# Patient Record
Sex: Male | Born: 1935 | Race: White | Hispanic: No | Smoking: Former smoker
Health system: Southern US, Community
[De-identification: ages and names within clinical notes are randomized; demographics above are authoritative.]

## PROBLEM LIST (undated history)

## (undated) DIAGNOSIS — R270 Ataxia, unspecified: Secondary | ICD-10-CM

## (undated) DIAGNOSIS — R634 Abnormal weight loss: Secondary | ICD-10-CM

## (undated) DIAGNOSIS — F0390 Unspecified dementia without behavioral disturbance: Secondary | ICD-10-CM

## (undated) DIAGNOSIS — I2699 Other pulmonary embolism without acute cor pulmonale: Secondary | ICD-10-CM

## (undated) DIAGNOSIS — F03A0 Unspecified dementia, mild, without behavioral disturbance, psychotic disturbance, mood disturbance, and anxiety: Secondary | ICD-10-CM

## (undated) DIAGNOSIS — H548 Legal blindness, as defined in USA: Secondary | ICD-10-CM

## (undated) DIAGNOSIS — I10 Essential (primary) hypertension: Secondary | ICD-10-CM

## (undated) DIAGNOSIS — Z7901 Long term (current) use of anticoagulants: Secondary | ICD-10-CM

## (undated) DIAGNOSIS — I639 Cerebral infarction, unspecified: Secondary | ICD-10-CM

## (undated) DIAGNOSIS — G629 Polyneuropathy, unspecified: Secondary | ICD-10-CM

## (undated) DIAGNOSIS — W19XXXA Unspecified fall, initial encounter: Secondary | ICD-10-CM

## (undated) DIAGNOSIS — R296 Repeated falls: Secondary | ICD-10-CM

## (undated) DIAGNOSIS — I82409 Acute embolism and thrombosis of unspecified deep veins of unspecified lower extremity: Secondary | ICD-10-CM

## (undated) DIAGNOSIS — K3184 Gastroparesis: Secondary | ICD-10-CM

## (undated) DIAGNOSIS — M810 Age-related osteoporosis without current pathological fracture: Secondary | ICD-10-CM

## (undated) HISTORY — DX: Gastroparesis: K31.84

## (undated) HISTORY — DX: Essential (primary) hypertension: I10

## (undated) HISTORY — DX: Abnormal weight loss: R63.4

## (undated) HISTORY — DX: Polyneuropathy, unspecified: G62.9

## (undated) HISTORY — DX: Age-related osteoporosis without current pathological fracture: M81.0

## (undated) HISTORY — PX: ANKLE SURGERY: SHX546

---

## 1982-12-02 HISTORY — PX: TRANSURETHRAL RESECTION OF PROSTATE: SHX73

## 2002-10-01 ENCOUNTER — Ambulatory Visit (HOSPITAL_COMMUNITY): Admission: RE | Admit: 2002-10-01 | Discharge: 2002-10-01 | Payer: Self-pay | Admitting: Family Medicine

## 2002-10-01 ENCOUNTER — Encounter: Payer: Self-pay | Admitting: Family Medicine

## 2004-03-02 ENCOUNTER — Ambulatory Visit (HOSPITAL_COMMUNITY): Admission: RE | Admit: 2004-03-02 | Discharge: 2004-03-02 | Payer: Self-pay | Admitting: Family Medicine

## 2005-08-08 ENCOUNTER — Other Ambulatory Visit: Admission: RE | Admit: 2005-08-08 | Discharge: 2005-08-08 | Payer: Self-pay | Admitting: Dermatology

## 2006-11-16 ENCOUNTER — Inpatient Hospital Stay (HOSPITAL_COMMUNITY): Admission: EM | Admit: 2006-11-16 | Discharge: 2006-11-20 | Payer: Self-pay | Admitting: Emergency Medicine

## 2006-11-18 ENCOUNTER — Ambulatory Visit: Payer: Self-pay | Admitting: Orthopedic Surgery

## 2006-12-02 HISTORY — PX: BACK SURGERY: SHX140

## 2006-12-04 ENCOUNTER — Ambulatory Visit: Payer: Self-pay | Admitting: Orthopedic Surgery

## 2006-12-25 ENCOUNTER — Ambulatory Visit: Payer: Self-pay | Admitting: Orthopedic Surgery

## 2007-01-15 ENCOUNTER — Ambulatory Visit: Payer: Self-pay | Admitting: Orthopedic Surgery

## 2007-01-27 ENCOUNTER — Ambulatory Visit: Payer: Self-pay | Admitting: Orthopedic Surgery

## 2007-02-03 ENCOUNTER — Encounter (HOSPITAL_COMMUNITY): Admission: RE | Admit: 2007-02-03 | Discharge: 2007-03-05 | Payer: Self-pay | Admitting: Orthopedic Surgery

## 2007-03-03 ENCOUNTER — Ambulatory Visit: Payer: Self-pay | Admitting: Orthopedic Surgery

## 2007-03-06 ENCOUNTER — Encounter (HOSPITAL_COMMUNITY): Admission: RE | Admit: 2007-03-06 | Discharge: 2007-04-05 | Payer: Self-pay | Admitting: Orthopedic Surgery

## 2007-03-11 ENCOUNTER — Ambulatory Visit (HOSPITAL_COMMUNITY): Admission: RE | Admit: 2007-03-11 | Discharge: 2007-03-11 | Payer: Self-pay | Admitting: Family Medicine

## 2007-04-14 ENCOUNTER — Ambulatory Visit: Payer: Self-pay | Admitting: Orthopedic Surgery

## 2007-11-19 ENCOUNTER — Encounter (HOSPITAL_COMMUNITY): Admission: RE | Admit: 2007-11-19 | Discharge: 2007-12-02 | Payer: Self-pay | Admitting: Family Medicine

## 2007-12-04 ENCOUNTER — Encounter (HOSPITAL_COMMUNITY): Admission: RE | Admit: 2007-12-04 | Discharge: 2008-01-03 | Payer: Self-pay | Admitting: Family Medicine

## 2008-04-01 ENCOUNTER — Ambulatory Visit (HOSPITAL_COMMUNITY): Admission: RE | Admit: 2008-04-01 | Discharge: 2008-04-01 | Payer: Self-pay | Admitting: Rheumatology

## 2008-05-03 ENCOUNTER — Encounter: Admission: RE | Admit: 2008-05-03 | Discharge: 2008-05-03 | Payer: Self-pay | Admitting: Rheumatology

## 2008-05-17 ENCOUNTER — Encounter: Admission: RE | Admit: 2008-05-17 | Discharge: 2008-05-17 | Payer: Self-pay | Admitting: Rheumatology

## 2008-05-28 ENCOUNTER — Emergency Department (HOSPITAL_COMMUNITY): Admission: EM | Admit: 2008-05-28 | Discharge: 2008-05-28 | Payer: Self-pay | Admitting: Emergency Medicine

## 2008-05-30 ENCOUNTER — Encounter: Admission: RE | Admit: 2008-05-30 | Discharge: 2008-05-30 | Payer: Self-pay | Admitting: Rheumatology

## 2008-06-07 ENCOUNTER — Ambulatory Visit (HOSPITAL_COMMUNITY): Admission: RE | Admit: 2008-06-07 | Discharge: 2008-06-07 | Payer: Self-pay | Admitting: Family Medicine

## 2008-09-13 ENCOUNTER — Inpatient Hospital Stay (HOSPITAL_COMMUNITY): Admission: RE | Admit: 2008-09-13 | Discharge: 2008-09-15 | Payer: Self-pay | Admitting: Neurological Surgery

## 2010-06-28 ENCOUNTER — Ambulatory Visit: Payer: Self-pay | Admitting: Otolaryngology

## 2010-07-04 ENCOUNTER — Ambulatory Visit (HOSPITAL_COMMUNITY): Admission: RE | Admit: 2010-07-04 | Discharge: 2010-07-04 | Payer: Self-pay | Admitting: Otolaryngology

## 2010-09-04 ENCOUNTER — Ambulatory Visit (HOSPITAL_COMMUNITY)
Admission: RE | Admit: 2010-09-04 | Discharge: 2010-09-04 | Payer: Self-pay | Source: Home / Self Care | Admitting: Neurological Surgery

## 2010-11-06 ENCOUNTER — Ambulatory Visit: Payer: Self-pay | Admitting: Internal Medicine

## 2010-12-02 DIAGNOSIS — I2699 Other pulmonary embolism without acute cor pulmonale: Secondary | ICD-10-CM

## 2010-12-02 HISTORY — DX: Other pulmonary embolism without acute cor pulmonale: I26.99

## 2010-12-05 ENCOUNTER — Ambulatory Visit: Admit: 2010-12-05 | Payer: Self-pay | Admitting: Internal Medicine

## 2010-12-05 ENCOUNTER — Ambulatory Visit (HOSPITAL_COMMUNITY)
Admission: RE | Admit: 2010-12-05 | Discharge: 2010-12-05 | Payer: Self-pay | Source: Home / Self Care | Attending: Internal Medicine | Admitting: Internal Medicine

## 2010-12-05 LAB — VITAMIN B12: Vitamin B-12: 382 pg/mL (ref 211–911)

## 2010-12-05 LAB — GLUCOSE, CAPILLARY
Glucose-Capillary: 319 mg/dL — ABNORMAL HIGH (ref 70–99)
Glucose-Capillary: 300 mg/dL — ABNORMAL HIGH (ref 70–99)

## 2010-12-06 ENCOUNTER — Encounter (HOSPITAL_COMMUNITY)
Admission: RE | Admit: 2010-12-06 | Discharge: 2011-01-01 | Payer: Self-pay | Source: Home / Self Care | Attending: Internal Medicine | Admitting: Internal Medicine

## 2010-12-06 LAB — H. PYLORI ANTIBODY, IGG: H Pylori IgG: <0.4 {ISR}

## 2010-12-18 ENCOUNTER — Inpatient Hospital Stay (HOSPITAL_COMMUNITY): Admission: EM | Admit: 2010-12-18 | Discharge: 2010-12-22 | Payer: Self-pay | Source: Home / Self Care

## 2010-12-19 LAB — CBC
HCT: 39.1 % (ref 39.0–52.0)
Hemoglobin: 13.4 g/dL (ref 13.0–17.0)
MCH: 33.2 pg (ref 26.0–34.0)
MCHC: 34.3 g/dL (ref 30.0–36.0)
MCV: 96.8 fL (ref 78.0–100.0)
Platelets: 386 10*3/uL (ref 150–400)
RBC: 4.04 MIL/uL — ABNORMAL LOW (ref 4.22–5.81)
RDW: 12.8 % (ref 11.5–15.5)
WBC: 7 10*3/uL (ref 4.0–10.5)

## 2010-12-19 LAB — DIFFERENTIAL
Basophils Absolute: 0.1 10*3/uL (ref 0.0–0.1)
Basophils Relative: 1 % (ref 0–1)
Eosinophils Absolute: 0.3 10*3/uL (ref 0.0–0.7)
Eosinophils Relative: 4 % (ref 0–5)
Lymphocytes Relative: 15 % (ref 12–46)
Lymphs Abs: 1.1 10*3/uL (ref 0.7–4.0)
Monocytes Absolute: 0.5 10*3/uL (ref 0.1–1.0)
Monocytes Relative: 7 % (ref 3–12)
Neutro Abs: 5.2 10*3/uL (ref 1.7–7.7)
Neutrophils Relative %: 74 % (ref 43–77)

## 2010-12-19 LAB — GLUCOSE, CAPILLARY: Glucose-Capillary: 475 mg/dL — ABNORMAL HIGH (ref 70–99)

## 2010-12-19 LAB — PSA: PSA: 0.39 ng/mL (ref <=4.00)

## 2010-12-19 LAB — BASIC METABOLIC PANEL
BUN: 32 mg/dL — ABNORMAL HIGH (ref 6–23)
CO2: 28 mEq/L (ref 19–32)
Calcium: 8.9 mg/dL (ref 8.4–10.5)
Chloride: 100 mEq/L (ref 96–112)
Creatinine, Ser: 1.47 mg/dL (ref 0.4–1.5)
GFR calc Af Amer: 55 mL/min — ABNORMAL LOW (ref >60)
GFR calc non Af Amer: 46 mL/min — ABNORMAL LOW (ref >60)
Glucose, Bld: 230 mg/dL — ABNORMAL HIGH (ref 70–99)
Potassium: 5.4 mEq/L — ABNORMAL HIGH (ref 3.5–5.1)
Sodium: 139 mEq/L (ref 135–145)

## 2010-12-19 LAB — PROTIME-INR
INR: 0.94 (ref 0.00–1.49)
Prothrombin Time: 12.8 seconds (ref 11.6–15.2)

## 2010-12-19 LAB — APTT: aPTT: 29 seconds (ref 24–37)

## 2010-12-24 LAB — CBC
HCT: 34.4 % — ABNORMAL LOW (ref 39.0–52.0)
Hemoglobin: 12.1 g/dL — ABNORMAL LOW (ref 13.0–17.0)
MCH: 33.5 pg (ref 26.0–34.0)
RBC: 3.61 MIL/uL — ABNORMAL LOW (ref 4.22–5.81)

## 2010-12-24 LAB — GLUCOSE, CAPILLARY
Glucose-Capillary: 286 mg/dL — ABNORMAL HIGH (ref 70–99)
Glucose-Capillary: 352 mg/dL — ABNORMAL HIGH (ref 70–99)
Glucose-Capillary: 276 mg/dL — ABNORMAL HIGH (ref 70–99)
Glucose-Capillary: 213 mg/dL — ABNORMAL HIGH (ref 70–99)

## 2010-12-24 LAB — COMPREHENSIVE METABOLIC PANEL
ALT: 16 U/L (ref 0–53)
AST: 21 U/L (ref 0–37)
CO2: 27 mEq/L (ref 19–32)
Chloride: 103 mEq/L (ref 96–112)
Creatinine, Ser: 1.14 mg/dL (ref 0.4–1.5)
GFR calc Af Amer: >60 mL/min (ref >60)
GFR calc non Af Amer: >60 mL/min (ref >60)
Sodium: 137 mEq/L (ref 135–145)
Total Bilirubin: 0.2 mg/dL — ABNORMAL LOW (ref 0.3–1.2)

## 2010-12-24 LAB — DIFFERENTIAL
Basophils Relative: 1 % (ref 0–1)
Lymphocytes Relative: 20 % (ref 12–46)
Monocytes Relative: 10 % (ref 3–12)
Neutro Abs: 3.4 10*3/uL (ref 1.7–7.7)
Neutrophils Relative %: 62 % (ref 43–77)

## 2010-12-24 LAB — PROTIME-INR
INR: 1.16 (ref 0.00–1.49)
Prothrombin Time: 15 seconds (ref 11.6–15.2)

## 2010-12-24 NOTE — H&P (Addendum)
NAME:  Eduardo Lee, ARMIJO NO.:  [id number]  MEDICAL RECORD NO.:  [id number]          PATIENT TYPE:  OBV  LOCATION:  A203                          FACILITY:  [facility]  PHYSICIAN:  Tarry Kos, MD       DATE OF BIRTH:  Jan 03, 1926  DATE OF ADMISSION:  12/18/2010 DATE OF DISCHARGE:  LH                             HISTORY & PHYSICAL   CHIEF COMPLAINT:  Right lower extremity swelling for over a week.  HISTORY OF PRESENT ILLNESS:  Mr. Eduardo Lee is a pleasant 77 year old male who presents to the emergency room from his podiatrist office because of right lower extremity swelling that has been going on for over a week. It has been nonpainful.  He was in his podiatrist office today for debridement of right big toe wound and his podiatrist felt it was prudent to rule out for DVT and his ultrasound was positive for DVT. Final report is pending.  He never had a DVT or PE before in the past. He denies any recent traveling.  He did have a colonoscopy and EGD within the last month or so and had polypectomies done and I am looking at the results of the pathology of those 3 polypectomies and none of them had any malignancy identified.  He states he also gets his prostate screened routinely.  He is a nonsmoker.  He has not recently had any surgery and he has not recently had any trauma to his leg and again has not had any history of VTE in the past.  Denies any chest pain.  Denies any shortness of breath and again denies any leg pain.  He has been having swelling.  REVIEW OF SYSTEMS:  Otherwise negative.  PAST MEDICAL HISTORY:  Again recent EGD and colonoscopy with 3 polypectomies that were negative for malignancy.  He has had a left ankle fracture in the past, TURP in 1984, history of bladder outlet obstruction, history of fracture right tib-fib, insulin-dependent diabetes, peripheral neuropathy from his diabetes, history of needing esophageal dilation, history of erosive esophagitis,  sounds like he has recently been diagnosed with possible gastroparesis.  MEDICATIONS: 1. He takes Zetia 10 mg a day. 2. Domperidone 1 tablet 3 times a day. 3. Simvastatin 20 mg a day. 4. Vitamin D 50,000 units weekly. 5. Aleve orally as needed. 6. NovoLog sliding scale insulin. 7. Lantus 10 units subcu nightly. 8. Latanoprost ophthalmic drops in each eye once a day. 9. MiraLax daily.  SOCIAL HISTORY:  He is a nonsmoker, does not drink.  No alcohol use.  He lives alone but he does have a friend who is a woman that appears to be taking really good care of him.  They have been very good friends for 21 years and she moved in recently with his medical issues to help take care of him.  PAST FAMILY HISTORY:  Noncontributory.  PHYSICAL EXAMINATION:  VITAL SIGNS:  His temperature is 97.5, blood pressure 119/67, pulse 87, respirations 16, 100% O2 on room air. GENERAL:  Alert and oriented x4.  No apparent distress. HEART:  Regular rate and rhythm without murmurs, rubs or gallops.  CHEST:  Clear to auscultation bilaterally.  No wheezing, rhonchi, or rales. ABDOMEN:  Soft, nontender, nondistended.  Positive bowel sounds.  No hepatosplenomegaly. EXTREMITIES:  No clubbing cyanosis.  He has got significant swelling in his right lower extremity to the midcalf about twice the size of his left lower extremity.  His right great toe is wrapped.  Clean, dry and intact dressing.  Pulses are intact.  No signs of  erythema bilaterally or cellulitis. NEURO:  No focal neurological deficits. PSYCH:  Normal mood and affect.  LABORATORY FINDINGS:  His INR is 0.9, hemoglobin is 13.4, white count is normal, creatinine is 1.47.  Glucose is 230.  Final lower extremity Doppler is pending.  ASSESSMENT AND PLAN:  This is an 77 year old male with an acute right lower extremity deep vein thrombosis which appears to be unprovoked 1. Unprovoked right lower extremity deep vein thrombosis.  We will     place him  on Lovenox 1 mg/kg subcu q.12 h. and load him with     Coumadin 10 mg today and then 5 mg daily.  Obtain daily INR checks.     Obtain warfarin and Lovenox education and social work to arrange     for him to get his home Lovenox shots.  He will probably need at     least 7 days of that.  We will monitor him closely for the next 24     hours and depending on how he does, he will probably be able to be     discharged tomorrow or within 48 hours.  We will place him in     observation status.  Concern would be to make sure he has all of     his routine preventative cancer screening.  I am going to send off     for a PSA again.  Again, I already looked at this pathology from     his recent colonoscopy and none of those were positive for any     malignancy.  He is a nonsmoker, so lung cancer would be low in the     differential but I am going to check Arben Packman and lateral chest x-ray.     He will need 3-6 months of Coumadin treatment.  I have gone over     the risks and benefits of starting Coumadin.  He is aware that he     is at increased risk of bleeding.  He is aware in the future if he     have any melanotic stools, bright blood per rectum, vomiting blood     or any major trauma that he is to seek medical attention as long as     he is on Coumadin. 2. Insulin-dependent diabetes.  Continue his medications 3. Further recommendations depending on overall hospital course.                                           ______________________________ Tarry Kos, MD     RD/MEDQ  D:  12/18/2010  T:  12/19/2010  Job:  [id number]  Electronically Signed by Eldridge Dace MD on 12/24/2010 02:08:09 PM

## 2010-12-24 NOTE — Discharge Summary (Addendum)
NAME:  Eduardo Lee, BOLLARD NO.:  [id number]  MEDICAL RECORD NO.:  [id number]          PATIENT TYPE:  INP  LOCATION:  A203                          FACILITY:  [facility]  PHYSICIAN:  Wilson Singer, M.D.DATE OF BIRTH:  11/05/1926  DATE OF ADMISSION:  12/18/2010 DATE OF DISCHARGE:  01/21/2012LH                              DISCHARGE SUMMARY   CONDITION ON DISCHARGE:  Stable.  MEDICATIONS ON DISCHARGE: 1. Amoxicillin 500 mg twice a day. 2. Silver sulfadiazine cream to apply topically daily. 3. Warfarin 5 mg daily and adjust according to INR. 4. Aleve 220 mg daily p.r.n. 5. Domperidone 1 tablet daily 3 times a day before meals. 6. Lantus insulin 10 units at bedtime. 7. Latanoprost ophthalmic eyedrops 1 drop in both eyes daily. 8. MiraLax 2 scoops by mouth daily. 9. NovoLog insulin 5-7 units subcutaneously q.i.d. a.c. 10.Simvastatin 20 mg daily. 11.Vitamin D 50,000 units by mouth once a week. 12.Zetia 10 mg daily. FINAL DISCHARGE DIAGNOSES: 1. Right leg deep vein thrombosis. 2. Left lung pulmonary embolism. 3. Insulin-dependent diabetes mellitus with peripheral neuropathy. 4. History of erosive esophagitis, stable.  HISTORY:  This very pleasant 77 year old man was admitted to the hospital, complaining of right lower extremity swelling for over 1 week. He knows that he has been fairly sedentary and immobile since he had surgery on his foot for right big toe debridement.  Please see initial history and physical examination done by Dr. Onalee Hua.  HOSPITAL PROGRESS:  The patient was admitted appropriately and in view of the DVT, he was started on anticoagulation with Lovenox.  It was interesting that during his hospitalization, a chest x-ray was shown to be abnormal with a questionable mass versus confluence of shadows in the medial aspect of left upper zone.  This was then followed by a CT chest with contrast which actually showed that he has sustained a  large pulmonary embolism in the left pulmonary artery extending to the lower lobe.  There was no evidence of malignancy and the CT chest scan also looked at the upper abdomen which showed no evidence of any malignant process.  The patient remained stable throughout his hospital stay, not requiring any oxygen and denies any symptoms of cough, dyspnea, or hemoptysis.  On the day of discharge after he had been started on warfarin, his INR was therapeutic at 2.0.  PHYSICAL EXAMINATION:  VITAL SIGNS:  On the day of discharge, temperature 98.4, blood pressure 105/63, pulse 78 in sinus rhythm, saturation 93% on room air. HEART:  Heart sounds are present and normal without murmurs. CHEST:  Lung fields are entirely clear with no pleural rub, crackles, or wheezes.  There is no evidence of neck or supraclavicular or axillary lymphadenopathy. ABDOMEN:  Soft and nontender with no masses felt and no hepatomegaly. NEUROLOGIC:  He is alert and oriented without any focal neurologic signs.  DISPOSITION:  The patient is stable to be discharged home but he must follow up with primary care physician in the next 2-3 days and continue on warfarin 5 mg daily.  He will need to have his INR checked on a regular basis until it  become stable.  In my opinion, he will required to be on the warfarin for at least 6 months in view of the pulmonary embolism.     Wilson Singer, M.D.     NCG/MEDQ  D:  12/22/2010  T:  12/22/2010  Job:  [id number]  cc:   Donna Bernard, M.D. Fax: [phone]  Electronically Signed by Lilly Cove M.D. on 12/24/2010 12:55:43 PM

## 2010-12-25 LAB — GLUCOSE, CAPILLARY

## 2011-01-02 HISTORY — PX: UPPER GASTROINTESTINAL ENDOSCOPY: SHX188

## 2011-01-02 HISTORY — PX: COLONOSCOPY: SHX174

## 2011-01-22 ENCOUNTER — Ambulatory Visit (INDEPENDENT_AMBULATORY_CARE_PROVIDER_SITE_OTHER): Payer: Medicare Other | Admitting: Internal Medicine

## 2011-01-22 DIAGNOSIS — K3189 Other diseases of stomach and duodenum: Secondary | ICD-10-CM

## 2011-01-22 DIAGNOSIS — R1310 Dysphagia, unspecified: Secondary | ICD-10-CM

## 2011-01-22 DIAGNOSIS — R634 Abnormal weight loss: Secondary | ICD-10-CM

## 2011-02-14 LAB — CREATININE, SERUM: GFR calc Af Amer: >60 mL/min (ref >60)

## 2011-02-15 LAB — CREATININE, SERUM
Creatinine, Ser: 1.31 mg/dL (ref 0.4–1.5)
GFR calc Af Amer: >60 mL/min (ref >60)

## 2011-04-04 ENCOUNTER — Encounter: Payer: Medicare Other | Admitting: Occupational Therapy

## 2011-04-08 ENCOUNTER — Ambulatory Visit (INDEPENDENT_AMBULATORY_CARE_PROVIDER_SITE_OTHER): Payer: Medicare Other | Admitting: Internal Medicine

## 2011-04-08 DIAGNOSIS — R634 Abnormal weight loss: Secondary | ICD-10-CM

## 2011-04-08 DIAGNOSIS — K3184 Gastroparesis: Secondary | ICD-10-CM

## 2011-04-08 DIAGNOSIS — E119 Type 2 diabetes mellitus without complications: Secondary | ICD-10-CM

## 2011-04-16 NOTE — Op Note (Signed)
NAME:  TREGAN, READ NO.:  [id number]   MEDICAL RECORD NO.:  [id number]          PATIENT TYPE:  INP   LOCATION:  3023                         FACILITY:  [facility]   PHYSICIAN:  Stefani Dama, M.D.  DATE OF BIRTH:  October 15, 1926   DATE OF PROCEDURE:  09/13/2008  DATE OF DISCHARGE:                               OPERATIVE REPORT   PREOPERATIVE DIAGNOSES:  Lumbar spinal stenosis L2-3, L3-4, and L4-5  with bilateral lumbar radiculopathies and neurogenic claudication.   POSTOPERATIVE DIAGNOSES:  Lumbar spinal stenosis L2-3, L3-4, and L4-5  with bilateral lumbar radiculopathies and neurogenic claudication.   PROCEDURES:  Lumbar laminectomy L2, L3, and L4; decompression of L2, L3,  L4, and L5 nerve roots with operating microscope microdissection  technique.   SURGEON:  Stefani Dama, MD   FIRST ASSISTANT:  Danae Orleans. Venetia Maxon, MD   ANESTHESIA:  General endotracheal.   INDICATIONS:  Mr. Eduardo Lee is an 77 year old individual who has had  significant problems with back and bilateral lower extremity pain and  evidence of severe spinal stenosis at the levels of L2-3, L3-4, and L4-5  with both central and lateral recess stenosis, and has had previous  fractures at L1, L2, and L3, and this has accentuated the problem with  the spinal stenosis.  He is taken to the operating room to undergo  surgical decompression.   PROCEDURE:  The patient was brought to the operating room supine on the  stretcher.  After smooth induction of general endotracheal anesthesia,  Foley catheter was placed.  He was placed into a prone position.  Care  was taken to carefully protect any bony prominences and pad  appropriately.  The back was then prepped with alcohol and DuraPrep and  draped in sterile fashion.  Midline incision was created and carried  down to lumbodorsal fascia, which was opened on either side in midline  to expose the spinous processes from the base of L1 down to the top of  L5.  Localizing radiographs identified L1 and L2 positively and then by  dissecting the subperiosteal tissues, entire interlaminar space out to  the medial wall of the facet was exposed from L2 down to L4.  Spinous  processes of L3 and L4 were then removed completely.  At L2, the  inferior margin of the spinous process was trimmed and then a  laminectomy was completed using a high-speed bur, and a 5-mm bit to thin  the remnants of the laminar arches and ultimately to remove that along  with substantial redundant yellow ligament at the interspaces of L2-3  and L3-4 and at L4-L5.  Ultimately, the entirety of the laminas were  removed at L3 and L4.  L2 was undercut so that just a remnant of the  spinous process and laminar arch was left so as to allow for attachment  of L1 and L2.  The yellow ligament there was allowed to remain intact.  L2 was undercut laterally to expose the takeoff of the L2 nerve root.  Then, with the use of the operating microscope, microdissection  technique was used to decompress  laterally.  Once the central portion of  the spinal canal was exposed from the base of L2 down to the top of L5,  L5 was undercut a moderate degree to allow exposure and allowed a sound  to be passed under the L5 nerve root foramen.  In the lateral gutters,  then yellow ligament was taken up and it was noted be substantially  stenosing at the levels of L2-3, at the level of L3-4 compressing the L3  nerve root at L2-3, and the L4 nerve root at L3-4.  With the yellow  ligament being taken out, the lateral recesses was decompressed.  Each  of the nerve roots L2, L3, L4, and L5 could be sounded first on one  side, and a similar procedure was carried out on the opposite side.  Once the nerve roots were decompressed, hemostasis from the epidural  bleeding veins was obtained with some bipolar cautery and some carefully  placed Gelfoam pledgets, which were later irrigated away.  Once  hemostasis was  well established, decompression was checked.  No spinal  fluid leaks were noted.  The retractors were removed.  The microscope  was removed, and then the lumbodorsal fascia was closed with #1 Vicryl  in interrupted fashion, 2-0 Vicryl was used in subcutaneous tissues, and  3-0 Vicryl was used to close the subcuticular tissues.  A dry sterile  dressing was placed on the skin.  Blood loss is estimated 150 mL.      Stefani Dama, M.D.  Electronically Signed     HJE/MEDQ  D:  09/13/2008  T:  09/14/2008  Job:  [id number]

## 2011-04-19 NOTE — Consult Note (Signed)
NAME:  Eduardo Lee, Eduardo Lee NO.:  [id number]   MEDICAL RECORD NO.:  [id number]          PATIENT TYPE:  INP   LOCATION:  A202                          FACILITY:  [facility]   PHYSICIAN:  Vickki Hearing, M.D.DATE OF BIRTH:  [birth year]   DATE OF CONSULTATION:  11/17/2006  DATE OF DISCHARGE:                                 CONSULTATION   REASON FOR CONSULTATION:  Lumbar compression fracture, right lower  extremity tibia and fibular fracture.   HISTORY:  This is a 77 year old male.  He is a brittle diabetic who has  neuropathy, retinopathy, hypertension, legal blindness.  He presented  after getting dizzy and falling and fracturing his right lower  extremity.  He is also thought to have a lumbar compression fracture at  L1 and/or L3.  The patient was nauseous, had some diarrhea, then became  dizzy, fell and fractured his leg and his back.  His glucose apparently  was low at the time.  He was brought to the emergency room for  evaluation.  He had an elevated sugar at that time.  He was worked up  with a CT scan of his head which was noted to have no acute findings,  some mild atrophy, thalamic lacunar infarction which was old, and  scattered sinus disease.   MEDICATIONS:  1. Aspirin.  2. Lipitor.  3. Hydrochlorothiazide.  4. Zetia.  5. Levemir.  6. Glycolax.  7. NovoLog.   PAST SURGICAL HISTORY:  1. Surgery on his left ankle.  2. Had a TURP in 1984.   FAMILY HISTORY:  Diabetes and stroke.   ALLERGIES:  No known allergies.   SOCIAL HISTORY:  No tobacco abuse.  Will have some scotch as an  alcoholic beverage.   REVIEW OF SYSTEMS:  Otherwise negative.   PHYSICAL EXAMINATION:  VITAL SIGNS:  Stable as recorded in the medical  record.  APPEARANCE:  He is a thin male, well-developed and nourished.  Grooming  and hygiene acceptable.  CARDIOVASCULAR:  Good peripheral pulses.  No swelling or edema.  LYMPHATICS:  His lymph nodes are benign.  NEUROLOGIC:  No focal  findings.  He is alert, awake and oriented x3.  His mood and affect is pleasant and flattened.  EXTREMITIES:  His upper extremities show normal strength.  No  contractures, subluxation, atrophy or tremors.  Left lower extremity is  the same.  His right lower extremities is in a splint.  There is no  deformity.   RADIOGRAPHS:  A nondisplaced tibia shaft fracture proximally with a  proximal fibular fracture and a distal fibular fracture.  They are all  nondisplaced.   His lumbar spine films show what appears to be an L1 and L3 compression  fracture; question whether they are new or old.   PLAN:  Long-leg cast for his right lower extremity.  His lumbar spine  was tender more at L3 than L1.  If necessary for ambulation, will put  him in a brace.  He will have to be nonweightbearing for at least 4  weeks, maybe even 6, so I will apply a long-leg cast, and  then we will  start physical therapy to see how much walking he can do  nonweightbearing.      Vickki Hearing, M.D.  Electronically Signed     SEH/MEDQ  D:  11/18/2006  T:  11/18/2006  Job:  [id number]

## 2011-04-19 NOTE — Discharge Summary (Signed)
NAME:  Eduardo Lee, Eduardo Lee NO.:  [id number]   MEDICAL RECORD NO.:  [id number]          PATIENT TYPE:  INP   LOCATION:  A202                          FACILITY:  [facility]   PHYSICIAN:  Scott A. Gerda Diss, MD    DATE OF BIRTH:  07/14/1926   DATE OF ADMISSION:  11/16/2006  DATE OF DISCHARGE:  12/20/2007LH                               DISCHARGE SUMMARY   DISCHARGE DIAGNOSES:  1. Syncope.  2. Gastroenteritis as the cause of #3.  3. Mild dehydration - which caused #1.  4. Bladder outlet obstruction, now currently on Flomax.  5. Fracture of right tibia-fibula.  6. Diabetes - difficult to control.   HOSPITAL COURSE:  This 77 year old white male has a significant history  of brittle diabetes followed by endocrinology in [place], I believe  it is Dr. Leslie Dales.  He also has diabetic neuropathy, diabetic  retinopathy, hypertension and legal blindness.  He presented to the  emergency department with passing out and pain and discomfort on the  right leg.  He also, about 15-18 hours prior to admission, had nausea,  multiple episodes of vomiting and diarrhea, and now is felt to be the  principal course of his syncope.  That occurred at his son's house when  he injured his leg, and was admitted in primarily because of the  fractured leg and inability to care for himself, along with multiple  other problems, as listed above.  He was treated with a Foley catheter  for a couple days, started on Flomax, was able to tolerate that  relatively well, and the Foley catheter was taken out on November 19, 2006, and he was able to urinate without difficulty.  His glucoses have  been running moderately elevated, despite diabetic diet and following  his regimen.  It is felt the stress of the injury is contributing to  this.  In addition to this, the patient was seen by Dr. Romeo Apple, who,  on November 18, 2006, brought him to the OR, and he was seen by Dr.  Romeo Apple there and had an application of  a long cast from toe to mid  thigh.  Radiographs were taken in the operating room and showed  acceptable alignment of the right tibial, and the fibula remained non-  displaced, as well, and he was sent back to the recovery room, and it is  felt that he was going to have to be nonweightbearing on his right leg  for approximately 4-6 weeks.   The patient, on November 20, 2006, had a room placement for ongoing  care, and it was felt stable for the patient to be discharged to the  care of that facility.  It should be noted that he can go back on his  home regimen of Levemir 7 units at bedtime and a sliding scale NovoLog  before each meal.  It should be noted here in the hospital the sliding  scale that we were using was the diabetes protocol, which, if it was 60-  100 was 0 units, 101-150 was 3 units, 151-200 was 4 units, 201-250 was 7  units of NovoLog, 251-300 was 9 units, 301 to 350 was 12 units, greater  than 350 was 15 units.  It should also be noted that he was on the  Lantus 12 units q.h.s.  So, therefore, based on the following, I would  make these recommendations:   DISCHARGE MEDICATIONS:  1. Lantus 12 units subcutaneous q.h.s.  2. Sliding scale as detailed per above.  3. To adjust these parameters every few days based on his readings of      q.a.c. and q.h.s. glucoses.  4. To continue Lovenox 40 mg subcutaneous daily.  5. Continue Zetia 10 mg daily.  6. Vicodin one q.4h. p.r.n. pain.  7. Zocor 20 mg daily.  8. Flomax 0.4 mg daily.  9. Coumadin 4 mg daily.   I would highly recommend checking a PT/INR on a daily basis until the  INR is in the range of 2.0-2.5, then at that point in time the Lovenox  could be stopped.  In addition to that, I would follow an INR beyond  that on a very frequent and regular basis so if the INR goes above 2.5,  the dose of the Coumadin is backed off.  It is felt that this gentleman  is at a higher risk of a DVT because of a long leg cast,  immobility and  elderly age.  Certainly, there is a risk of bleeds that occur with  Coumadin, and that was discussed with the patient, but the risk of  stroke outweighed the risk of bleeds.  I, once again, recommend close  following of the PT/INR as an outpatient in the treatment facility, and  it will be incumbent upon the treatment facility to monitor this closely  per their stated protocols.  Dr. Romeo Apple will follow up the patient  accordingly, and, at the time of discharge, did not indicate  specifically when he wanted to see the patient again.  I would highly  recommend the treatment facility to discuss that with Dr. Romeo Apple to  schedule a followup appointment.  Also, too, for right now, the patient  is nonweightbearing on the right leg, but does need physical therapy and  does need to get up on a regular basis.  I would also recommend close  monitoring to avoid any type of sacral sores because of the patient's  neuropathy, and because of his immobility, he is at high risk of bed  sores.  I would also recommend a diabetic diet.   Our office will be happy to assist with any background information on  the patient's medical healthy, but the day-to-day medical management of  this patient's problems will be assumed by the treating physician at the  rehabilitation facility, and we will be happy to re-assume his medical  management once the patient is discharged from there.      Scott A. Gerda Diss, MD  Electronically Signed     SAL/MEDQ  D:  11/20/2006  T:  11/20/2006  Job:  [id number]

## 2011-04-19 NOTE — H&P (Signed)
NAME:  Eduardo Lee, Eduardo Lee NO.:  [id number]   MEDICAL RECORD NO.:  [id number]          PATIENT TYPE:  INP   LOCATION:  A202                          FACILITY:  [facility]   PHYSICIAN:  Donna Bernard, M.D.DATE OF BIRTH:  [birth year]   DATE OF ADMISSION:  11/16/2006  DATE OF DISCHARGE:  LH                              HISTORY & PHYSICAL   CHIEF COMPLAINT:  Passing out spell, broken leg, weakness, vomiting.   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  Approximately 15-  18 hours prior to admission, the patient started to develop nausea.  He  had multiple episodes of vomiting, multiple episodes of diarrhea.  He  became more lightheaded with this.  He actually nearly collapsed in  church Sunday morning.  It was felt to be low sugar at the time, he was  given sugar and seemed to be feeling a little bit better.  The patient  then was at his son's house in the early afternoon, got up to go to the  bathroom and passed out.  When he collapsed, he fractured his leg in  multiple places.  The patient is complaining of right leg pain and a  little bit of low back pain.  He claims compliance with his current  medications which include the following.   CURRENT MEDICATIONS:  1. Aspirin 81 mg daily.  2. Lipitor 10 mg q.h.s.  3. Hydrochlorothiazide 12.5 q.a.m.  4. Zetia 10 mg daily.  5. Sliding scale with NovoLog 10 mg before each meal.  6. Levemir 7 units at bedtime.  7. The patient also uses Glycolax one capful daily.   PAST SURGERIES:  1. Remote left ankle fracture.  2. TURP in 1984.   FAMILY HISTORY:  Positive for diabetes and stroke.   ALLERGIES:  None known.   SOCIAL HISTORY:  The patient is retired, and he is divorced.  No tobacco  abuse.  He does  drink some alcohol, generally scotch.   REVIEW OF SYSTEMS:  Otherwise negative.   PHYSICAL EXAMINATION:  VITAL SIGNS:  Blood pressure 101/70, afebrile,  pulse 90.  GENERAL:  The patient is alert, in no acute distress.  HEENT:  Vision clearly diminished.  Pharynx slightly dry.  NECK:  Supple.  LUNGS:  Clear.  HEART:  Regular rate and rhythm.  ABDOMEN:  No significant tenderness.  BACK:  Some pain to percussion in the low back, but relatively mild in  regards to x-ray results.  EXTREMITIES:  Right leg is splinted and somewhat painful to palpation.  Pulses diminished but present.  Sensation diminished in the feet.   LABORATORY DATA:  Significant labs included white blood count 5.7,  hemoglobin 12 with an MCV of 109.  Sodium 138, potassium 5.7, chloride  109, bicarbonate 21, BUN 47, creatinine 1.6.  Lumbar spine shows L1 and  L3 compression fractures which may be new or old.  Right  leg shows  nondisplaced tibial shaft fracture and proximal and distal fibular  fractures.   IMPRESSION:  1. Gastroenteritis with volume contraction and borderline hypotension      with syncope.  2. Renal insufficiency likely secondary to #1, although potential for      outlet obstruction is there with history of prostate surgery      remotely.  3. Tubular and fibular fracture.  4. Compression fractures, new or old.  5. Adenopathy.  6. Neuropathy.  7. Brittle type 2 diabetes.  8. Hypertension.  9. Macrocytic anemia.   PLAN:  As per orders.      Donna Bernard, M.D.  Electronically Signed     WSL/MEDQ  D:  11/17/2006  T:  11/17/2006  Job:  [id number]

## 2011-04-19 NOTE — Op Note (Signed)
NAME:  JAQUAVIOUS, MERCER NO.:  [id number]   MEDICAL RECORD NO.:  [id number]          PATIENT TYPE:  INP   LOCATION:  A202                          FACILITY:  [facility]   PHYSICIAN:  Vickki Hearing, M.D.DATE OF BIRTH:  1926/02/15   DATE OF PROCEDURE:  11/18/2006  DATE OF DISCHARGE:                               OPERATIVE REPORT   PREOPERATIVE DIAGNOSIS:  Fracture right proximal tibia and fibula,  distal fibular fracture, as well, right leg.   POSTOPERATIVE DIAGNOSIS:  Fracture right proximal tibia and fibula,  distal fibular fracture, as well, right leg.   PROCEDURE:  Application of long leg cast, right leg.   SURGEON:  Vickki Hearing, M.D.   ASSISTANT:  None.   ANESTHESIA:  IV Versed 2 mg.   The patient was identified as Adria Dill.  The right leg was marked  for surgery, countersigned by the surgeon. The patient was taken to the  operating room for application of long leg cast. After checking vital  signs, confirming the procedure via time-out, we proceeded to apply a  long leg cast from toe to mid thigh.  Radiographs were taken in the  operating room and show alignment of the fracture is acceptable on the  right tibia.  The fibular remains nondisplaced, as well.   The patient will be sent back to the recovery room and then his regular  room.  He will be non-weight bearing on his right leg, approximate time  4-6 weeks.      Vickki Hearing, M.D.  Electronically Signed     SEH/MEDQ  D:  11/18/2006  T:  11/18/2006  Job:  [id number]

## 2011-08-01 ENCOUNTER — Encounter (INDEPENDENT_AMBULATORY_CARE_PROVIDER_SITE_OTHER): Payer: Self-pay | Admitting: *Deleted

## 2011-08-03 HISTORY — PX: CARPAL TUNNEL RELEASE: SHX101

## 2011-09-03 LAB — GLUCOSE, CAPILLARY
Glucose-Capillary: 141 — ABNORMAL HIGH
Glucose-Capillary: 168 — ABNORMAL HIGH
Glucose-Capillary: 182 — ABNORMAL HIGH
Glucose-Capillary: 191 — ABNORMAL HIGH
Glucose-Capillary: 243 — ABNORMAL HIGH
Glucose-Capillary: 248 — ABNORMAL HIGH
Glucose-Capillary: 298 — ABNORMAL HIGH

## 2011-09-03 LAB — BASIC METABOLIC PANEL
BUN: 22
CO2: 23
CO2: 27
Calcium: 8.6
Calcium: 8.7
Calcium: 9.5
Creatinine, Ser: 1.63 — ABNORMAL HIGH
GFR calc Af Amer: >60
GFR calc Af Amer: >60
GFR calc non Af Amer: 52 — ABNORMAL LOW
GFR calc non Af Amer: >60
Glucose, Bld: 282 — ABNORMAL HIGH
Glucose, Bld: 317 — ABNORMAL HIGH
Potassium: 4.6
Sodium: 135
Sodium: 138

## 2011-09-03 LAB — POCT I-STAT GLUCOSE
Glucose, Bld: 264 — ABNORMAL HIGH
Operator id: 238831

## 2011-09-03 LAB — CBC
HCT: 41.8
Hemoglobin: 13.9
RBC: 4.24

## 2011-09-12 ENCOUNTER — Encounter (INDEPENDENT_AMBULATORY_CARE_PROVIDER_SITE_OTHER): Payer: Self-pay | Admitting: *Deleted

## 2011-10-07 ENCOUNTER — Ambulatory Visit (INDEPENDENT_AMBULATORY_CARE_PROVIDER_SITE_OTHER): Payer: Medicare Other | Admitting: Internal Medicine

## 2011-10-28 ENCOUNTER — Ambulatory Visit (INDEPENDENT_AMBULATORY_CARE_PROVIDER_SITE_OTHER): Payer: Medicare Other | Admitting: Internal Medicine

## 2011-10-28 ENCOUNTER — Encounter (INDEPENDENT_AMBULATORY_CARE_PROVIDER_SITE_OTHER): Payer: Self-pay | Admitting: Internal Medicine

## 2011-10-28 VITALS — BP 110/70 | HR 74 | Temp 97.5°F | Resp 14 | Ht 69.0 in | Wt 138.0 lb

## 2011-10-28 DIAGNOSIS — R634 Abnormal weight loss: Secondary | ICD-10-CM

## 2011-10-28 DIAGNOSIS — E1149 Type 2 diabetes mellitus with other diabetic neurological complication: Secondary | ICD-10-CM

## 2011-10-28 DIAGNOSIS — K3184 Gastroparesis: Secondary | ICD-10-CM

## 2011-10-28 NOTE — Progress Notes (Signed)
Presenting complaint; Follow for gastroparesis and weight loss. Subjective: Eduardo Lee is 77 year old Caucasian male who is in for scheduled visit for gastroparesis and weight loss. He states he is feeling much better than he was 6 months ago. He has gained 10 pounds since he was begun on domperidone. He has good appetite he denies heartburn nausea, vomiting, abdominal pain. His bowels move regularly. He is experiencing no side effects with domperidone.his goal is to get up 140 pounds. Current Medications: Reviewed and updated.  Objective: BP 110/70  Pulse 74  Temp(Src) 97.5 F (36.4 C) (Oral)  Resp 14  Ht 5\' 9"  (1.753 m)  Wt 138 lb (62.596 kg)  BMI 20.38 kg/m2 Conjunctiva is pink. Sclera is nonicteric. Oropharyngeal mucosa is normal. No neck masses or thyromegaly noted. Abdomen is soft and nontender without organomegaly or masses. No LE edema or clubbing noted.  Assessment: Patient is doing very well with therapy for gastroparesis. Anorexia early satiety and weight loss has reversed. He appears to be close to his baseline. He has few doses of domperidone. He is not candidate for Reglan. It remains to be seen if he can go back on domperidone.   Plan: Drop domperidone is to 5 mg every morning. Will notify patient when domperidone prescription can be written. I did talk with patient's son who is accompanying him today.

## 2011-10-28 NOTE — Patient Instructions (Addendum)
Decrease domperidone dose to 5 mg daily before breakfast. Office will let you know when I am able to write prescription for domperidone as an investigational new drug.

## 2011-10-29 ENCOUNTER — Encounter (HOSPITAL_COMMUNITY): Payer: Medicare Other

## 2011-10-29 ENCOUNTER — Inpatient Hospital Stay (HOSPITAL_COMMUNITY): Payer: Medicare Other

## 2011-10-29 ENCOUNTER — Emergency Department (HOSPITAL_COMMUNITY): Payer: Medicare Other

## 2011-10-29 ENCOUNTER — Inpatient Hospital Stay (HOSPITAL_COMMUNITY)
Admission: EM | Admit: 2011-10-29 | Discharge: 2011-11-04 | DRG: 480 | Disposition: A | Discharge status: Skilled Nursing Facility | Payer: Medicare Other | Attending: Internal Medicine | Admitting: Internal Medicine

## 2011-10-29 ENCOUNTER — Encounter (HOSPITAL_COMMUNITY): Payer: Self-pay

## 2011-10-29 DIAGNOSIS — IMO0002 Reserved for concepts with insufficient information to code with codable children: Secondary | ICD-10-CM | POA: Diagnosis present

## 2011-10-29 DIAGNOSIS — R634 Abnormal weight loss: Secondary | ICD-10-CM | POA: Insufficient documentation

## 2011-10-29 DIAGNOSIS — S72001A Fracture of unspecified part of neck of right femur, initial encounter for closed fracture: Secondary | ICD-10-CM | POA: Diagnosis present

## 2011-10-29 DIAGNOSIS — K3184 Gastroparesis: Secondary | ICD-10-CM

## 2011-10-29 DIAGNOSIS — R739 Hyperglycemia, unspecified: Secondary | ICD-10-CM

## 2011-10-29 DIAGNOSIS — E1165 Type 2 diabetes mellitus with hyperglycemia: Secondary | ICD-10-CM

## 2011-10-29 DIAGNOSIS — E1143 Type 2 diabetes mellitus with diabetic autonomic (poly)neuropathy: Secondary | ICD-10-CM

## 2011-10-29 DIAGNOSIS — S72143A Displaced intertrochanteric fracture of unspecified femur, initial encounter for closed fracture: Principal | ICD-10-CM | POA: Diagnosis present

## 2011-10-29 DIAGNOSIS — D72829 Elevated white blood cell count, unspecified: Secondary | ICD-10-CM | POA: Diagnosis present

## 2011-10-29 DIAGNOSIS — Z86718 Personal history of other venous thrombosis and embolism: Secondary | ICD-10-CM

## 2011-10-29 DIAGNOSIS — E878 Other disorders of electrolyte and fluid balance, not elsewhere classified: Secondary | ICD-10-CM | POA: Diagnosis present

## 2011-10-29 DIAGNOSIS — W010XXA Fall on same level from slipping, tripping and stumbling without subsequent striking against object, initial encounter: Secondary | ICD-10-CM | POA: Diagnosis present

## 2011-10-29 DIAGNOSIS — N179 Acute kidney failure, unspecified: Secondary | ICD-10-CM | POA: Diagnosis present

## 2011-10-29 DIAGNOSIS — E875 Hyperkalemia: Secondary | ICD-10-CM | POA: Diagnosis present

## 2011-10-29 DIAGNOSIS — E1110 Type 2 diabetes mellitus with ketoacidosis without coma: Secondary | ICD-10-CM | POA: Diagnosis present

## 2011-10-29 DIAGNOSIS — D649 Anemia, unspecified: Secondary | ICD-10-CM | POA: Diagnosis present

## 2011-10-29 DIAGNOSIS — E1310 Other specified diabetes mellitus with ketoacidosis without coma: Secondary | ICD-10-CM | POA: Diagnosis present

## 2011-10-29 HISTORY — DX: Cerebral infarction, unspecified: I63.9

## 2011-10-29 HISTORY — DX: Acute embolism and thrombosis of unspecified deep veins of unspecified lower extremity: I82.409

## 2011-10-29 HISTORY — DX: Other pulmonary embolism without acute cor pulmonale: I26.99

## 2011-10-29 LAB — CBC
HCT: 34.3 % — ABNORMAL LOW (ref 39.0–52.0)
HCT: 33 % — ABNORMAL LOW (ref 39.0–52.0)
Hemoglobin: 11.5 g/dL — ABNORMAL LOW (ref 13.0–17.0)
Hemoglobin: 11.2 g/dL — ABNORMAL LOW (ref 13.0–17.0)
MCH: 30.7 pg (ref 26.0–34.0)
MCHC: 33.5 g/dL (ref 30.0–36.0)
MCV: 91.5 fL (ref 78.0–100.0)
MCV: 89.9 fL (ref 78.0–100.0)
RBC: 3.75 MIL/uL — ABNORMAL LOW (ref 4.22–5.81)
RBC: 3.67 MIL/uL — ABNORMAL LOW (ref 4.22–5.81)
WBC: 14 10*3/uL — ABNORMAL HIGH (ref 4.0–10.5)

## 2011-10-29 LAB — GLUCOSE, CAPILLARY
Glucose-Capillary: 401 mg/dL — ABNORMAL HIGH (ref 70–99)
Glucose-Capillary: 421 mg/dL — ABNORMAL HIGH (ref 70–99)
Glucose-Capillary: 359 mg/dL — ABNORMAL HIGH (ref 70–99)
Glucose-Capillary: 293 mg/dL — ABNORMAL HIGH (ref 70–99)
Glucose-Capillary: 237 mg/dL — ABNORMAL HIGH (ref 70–99)
Glucose-Capillary: 100 mg/dL — ABNORMAL HIGH (ref 70–99)
Glucose-Capillary: 104 mg/dL — ABNORMAL HIGH (ref 70–99)
Glucose-Capillary: 130 mg/dL — ABNORMAL HIGH (ref 70–99)

## 2011-10-29 LAB — BASIC METABOLIC PANEL
CO2: 26 mEq/L (ref 19–32)
Chloride: 101 mEq/L (ref 96–112)
Creatinine, Ser: 2.2 mg/dL — ABNORMAL HIGH (ref 0.50–1.35)
GFR calc Af Amer: 29 mL/min — ABNORMAL LOW (ref >90)
GFR calc Af Amer: 30 mL/min — ABNORMAL LOW (ref >90)
GFR calc non Af Amer: 25 mL/min — ABNORMAL LOW (ref >90)
Potassium: 5.1 mEq/L (ref 3.5–5.1)
Sodium: 133 mEq/L — ABNORMAL LOW (ref 135–145)
Sodium: 135 mEq/L (ref 135–145)

## 2011-10-29 LAB — COMPREHENSIVE METABOLIC PANEL
Alkaline Phosphatase: 96 U/L (ref 39–117)
BUN: 38 mg/dL — ABNORMAL HIGH (ref 6–23)
Calcium: 9.1 mg/dL (ref 8.4–10.5)
Creatinine, Ser: 2.33 mg/dL — ABNORMAL HIGH (ref 0.50–1.35)
GFR calc Af Amer: 28 mL/min — ABNORMAL LOW (ref >90)
Glucose, Bld: 494 mg/dL — ABNORMAL HIGH (ref 70–99)
Potassium: 5.3 mEq/L — ABNORMAL HIGH (ref 3.5–5.1)
Total Protein: 6.2 g/dL (ref 6.0–8.3)

## 2011-10-29 LAB — DIFFERENTIAL
Basophils Relative: 0 % (ref 0–1)
Eosinophils Absolute: 0 10*3/uL (ref 0.0–0.7)
Eosinophils Relative: 0 % (ref 0–5)
Lymphs Abs: 0.6 10*3/uL — ABNORMAL LOW (ref 0.7–4.0)
Monocytes Absolute: 1 10*3/uL (ref 0.1–1.0)
Monocytes Relative: 6 % (ref 3–12)

## 2011-10-29 LAB — URINALYSIS, ROUTINE W REFLEX MICROSCOPIC
Hgb urine dipstick: NEGATIVE
Nitrite: NEGATIVE
Protein, ur: NEGATIVE mg/dL
Urobilinogen, UA: 0.2 mg/dL (ref 0.0–1.0)

## 2011-10-29 LAB — TROPONIN I: Troponin I: <0.3 ng/mL (ref <0.30)

## 2011-10-29 LAB — MRSA PCR SCREENING: MRSA by PCR: NEGATIVE

## 2011-10-29 LAB — HEMOGLOBIN A1C
Hgb A1c MFr Bld: 7.3 % — ABNORMAL HIGH (ref <5.7)
Hgb A1c MFr Bld: 7.5 % — ABNORMAL HIGH (ref <5.7)

## 2011-10-29 LAB — URINE MICROSCOPIC-ADD ON

## 2011-10-29 MED ORDER — OXYCODONE HCL 5 MG PO TABS: 5.0000 mg | ORAL_TABLET | ORAL | Status: DC | PRN

## 2011-10-29 MED ORDER — LORAZEPAM 2 MG/ML IJ SOLN: 1.0000 mg | Freq: Four times a day (QID) | INTRAMUSCULAR | Status: AC | PRN

## 2011-10-29 MED ORDER — LORAZEPAM 0.5 MG PO TABS
0.0000 mg | ORAL_TABLET | Freq: Four times a day (QID) | ORAL | Status: AC
  Administered 2011-10-29 (×2): 0.5 mg via ORAL
  Filled 2011-10-29 (×3): qty 1

## 2011-10-29 MED ORDER — SODIUM CHLORIDE 0.9 % IV SOLN: INTRAVENOUS | Status: DC

## 2011-10-29 MED ORDER — SENNA 8.6 MG PO TABS: 2.0000 | ORAL_TABLET | Freq: Every day | ORAL | Status: DC | PRN

## 2011-10-29 MED ORDER — INSULIN GLARGINE 100 UNIT/ML ~~LOC~~ SOLN
SUBCUTANEOUS | Status: AC
  Filled 2011-10-29: qty 3

## 2011-10-29 MED ORDER — EZETIMIBE 10 MG PO TABS
10.0000 mg | ORAL_TABLET | Freq: Every day | ORAL | Status: DC
  Administered 2011-10-29 – 2011-11-04 (×6): 10 mg via ORAL
  Filled 2011-10-29 (×6): qty 1

## 2011-10-29 MED ORDER — MORPHINE SULFATE 4 MG/ML IJ SOLN
4.0000 mg | Freq: Once | INTRAMUSCULAR | Status: AC
  Administered 2011-10-29: 4 mg via INTRAVENOUS
  Filled 2011-10-29: qty 1

## 2011-10-29 MED ORDER — SODIUM CHLORIDE 0.9 % IV SOLN
INTRAVENOUS | Status: AC
  Administered 2011-10-29: 07:00:00 via INTRAVENOUS

## 2011-10-29 MED ORDER — SODIUM CHLORIDE 0.9 % IV BOLUS (SEPSIS)
1000.0000 mL | Freq: Once | INTRAVENOUS | Status: AC
  Administered 2011-10-29: 1000 mL via INTRAVENOUS

## 2011-10-29 MED ORDER — SODIUM CHLORIDE 0.9 % IJ SOLN
10.0000 mL | Freq: Two times a day (BID) | INTRAMUSCULAR | Status: DC
  Administered 2011-10-29 – 2011-11-04 (×10): 10 mL
  Filled 2011-10-29 (×8): qty 3

## 2011-10-29 MED ORDER — ONDANSETRON HCL 4 MG/2ML IJ SOLN
4.0000 mg | Freq: Once | INTRAMUSCULAR | Status: AC
  Administered 2011-10-29: 4 mg via INTRAVENOUS
  Filled 2011-10-29: qty 2

## 2011-10-29 MED ORDER — SODIUM CHLORIDE 0.9 % IV SOLN
INTRAVENOUS | Status: DC
  Administered 2011-10-29: 75 mL/h via INTRAVENOUS
  Administered 2011-10-29: 23:00:00 via INTRAVENOUS
  Administered 2011-10-30: 75 mL via INTRAVENOUS
  Administered 2011-10-30: 23:00:00 via INTRAVENOUS
  Administered 2011-10-31: 1000 mL via INTRAVENOUS

## 2011-10-29 MED ORDER — THIAMINE HCL 100 MG/ML IJ SOLN
100.0000 mg | Freq: Every day | INTRAMUSCULAR | Status: DC
  Administered 2011-10-29: 11:00:00 via INTRAVENOUS
  Administered 2011-10-30 – 2011-11-01 (×2): 100 mg via INTRAVENOUS
  Filled 2011-10-29 (×3): qty 2

## 2011-10-29 MED ORDER — LATANOPROST 0.005 % OP SOLN
1.0000 [drp] | Freq: Every day | OPHTHALMIC | Status: DC
  Administered 2011-10-29 – 2011-11-04 (×6): 1 [drp] via OPHTHALMIC
  Filled 2011-10-29: qty 2.5

## 2011-10-29 MED ORDER — SODIUM CHLORIDE 0.9 % IV SOLN
INTRAVENOUS | Status: DC
  Administered 2011-10-29: 3.6 [IU]/h via INTRAVENOUS
  Filled 2011-10-29: qty 1

## 2011-10-29 MED ORDER — CITALOPRAM HYDROBROMIDE 20 MG PO TABS
10.0000 mg | ORAL_TABLET | Freq: Every day | ORAL | Status: DC
  Administered 2011-10-29 – 2011-11-02 (×4): 10 mg via ORAL
  Administered 2011-11-03 – 2011-11-04 (×2): via ORAL
  Filled 2011-10-29 (×6): qty 1

## 2011-10-29 MED ORDER — INSULIN REGULAR HUMAN 100 UNIT/ML IJ SOLN
INTRAMUSCULAR | Status: AC
  Filled 2011-10-29: qty 3

## 2011-10-29 MED ORDER — SODIUM CHLORIDE 0.9 % IJ SOLN
10.0000 mL | INTRAMUSCULAR | Status: DC | PRN
  Filled 2011-10-29 (×3): qty 3

## 2011-10-29 MED ORDER — THERA M PLUS PO TABS
1.0000 | ORAL_TABLET | Freq: Every day | ORAL | Status: DC
  Administered 2011-10-29 – 2011-11-04 (×6): 1 via ORAL
  Filled 2011-10-29 (×6): qty 1

## 2011-10-29 MED ORDER — DEXTROSE-NACL 5-0.45 % IV SOLN: INTRAVENOUS | Status: DC

## 2011-10-29 MED ORDER — DEXTROSE 50 % IV SOLN: 25.0000 mL | INTRAVENOUS | Status: DC | PRN

## 2011-10-29 MED ORDER — ONDANSETRON HCL 4 MG/2ML IJ SOLN: 4.0000 mg | Freq: Four times a day (QID) | INTRAMUSCULAR | Status: DC | PRN

## 2011-10-29 MED ORDER — ACETAMINOPHEN 325 MG PO TABS: 650.0000 mg | ORAL_TABLET | Freq: Four times a day (QID) | ORAL | Status: DC | PRN

## 2011-10-29 MED ORDER — INSULIN GLARGINE 100 UNIT/ML ~~LOC~~ SOLN
5.0000 [IU] | Freq: Every day | SUBCUTANEOUS | Status: DC
  Administered 2011-10-29: 5 [IU] via SUBCUTANEOUS

## 2011-10-29 MED ORDER — LORAZEPAM 0.5 MG PO TABS: 1.0000 mg | ORAL_TABLET | Freq: Four times a day (QID) | ORAL | Status: AC | PRN

## 2011-10-29 MED ORDER — ENOXAPARIN SODIUM 40 MG/0.4ML ~~LOC~~ SOLN
40.0000 mg | Freq: Once | SUBCUTANEOUS | Status: AC
  Administered 2011-10-29: 40 mg via SUBCUTANEOUS
  Filled 2011-10-29: qty 0.4

## 2011-10-29 MED ORDER — INSULIN ASPART 100 UNIT/ML ~~LOC~~ SOLN
10.0000 [IU] | Freq: Once | SUBCUTANEOUS | Status: AC
  Administered 2011-10-29: 100 [IU] via INTRAVENOUS

## 2011-10-29 MED ORDER — LORAZEPAM 0.5 MG PO TABS: 0.0000 mg | ORAL_TABLET | Freq: Two times a day (BID) | ORAL | Status: AC

## 2011-10-29 MED ORDER — FOLIC ACID 1 MG PO TABS
1.0000 mg | ORAL_TABLET | Freq: Every day | ORAL | Status: DC
  Administered 2011-10-29 – 2011-11-04 (×6): 1 mg via ORAL
  Filled 2011-10-29 (×6): qty 1

## 2011-10-29 MED ORDER — ALBUTEROL SULFATE (5 MG/ML) 0.5% IN NEBU: 2.5000 mg | INHALATION_SOLUTION | RESPIRATORY_TRACT | Status: DC | PRN

## 2011-10-29 MED ORDER — DOCUSATE SODIUM 100 MG PO CAPS
100.0000 mg | ORAL_CAPSULE | Freq: Two times a day (BID) | ORAL | Status: DC
  Administered 2011-10-29 – 2011-11-04 (×12): 100 mg via ORAL
  Filled 2011-10-29 (×12): qty 1

## 2011-10-29 MED ORDER — INSULIN ASPART 100 UNIT/ML ~~LOC~~ SOLN
3.0000 [IU] | Freq: Three times a day (TID) | SUBCUTANEOUS | Status: DC
  Administered 2011-10-29 – 2011-11-01 (×6): 3 [IU] via SUBCUTANEOUS

## 2011-10-29 MED ORDER — INSULIN REGULAR HUMAN 100 UNIT/ML IJ SOLN: 10.0000 [IU] | Freq: Once | INTRAMUSCULAR | Status: DC

## 2011-10-29 MED ORDER — INSULIN ASPART 100 UNIT/ML ~~LOC~~ SOLN
0.0000 [IU] | Freq: Three times a day (TID) | SUBCUTANEOUS | Status: DC
  Administered 2011-10-29: 1 [IU] via SUBCUTANEOUS
  Administered 2011-10-30: 3 [IU] via SUBCUTANEOUS
  Administered 2011-10-30: 7 [IU] via SUBCUTANEOUS
  Administered 2011-10-30: 3 [IU] via SUBCUTANEOUS
  Administered 2011-10-31: 5 [IU] via SUBCUTANEOUS
  Administered 2011-10-31: 7 [IU] via SUBCUTANEOUS
  Administered 2011-11-01: 5 [IU] via SUBCUTANEOUS
  Administered 2011-11-01: 3 [IU] via SUBCUTANEOUS
  Administered 2011-11-01: 5 [IU] via SUBCUTANEOUS
  Administered 2011-11-02: 1 [IU] via SUBCUTANEOUS
  Administered 2011-11-02 (×2): 2 [IU] via SUBCUTANEOUS
  Administered 2011-11-03: 3 [IU] via SUBCUTANEOUS
  Administered 2011-11-03: 9 [IU] via SUBCUTANEOUS
  Administered 2011-11-03: 1 [IU] via SUBCUTANEOUS
  Administered 2011-11-04: 2 [IU] via SUBCUTANEOUS
  Filled 2011-10-29: qty 3

## 2011-10-29 MED ORDER — VITAMIN B-12 1000 MCG PO TABS
1000.0000 ug | ORAL_TABLET | Freq: Every day | ORAL | Status: DC
  Administered 2011-10-29 – 2011-11-04 (×6): 1000 ug via ORAL
  Filled 2011-10-29 (×9): qty 1

## 2011-10-29 MED ORDER — INSULIN ASPART 100 UNIT/ML ~~LOC~~ SOLN
0.0000 [IU] | Freq: Every day | SUBCUTANEOUS | Status: DC
  Administered 2011-10-30: 3 [IU] via SUBCUTANEOUS
  Administered 2011-10-31 – 2011-11-02 (×3): 2 [IU] via SUBCUTANEOUS

## 2011-10-29 MED ORDER — DEXTROSE 5 % IV SOLN
INTRAVENOUS | Status: AC
  Administered 2011-10-29: 14:00:00 via INTRAVENOUS

## 2011-10-29 MED ORDER — MORPHINE SULFATE 4 MG/ML IJ SOLN
2.0000 mg | INTRAMUSCULAR | Status: DC | PRN
  Administered 2011-10-29 – 2011-11-01 (×9): 2 mg via INTRAVENOUS
  Filled 2011-10-29 (×9): qty 1

## 2011-10-29 MED ORDER — SODIUM CHLORIDE 0.9 % IJ SOLN
INTRAMUSCULAR | Status: AC
  Filled 2011-10-29: qty 3

## 2011-10-29 MED ORDER — ONDANSETRON HCL 4 MG PO TABS: 4.0000 mg | ORAL_TABLET | Freq: Four times a day (QID) | ORAL | Status: DC | PRN

## 2011-10-29 MED ORDER — ACETAMINOPHEN 650 MG RE SUPP: 650.0000 mg | Freq: Four times a day (QID) | RECTAL | Status: DC | PRN

## 2011-10-29 NOTE — ED Notes (Signed)
Pt states he fell earlier tonight and states he had friends there to help him and didn't feel he needed to come until this am when he called ems for high blood sugar.

## 2011-10-29 NOTE — ED Notes (Signed)
Pt awake in bed, returned from xray.

## 2011-10-29 NOTE — ED Provider Notes (Signed)
History     CSN: [id number] Arrival date & time: 10/29/2011  3:14 AM   First MD Initiated Contact with Patient 10/29/11 0405      Chief Complaint  Patient presents with  . Hip Pain    (Consider location/radiation/quality/duration/timing/severity/associated sxs/prior treatment) Patient is a 77 y.o. male presenting with hip pain. The history is provided by the patient.  Hip Pain This is a new problem. The current episode started 1 to 2 hours ago. The problem occurs constantly. The problem has not changed since onset.Pertinent negatives include no chest pain, no abdominal pain, no headaches and no shortness of breath. Exacerbated by: Movement or palpation of his right hip. The symptoms are relieved by nothing. He has tried nothing for the symptoms. The treatment provided no relief.   At home tonight and states he fell in his kitchen injuring his right hip. He denies striking his head or any other body part. He denies any neck pain. He did sustain a small skin tear to his left elbow. His abrasion over his right eyebrow he states happened the day before. Patient also concerned because his blood sugars elevated tonight. No chest pain or trouble breathing. No recent fevers or chills. No nausea vomiting or diarrhea. Pain is sharp in quality. No radiation for right hip. Pain improved with lying still. Has a history of right ankle fracture in the past repair by Dr. Romeo Apple   Past Medical History  Diagnosis Date  . Gastroparesis   . Diabetes mellitus   . Weight loss     Past Surgical History  Procedure Date  . Carpal tunnel release 08/2011    Left hand  . Ankle surgery     Patient states that he had pins place in the left foot  . Back surgery 2008  . Colonoscopy 01/2011  . Upper gastrointestinal endoscopy 01/2011    Family History  Problem Relation Age of Onset  . Diabetes Mother   . Diabetes Father   . Diabetes Sister   . Diabetes Brother   . Diabetes Sister   . Diabetes Son   .  Healthy Son   . Healthy Son     History  Substance Use Topics  . Smoking status: Former Smoker    Types: Cigarettes    Quit date: 10/27/1981  . Smokeless tobacco: Never Used   Comment: Patient smokes a pack per day  . Alcohol Use: Yes     Drinks Scotch 3 times per week prior to [employer]      Review of Systems  Constitutional: Negative for fever and chills.  HENT: Negative for neck pain and neck stiffness.   Eyes: Negative for pain.  Respiratory: Negative for shortness of breath.   Cardiovascular: Negative for chest pain.  Gastrointestinal: Negative for abdominal pain.  Genitourinary: Negative for dysuria.  Musculoskeletal: Negative for back pain.  Skin: Positive for wound. Negative for rash.  Neurological: Negative for weakness, numbness and headaches.  All other systems reviewed and are negative.    Allergies  Review of patient's allergies indicates no known allergies.  Home Medications   Current Outpatient Rx  Name Route Sig Dispense Refill  . ASPIRIN 81 MG PO TABS Oral Take 81 mg by mouth daily.      Marland Kitchen CITALOPRAM HYDROBROMIDE 10 MG PO TABS Oral Take 10 mg by mouth daily.      Marland Kitchen EZETIMIBE 10 MG PO TABS Oral Take 10 mg by mouth daily.      . INSULIN ASPART 100  UNIT/ML [place] SOLN Subcutaneous Inject 6 Units into the skin 3 (three) times daily before meals.      . INSULIN GLARGINE 100 UNIT/ML [place] SOLN Subcutaneous Inject 10 Units into the skin at bedtime.      Marland Kitchen LATANOPROST 0.005 % OP SOLN Both Eyes Place 1 drop into both eyes daily.      Marland Kitchen VITAMIN B-12 1000 MCG PO TABS Oral Take 1,000 mcg by mouth daily.      Marland Kitchen VITAMIN D (ERGOCALCIFEROL) 50000 UNITS PO CAPS Oral Take 50,000 Units by mouth every 7 (seven) days.        BP 108/39  Pulse 92  Temp(Src) 98.4 F (36.9 C) (Oral)  Resp 18  SpO2 100%  Physical Exam  Constitutional: He is oriented to person, place, and time. He appears well-developed and well-nourished.  HENT:  Head: Normocephalic.       Small abrasion over  and lateral to the right eyebrow no underlying bony deformity. No deep laceration. Extraocular movements intact with no entrapment. No epistaxis or septal hematoma. No midface instability.  Eyes: Conjunctivae and EOM are normal. Pupils are equal, round, and reactive to light.  Neck: Trachea normal. Neck supple. No thyromegaly present.  Cardiovascular: Normal rate, regular rhythm, S1 normal, S2 normal and normal pulses.     No systolic murmur is present   No diastolic murmur is present  Pulses:      Radial pulses are 2+ on the right side, and 2+ on the left side.  Pulmonary/Chest: Effort normal and breath sounds normal. He has no wheezes. He has no rhonchi. He has no rales. He exhibits no tenderness.  Abdominal: Soft. Normal appearance and bowel sounds are normal. There is no tenderness. There is no CVA tenderness and negative Murphy's sign.  Musculoskeletal:       Right lower extremity: Rotational deformity of the extremity with tenderness to palpation over the greater trochanter. Pelvis stable. No tenderness to palpation over the knee and ankle. Distal neurovascular intact . Old ecchymosis over right great toe.  Left upper extremity:  mild skin tear over elbow with distal neurovascular intact and full range of motion at elbow and wrist without any bony deformity or tenderness.  Neurological: He is alert and oriented to person, place, and time. He has normal strength. No cranial nerve deficit or sensory deficit. GCS eye subscore is 4. GCS verbal subscore is 5. GCS motor subscore is 6.  Skin: Skin is warm and dry. No rash noted. He is not diaphoretic.  Psychiatric: His speech is normal.       Cooperative and appropriate    ED Course  Procedures (including critical care time)  Results for orders placed during the hospital encounter of 10/29/11  CBC      Component Value Range   WBC 17.1 (*) 4.0 - 10.5 (K/uL)   RBC 3.75 (*) 4.22 - 5.81 (MIL/uL)   Hemoglobin 11.5 (*) 13.0 - 17.0 (g/dL)   HCT  16.1 (*) 09.6 - 52.0 (%)   MCV 91.5  78.0 - 100.0 (fL)   MCH 30.7  26.0 - 34.0 (pg)   MCHC 33.5  30.0 - 36.0 (g/dL)   RDW 04.5  40.9 - 81.1 (%)   Platelets 286  150 - 400 (K/uL)  DIFFERENTIAL      Component Value Range   Neutrophils Relative 91 (*) 43 - 77 (%)   Neutro Abs 15.5 (*) 1.7 - 7.7 (K/uL)   Lymphocytes Relative 4 (*) 12 - 46 (%)  Lymphs Abs 0.6 (*) 0.7 - 4.0 (K/uL)   Monocytes Relative 6  3 - 12 (%)   Monocytes Absolute 1.0  0.1 - 1.0 (K/uL)   Eosinophils Relative 0  0 - 5 (%)   Eosinophils Absolute 0.0  0.0 - 0.7 (K/uL)   Basophils Relative 0  0 - 1 (%)   Basophils Absolute 0.0  0.0 - 0.1 (K/uL)  COMPREHENSIVE METABOLIC PANEL      Component Value Range   Sodium 131 (*) 135 - 145 (mEq/L)   Potassium 5.3 (*) 3.5 - 5.1 (mEq/L)   Chloride 93 (*) 96 - 112 (mEq/L)   CO2 18 (*) 19 - 32 (mEq/L)   Glucose, Bld 494 (*) 70 - 99 (mg/dL)   BUN 38 (*) 6 - 23 (mg/dL)   Creatinine, Ser 1.19 (*) 0.50 - 1.35 (mg/dL)   Calcium 9.1  8.4 - 14.7 (mg/dL)   Total Protein 6.2  6.0 - 8.3 (g/dL)   Albumin 3.5  3.5 - 5.2 (g/dL)   AST 16  0 - 37 (U/L)   ALT 14  0 - 53 (U/L)   Alkaline Phosphatase 96  39 - 117 (U/L)   Total Bilirubin 0.2 (*) 0.3 - 1.2 (mg/dL)   GFR calc non Af Amer 24 (*) >90 (mL/min)   GFR calc Af Amer 28 (*) >90 (mL/min)  GLUCOSE, CAPILLARY      Component Value Range   Glucose-Capillary 401 (*) 70 - 99 (mg/dL)  TROPONIN I      Component Value Range   Troponin I <0.30  <0.30 (ng/mL)   Dg Chest 1 View  10/29/2011  *RADIOLOGY REPORT*  Clinical Data: Status post fall; preoperative chest radiograph for right hip fracture.  CHEST - 1 VIEW  Comparison: Chest radiograph performed 12/18/2010 and CT of the chest performed 12/19/2010  Findings: The lungs are well-aerated and clear.  There is no evidence of focal opacification, pleural effusion or pneumothorax.  The cardiomediastinal silhouette is within normal limits.  No acute osseous abnormalities are seen.  Healed left-sided rib  fractures are again noted.  IMPRESSION: No acute cardiopulmonary process seen.  Original Report Authenticated By: Tonia Ghent, M.D.   Dg Hip Complete Right  10/29/2011  *RADIOLOGY REPORT*  Clinical Data: Status post fall; right hip pain.  RIGHT HIP - COMPLETE 2+ VIEW  Comparison: None.  Findings: There is a comminuted intertrochanteric fracture through the proximal right femur, with medially displaced lesser trochanteric fragments.  The right femoral head remains seated at the acetabulum.  There is mild shortening at the fracture site.  The left hip is grossly unremarkable in appearance. The sacroiliac joints are within normal limits.  Mild degenerative change is noted along the lower lumbar spine.  The visualized bowel gas pattern is grossly unremarkable in appearance.  IMPRESSION: Comminuted intertrochanteric fracture through the proximal right femur, with medially displaced lesser trochanteric fragments and mild shortening at the fracture site.  Original Report Authenticated By: Tonia Ghent, M.D.       Pulse ox adequate room air is 100 percent.   Date: 10/29/2011  Rate: 91  Rhythm: normal sinus rhythm  QRS Axis: normal  Intervals: PR prolonged  ST/T Wave abnormalities: nonspecific ST changes  Conduction Disutrbances:first-degree A-V block   Narrative Interpretation:   Old EKG Reviewed: unchanged  Pain medications provided. IV fluids. Foley placed and no urine return. Electrolyte abnormalities noted as above, anion gap 20.  Case discussed as above with Dr. Rito Ehrlich at 5 AM who agrees to admission.  Plan orthopedic consultation for definitive fracture care. DR Rito Ehrlich aware that I was unable to get in touch with Dr. Romeo Apple. I did speak with Dr. Hilda Lias on call for orthopedics, and Dr. Romeo Apple is in town. Will continue to attempt to get in touch with him.  MDM   Fall with right hip injury and fracture on x-ray as above. Patient prefers to have Dr. Romeo Apple repair this fracture and  consult was requested. Medicine also consulted for admission given elevated creatinine and elevated blood sugar. Leukocytosis noted. No meningismus. No abdominal tenderness. No evidence of infection by exam, is afebrile without any dysuria, cough or history of recent illness. Plan medicine admit.        Sunnie Nielsen, MD 10/29/11 [phone]

## 2011-10-29 NOTE — ED Notes (Signed)
Pts bs [phone], dr.krishnan notified.  Orders changed

## 2011-10-29 NOTE — ED Notes (Signed)
Family at bedside. Patient asked for some pain medicine. RN Elmer Bales aware. No urine returned after inserting the foley.

## 2011-10-29 NOTE — ED Notes (Signed)
Attempted to call report, will return call.

## 2011-10-29 NOTE — Progress Notes (Addendum)
Subjective: Patient still complaining of hip pain, right. Legally blind. And hard of hearing. No nausea or vomiting.   Objective: Filed Vitals:   10/29/11 0502 10/29/11 0609 10/29/11 0614 10/29/11 0616  BP: 108/39 90/34 106/37 106/37  Pulse: 92 86 88   Temp: 98.4 F (36.9 C)     TempSrc: Oral     Resp: 18 18 20    Height:    5\' 9"  (1.753 m)  Weight:    62.5 kg (137 lb 12.6 oz)  SpO2:  98% 97%    Weight change:  No intake or output data in the 24 hours ending 10/29/11 0755  General: Alert, awake, oriented x3, in no acute distress.  HEENT: No bruits, no goiter.  Heart: Regular rate and rhythm, without murmurs, rubs, gallops.  Lungs: Barrel chest poor air movement no wheezing no rhonchi no crackles. Abdomen: Soft, nontender, nondistended, positive bowel sounds.  Neuro: Grossly intact, nonfocal. Extremities: Right lower extremity shorter than the left and externally rotated.  Lab Results:  [facility] 10/29/11 0351  NA 131*  K 5.3*  CL 93*  CO2 18*  GLUCOSE 494*  BUN 38*  CREATININE 2.33*  CALCIUM 9.1  MG --  PHOS --    Basename 10/29/11 0351  AST 16  ALT 14  ALKPHOS 96  BILITOT 0.2*  PROT 6.2  ALBUMIN 3.5   No results found for this basename: LIPASE:2,AMYLASE:2 in the last 72 hours  Basename 10/29/11 0351  WBC 17.1*  NEUTROABS 15.5*  HGB 11.5*  HCT 34.3*  MCV 91.5  PLT 286    Basename 10/29/11 0524  CKTOTAL --  CKMB --  CKMBINDEX --  TROPONINI <0.30   No results found for this basename: POCBNP:3 in the last 72 hours No results found for this basename: DDIMER:2 in the last 72 hours No results found for this basename: HGBA1C:2 in the last 72 hours No results found for this basename: CHOL:2,HDL:2,LDLCALC:2,TRIG:2,CHOLHDL:2,LDLDIRECT:2 in the last 72 hours No results found for this basename: TSH,T4TOTAL,FREET3,T3FREE,THYROIDAB in the last 72 hours No results found for this basename: VITAMINB12:2,FOLATE:2,FERRITIN:2,TIBC:2,IRON:2,RETICCTPCT:2 in the last 72  hours  Micro Results: No results found for this or any previous visit (from the past 240 hour(s)).  Studies/Results: Dg Chest 1 View  10/29/2011  *RADIOLOGY REPORT*  Clinical Data: Status post fall; preoperative chest radiograph for right hip fracture.  CHEST - 1 VIEW  Comparison: Chest radiograph performed 12/18/2010 and CT of the chest performed 12/19/2010  Findings: The lungs are well-aerated and clear.  There is no evidence of focal opacification, pleural effusion or pneumothorax.  The cardiomediastinal silhouette is within normal limits.  No acute osseous abnormalities are seen.  Healed left-sided rib fractures are again noted.  IMPRESSION: No acute cardiopulmonary process seen.  Original Report Authenticated By: Tonia Ghent, M.D.   Dg Hip Complete Right  10/29/2011  *RADIOLOGY REPORT*  Clinical Data: Status post fall; right hip pain.  RIGHT HIP - COMPLETE 2+ VIEW  Comparison: None.  Findings: There is a comminuted intertrochanteric fracture through the proximal right femur, with medially displaced lesser trochanteric fragments.  The right femoral head remains seated at the acetabulum.  There is mild shortening at the fracture site.  The left hip is grossly unremarkable in appearance. The sacroiliac joints are within normal limits.  Mild degenerative change is noted along the lower lumbar spine.  The visualized bowel gas pattern is grossly unremarkable in appearance.  IMPRESSION: Comminuted intertrochanteric fracture through the proximal right femur, with medially displaced lesser trochanteric fragments  and mild shortening at the fracture site.  Original Report Authenticated By: Tonia Ghent, M.D.   Ct Head Wo Contrast  10/29/2011  *RADIOLOGY REPORT*  Clinical Data:  Status post fall, with laceration above the right eye.  Concern for cervical spine injury.  CT HEAD WITHOUT CONTRAST AND CT CERVICAL SPINE WITHOUT CONTRAST  Technique:  Multidetector CT imaging of the head and cervical spine was  performed following the standard protocol without intravenous contrast.  Multiplanar CT image reconstructions of the cervical spine were also generated.  Comparison: CT of the head performed 11/16/2006, and MRI of the brain performed 07/04/2010  CT HEAD  Findings: There is no evidence of acute infarction, mass lesion, or intra- or extra-axial hemorrhage on CT.  Prominence of the ventricles and sulci reflects moderate cortical volume loss.  Cerebellar atrophy is noted.  Mild periventricular and subcortical white matter change likely reflects small vessel ischemic microangiopathy.  Small chronic lacunar infarcts are seen within the basal ganglia and thalami bilaterally, and likely within the pons.  The cerebral hemispheres demonstrate grossly normal gray-white differentiation.  No mass effect or midline shift is seen.  There is no evidence of fracture; visualized osseous structures are unremarkable in appearance.  The orbits are within normal limits. Mucosal thickening is noted within the frontal sinuses; the remaining paranasal sinuses and mastoid air cells are well-aerated. The patient's known right supraorbital soft tissue laceration is not well characterized on CT.  IMPRESSION:  1.  No evidence of traumatic intracranial injury or fracture. 2.  Moderate cortical volume loss and scattered small vessel ischemic microangiopathy. 3.  Small chronic lacunar infarcts within the basal ganglia and thalami, and likely within the pons. 4.  Mucosal thickening within the frontal sinuses.  CT CERVICAL SPINE  Findings: There is no evidence of acute fracture or subluxation. There is chronic grade 1 retrolisthesis of C3 on C4, and grade 1 anterolisthesis of C7 on T1; these appear to reflect facet disease. There is mild chronic anterior wedging involving vertebral body T1. There is multilevel disc space narrowing along the cervical and upper thoracic spine.  Associated small anterior and posterior disc osteophyte complexes are  noted.  Prevertebral soft tissues are within normal limits.  The thyroid gland is unremarkable in appearance.  The visualized lung apices are clear.  Mild calcification is noted at the carotid bifurcations, more prominent on the right.  No significant soft tissue abnormalities are seen.  IMPRESSION:  1.  No evidence of acute fracture or subluxation along the cervical spine. 2.  Chronic grade 1 retrolisthesis of C3 on C4, and grade 1 anterolisthesis of C7-9 T1, reflecting facet disease.  Mild chronic anterior wedging involving vertebral body T1. 3.  Mild calcification at the carotid bifurcations, more prominent on the right.  Original Report Authenticated By: Tonia Ghent, M.D.   Ct Cervical Spine Wo Contrast  10/29/2011  *RADIOLOGY REPORT*  Clinical Data:  Status post fall, with laceration above the right eye.  Concern for cervical spine injury.  CT HEAD WITHOUT CONTRAST AND CT CERVICAL SPINE WITHOUT CONTRAST  Technique:  Multidetector CT imaging of the head and cervical spine was performed following the standard protocol without intravenous contrast.  Multiplanar CT image reconstructions of the cervical spine were also generated.  Comparison: CT of the head performed 11/16/2006, and MRI of the brain performed 07/04/2010  CT HEAD  Findings: There is no evidence of acute infarction, mass lesion, or intra- or extra-axial hemorrhage on CT.  Prominence of the ventricles and sulci  reflects moderate cortical volume loss.  Cerebellar atrophy is noted.  Mild periventricular and subcortical white matter change likely reflects small vessel ischemic microangiopathy.  Small chronic lacunar infarcts are seen within the basal ganglia and thalami bilaterally, and likely within the pons.  The cerebral hemispheres demonstrate grossly normal gray-white differentiation.  No mass effect or midline shift is seen.  There is no evidence of fracture; visualized osseous structures are unremarkable in appearance.  The orbits are within  normal limits. Mucosal thickening is noted within the frontal sinuses; the remaining paranasal sinuses and mastoid air cells are well-aerated. The patient's known right supraorbital soft tissue laceration is not well characterized on CT.  IMPRESSION:  1.  No evidence of traumatic intracranial injury or fracture. 2.  Moderate cortical volume loss and scattered small vessel ischemic microangiopathy. 3.  Small chronic lacunar infarcts within the basal ganglia and thalami, and likely within the pons. 4.  Mucosal thickening within the frontal sinuses.  CT CERVICAL SPINE  Findings: There is no evidence of acute fracture or subluxation. There is chronic grade 1 retrolisthesis of C3 on C4, and grade 1 anterolisthesis of C7 on T1; these appear to reflect facet disease. There is mild chronic anterior wedging involving vertebral body T1. There is multilevel disc space narrowing along the cervical and upper thoracic spine.  Associated small anterior and posterior disc osteophyte complexes are noted.  Prevertebral soft tissues are within normal limits.  The thyroid gland is unremarkable in appearance.  The visualized lung apices are clear.  Mild calcification is noted at the carotid bifurcations, more prominent on the right.  No significant soft tissue abnormalities are seen.  IMPRESSION:  1.  No evidence of acute fracture or subluxation along the cervical spine. 2.  Chronic grade 1 retrolisthesis of C3 on C4, and grade 1 anterolisthesis of C7-9 T1, reflecting facet disease.  Mild chronic anterior wedging involving vertebral body T1. 3.  Mild calcification at the carotid bifurcations, more prominent on the right.  Original Report Authenticated By: Tonia Ghent, M.D.    Medications: I have reviewed the patient's current medications.   Principal Problem:  *DKA (diabetic ketoacidoses) Active Problems:  Hip fracture, right  ARF (acute renal failure)  Hyperkalemia  Diabetes type 2, uncontrolled  Leukocytosis     Assessment and plan:  -DKA, continue insulin drip CBGs every 2 hours, be met every 4 hours (Glucomander). Once her bicarbonate above 20, and anion gap closed probably switch him to subcutaneous insulin. We'll start his diet patient able to eat, he is not nauseated. His hyperkalemia is probably due to the acidosis. Went to follow B. meds closely. My guess is once this acidosis is resolved his hyperkalemia will improve. Also a component of acute renal failure might be contributing to the hyperkalemia. So we'll continue to monitor closely.  -Call his orthopedic Dr. At this time he is not clear for surgery due to his DKA and acute renal failure.  -Will continue IV fluids aggressively his baseline creatinine is around 1.0. Is not documented heart failure. Also get a urinary sodium urinary creatinine.  -Diabetes type 2, please see DKA.  -This probably stress emargination. We'll continue to hold on antibiotics. Monitor his fevers. Should we put him on prophylactic Lovenox, Ortho?   LOS: 0 days   Marinda Elk M.D. Pager: [phone] [facility] 10/29/2011, 7:55 AM

## 2011-10-29 NOTE — ED Notes (Signed)
Pt brought to er by ems, stated he fell around 9pm, also thinks bs is low, right hip is shortened and rotated outward.  Pt covered in stool at arrival, cleaned and all clothing removed--bagged and given to family.  Pt alert and able to answer all ?'s.

## 2011-10-29 NOTE — ED Notes (Signed)
Pt had large liquid bm in bedpan.

## 2011-10-29 NOTE — H&P (Addendum)
Eduardo Lee is an 77 y.o. male.    PCP: [name], MD, MD   Chief Complaint: Fall, and pain in the right hip  HPI: This is 77 year old, Caucasian male, who is still fairly active who was in his kitchen earlier tonight, when he fell while putting away his dishes. It was a mechanical fall. He denies any syncopal episode. He does tell me that he's been having a lot of vision problems, and feels, that could account for his history of falls. He currently has pain in the right hip. Denies pain elsewhere. Denies any chest pain or shortness of breath with his usual activities at home. Denies any history of heart disease.  Prior to Admission medications   Medication Sig Start Date End Date Taking? Authorizing Provider  aspirin 81 MG tablet Take 81 mg by mouth daily.     Yes Historical Provider, MD  citalopram (CELEXA) 10 MG tablet Take 10 mg by mouth daily.     Yes Historical Provider, MD  ezetimibe (ZETIA) 10 MG tablet Take 10 mg by mouth daily.     Yes Historical Provider, MD  insulin aspart (NOVOLOG) 100 UNIT/ML injection Inject 6 Units into the skin 3 (three) times daily before meals.     Yes Historical Provider, MD  insulin glargine (LANTUS) 100 UNIT/ML injection Inject 10 Units into the skin at bedtime.     Yes Historical Provider, MD  latanoprost (XALATAN) 0.005 % ophthalmic solution Place 1 drop into both eyes daily.     Yes Historical Provider, MD  vitamin B-12 (CYANOCOBALAMIN) 1000 MCG tablet Take 1,000 mcg by mouth daily.     Yes Historical Provider, MD  Vitamin D, Ergocalciferol, (DRISDOL) 50000 UNITS CAPS Take 50,000 Units by mouth every 7 (seven) days.     Yes Historical Provider, MD    Allergies: No Known Allergies  Past Medical History  Diagnosis Date  . Gastroparesis   . Diabetes mellitus   . Weight loss   . Pulmonary embolism 12/2010  . DVT (deep venous thrombosis)   . Stroke     Past Surgical History  Procedure Date  . Carpal tunnel release 08/2011    Left hand  .  Ankle surgery     Patient states that he had pins place in the left foot  . Back surgery 2008  . Colonoscopy 01/2011  . Upper gastrointestinal endoscopy 01/2011    Social History:  reports that he quit smoking about 30 years ago. His smoking use included Cigarettes. He has never used smokeless tobacco. He reports that he drinks alcohol. He reports that he does not use illicit drugs.  Family History:  Family History  Problem Relation Age of Onset  . Diabetes Mother   . Diabetes Father   . Diabetes Sister   . Diabetes Brother   . Diabetes Sister   . Diabetes Son   . Healthy Son   . Healthy Son     Review of Systems - History obtained from the patient General ROS: negative Psychological ROS: negative Ophthalmic ROS: positive for - decreased vision ENT ROS: negative Allergy and Immunology ROS: negative Hematological and Lymphatic ROS: negative Endocrine ROS: negative Respiratory ROS: no cough, shortness of breath, or wheezing Cardiovascular ROS: no chest pain or dyspnea on exertion Gastrointestinal ROS: no abdominal pain, change in bowel habits, or black or bloody stools Genito-Urinary ROS: no dysuria, trouble voiding, or hematuria Musculoskeletal ROS: positive for - joint pain Neurological ROS: negative Dermatological ROS: negative  Physical Examination  Blood pressure 108/39, pulse 92, temperature 98.4 F (36.9 C), temperature source Oral, resp. rate 18, SpO2 100.00%.  General appearance: alert, cooperative and no distress Head: Normocephalic, without obvious abnormality, atraumatic Eyes: conjunctivae/corneas clear. PERRL, EOM's intact. Fundi benign. Throat: lips, mucosa, and tongue normal; teeth and gums normal Neck: no adenopathy, no carotid bruit, no JVD, supple, symmetrical, trachea midline and thyroid not enlarged, symmetric, no tenderness/mass/nodules Resp: clear to auscultation bilaterally Cardio: regular rate and rhythm, S1, S2 normal, no murmur, click, rub or  gallop GI: soft, non-tender; bowel sounds normal; no masses,  no organomegaly Extremities: Right LE is rotated externally. Pulses: 2+ and symmetric Skin: Skin color, texture, turgor normal. No rashes or lesions Lymph nodes: Cervical, supraclavicular, and axillary nodes normal. Neurologic: Grossly normal  Results for orders placed during the hospital encounter of 10/29/11 (from the past 48 hour(s))  GLUCOSE, CAPILLARY     Status: Abnormal   Collection Time   10/29/11  3:16 AM      Component Value Range Comment   Glucose-Capillary 401 (*) 70 - 99 (mg/dL)   CBC     Status: Abnormal   Collection Time   10/29/11  3:51 AM      Component Value Range Comment   WBC 17.1 (*) 4.0 - 10.5 (K/uL)    RBC 3.75 (*) 4.22 - 5.81 (MIL/uL)    Hemoglobin 11.5 (*) 13.0 - 17.0 (g/dL)    HCT 16.1 (*) 09.6 - 52.0 (%)    MCV 91.5  78.0 - 100.0 (fL)    MCH 30.7  26.0 - 34.0 (pg)    MCHC 33.5  30.0 - 36.0 (g/dL)    RDW 04.5  40.9 - 81.1 (%)    Platelets 286  150 - 400 (K/uL)   DIFFERENTIAL     Status: Abnormal   Collection Time   10/29/11  3:51 AM      Component Value Range Comment   Neutrophils Relative 91 (*) 43 - 77 (%)    Neutro Abs 15.5 (*) 1.7 - 7.7 (K/uL)    Lymphocytes Relative 4 (*) 12 - 46 (%)    Lymphs Abs 0.6 (*) 0.7 - 4.0 (K/uL)    Monocytes Relative 6  3 - 12 (%)    Monocytes Absolute 1.0  0.1 - 1.0 (K/uL)    Eosinophils Relative 0  0 - 5 (%)    Eosinophils Absolute 0.0  0.0 - 0.7 (K/uL)    Basophils Relative 0  0 - 1 (%)    Basophils Absolute 0.0  0.0 - 0.1 (K/uL)   COMPREHENSIVE METABOLIC PANEL     Status: Abnormal   Collection Time   10/29/11  3:51 AM      Component Value Range Comment   Sodium 131 (*) 135 - 145 (mEq/L)    Potassium 5.3 (*) 3.5 - 5.1 (mEq/L)    Chloride 93 (*) 96 - 112 (mEq/L)    CO2 18 (*) 19 - 32 (mEq/L)    Glucose, Bld 494 (*) 70 - 99 (mg/dL)    BUN 38 (*) 6 - 23 (mg/dL)    Creatinine, Ser 9.14 (*) 0.50 - 1.35 (mg/dL)    Calcium 9.1  8.4 - 10.5 (mg/dL)     Total Protein 6.2  6.0 - 8.3 (g/dL)    Albumin 3.5  3.5 - 5.2 (g/dL)    AST 16  0 - 37 (U/L)    ALT 14  0 - 53 (U/L)    Alkaline Phosphatase 96  39 -  117 (U/L)    Total Bilirubin 0.2 (*) 0.3 - 1.2 (mg/dL)    GFR calc non Af Amer 24 (*) >90 (mL/min)    GFR calc Af Amer 28 (*) >90 (mL/min)    Dg Chest 1 View  10/29/2011  *RADIOLOGY REPORT*  Clinical Data: Status post fall; preoperative chest radiograph for right hip fracture.  CHEST - 1 VIEW  Comparison: Chest radiograph performed 12/18/2010 and CT of the chest performed 12/19/2010  Findings: The lungs are well-aerated and clear.  There is no evidence of focal opacification, pleural effusion or pneumothorax.  The cardiomediastinal silhouette is within normal limits.  No acute osseous abnormalities are seen.  Healed left-sided rib fractures are again noted.  IMPRESSION: No acute cardiopulmonary process seen.  Original Report Authenticated By: Tonia Ghent, M.D.   Dg Hip Complete Right  10/29/2011  *RADIOLOGY REPORT*  Clinical Data: Status post fall; right hip pain.  RIGHT HIP - COMPLETE 2+ VIEW  Comparison: None.  Findings: There is a comminuted intertrochanteric fracture through the proximal right femur, with medially displaced lesser trochanteric fragments.  The right femoral head remains seated at the acetabulum.  There is mild shortening at the fracture site.  The left hip is grossly unremarkable in appearance. The sacroiliac joints are within normal limits.  Mild degenerative change is noted along the lower lumbar spine.  The visualized bowel gas pattern is grossly unremarkable in appearance.  IMPRESSION: Comminuted intertrochanteric fracture through the proximal right femur, with medially displaced lesser trochanteric fragments and mild shortening at the fracture site.  Original Report Authenticated By: Tonia Ghent, M.D.   EKG shows a sinus rhythm with the normal axis. Heart rate is 91. Intervals appear to be in the normal range except for  first degree AV block. Some prominent T waves are noted.  Assessment/Plan  Principal Problem:  *ARF (acute renal failure) Active Problems:  Hip fracture, right  Hyperkalemia  Diabetes type 2, uncontrolled  Leukocytosis   #1 acute renal failure: This appears to be prerenal. He'll be given IV fluids. Urine analysis will be checked. Renal function will be rechecked in the morning.  #2 hyperkalemia: He'll be given insulin for his hyperglycemia and that should, lower his potassium level. Potassium should also come down with IV hydration. We will recheck his potassium level this morning and if it hasn't come down he will be given kayexalate.   #3 diabetes, type II, uncontrolled with mild DKA.: AG is elevated at 20. HbA1c will be checked. He will require intravenous insulin for now. But I suspect we will be able to transition him to subcutaneous insulin pretty quickly.  #4 leukocytosis: Patient is afebrile. Chest x-ray does not show any pneumonia. His UA is pending at this time. We'll hold off on antibiotics for now unless the UA comes back abnormal.  #5 anemia: We'll check an anemia panel.  #6 Right Hip Fracture: Dr. Hilda Lias is on call. However, the patient prefers to be evaluated by Dr. Romeo Apple. So, we will await evaluation by him. Patient will need to be medically cleared before he can proceed to the OR. However, he doesn't require any cardiac testing.  Patient is a full code.  Patient has history of DVT and PE earlier this year. He is no longer on warfarin. He will be considered high risk for thromboembolic events and so, he should be put on DVT prophylaxis as soon as possible.  Patient has 2 drinks of scotch 2-3 times a week. Says he doesnt have more than  that. Will watch for withdrawal. Give thiamine.   Tallan Sandoz 10/29/2011, 5:41 AM

## 2011-10-30 DIAGNOSIS — S72009A Fracture of unspecified part of neck of unspecified femur, initial encounter for closed fracture: Secondary | ICD-10-CM

## 2011-10-30 LAB — GLUCOSE, CAPILLARY
Glucose-Capillary: 320 mg/dL — ABNORMAL HIGH (ref 70–99)
Glucose-Capillary: 227 mg/dL — ABNORMAL HIGH (ref 70–99)
Glucose-Capillary: 230 mg/dL — ABNORMAL HIGH (ref 70–99)

## 2011-10-30 LAB — CBC
MCH: 30.8 pg (ref 26.0–34.0)
MCHC: 33.9 g/dL (ref 30.0–36.0)
MCV: 91 fL (ref 78.0–100.0)
Platelets: 243 10*3/uL (ref 150–400)
RBC: 3.34 MIL/uL — ABNORMAL LOW (ref 4.22–5.81)
RDW: 14 % (ref 11.5–15.5)

## 2011-10-30 LAB — ABO/RH: ABO/RH(D): O POS

## 2011-10-30 LAB — COMPREHENSIVE METABOLIC PANEL
ALT: 12 U/L (ref 0–53)
AST: 18 U/L (ref 0–37)
Albumin: 3 g/dL — ABNORMAL LOW (ref 3.5–5.2)
Alkaline Phosphatase: 76 U/L (ref 39–117)
CO2: 23 mEq/L (ref 19–32)
Chloride: 103 mEq/L (ref 96–112)
Creatinine, Ser: 1.56 mg/dL — ABNORMAL HIGH (ref 0.50–1.35)
GFR calc non Af Amer: 39 mL/min — ABNORMAL LOW (ref >90)
Potassium: 4.8 mEq/L (ref 3.5–5.1)
Total Bilirubin: 0.4 mg/dL (ref 0.3–1.2)

## 2011-10-30 MED ORDER — CEFAZOLIN SODIUM-DEXTROSE 2-3 GM-% IV SOLR
2.0000 g | INTRAVENOUS | Status: AC
  Administered 2011-10-31: 2 g via INTRAVENOUS

## 2011-10-30 MED ORDER — ENOXAPARIN SODIUM 30 MG/0.3ML ~~LOC~~ SOLN
30.0000 mg | Freq: Once | SUBCUTANEOUS | Status: AC
  Administered 2011-10-30: 30 mg via SUBCUTANEOUS
  Filled 2011-10-30: qty 0.3

## 2011-10-30 MED ORDER — CHLORHEXIDINE GLUCONATE 4 % EX LIQD: 60.0000 mL | Freq: Once | CUTANEOUS | Status: DC

## 2011-10-30 MED ORDER — INSULIN GLARGINE 100 UNIT/ML ~~LOC~~ SOLN
10.0000 [IU] | Freq: Every day | SUBCUTANEOUS | Status: DC
  Administered 2011-10-30 – 2011-10-31 (×2): 10 [IU] via SUBCUTANEOUS

## 2011-10-30 NOTE — Consult Note (Signed)
Reason for Consult:pain right hip Referring Physician: RUMEAL CULLIPHER is an 77 y.o. male.  HPI: 61 FELL ON 10/28/2011 COULD NOT WALK , CAME TO ER FOR EVAL, RIGHT HIP INTERTROCH FRACTURE C/O SEVERE PAIN NON RADIATING, DEEP ACHING, RIGHT HIP. HE HAS SEVERE MEDICAL ISSUES AC REN FAILURE DKA. MEDICINE HAS CONTROLLED AND HE IS READY FOR SURGERY.   I WAS NOT THE ON CALL SURGEON BUT THE PATIENT REQUESTED MY SERVICES.   Past Medical History  Diagnosis Date  . Gastroparesis   . Diabetes mellitus   . Weight loss   . Pulmonary embolism 12/2010  . DVT (deep venous thrombosis)   . Stroke     Past Surgical History  Procedure Date  . Carpal tunnel release 08/2011    Left hand  . Ankle surgery     Patient states that he had pins place in the left foot  . Back surgery 2008  . Colonoscopy 01/2011  . Upper gastrointestinal endoscopy 01/2011    Family History  Problem Relation Age of Onset  . Diabetes Mother   . Diabetes Father   . Diabetes Sister   . Diabetes Brother   . Diabetes Sister   . Diabetes Son   . Healthy Son   . Healthy Son     Social History:  reports that he quit smoking about 30 years ago. His smoking use included Cigarettes. He has never used smokeless tobacco. He reports that he drinks alcohol. He reports that he does not use illicit drugs.  Allergies: No Known Allergies  Medications: I have reviewed the patient's current medications.  Results for orders placed during the hospital encounter of 10/29/11 (from the past 48 hour(s))  GLUCOSE, CAPILLARY     Status: Abnormal   Collection Time   10/29/11  3:16 AM      Component Value Range Comment   Glucose-Capillary 401 (*) 70 - 99 (mg/dL)   CBC     Status: Abnormal   Collection Time   10/29/11  3:51 AM      Component Value Range Comment   WBC 17.1 (*) 4.0 - 10.5 (K/uL)    RBC 3.75 (*) 4.22 - 5.81 (MIL/uL)    Hemoglobin 11.5 (*) 13.0 - 17.0 (g/dL)    HCT 46.9 (*) 62.9 - 52.0 (%)    MCV 91.5  78.0 - 100.0  (fL)    MCH 30.7  26.0 - 34.0 (pg)    MCHC 33.5  30.0 - 36.0 (g/dL)    RDW 52.8  41.3 - 24.4 (%)    Platelets 286  150 - 400 (K/uL)   DIFFERENTIAL     Status: Abnormal   Collection Time   10/29/11  3:51 AM      Component Value Range Comment   Neutrophils Relative 91 (*) 43 - 77 (%)    Neutro Abs 15.5 (*) 1.7 - 7.7 (K/uL)    Lymphocytes Relative 4 (*) 12 - 46 (%)    Lymphs Abs 0.6 (*) 0.7 - 4.0 (K/uL)    Monocytes Relative 6  3 - 12 (%)    Monocytes Absolute 1.0  0.1 - 1.0 (K/uL)    Eosinophils Relative 0  0 - 5 (%)    Eosinophils Absolute 0.0  0.0 - 0.7 (K/uL)    Basophils Relative 0  0 - 1 (%)    Basophils Absolute 0.0  0.0 - 0.1 (K/uL)   COMPREHENSIVE METABOLIC PANEL     Status: Abnormal   Collection Time  10/29/11  3:51 AM      Component Value Range Comment   Sodium 131 (*) 135 - 145 (mEq/L)    Potassium 5.3 (*) 3.5 - 5.1 (mEq/L)    Chloride 93 (*) 96 - 112 (mEq/L)    CO2 18 (*) 19 - 32 (mEq/L)    Glucose, Bld 494 (*) 70 - 99 (mg/dL)    BUN 38 (*) 6 - 23 (mg/dL)    Creatinine, Ser 1.61 (*) 0.50 - 1.35 (mg/dL)    Calcium 9.1  8.4 - 10.5 (mg/dL)    Total Protein 6.2  6.0 - 8.3 (g/dL)    Albumin 3.5  3.5 - 5.2 (g/dL)    AST 16  0 - 37 (U/L)    ALT 14  0 - 53 (U/L)    Alkaline Phosphatase 96  39 - 117 (U/L)    Total Bilirubin 0.2 (*) 0.3 - 1.2 (mg/dL)    GFR calc non Af Amer 24 (*) >90 (mL/min)    GFR calc Af Amer 28 (*) >90 (mL/min)   TROPONIN I     Status: Normal   Collection Time   10/29/11  5:24 AM      Component Value Range Comment   Troponin I <0.30  <0.30 (ng/mL)   URINALYSIS, ROUTINE W REFLEX MICROSCOPIC     Status: Abnormal   Collection Time   10/29/11  5:55 AM      Component Value Range Comment   Color, Urine YELLOW  YELLOW     Appearance CLEAR  CLEAR     Specific Gravity, Urine 1.010  1.005 - 1.030     pH 5.5  5.0 - 8.0     Glucose, UA >1000 (*) NEGATIVE (mg/dL)    Hgb urine dipstick NEGATIVE  NEGATIVE     Bilirubin Urine NEGATIVE  NEGATIVE      Ketones, ur NEGATIVE  NEGATIVE (mg/dL)    Protein, ur NEGATIVE  NEGATIVE (mg/dL)    Urobilinogen, UA 0.2  0.0 - 1.0 (mg/dL)    Nitrite NEGATIVE  NEGATIVE     Leukocytes, UA NEGATIVE  NEGATIVE    HEMOGLOBIN A1C     Status: Abnormal   Collection Time   10/29/11  5:55 AM      Component Value Range Comment   Hemoglobin A1C 7.3 (*) <5.7 (%)    Mean Plasma Glucose 163 (*) <117 (mg/dL)   URINE MICROSCOPIC-ADD ON     Status: Normal   Collection Time   10/29/11  5:55 AM      Component Value Range Comment   WBC, UA 0-2  <3 (WBC/hpf)   GLUCOSE, CAPILLARY     Status: Abnormal   Collection Time   10/29/11  5:57 AM      Component Value Range Comment   Glucose-Capillary 512 (*) 70 - 99 (mg/dL)   MRSA PCR SCREENING     Status: Normal   Collection Time   10/29/11  6:56 AM      Component Value Range Comment   MRSA by PCR NEGATIVE  NEGATIVE    GLUCOSE, CAPILLARY     Status: Abnormal   Collection Time   10/29/11  8:03 AM      Component Value Range Comment   Glucose-Capillary 421 (*) 70 - 99 (mg/dL)   CBC     Status: Abnormal   Collection Time   10/29/11  8:20 AM      Component Value Range Comment   WBC 14.0 (*) 4.0 - 10.5 (K/uL)  RBC 3.67 (*) 4.22 - 5.81 (MIL/uL)    Hemoglobin 11.2 (*) 13.0 - 17.0 (g/dL)    HCT 16.1 (*) 09.6 - 52.0 (%)    MCV 89.9  78.0 - 100.0 (fL)    MCH 30.5  26.0 - 34.0 (pg)    MCHC 33.9  30.0 - 36.0 (g/dL)    RDW 04.5  40.9 - 81.1 (%)    Platelets 256  150 - 400 (K/uL)   BASIC METABOLIC PANEL     Status: Abnormal   Collection Time   10/29/11  8:32 AM      Component Value Range Comment   Sodium 133 (*) 135 - 145 (mEq/L)    Potassium 5.1  3.5 - 5.1 (mEq/L)    Chloride 98  96 - 112 (mEq/L)    CO2 24  19 - 32 (mEq/L)    Glucose, Bld 415 (*) 70 - 99 (mg/dL)    BUN 42 (*) 6 - 23 (mg/dL)    Creatinine, Ser 9.14 (*) 0.50 - 1.35 (mg/dL)    Calcium 9.1  8.4 - 10.5 (mg/dL)    GFR calc non Af Amer 25 (*) >90 (mL/min)    GFR calc Af Amer 29 (*) >90 (mL/min)   GLUCOSE,  CAPILLARY     Status: Abnormal   Collection Time   10/29/11  9:07 AM      Component Value Range Comment   Glucose-Capillary 359 (*) 70 - 99 (mg/dL)   GLUCOSE, CAPILLARY     Status: Abnormal   Collection Time   10/29/11 10:28 AM      Component Value Range Comment   Glucose-Capillary 293 (*) 70 - 99 (mg/dL)   BASIC METABOLIC PANEL     Status: Abnormal   Collection Time   10/29/11 10:44 AM      Component Value Range Comment   Sodium 135  135 - 145 (mEq/L)    Potassium 4.4  3.5 - 5.1 (mEq/L)    Chloride 101  96 - 112 (mEq/L)    CO2 26  19 - 32 (mEq/L)    Glucose, Bld 250 (*) 70 - 99 (mg/dL)    BUN 44 (*) 6 - 23 (mg/dL)    Creatinine, Ser 7.82 (*) 0.50 - 1.35 (mg/dL)    Calcium 8.6  8.4 - 10.5 (mg/dL)    GFR calc non Af Amer 26 (*) >90 (mL/min)    GFR calc Af Amer 30 (*) >90 (mL/min)   GLUCOSE, CAPILLARY     Status: Abnormal   Collection Time   10/29/11 11:29 AM      Component Value Range Comment   Glucose-Capillary 237 (*) 70 - 99 (mg/dL)   GLUCOSE, CAPILLARY     Status: Abnormal   Collection Time   10/29/11 12:54 PM      Component Value Range Comment   Glucose-Capillary 161 (*) 70 - 99 (mg/dL)   HEMOGLOBIN N5A     Status: Abnormal   Collection Time   10/29/11  1:00 PM      Component Value Range Comment   Hemoglobin A1C 7.5 (*) <5.7 (%)    Mean Plasma Glucose 169 (*) <117 (mg/dL)   GLUCOSE, CAPILLARY     Status: Abnormal   Collection Time   10/29/11  1:54 PM      Component Value Range Comment   Glucose-Capillary 100 (*) 70 - 99 (mg/dL)   GLUCOSE, CAPILLARY     Status: Abnormal   Collection Time   10/29/11  3:32 PM  Component Value Range Comment   Glucose-Capillary 104 (*) 70 - 99 (mg/dL)   GLUCOSE, CAPILLARY     Status: Abnormal   Collection Time   10/29/11  5:06 PM      Component Value Range Comment   Glucose-Capillary 130 (*) 70 - 99 (mg/dL)   GLUCOSE, CAPILLARY     Status: Abnormal   Collection Time   10/29/11  9:30 PM      Component Value Range Comment    Glucose-Capillary 185 (*) 70 - 99 (mg/dL)    Comment 1 Notify RN     COMPREHENSIVE METABOLIC PANEL     Status: Abnormal   Collection Time   10/30/11  4:42 AM      Component Value Range Comment   Sodium 135  135 - 145 (mEq/L)    Potassium 4.8  3.5 - 5.1 (mEq/L)    Chloride 103  96 - 112 (mEq/L)    CO2 23  19 - 32 (mEq/L)    Glucose, Bld 293 (*) 70 - 99 (mg/dL)    BUN 37 (*) 6 - 23 (mg/dL)    Creatinine, Ser 1.19 (*) 0.50 - 1.35 (mg/dL)    Calcium 8.7  8.4 - 10.5 (mg/dL)    Total Protein 5.6 (*) 6.0 - 8.3 (g/dL)    Albumin 3.0 (*) 3.5 - 5.2 (g/dL)    AST 18  0 - 37 (U/L)    ALT 12  0 - 53 (U/L)    Alkaline Phosphatase 76  39 - 117 (U/L)    Total Bilirubin 0.4  0.3 - 1.2 (mg/dL)    GFR calc non Af Amer 39 (*) >90 (mL/min)    GFR calc Af Amer 46 (*) >90 (mL/min)   CBC     Status: Abnormal   Collection Time   10/30/11  4:42 AM      Component Value Range Comment   WBC 8.7  4.0 - 10.5 (K/uL)    RBC 3.34 (*) 4.22 - 5.81 (MIL/uL)    Hemoglobin 10.3 (*) 13.0 - 17.0 (g/dL)    HCT 14.7 (*) 82.9 - 52.0 (%)    MCV 91.0  78.0 - 100.0 (fL)    MCH 30.8  26.0 - 34.0 (pg)    MCHC 33.9  30.0 - 36.0 (g/dL)    RDW 56.2  13.0 - 86.5 (%)    Platelets 243  150 - 400 (K/uL)     Dg Chest 1 View  10/29/2011  *RADIOLOGY REPORT*  Clinical Data: Status post fall; preoperative chest radiograph for right hip fracture.  CHEST - 1 VIEW  Comparison: Chest radiograph performed 12/18/2010 and CT of the chest performed 12/19/2010  Findings: The lungs are well-aerated and clear.  There is no evidence of focal opacification, pleural effusion or pneumothorax.  The cardiomediastinal silhouette is within normal limits.  No acute osseous abnormalities are seen.  Healed left-sided rib fractures are again noted.  IMPRESSION: No acute cardiopulmonary process seen.  Original Report Authenticated By: Tonia Ghent, M.D.   Dg Hip Complete Right  10/29/2011  *RADIOLOGY REPORT*  Clinical Data: Status post fall; right hip  pain.  RIGHT HIP - COMPLETE 2+ VIEW  Comparison: None.  Findings: There is a comminuted intertrochanteric fracture through the proximal right femur, with medially displaced lesser trochanteric fragments.  The right femoral head remains seated at the acetabulum.  There is mild shortening at the fracture site.  The left hip is grossly unremarkable in appearance. The sacroiliac joints are within normal limits.  Mild degenerative change is noted along the lower lumbar spine.  The visualized bowel gas pattern is grossly unremarkable in appearance.  IMPRESSION: Comminuted intertrochanteric fracture through the proximal right femur, with medially displaced lesser trochanteric fragments and mild shortening at the fracture site.  Original Report Authenticated By: Tonia Ghent, M.D.   Ct Head Wo Contrast  10/29/2011  *RADIOLOGY REPORT*  Clinical Data:  Status post fall, with laceration above the right eye.  Concern for cervical spine injury.  CT HEAD WITHOUT CONTRAST AND CT CERVICAL SPINE WITHOUT CONTRAST  Technique:  Multidetector CT imaging of the head and cervical spine was performed following the standard protocol without intravenous contrast.  Multiplanar CT image reconstructions of the cervical spine were also generated.  Comparison: CT of the head performed 11/16/2006, and MRI of the brain performed 07/04/2010  CT HEAD  Findings: There is no evidence of acute infarction, mass lesion, or intra- or extra-axial hemorrhage on CT.  Prominence of the ventricles and sulci reflects moderate cortical volume loss.  Cerebellar atrophy is noted.  Mild periventricular and subcortical white matter change likely reflects small vessel ischemic microangiopathy.  Small chronic lacunar infarcts are seen within the basal ganglia and thalami bilaterally, and likely within the pons.  The cerebral hemispheres demonstrate grossly normal gray-white differentiation.  No mass effect or midline shift is seen.  There is no evidence of  fracture; visualized osseous structures are unremarkable in appearance.  The orbits are within normal limits. Mucosal thickening is noted within the frontal sinuses; the remaining paranasal sinuses and mastoid air cells are well-aerated. The patient's known right supraorbital soft tissue laceration is not well characterized on CT.  IMPRESSION:  1.  No evidence of traumatic intracranial injury or fracture. 2.  Moderate cortical volume loss and scattered small vessel ischemic microangiopathy. 3.  Small chronic lacunar infarcts within the basal ganglia and thalami, and likely within the pons. 4.  Mucosal thickening within the frontal sinuses.  CT CERVICAL SPINE  Findings: There is no evidence of acute fracture or subluxation. There is chronic grade 1 retrolisthesis of C3 on C4, and grade 1 anterolisthesis of C7 on T1; these appear to reflect facet disease. There is mild chronic anterior wedging involving vertebral body T1. There is multilevel disc space narrowing along the cervical and upper thoracic spine.  Associated small anterior and posterior disc osteophyte complexes are noted.  Prevertebral soft tissues are within normal limits.  The thyroid gland is unremarkable in appearance.  The visualized lung apices are clear.  Mild calcification is noted at the carotid bifurcations, more prominent on the right.  No significant soft tissue abnormalities are seen.  IMPRESSION:  1.  No evidence of acute fracture or subluxation along the cervical spine. 2.  Chronic grade 1 retrolisthesis of C3 on C4, and grade 1 anterolisthesis of C7-9 T1, reflecting facet disease.  Mild chronic anterior wedging involving vertebral body T1. 3.  Mild calcification at the carotid bifurcations, more prominent on the right.  Original Report Authenticated By: Tonia Ghent, M.D.   Ct Cervical Spine Wo Contrast  10/29/2011  *RADIOLOGY REPORT*  Clinical Data:  Status post fall, with laceration above the right eye.  Concern for cervical spine  injury.  CT HEAD WITHOUT CONTRAST AND CT CERVICAL SPINE WITHOUT CONTRAST  Technique:  Multidetector CT imaging of the head and cervical spine was performed following the standard protocol without intravenous contrast.  Multiplanar CT image reconstructions of the cervical spine were also generated.  Comparison: CT of the head  performed 11/16/2006, and MRI of the brain performed 07/04/2010  CT HEAD  Findings: There is no evidence of acute infarction, mass lesion, or intra- or extra-axial hemorrhage on CT.  Prominence of the ventricles and sulci reflects moderate cortical volume loss.  Cerebellar atrophy is noted.  Mild periventricular and subcortical white matter change likely reflects small vessel ischemic microangiopathy.  Small chronic lacunar infarcts are seen within the basal ganglia and thalami bilaterally, and likely within the pons.  The cerebral hemispheres demonstrate grossly normal gray-white differentiation.  No mass effect or midline shift is seen.  There is no evidence of fracture; visualized osseous structures are unremarkable in appearance.  The orbits are within normal limits. Mucosal thickening is noted within the frontal sinuses; the remaining paranasal sinuses and mastoid air cells are well-aerated. The patient's known right supraorbital soft tissue laceration is not well characterized on CT.  IMPRESSION:  1.  No evidence of traumatic intracranial injury or fracture. 2.  Moderate cortical volume loss and scattered small vessel ischemic microangiopathy. 3.  Small chronic lacunar infarcts within the basal ganglia and thalami, and likely within the pons. 4.  Mucosal thickening within the frontal sinuses.  CT CERVICAL SPINE  Findings: There is no evidence of acute fracture or subluxation. There is chronic grade 1 retrolisthesis of C3 on C4, and grade 1 anterolisthesis of C7 on T1; these appear to reflect facet disease. There is mild chronic anterior wedging involving vertebral body T1. There is  multilevel disc space narrowing along the cervical and upper thoracic spine.  Associated small anterior and posterior disc osteophyte complexes are noted.  Prevertebral soft tissues are within normal limits.  The thyroid gland is unremarkable in appearance.  The visualized lung apices are clear.  Mild calcification is noted at the carotid bifurcations, more prominent on the right.  No significant soft tissue abnormalities are seen.  IMPRESSION:  1.  No evidence of acute fracture or subluxation along the cervical spine. 2.  Chronic grade 1 retrolisthesis of C3 on C4, and grade 1 anterolisthesis of C7-9 T1, reflecting facet disease.  Mild chronic anterior wedging involving vertebral body T1. 3.  Mild calcification at the carotid bifurcations, more prominent on the right.  Original Report Authenticated By: Tonia Ghent, M.D.   Chest Portable 1 View Post Insertion To Confirm Placement As Interpreted By Radiologist  10/29/2011  *RADIOLOGY REPORT*  Clinical Data: Confirm PICC placement  PORTABLE CHEST - 1 VIEW  Comparison: Chest radiograph 10/29/2011 and chest CT 12/19/2010  Findings: A right upper extremity PICC terminates in the distal superior vena cava, in satisfactory position.  The lungs remain clear.  No evidence of pneumothorax or pleural effusion. Cardiomediastinal silhouette is stable.  At least one healed left- sided rib fracture is again noted.  IMPRESSION: Satisfactory position right upper extremity PICC.  Distal tip is in the distal superior vena cava.  No acute cardiopulmonary disease.  Original Report Authenticated By: Britta Mccreedy, M.D.    Review of Systems  Constitutional: Negative.   HENT: Negative.   Eyes: Negative.   Respiratory: Negative.   Gastrointestinal: Negative.   Genitourinary: Negative.   Skin: Negative.   Neurological: Negative.   Endo/Heme/Allergies: Negative.   Psychiatric/Behavioral: Negative.    Blood pressure 114/67, pulse 78, temperature 98.9 F (37.2 C),  temperature source Oral, resp. rate 17, height 5\' 9"  (1.753 m), weight 67.5 kg (148 lb 13 oz), SpO2 99.00%. Physical Exam  Constitutional: He is oriented to person, place, and time. He appears well-developed and well-nourished.  No distress.  HENT:  Head: Normocephalic and atraumatic.  Eyes: EOM are normal.  Neck: No JVD present. No tracheal deviation present. No thyromegaly present.  Cardiovascular: Normal rate and regular rhythm.   Respiratory: Effort normal and breath sounds normal.  GI: Soft. Bowel sounds are normal.  Musculoskeletal:       Right shoulder: Normal.       Left shoulder: Normal.       Right hip: He exhibits normal range of motion, normal strength, no tenderness, no bony tenderness, no swelling, no crepitus and no deformity.       Left hip: Normal.       Left knee: Normal.       Left ankle: Normal.  Lymphadenopathy:    He has no cervical adenopathy.  Neurological: He is alert and oriented to person, place, and time. He has normal reflexes.  Skin: Skin is warm. He is not diaphoretic.  Psychiatric: He has a normal mood and affect.    Assessment/Plan: RIGHT HIP FRACTURE [facility] NAIL RIGHT HIP   Fuller Canada 10/30/2011, 8:19 AM

## 2011-10-30 NOTE — Progress Notes (Signed)
Subjective: This man was admitted with a fractured right hip. He also was admitted with uncontrolled diabetes with mild acidosis which is corrected now. 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 He has no chest pain, dyspnea or cough. He is hopefully due to have surgery for his right hip later today.           Physical Exam: Blood pressure 114/67, pulse 78, temperature 98.9 F (37.2 C), temperature source Oral, resp. rate 17, height 5\' 9"  (1.753 m), weight 67.5 kg (148 lb 13 oz), SpO2 99.00%. He looks systemically well. Heart sounds are present and in sinus rhythm. Lung fields are clear. He is alert and orientated. His right leg is shortened and externally rotated, typical of fractured right hip.   Investigations:  Recent Results (from the past 240 hour(s))  MRSA PCR SCREENING     Status: Normal   Collection Time   10/29/11  6:56 AM      Component Value Range Status Comment   MRSA by PCR NEGATIVE  NEGATIVE  Final      Basic Metabolic Panel:  Basename 10/30/11 0442 10/29/11 1044  NA 135 135  K 4.8 4.4  CL 103 101  CO2 23 26  GLUCOSE 293* 250*  BUN 37* 44*  CREATININE 1.56* 2.20*  CALCIUM 8.7 8.6  MG -- --  PHOS -- --   Liver Function Tests:  [facility] 10/30/11 0442 10/29/11 0351  AST 18 16  ALT 12 14  ALKPHOS 76 96  BILITOT 0.4 0.2*  PROT 5.6* 6.2  ALBUMIN 3.0* 3.5     CBC:  Basename 10/30/11 0442 10/29/11 0820 10/29/11 0351  WBC 8.7 14.0* --  NEUTROABS -- -- 15.5*  HGB 10.3* 11.2* --  HCT 30.4* 33.0* --  MCV 91.0 89.9 --  PLT 243 256 --    Dg Chest 1 View  10/29/2011  *RADIOLOGY REPORT*  Clinical Data: Status post fall; preoperative chest radiograph for right hip fracture.  CHEST - 1 VIEW  Comparison: Chest radiograph performed 12/18/2010 and CT of the chest performed 12/19/2010  Findings: The lungs are well-aerated and clear.  There is no evidence of focal opacification, pleural effusion or pneumothorax.  The  cardiomediastinal silhouette is within normal limits.  No acute osseous abnormalities are seen.  Healed left-sided rib fractures are again noted.  IMPRESSION: No acute cardiopulmonary process seen.  Original Report Authenticated By: Tonia Ghent, M.D.   Dg Hip Complete Right  10/29/2011  *RADIOLOGY REPORT*  Clinical Data: Status post fall; right hip pain.  RIGHT HIP - COMPLETE 2+ VIEW  Comparison: None.  Findings: There is a comminuted intertrochanteric fracture through the proximal right femur, with medially displaced lesser trochanteric fragments.  The right femoral head remains seated at the acetabulum.  There is mild shortening at the fracture site.  The left hip is grossly unremarkable in appearance. The sacroiliac joints are within normal limits.  Mild degenerative change is noted along the lower lumbar spine.  The visualized bowel gas pattern is grossly unremarkable in appearance.  IMPRESSION: Comminuted intertrochanteric fracture through the proximal right femur, with medially displaced lesser trochanteric fragments and mild shortening at the fracture site.  Original Report Authenticated By: Tonia Ghent, M.D.   Ct Head Wo Contrast  10/29/2011  *RADIOLOGY REPORT*  Clinical Data:  Status post fall, with laceration above the right eye.  Concern for cervical spine injury.  CT HEAD WITHOUT CONTRAST AND CT CERVICAL SPINE WITHOUT CONTRAST  Technique:  Multidetector CT imaging of the head and cervical spine was performed following the standard protocol without intravenous contrast.  Multiplanar CT image reconstructions of the cervical spine were also generated.  Comparison: CT of the head performed 11/16/2006, and MRI of the brain performed 07/04/2010  CT HEAD  Findings: There is no evidence of acute infarction, mass lesion, or intra- or extra-axial hemorrhage on CT.  Prominence of the ventricles and sulci reflects moderate cortical volume loss.  Cerebellar atrophy is noted.  Mild periventricular and  subcortical white matter change likely reflects small vessel ischemic microangiopathy.  Small chronic lacunar infarcts are seen within the basal ganglia and thalami bilaterally, and likely within the pons.  The cerebral hemispheres demonstrate grossly normal gray-white differentiation.  No mass effect or midline shift is seen.  There is no evidence of fracture; visualized osseous structures are unremarkable in appearance.  The orbits are within normal limits. Mucosal thickening is noted within the frontal sinuses; the remaining paranasal sinuses and mastoid air cells are well-aerated. The patient's known right supraorbital soft tissue laceration is not well characterized on CT.  IMPRESSION:  1.  No evidence of traumatic intracranial injury or fracture. 2.  Moderate cortical volume loss and scattered small vessel ischemic microangiopathy. 3.  Small chronic lacunar infarcts within the basal ganglia and thalami, and likely within the pons. 4.  Mucosal thickening within the frontal sinuses.  CT CERVICAL SPINE  Findings: There is no evidence of acute fracture or subluxation. There is chronic grade 1 retrolisthesis of C3 on C4, and grade 1 anterolisthesis of C7 on T1; these appear to reflect facet disease. There is mild chronic anterior wedging involving vertebral body T1. There is multilevel disc space narrowing along the cervical and upper thoracic spine.  Associated small anterior and posterior disc osteophyte complexes are noted.  Prevertebral soft tissues are within normal limits.  The thyroid gland is unremarkable in appearance.  The visualized lung apices are clear.  Mild calcification is noted at the carotid bifurcations, more prominent on the right.  No significant soft tissue abnormalities are seen.  IMPRESSION:  1.  No evidence of acute fracture or subluxation along the cervical spine. 2.  Chronic grade 1 retrolisthesis of C3 on C4, and grade 1 anterolisthesis of C7-9 T1, reflecting facet disease.  Mild chronic  anterior wedging involving vertebral body T1. 3.  Mild calcification at the carotid bifurcations, more prominent on the right.  Original Report Authenticated By: Tonia Ghent, M.D.   Ct Cervical Spine Wo Contrast  10/29/2011  *RADIOLOGY REPORT*  Clinical Data:  Status post fall, with laceration above the right eye.  Concern for cervical spine injury.  CT HEAD WITHOUT CONTRAST AND CT CERVICAL SPINE WITHOUT CONTRAST  Technique:  Multidetector CT imaging of the head and cervical spine was performed following the standard protocol without intravenous contrast.  Multiplanar CT image reconstructions of the cervical spine were also generated.  Comparison: CT of the head performed 11/16/2006, and MRI of the brain performed 07/04/2010  CT HEAD  Findings: There is no evidence of acute infarction, mass lesion, or intra- or extra-axial hemorrhage on CT.  Prominence of the ventricles and sulci reflects moderate cortical volume loss.  Cerebellar atrophy is noted.  Mild periventricular and subcortical white matter change likely reflects small vessel ischemic microangiopathy.  Small chronic lacunar infarcts are seen within the basal ganglia and thalami bilaterally, and likely within the pons.  The cerebral hemispheres demonstrate grossly normal gray-white differentiation.  No mass effect or  midline shift is seen.  There is no evidence of fracture; visualized osseous structures are unremarkable in appearance.  The orbits are within normal limits. Mucosal thickening is noted within the frontal sinuses; the remaining paranasal sinuses and mastoid air cells are well-aerated. The patient's known right supraorbital soft tissue laceration is not well characterized on CT.  IMPRESSION:  1.  No evidence of traumatic intracranial injury or fracture. 2.  Moderate cortical volume loss and scattered small vessel ischemic microangiopathy. 3.  Small chronic lacunar infarcts within the basal ganglia and thalami, and likely within the pons. 4.   Mucosal thickening within the frontal sinuses.  CT CERVICAL SPINE  Findings: There is no evidence of acute fracture or subluxation. There is chronic grade 1 retrolisthesis of C3 on C4, and grade 1 anterolisthesis of C7 on T1; these appear to reflect facet disease. There is mild chronic anterior wedging involving vertebral body T1. There is multilevel disc space narrowing along the cervical and upper thoracic spine.  Associated small anterior and posterior disc osteophyte complexes are noted.  Prevertebral soft tissues are within normal limits.  The thyroid gland is unremarkable in appearance.  The visualized lung apices are clear.  Mild calcification is noted at the carotid bifurcations, more prominent on the right.  No significant soft tissue abnormalities are seen.  IMPRESSION:  1.  No evidence of acute fracture or subluxation along the cervical spine. 2.  Chronic grade 1 retrolisthesis of C3 on C4, and grade 1 anterolisthesis of C7-9 T1, reflecting facet disease.  Mild chronic anterior wedging involving vertebral body T1. 3.  Mild calcification at the carotid bifurcations, more prominent on the right.  Original Report Authenticated By: Tonia Ghent, M.D.   Chest Portable 1 View Post Insertion To Confirm Placement As Interpreted By Radiologist  10/29/2011  *RADIOLOGY REPORT*  Clinical Data: Confirm PICC placement  PORTABLE CHEST - 1 VIEW  Comparison: Chest radiograph 10/29/2011 and chest CT 12/19/2010  Findings: A right upper extremity PICC terminates in the distal superior vena cava, in satisfactory position.  The lungs remain clear.  No evidence of pneumothorax or pleural effusion. Cardiomediastinal silhouette is stable.  At least one healed left- sided rib fracture is again noted.  IMPRESSION: Satisfactory position right upper extremity PICC.  Distal tip is in the distal superior vena cava.  No acute cardiopulmonary disease.  Original Report Authenticated By: Britta Mccreedy, M.D.      Medications: I  have reviewed the patient's current medications.  Impression: 1. Fracture right hip. 2. Uncontrolled diabetes with mild acidosis, corrected and acute renal failure, improving. 3. Anemia.     Plan: 1. Reduce intravenous fluids slightly. 2. Increase Lantus insulin. 3. Await right hip surgery, probably today. 4. Monitor hemoglobin closely. It will be important in particular for this man to mobilize early in view of his previous history of DVT and PE.     LOS: 1 day   Allyna Pittsley C 10/30/2011, 9:40 AM

## 2011-10-31 ENCOUNTER — Inpatient Hospital Stay (HOSPITAL_COMMUNITY): Payer: Medicare Other

## 2011-10-31 ENCOUNTER — Encounter (HOSPITAL_COMMUNITY): Payer: Self-pay | Admitting: Anesthesiology

## 2011-10-31 ENCOUNTER — Inpatient Hospital Stay (HOSPITAL_COMMUNITY): Payer: Medicare Other | Admitting: Anesthesiology

## 2011-10-31 ENCOUNTER — Encounter (HOSPITAL_COMMUNITY)
Admission: EM | Disposition: A | Discharge status: Skilled Nursing Facility | Payer: Self-pay | Source: Home / Self Care | Attending: Internal Medicine

## 2011-10-31 ENCOUNTER — Encounter (HOSPITAL_COMMUNITY): Payer: Self-pay | Admitting: *Deleted

## 2011-10-31 HISTORY — PX: ORIF HIP FRACTURE: SHX2125

## 2011-10-31 LAB — GLUCOSE, CAPILLARY
Glucose-Capillary: 214 mg/dL — ABNORMAL HIGH (ref 70–99)
Glucose-Capillary: 258 mg/dL — ABNORMAL HIGH (ref 70–99)
Glucose-Capillary: 307 mg/dL — ABNORMAL HIGH (ref 70–99)

## 2011-10-31 LAB — COMPREHENSIVE METABOLIC PANEL
Alkaline Phosphatase: 88 U/L (ref 39–117)
BUN: 22 mg/dL (ref 6–23)
CO2: 26 mEq/L (ref 19–32)
Chloride: 106 mEq/L (ref 96–112)
GFR calc Af Amer: 74 mL/min — ABNORMAL LOW (ref >90)
GFR calc non Af Amer: 64 mL/min — ABNORMAL LOW (ref >90)
Glucose, Bld: 212 mg/dL — ABNORMAL HIGH (ref 70–99)
Potassium: 3.8 mEq/L (ref 3.5–5.1)
Total Bilirubin: 0.4 mg/dL (ref 0.3–1.2)
Total Protein: 4.8 g/dL — ABNORMAL LOW (ref 6.0–8.3)

## 2011-10-31 LAB — CBC
HCT: 26.1 % — ABNORMAL LOW (ref 39.0–52.0)
Hemoglobin: 8.6 g/dL — ABNORMAL LOW (ref 13.0–17.0)
MCHC: 33 g/dL (ref 30.0–36.0)

## 2011-10-31 SURGERY — OPEN REDUCTION INTERNAL FIXATION HIP
Anesthesia: Spinal | Site: Hip | Laterality: Right | Wound class: Clean

## 2011-10-31 MED ORDER — FLEET ENEMA 7-19 GM/118ML RE ENEM: 1.0000 | ENEMA | Freq: Every day | RECTAL | Status: DC | PRN

## 2011-10-31 MED ORDER — MAGNESIUM HYDROXIDE 400 MG/5ML PO SUSP: 30.0000 mL | Freq: Two times a day (BID) | ORAL | Status: DC | PRN

## 2011-10-31 MED ORDER — ENOXAPARIN SODIUM 30 MG/0.3ML ~~LOC~~ SOLN
30.0000 mg | Freq: Two times a day (BID) | SUBCUTANEOUS | Status: DC
  Administered 2011-11-01 – 2011-11-04 (×7): 30 mg via SUBCUTANEOUS
  Filled 2011-10-31 (×7): qty 0.3

## 2011-10-31 MED ORDER — MUPIROCIN 2 % EX OINT
TOPICAL_OINTMENT | CUTANEOUS | Status: AC
  Administered 2011-10-31: 1 via NASAL
  Filled 2011-10-31: qty 22

## 2011-10-31 MED ORDER — BUPIVACAINE-EPINEPHRINE PF 0.5-1:200000 % IJ SOLN
INTRAMUSCULAR | Status: AC
  Filled 2011-10-31: qty 20

## 2011-10-31 MED ORDER — CEFAZOLIN SODIUM 1-5 GM-% IV SOLN
INTRAVENOUS | Status: AC
  Filled 2011-10-31: qty 50

## 2011-10-31 MED ORDER — METHOCARBAMOL 500 MG PO TABS
500.0000 mg | ORAL_TABLET | Freq: Four times a day (QID) | ORAL | Status: DC | PRN
  Administered 2011-10-31 (×2): 500 mg via ORAL
  Filled 2011-10-31 (×2): qty 1

## 2011-10-31 MED ORDER — METHOCARBAMOL 100 MG/ML IJ SOLN
500.0000 mg | Freq: Four times a day (QID) | INTRAVENOUS | Status: DC | PRN
  Filled 2011-10-31: qty 5

## 2011-10-31 MED ORDER — FENTANYL CITRATE 0.05 MG/ML IJ SOLN
INTRAMUSCULAR | Status: DC | PRN
  Administered 2011-10-31: 12.5 ug via INTRATHECAL

## 2011-10-31 MED ORDER — FENTANYL CITRATE 0.05 MG/ML IJ SOLN
INTRAMUSCULAR | Status: DC | PRN
  Administered 2011-10-31 (×2): 12.5 ug via INTRAVENOUS
  Administered 2011-10-31 (×2): 25 ug via INTRAVENOUS
  Administered 2011-10-31: 12.5 ug via INTRAVENOUS

## 2011-10-31 MED ORDER — POLYETHYLENE GLYCOL 3350 17 G PO PACK: 17.0000 g | PACK | Freq: Every day | ORAL | Status: DC | PRN

## 2011-10-31 MED ORDER — BISACODYL 5 MG PO TBEC: 10.0000 mg | DELAYED_RELEASE_TABLET | Freq: Every day | ORAL | Status: DC | PRN

## 2011-10-31 MED ORDER — LACTATED RINGERS IV SOLN
INTRAVENOUS | Status: DC
  Administered 2011-10-31: 1000 mL via INTRAVENOUS
  Administered 2011-10-31: 22:00:00 via INTRAVENOUS

## 2011-10-31 MED ORDER — HYDROCODONE-ACETAMINOPHEN 5-325 MG PO TABS
1.0000 | ORAL_TABLET | ORAL | Status: DC | PRN
  Administered 2011-10-31 – 2011-11-01 (×2): 1 via ORAL
  Administered 2011-11-01: 2 via ORAL
  Administered 2011-11-02: 1 via ORAL
  Administered 2011-11-03: 2 via ORAL
  Administered 2011-11-04 (×2): 1 via ORAL
  Filled 2011-10-31 (×2): qty 1
  Filled 2011-10-31 (×2): qty 2
  Filled 2011-10-31 (×3): qty 1

## 2011-10-31 MED ORDER — ONDANSETRON HCL 4 MG/2ML IJ SOLN: 4.0000 mg | Freq: Once | INTRAMUSCULAR | Status: AC | PRN

## 2011-10-31 MED ORDER — PROPOFOL 10 MG/ML IV EMUL
INTRAVENOUS | Status: AC
  Filled 2011-10-31: qty 20

## 2011-10-31 MED ORDER — FENTANYL CITRATE 0.05 MG/ML IJ SOLN
INTRAMUSCULAR | Status: AC
  Filled 2011-10-31: qty 2

## 2011-10-31 MED ORDER — FENTANYL CITRATE 0.05 MG/ML IJ SOLN: 25.0000 ug | INTRAMUSCULAR | Status: DC | PRN

## 2011-10-31 MED ORDER — LIDOCAINE HCL (PF) 1 % IJ SOLN
INTRAMUSCULAR | Status: AC
  Filled 2011-10-31: qty 5

## 2011-10-31 MED ORDER — MUPIROCIN 2 % EX OINT
TOPICAL_OINTMENT | Freq: Two times a day (BID) | CUTANEOUS | Status: DC
  Administered 2011-10-31: 1 via NASAL
  Administered 2011-10-31 – 2011-11-03 (×6): via NASAL
  Filled 2011-10-31: qty 22

## 2011-10-31 MED ORDER — BUPIVACAINE IN DEXTROSE 0.75-8.25 % IT SOLN
INTRATHECAL | Status: AC
  Filled 2011-10-31: qty 2

## 2011-10-31 MED ORDER — LIDOCAINE HCL (CARDIAC) 10 MG/ML IV SOLN
INTRAVENOUS | Status: DC | PRN
  Administered 2011-10-31: 10 mg via INTRAVENOUS

## 2011-10-31 MED ORDER — SODIUM CHLORIDE 0.9 % IR SOLN
Status: DC | PRN
  Administered 2011-10-31: 1000 mL

## 2011-10-31 MED ORDER — BUPIVACAINE-EPINEPHRINE PF 0.5-1:200000 % IJ SOLN
INTRAMUSCULAR | Status: DC | PRN
  Administered 2011-10-31: 60 mL

## 2011-10-31 MED ORDER — MIDAZOLAM HCL 2 MG/2ML IJ SOLN
INTRAMUSCULAR | Status: AC
  Administered 2011-10-31: 2 mg via INTRAVENOUS
  Filled 2011-10-31: qty 2

## 2011-10-31 MED ORDER — CEFAZOLIN SODIUM 1-5 GM-% IV SOLN
1.0000 g | Freq: Four times a day (QID) | INTRAVENOUS | Status: AC
  Administered 2011-10-31 (×3): 1 g via INTRAVENOUS
  Filled 2011-10-31 (×4): qty 50

## 2011-10-31 MED ORDER — BUPIVACAINE IN DEXTROSE 0.75-8.25 % IT SOLN
INTRATHECAL | Status: DC | PRN
  Administered 2011-10-31: 15 mg via INTRATHECAL

## 2011-10-31 MED ORDER — CEFAZOLIN SODIUM-DEXTROSE 2-3 GM-% IV SOLR
INTRAVENOUS | Status: AC
  Filled 2011-10-31: qty 50

## 2011-10-31 MED ORDER — MIDAZOLAM HCL 2 MG/2ML IJ SOLN
1.0000 mg | INTRAMUSCULAR | Status: DC | PRN
  Administered 2011-10-31: 2 mg via INTRAVENOUS

## 2011-10-31 MED ORDER — PROPOFOL 10 MG/ML IV EMUL
INTRAVENOUS | Status: DC | PRN
  Administered 2011-10-31: 25 ug/kg/min via INTRAVENOUS

## 2011-10-31 MED ORDER — PHENOL 1.4 % MT LIQD
1.0000 | OROMUCOSAL | Status: DC | PRN
  Filled 2011-10-31: qty 177

## 2011-10-31 MED ORDER — MENTHOL 3 MG MT LOZG
1.0000 | LOZENGE | OROMUCOSAL | Status: DC | PRN
  Filled 2011-10-31: qty 9

## 2011-10-31 SURGICAL SUPPLY — 56 items
BAG HAMPER (MISCELLANEOUS) ×2 IMPLANT
BANDAGE GAUZE ELAST BULKY 4 IN (GAUZE/BANDAGES/DRESSINGS) ×2 IMPLANT
BIT DRILL AO GAMMA 4.2X300 (BIT) ×2 IMPLANT
BLADE SURG SZ10 CARB STEEL (BLADE) ×4 IMPLANT
CHLORAPREP W/TINT 26ML (MISCELLANEOUS) ×2 IMPLANT
CLOTH BEACON ORANGE TIMEOUT ST (SAFETY) ×2 IMPLANT
COVER LIGHT HANDLE STERIS (MISCELLANEOUS) ×4 IMPLANT
COVER MAYO STAND XLG (DRAPE) ×2 IMPLANT
DECANTER SPIKE VIAL GLASS SM (MISCELLANEOUS) ×2 IMPLANT
DRAIN TROCAR  MED 1/8 (DRAIN) IMPLANT
DRAPE STERI IOBAN 125X83 (DRAPES) ×2 IMPLANT
DRSG MEPILEX BORDER 4X12 (GAUZE/BANDAGES/DRESSINGS) ×2 IMPLANT
ELECT CAUTERY BLADE 6.4 (BLADE) ×2 IMPLANT
ELECT REM PT RETURN 9FT ADLT (ELECTROSURGICAL) ×2
ELECTRODE REM PT RTRN 9FT ADLT (ELECTROSURGICAL) ×1 IMPLANT
GLOVE BIOGEL PI IND STRL 7.0 (GLOVE) ×1 IMPLANT
GLOVE BIOGEL PI IND STRL 8.5 (GLOVE) ×1 IMPLANT
GLOVE BIOGEL PI INDICATOR 7.0 (GLOVE) ×1
GLOVE BIOGEL PI INDICATOR 8.5 (GLOVE) ×1
GLOVE ECLIPSE 6.5 STRL STRAW (GLOVE) ×2 IMPLANT
GLOVE ECLIPSE 8.0 STRL XLNG CF (GLOVE) ×2 IMPLANT
GLOVE SKINSENSE NS SZ8.0 LF (GLOVE) ×1
GLOVE SKINSENSE STRL SZ8.0 LF (GLOVE) ×1 IMPLANT
GLOVE SS N UNI LF 8.5 STRL (GLOVE) ×4 IMPLANT
GOWN STRL REIN 3XL LVL4 (GOWN DISPOSABLE) ×2 IMPLANT
GOWN STRL REIN XL XLG (GOWN DISPOSABLE) ×6 IMPLANT
GUIDEROD T2 3X1000 (ROD) ×2 IMPLANT
INST SET MAJOR BONE (KITS) ×2 IMPLANT
K-WIRE  3.2X450M STR (WIRE) ×1
K-WIRE 3.2X450M STR (WIRE) ×1
KIT BLADEGUARD II DBL (SET/KITS/TRAYS/PACK) ×2 IMPLANT
KIT ROOM TURNOVER AP CYSTO (KITS) ×2 IMPLANT
KWIRE 3.2X450M STR (WIRE) ×1 IMPLANT
MANIFOLD NEPTUNE II (INSTRUMENTS) ×2 IMPLANT
MARKER SKIN DUAL TIP RULER LAB (MISCELLANEOUS) ×2 IMPLANT
NAIL TROCH GAMMA 11X18 (Nail) ×2 IMPLANT
NEEDLE HYPO 21X1.5 SAFETY (NEEDLE) ×2 IMPLANT
NEEDLE SPNL 18GX3.5 QUINCKE PK (NEEDLE) ×2 IMPLANT
NS IRRIG 1000ML POUR BTL (IV SOLUTION) ×2 IMPLANT
PACK BASIC III (CUSTOM PROCEDURE TRAY) ×1
PACK SRG BSC III STRL LF ECLPS (CUSTOM PROCEDURE TRAY) ×1 IMPLANT
PAD ABD 5X9 TENDERSORB (GAUZE/BANDAGES/DRESSINGS) IMPLANT
PAD CAST 4YDX4 CTTN HI CHSV (CAST SUPPLIES) IMPLANT
PADDING CAST COTTON 4X4 STRL (CAST SUPPLIES)
PENCIL HANDSWITCHING (ELECTRODE) ×2 IMPLANT
SCREW LAG GAMMA 3 TI 10.5X90MM (Screw) ×2 IMPLANT
SCREW LOCKING T2 F/T  5X42.5MM (Screw) ×1 IMPLANT
SCREW LOCKING T2 F/T 5X42.5MM (Screw) ×1 IMPLANT
SET BASIN LINEN APH (SET/KITS/TRAYS/PACK) ×2 IMPLANT
SPONGE LAP 18X18 X RAY DECT (DISPOSABLE) ×4 IMPLANT
STAPLER VISISTAT 35W (STAPLE) ×2 IMPLANT
SUT MON AB 0 CT1 (SUTURE) ×4 IMPLANT
SUT MON AB 2-0 CT1 36 (SUTURE) ×2 IMPLANT
SYR 30ML LL (SYRINGE) ×2 IMPLANT
SYR BULB IRRIGATION 50ML (SYRINGE) ×4 IMPLANT
YANKAUER SUCT 12FT TUBE ARGYLE (SUCTIONS) ×2 IMPLANT

## 2011-10-31 NOTE — Brief Op Note (Signed)
10/29/2011 - 10/31/2011  10:14 AM  PATIENT:  Eduardo Lee  77 y.o. male  PRE-OPERATIVE DIAGNOSIS:  IntertrochantericFracture Right Hip  POST-OPERATIVE DIAGNOSIS:  same PROCEDURE:  Procedure(s): OPEN REDUCTION INTERNAL FIXATION RIGHT HIP / GAMMA NAIL  125 SHORT, 90 LAG AND 42 DISTAL LOCKING  PROXIMAL ACORN/SLIDING MODE  SURGEON:  Surgeon(s): Fuller Canada, MD  PHYSICIAN ASSISTANT:   ASSISTANTS: RON HARRIS   ANESTHESIA:   spinal  EBL:100CC   Total I/O In: 3450 [I.V.:2400; Blood:1050] Out: 600 [Urine:500; Blood:100]  BLOOD ADMINISTERED:1 UNIT PRE APINAL AND 1 INTRA OP  CC PRBC  DRAINS: none   LOCAL MEDICATIONS USED:  MARCAINE WITH EPI 60 CC  SPECIMEN:  No Specimen  DISPOSITION OF SPECIMEN:  N/A  COUNTS:  YES  TOURNIQUET:  * No tourniquets in log *  DICTATION: .Dragon Dictation  PLAN OF CARE: Admit to inpatient   PATIENT DISPOSITION:  PACU - hemodynamically stable.   Delay start of Pharmacological VTE agent (>24hrs) due to surgical blood loss or risk of bleeding: YES

## 2011-10-31 NOTE — Interval H&P Note (Signed)
History and Physical Interval Note:  10/31/2011 7:25 AM  Eduardo Lee  has presented today for surgery, with the diagnosis of fracture hip  The various methods of treatment have been discussed with the patient and family. After consideration of risks, benefits and other options for treatment, the patient has consented to  Procedure(s): OPEN REDUCTION INTERNAL FIXATION RIGHT HIP as a surgical intervention .  The patients' history has been reviewed, patient RE-examined, no change in status, stable for surgery.  I have reviewed the patients' chart and labs.  Questions were answered to the patient's satisfaction.    HE HAS A SWOLLEN RIGHT TOE WHICH IS BRUISED BUT NON TENDER   HG 8.8 TRANSFUSE PRE-OP/FRACTURE RELATED, VOLUME RELATED   Fuller Canada

## 2011-10-31 NOTE — Anesthesia Preprocedure Evaluation (Addendum)
Anesthesia Evaluation  Patient identified by MRN, date of birth, ID band Patient awake    Reviewed: Allergy & Precautions, H&P , NPO status , Patient's Chart, lab work & pertinent test results  History of Anesthesia Complications Negative for: history of anesthetic complications  Airway Mallampati: II      Dental  (+) Teeth Intact and Implants   Pulmonary former smoker Hx DVT's and PE 12/2010.    Pulmonary exam normal       Cardiovascular neg cardio ROS Regular Normal    Neuro/Psych TIA   GI/Hepatic gastroparesis   Endo/Other  Diabetes mellitus-, Well Controlled, Type 2, Insulin Dependent  Renal/GU      Musculoskeletal   Abdominal   Peds  Hematology   Anesthesia Other Findings   Reproductive/Obstetrics                           Anesthesia Physical Anesthesia Plan  ASA: III  Anesthesia Plan: Spinal   Post-op Pain Management:    Induction: Intravenous  Airway Management Planned: Nasal Cannula  Additional Equipment:   Intra-op Plan:   Post-operative Plan:   Informed Consent: I have reviewed the patients History and Physical, chart, labs and discussed the procedure including the risks, benefits and alternatives for the proposed anesthesia with the patient or authorized representative who has indicated his/her understanding and acceptance.     Plan Discussed with:   Anesthesia Plan Comments:         Anesthesia Quick Evaluation

## 2011-10-31 NOTE — Anesthesia Postprocedure Evaluation (Signed)
Anesthesia Post Note  Patient: Eduardo Lee  Procedure(s) Performed:  OPEN REDUCTION INTERNAL FIXATION HIP - with Gamma Nail  Anesthesia type: Spinal  Patient location: PACU  Post pain: Pain level controlled  Post assessment: Post-op Vital signs reviewed, Patient's Cardiovascular Status Stable, Respiratory Function Stable, Patent Airway, No signs of Nausea or vomiting and Pain level controlled  Last Vitals:  Filed Vitals:   10/31/11 1013  BP: 124/44  Pulse: 89  Temp: 36.4 C  Resp: 16    Post vital signs: Reviewed and stable  Level of consciousness: awake and alert   Complications: No apparent anesthesia complications

## 2011-10-31 NOTE — Anesthesia Procedure Notes (Addendum)
Date/Time: 10/31/2011 8:10 AM Performed by: Minerva Areola Pre-anesthesia Checklist: Patient identified, Patient being monitored, Emergency Drugs available, Timeout performed and Suction available Patient Re-evaluated:Patient Re-evaluated prior to inductionOxygen Delivery Method: Nasal Cannula    Spinal  Patient location during procedure: OR Start time: 10/31/2011 8:40 AM End time: 10/31/2011 8:49 AM Staffing Anesthesiologist: Laurene Footman CRNA/Resident: Minerva Areola Preanesthetic Checklist Completed: patient identified, site marked, surgical consent, pre-op evaluation, timeout performed, IV checked, risks and benefits discussed and monitors and equipment checked Spinal Block Patient position: right lateral decubitus Prep: Betadine and prep x 3 Patient monitoring: heart rate, cardiac monitor, continuous pulse ox and blood pressure Approach: right paramedian (1% lidocaine skinwheal paramedian 1 cc) Location: L4-5 Injection technique: single-shot Needle Needle type: Spinocan  Needle gauge: 22 G Needle length: 9 cm Assessment Sensory level: T8 (level at 0900) Events: clear  csf pre and post injection Additional Notes Skin localization 1% Lidocaine Marcaine 15 mg/ Fentanyl 12.5 mcg Tray Lot [id number] Tray expiration: 2013-09 T.Lovelyn Sheeran CRNA attempted insertion x1. Unsuccessful. Dr. Jayme Cloud in room. 2 attempts made with one successful insertion. Positive Heme. Aspirated and clear CSF obtained. Medicines injected and clear CFS post injection.  Date/Time: 10/31/2011 9:15 AM Performed by: Minerva Areola Oxygen Delivery Method: Simple face mask

## 2011-10-31 NOTE — Clinical Documentation Improvement (Signed)
Anemia Blood Loss Clarification  THIS DOCUMENT IS NOT A PERMANENT PART OF THE MEDICAL RECORD  RESPOND TO THE THIS QUERY, FOLLOW THE INSTRUCTIONS BELOW:  1. If needed, update documentation for the patient's encounter via the notes activity.  2. Access this query again and click edit on the [employer].  3. After updating, or not, click F2 to complete all highlighted (required) fields concerning your review. Select "additional documentation in the medical record" OR "no additional documentation provided".  4. Click Sign note button.  5. The deficiency will fall out of your InBasket *Please let us know if you are not able to compete this workflow by phone or e-mail (listed below).        10/31/11  Dear Dr. Romeo Apple Marton Redwood  In an effort to better capture your patient's severity of illness, reflect appropriate length of stay and utilization of resources, a review of the patient medical record has revealed the following indicators.    Based on your clinical judgment, please clarify and document in a progress note and/or discharge summary the clinical condition associated with the following supporting information:  In responding to this query please exercise your independent judgment.  The fact that a query is asked, does not imply that any particular answer is desired or expected.  Possible Clinical Conditions?   " Expected Acute Blood Loss Anemia  " Acute Blood Loss Anemia " Acute on chronic blood loss anemia  " Chronic blood loss anemia  " Precipitous drop in Hematocrit  " Other Condition________________  " Cannot Clinically Determine    Clinical Information:   Risk Factors:  Dr. Mort Sawyers Interval H&P=HG 8.8 TRANSFUSE PRE-OP/FRACTURE RELATED, VOLUME RELATED  Fractured hip  Diagnostics: H/H 11/27=11.5/34.3 11/28=10.3/30.4 11/29=8.6/26.1  Treatments: Transfusion: 1 unit PRBC H&H monitoring    Reviewed: No response  Thank You,  Harless Litten RN,  MSN Clinical Documentation Specialist: Office# [phone] [facility] [place]

## 2011-10-31 NOTE — Progress Notes (Signed)
Patient taken to O.R. in stable condition.  Son accompanied patient to waiting area.

## 2011-10-31 NOTE — Progress Notes (Signed)
Subjective: This man has just come back from the operating room having had his fractured right hip fixed. Interestingly, he has a bruised right big toe.           Physical Exam: Blood pressure 124/49, pulse 88, temperature 98 F (36.7 C), temperature source Oral, resp. rate 12, height 5\' 9"  (1.753 m), weight 67.5 kg (148 lb 13 oz), SpO2 98.00%. He looks systemically well. Heart sounds are present and in sinus rhythm. Lung fields are clear. He is alert and orientated.   Investigations:  Recent Results (from the past 240 hour(s))  MRSA PCR SCREENING     Status: Normal   Collection Time   10/29/11  6:56 AM      Component Value Range Status Comment   MRSA by PCR NEGATIVE  NEGATIVE  Final   SURGICAL PCR SCREEN     Status: Normal   Collection Time   10/31/11  6:20 AM      Component Value Range Status Comment   MRSA, PCR NEGATIVE  NEGATIVE  Final    Staphylococcus aureus NEGATIVE  NEGATIVE  Final      Basic Metabolic Panel:  Basename 10/31/11 0401 10/30/11 0442  NA 137 135  K 3.8 4.8  CL 106 103  CO2 26 23  GLUCOSE 212* 293*  BUN 22 37*  CREATININE 1.05 1.56*  CALCIUM 7.7* 8.7  MG -- --  PHOS -- --   Liver Function Tests:  [facility] 10/31/11 0401 10/30/11 0442  AST 16 18  ALT 10 12  ALKPHOS 88 76  BILITOT 0.4 0.4  PROT 4.8* 5.6*  ALBUMIN 2.4* 3.0*     CBC:  Basename 10/31/11 0401 10/30/11 0442 10/29/11 0351  WBC 6.7 8.7 --  NEUTROABS -- -- 15.5*  HGB 8.6* 10.3* --  HCT 26.1* 30.4* --  MCV 90.9 91.0 --  PLT 208 243 --    No results found.    Medications: I have reviewed the patient's current medications.  Impression: 1. Fracture right hip, status post surgery. 2. Diabetes, controlled better and acute renal failure, resolved. 3. Anemia.     Plan: 1. X-ray right foot to make sure there is no fracture. 2. Continue with postoperative care for right hip fracture surgery. Early mobilization will be imperative in this man..     LOS: 2 days    GOSRANI,NIMISH C 10/31/2011, 11:41 AM

## 2011-10-31 NOTE — Op Note (Signed)
10/29/2011 - 10/31/2011  10:14 AM  PATIENT:  Eduardo Lee  77 y.o. male  PRE-OPERATIVE DIAGNOSIS:  IntertrochantericFracture Right Hip  POST-OPERATIVE DIAGNOSIS:  same PROCEDURE:  Procedure(s): OPEN REDUCTION INTERNAL FIXATION RIGHT HIP / GAMMA NAIL  125 SHORT, 90 LAG AND 42 DISTAL LOCKING  PROXIMAL ACORN/SLIDING MODE  FINDINGS: 3 PART INTERTROCH WITH SMALL LESSER TROCH POSTERIOR MEDIAL BUTRESS INTACT   SURGEON:  Surgeon(s): Fuller Canada, MD  PHYSICIAN ASSISTANT:   ASSISTANTS: RON HARRIS   ANESTHESIA:   spinal  EBL:100CC   Total I/O In: 3450 [I.V.:2400; Blood:1050] Out: 600 [Urine:500; Blood:100]  BLOOD ADMINISTERED:1 UNIT PRE APINAL AND 1 INTRA OP  CC PRBC   Procedure details   The patient was identified in the preop area and the site was marked with surgeon's initials. Patient was reexamined. We did find he had a great toe ecchymotic area looks like it was injured at the time of his fall. We will address this later.  Chart review was performed and consent was reviewed and signed.  The patient was given 2 g of Ancef and brought to the operating room. He was given a spinal anesthetic after 1 unit of blood. He was placed on the fracture table. The well leg was placed in a well leg holder and appropriately padded. The right leg was placed in traction. Peroneal post was placed. The right arm was placed across his chest. The C-arm was brought in and radiographs were obtained. The fracture was reduced with traction and internal rotation. Stable posterior medial buttress was noted. This allowed [place] to use a short 125 gamma nail.  After sterile prep and drape timeout was completed  An incision was made over the greater trochanter subcutaneous tissue was divided electrocautery was used to obtain hemostasis. Blunt dissection was carried out through the gluteus medius musculature and a curved sharp awl was placed through the trochanter. A guidewire was passed down the femoral  canal and confirmed to be in position with radiographs. The guidepin was overreamed with a proximal reamer per technique.  The 125 nail was placed over the guidewire and the guidewire was removed. A sharp incision was made over the proximal femur distal to the trochanter followed by sharp dissection down to the femur. The cannula was placed and the penetrating drill was used to penetrate the lateral cortex followed by the guide pin which was placed in the Center of the femoral head on both AP and lateral x-ray. The guide pin measured 90 mm the triple reamer was set for 90 and passed over the guidewire the guidewire was left in place. A 90 mm lag screw was passed over the guidewire. The proximal portion of the construct was irrigated and the acorn was placed and reversed turned one quarter. The lag screw was confirmed to be stable.  A sharp incision was made over the locking screw area and the cannula sleeve construct was passed to bone confirmed on x-ray. Appropriate drill bit was used to drill the lag screw hole and measured off the drill bit. A 42-1/2 locking bolt was placed. Final radiographs confirmed fracture reduction and also hardware position.  Wounds were irrigated with copious amounts of saline. The proximal wound is closed with 2 layers of 0 Monocryl. The middle incision was closed with 2-0 Monocryl. All incisions were closed with staples. 60 cc of Marcaine with epinephrine injected 30 cc in the deep portion of the wound and 30 cc in the superficial portion of the wound  Sterile dressing  was applied  The patient was taken to the recovery room in stable condition  Postop plan weightbearing as tolerated. Medical consultation will be continued.     DRAINS: none   LOCAL MEDICATIONS USED:  MARCAINE WITH EPI 60 CC  SPECIMEN:  No Specimen  DISPOSITION OF SPECIMEN:  N/A  COUNTS:  YES  TOURNIQUET:  * No tourniquets in log *  DICTATION: .Dragon Dictation  PLAN OF CARE: Admit to  inpatient   PATIENT DISPOSITION:  PACU - hemodynamically stable.   Delay start of Pharmacological VTE agent (>24hrs) due to surgical blood loss or risk of bleeding: YES

## 2011-10-31 NOTE — Transfer of Care (Signed)
Immediate Anesthesia Transfer of Care Note  Patient: Eduardo Lee  Procedure(s) Performed:  OPEN REDUCTION INTERNAL FIXATION HIP - with Gamma Nail  Patient Location: PACU  Anesthesia Type: SAB  Level of Consciousness: awake  Airway & Oxygen Therapy: Patient Spontanous Breathing and nasal cannula  Post-op Assessment: Report given to PACU RN, Post -op Vital signs reviewed and stable. SAB Level  T 12  Post vital signs: Reviewed and stable  Complications: No apparent anesthesia complications

## 2011-11-01 LAB — CBC
Platelets: 224 10*3/uL (ref 150–400)
RBC: 3.8 MIL/uL — ABNORMAL LOW (ref 4.22–5.81)
WBC: 8.4 10*3/uL (ref 4.0–10.5)

## 2011-11-01 LAB — GLUCOSE, CAPILLARY
Glucose-Capillary: 296 mg/dL — ABNORMAL HIGH (ref 70–99)
Glucose-Capillary: 287 mg/dL — ABNORMAL HIGH (ref 70–99)
Glucose-Capillary: 240 mg/dL — ABNORMAL HIGH (ref 70–99)

## 2011-11-01 LAB — BASIC METABOLIC PANEL
CO2: 25 mEq/L (ref 19–32)
Chloride: 102 mEq/L (ref 96–112)
Sodium: 137 mEq/L (ref 135–145)

## 2011-11-01 MED ORDER — INSULIN GLARGINE 100 UNIT/ML ~~LOC~~ SOLN
20.0000 [IU] | Freq: Every day | SUBCUTANEOUS | Status: DC
  Administered 2011-11-01 – 2011-11-02 (×2): 20 [IU] via SUBCUTANEOUS

## 2011-11-01 MED ORDER — VITAMIN B-1 100 MG PO TABS
100.0000 mg | ORAL_TABLET | Freq: Every day | ORAL | Status: DC
  Administered 2011-11-02 – 2011-11-04 (×3): 100 mg via ORAL
  Filled 2011-11-01 (×3): qty 1

## 2011-11-01 MED ORDER — SODIUM CHLORIDE 0.9 % IV SOLN
INTRAVENOUS | Status: DC
  Administered 2011-11-01: 50 mL/h via INTRAVENOUS
  Administered 2011-11-02: 07:00:00 via INTRAVENOUS

## 2011-11-01 MED ORDER — INSULIN ASPART 100 UNIT/ML ~~LOC~~ SOLN
6.0000 [IU] | Freq: Three times a day (TID) | SUBCUTANEOUS | Status: DC
  Administered 2011-11-01 – 2011-11-04 (×8): 6 [IU] via SUBCUTANEOUS

## 2011-11-01 NOTE — Progress Notes (Signed)
Patient transferred to Unit 300 per MD's orders.

## 2011-11-01 NOTE — Progress Notes (Signed)
PT recommending SNF at d/c.  Pt and pt's son are agreeable for CSW to initiate bed search in [place].  CSW to follow up with bed offers when available.  Karn Cassis

## 2011-11-01 NOTE — Progress Notes (Signed)
Physical Therapy Treatment Patient Details Name: Eduardo Lee MRN: [id number] DOB: Aug 29, 1926 Today's Date: 11/01/2011  PT Assessment/Plan  PT - Assessment/Plan Comments on Treatment Session: very cooperative, tolerated transfers, ex well...hopefully can progress to ambulating  a short distance tomorrow PT Plan: Discharge plan remains appropriate PT Frequency: Min 5X/week Follow Up Recommendations: Skilled nursing facility Equipment Recommended: Defer to next venue PT Goals  Acute Rehab PT Goals PT Goal Formulation: With patient Pt will go Supine/Side to Sit: with mod assist Pt will go Sit to Supine/Side: with min assist PT Goal: Sit to Supine/Side - Progress: Revised (modified due to lack of progress/goal met) Pt will Transfer Bed to Chair/Chair to Bed: with min assist PT Transfer Goal: Bed to Chair/Chair to Bed - Progress: Revised (modified due to lack of progress/goal met) Pt will Ambulate: 1 - 15 feet  PT Treatment Precautions/Restrictions  Precautions Precautions: Fall Required Braces or Orthoses: No Restrictions Weight Bearing Restrictions: No Other Position/Activity Restrictions: Pt is WBAT R per MD order Mobility (including Balance) Bed Mobility Bed Mobility: Yes Supine to Sit: 2: Max assist Sitting - Scoot to Edge of Bed: 2: Max assist Sit to Supine - Left: 3: Mod assist Sit to Supine - Left Details (indicate cue type and reason): assist to move RLE into bed Transfers Transfers: Yes Sit to Stand: 4: Min assist Sit to Stand Details (indicate cue type and reason): much more stable with standing, although still tends to fall backward Stand to Sit: 4: Min assist Stand Pivot Transfers: 3: Mod assist Stand Pivot Transfer Details (indicate cue type and reason): needs assist with gait sequence, proper walker position Ambulation/Gait Ambulation/Gait: No Stairs: No Wheelchair Mobility Wheelchair Mobility: No  Balance Balance Assessed: Yes Static Sitting  Balance Static Sitting - Level of Assistance: 7: Independent Dynamic Sitting Balance Dynamic Sitting - Level of Assistance: 6: Modified independent (Device/Increase time);Other (comment) (avoids sitting on R hip) Static Standing Balance Static Standing - Level of Assistance: 3: Mod assist;Other (comment) (tends to fall backward) Exercise  General Exercises - Lower Extremity Ankle Circles/Pumps: AROM;Both;10 reps;Supine Quad Sets: Both;10 reps;Supine Gluteal Sets: AROM;Both;10 reps;Supine Short Arc Quad: AAROM;Right;10 reps;Supine Heel Slides: AAROM;Right;10 reps;Supine Hip ABduction/ADduction: AAROM;Right;10 reps;Supine End of Session PT - End of Session Equipment Utilized During Treatment: Gait belt Activity Tolerance: Patient tolerated treatment well Patient left: in bed;with call bell in reach;with family/visitor present Nurse Communication: Mobility status for transfers General Behavior During Session: [facility]: Lambert Rhonda W for tasks performed Cognition: [facility] for tasks performed  Konrad Penta 11/01/2011, 1:08 PM

## 2011-11-01 NOTE — Addendum Note (Signed)
Addendum  created 11/01/11 0736 by Minerva Areola, CRNA   Modules edited:Notes Section

## 2011-11-01 NOTE — Progress Notes (Signed)
Subjective: This man has done well overnight. He has had no hypotension or any complications respiratory wise. He does have a fracture of the right big toe. He is eating well. He is in pain postoperatively. Physical therapy has not seen him yet.           Physical Exam: Blood pressure 134/57, pulse 88, temperature 98.5 F (36.9 C), temperature source Oral, resp. rate 17, height 5\' 9"  (1.753 m), weight 70.3 kg (154 lb 15.7 oz), SpO2 96.00%. He looks systemically well. Heart sounds are present and in sinus rhythm. Lung fields are clear. He is alert and orientated.   Investigations:  Recent Results (from the past 240 hour(s))  MRSA PCR SCREENING     Status: Normal   Collection Time   10/29/11  6:56 AM      Component Value Range Status Comment   MRSA by PCR NEGATIVE  NEGATIVE  Final   SURGICAL PCR SCREEN     Status: Normal   Collection Time   10/31/11  6:20 AM      Component Value Range Status Comment   MRSA, PCR NEGATIVE  NEGATIVE  Final    Staphylococcus aureus NEGATIVE  NEGATIVE  Final      Basic Metabolic Panel:  Basename 11/01/11 0431 10/31/11 0401  NA 137 137  K 4.7 3.8  CL 102 106  CO2 25 26  GLUCOSE 278* 212*  BUN 17 22  CREATININE 1.07 1.05  CALCIUM 8.4 7.7*  MG -- --  PHOS -- --   Liver Function Tests:  [facility] 10/31/11 0401 10/30/11 0442  AST 16 18  ALT 10 12  ALKPHOS 88 76  BILITOT 0.4 0.4  PROT 4.8* 5.6*  ALBUMIN 2.4* 3.0*     CBC:  Basename 11/01/11 0431 10/31/11 1538 10/31/11 0401  WBC 8.4 -- 6.7  NEUTROABS -- -- --  HGB 11.5* 11.6* --  HCT 33.8* 33.6* --  MCV 88.9 -- 90.9  PLT 224 -- 208    Dg Hip Operative Right  10/31/2011  *RADIOLOGY REPORT*  Clinical Data: The right hip fracture.  OPERATIVE RIGHT HIP  Comparison: 10/29/2011  Findings: Multiple intraoperative spot images demonstrate internal fixation of the right intertrochanteric fracture with dynamic hip screw and intramedullary rod.  Near anatomic alignment.  No complicating  feature.  IMPRESSION: Internal fixation right intertrochanteric fracture.  Original Report Authenticated By: Cyndie Chime, M.D.   Dg Foot 2 Views Right  10/31/2011  *RADIOLOGY REPORT*  Clinical Data: Great toe pain.  RIGHT FOOT - 2 VIEW  Comparison: None.  Findings: There is a fracture through the base of the proximal first phalanx, seen only on the AP view.  Presumed intra-articular extension although this is difficult to visualize.  There is deformity of the right second metatarsal, likely old healed injury. Note that additional acute bony abnormality.  Diffuse osteopenia.  IMPRESSION: Fracture through the base of the right first proximal phalanx.  Original Report Authenticated By: Cyndie Chime, M.D.      Medications: I have reviewed the patient's current medications.  Impression: 1. Fracture right hip, status post surgery. 2. Diabetes, still not controlled. 3. Anemia.     Plan: 1. Increase insulin to control diabetes better. 2. Move to regular floor. 3. Physical therapy. 4. Disposition-she will need skilled nursing facility for rehabilitation.     LOS: 3 days   Kriston Mckinnie C 11/01/2011, 8:26 AM

## 2011-11-01 NOTE — Progress Notes (Signed)
Physical Therapy Evaluation Patient Details Name: Eduardo Lee MRN: [id number] DOB: [birth year] Today's Date: 11/01/2011  Problem List:  Patient Active Problem List  Diagnoses  . Hip fracture, right  . ARF (acute renal failure)  . Diabetes type 2, uncontrolled  . Leukocytosis  . Normocytic anemia  . Diabetic gastroparesis  . Weight loss, abnormal    Past Medical History:  Past Medical History  Diagnosis Date  . Gastroparesis   . Diabetes mellitus   . Weight loss   . Pulmonary embolism 12/2010  . DVT (deep venous thrombosis)   . Stroke    Past Surgical History:  Past Surgical History  Procedure Date  . Carpal tunnel release 08/2011    Left hand  . Ankle surgery     Patient states that he had pins place in the left foot  . Back surgery 2008  . Colonoscopy 01/2011  . Upper gastrointestinal endoscopy 01/2011    PT Assessment/Plan/Recommendation PT Assessment Clinical Impression Statement: very pleasant gentleman who appears to have some underlying balance deficit, now with OTIF R hip...he is cooperative and well motivated, tolerated initial exerciase and transfer well...he will need SNF at d/c PT Recommendation/Assessment: Patient will need skilled PT in the acute care venue PT Problem List: Decreased strength;Decreased range of motion;Decreased activity tolerance;Decreased mobility;Decreased balance;Decreased knowledge of use of DME;Decreased safety awareness;Decreased knowledge of precautions;Pain Barriers to Discharge: Decreased caregiver support PT Therapy Diagnosis : Difficulty walking;Acute pain PT Plan PT Frequency: Min 5X/week PT Treatment/Interventions: DME instruction;Gait training;Functional mobility training;Therapeutic exercise;Balance training;Patient/family education PT Recommendation Follow Up Recommendations: Skilled nursing facility Equipment Recommended: Defer to next venue PT Goals  Acute Rehab PT Goals PT Goal Formulation: With patient Pt will  go Supine/Side to Sit: with mod assist Pt will go Sit to Supine/Side: with mod assist Pt will Transfer Bed to Chair/Chair to Bed: with mod assist Pt will Ambulate: 1 - 15 feet  PT Evaluation Precautions/Restrictions  Precautions Precautions: Fall Required Braces or Orthoses: No Restrictions Weight Bearing Restrictions: No Other Position/Activity Restrictions: Pt is WBAT R per MD order Prior Functioning  Home Living Lives With: Friend(s) Receives Help From: Friend(s);Family Type of Home: House Home Layout: One level Home Access: Ramped entrance Home Adaptive Equipment: Bedside commode/3-in-1;Walker - rolling;Straight cane Prior Function Level of Independence: Independent with basic ADLs;Independent with homemaking with ambulation;Independent with gait;Independent with transfers Vocation: Retired Financial risk analyst Arousal/Alertness: Awake/alert Overall Cognitive Status: Appears within functional limits for tasks assessed Orientation Level: Oriented X4 Sensation/Coordination Sensation Light Touch: Appears Intact Stereognosis: Not tested Hot/Cold: Not tested Proprioception: Not tested Coordination Gross Motor Movements are Fluid and Coordinated: Yes Fine Motor Movements are Fluid and Coordinated: Not tested Extremity Assessment RUE Assessment RUE Assessment: Within Functional Limits LUE Assessment LUE Assessment: Within Functional Limits RLE Assessment RLE Assessment: Not tested (minimal hip flex or abd achieved with ex) LLE Assessment LLE Assessment: Within Functional Limits Mobility (including Balance) Bed Mobility Bed Mobility: Yes Supine to Sit: 2: Max assist Sitting - Scoot to [place] of Bed: 2: Max assist Transfers Transfers: Yes Sit to Stand: 3: Mod assist Stand to Sit: 3: Mod assist Stand Pivot Transfers: 3: Mod assist Stand Pivot Transfer Details (indicate cue type and reason): needs assist with gait sequence, proper walker  position Ambulation/Gait Ambulation/Gait: No Stairs: No Wheelchair Mobility Wheelchair Mobility: No  Balance Balance Assessed: Yes Static Sitting Balance Static Sitting - Level of Assistance: 7: Independent Dynamic Sitting Balance Dynamic Sitting - Level of Assistance: 6: Modified independent (Device/Increase time);Other (comment) (avoids sitting  on R hip) Static Standing Balance Static Standing - Level of Assistance: 3: Mod assist;Other (comment) (tends to fall backward) Exercise  General Exercises - Lower Extremity Ankle Circles/Pumps: AROM;Both;10 reps;Supine Quad Sets: AROM;Both;10 reps;Supine Gluteal Sets: AROM;Both;10 reps;Supine Short Arc Quad: AAROM;Right;10 reps;Supine Heel Slides: AAROM;Right;10 reps;Supine Hip ABduction/ADduction: AAROM;Right;10 reps;Supine End of Session PT - End of Session Equipment Utilized During Treatment: Gait belt Activity Tolerance: Patient tolerated treatment well Patient left: in chair;with call bell in reach;with family/visitor present Nurse Communication: Mobility status for transfers General Behavior During Session: [facility] for tasks performed Cognition: [facility] for tasks performed  Konrad Penta 11/01/2011, 11:06 AM

## 2011-11-01 NOTE — Anesthesia Postprocedure Evaluation (Signed)
Anesthesia Post Note  Patient: Eduardo Lee  Procedure(s) Performed:  OPEN REDUCTION INTERNAL FIXATION HIP - with Gamma Nail  Anesthesia type: Spinal  Patient location: ICU 8  Post pain: Pain level controlled  Post assessment: Post-op Vital signs reviewed, Patient's Cardiovascular Status Stable, Respiratory Function Stable, Patent Airway, No signs of Nausea or vomiting and Pain level controlled  Last Vitals:  Filed Vitals:   11/01/11 0600  BP: 134/57  Pulse: 88  Temp:   Resp: 17    Post vital signs: Reviewed and stable  Level of consciousness: awake and alert   Complications: No apparent anesthesia complications

## 2011-11-01 NOTE — Progress Notes (Signed)
Subjective: 1 Day Post-Op Procedure(s) (LRB): OPEN REDUCTION INTERNAL FIXATION HIP (Right) Patient reports pain as 0 on 0-10 scale.    Objective: Vital signs in last 24 hours: Temp:  [98 F (36.7 C)-98.6 F (37 C)] 98.1 F (36.7 C) (11/30 0800) Pulse Rate:  [82-103] 96  (11/30 1400) Resp:  [16-21] 18  (11/30 1400) BP: (114-144)/(44-106) 116/54 mmHg (11/30 1400) SpO2:  [93 %-99 %] 96 % (11/30 1400) Weight:  [70.3 kg (154 lb 15.7 oz)] 154 lb 15.7 oz (70.3 kg) (11/30 0500)  Intake/Output from previous day: 11/29 0701 - 11/30 0700 In: 5832.5 [P.O.:720; I.V.:3962.5; Blood:1050; IV Piggyback:100] Out: 2276 [Urine:2175; Stool:1; Blood:100] Intake/Output this shift: Total I/O In: 500 [I.V.:500] Out: 700 [Urine:700]   Basename 11/01/11 0431 10/31/11 1538 10/31/11 0401 10/30/11 0442  HGB 11.5* 11.6* 8.6* 10.3*    Basename 11/01/11 0431 10/31/11 1538 10/31/11 0401  WBC 8.4 -- 6.7  RBC 3.80* -- 2.87*  HCT 33.8* 33.6* --  PLT 224 -- 208    Basename 11/01/11 0431 10/31/11 0401  NA 137 137  K 4.7 3.8  CL 102 106  CO2 25 26  BUN 17 22  CREATININE 1.07 1.05  GLUCOSE 278* 212*  CALCIUM 8.4 7.7*   No results found for this basename: LABPT:2,INR:2 in the last 72 hours  Neurologically intact Neurovascular intact Sensation intact distally Intact pulses distally Dorsiflexion/Plantar flexion intact  Assessment/Plan: 1 Day Post-Op Procedure(s) (LRB): OPEN REDUCTION INTERNAL FIXATION HIP (Right) Up with therapy  Eduardo Lee 11/01/2011, 5:51 PM

## 2011-11-01 NOTE — Progress Notes (Signed)
CSW presented bed offers and pt chooses [facility].  Pt's son also agreeable.  Possible d/c Monday if stable.  Facility aware.  Karn Cassis

## 2011-11-02 LAB — CBC
HCT: 32.1 % — ABNORMAL LOW (ref 39.0–52.0)
Hemoglobin: 11.1 g/dL — ABNORMAL LOW (ref 13.0–17.0)
MCHC: 34.6 g/dL (ref 30.0–36.0)
MCV: 88.4 fL (ref 78.0–100.0)
RDW: 14.8 % (ref 11.5–15.5)

## 2011-11-02 LAB — GLUCOSE, CAPILLARY
Glucose-Capillary: 167 mg/dL — ABNORMAL HIGH (ref 70–99)
Glucose-Capillary: 122 mg/dL — ABNORMAL HIGH (ref 70–99)
Glucose-Capillary: 228 mg/dL — ABNORMAL HIGH (ref 70–99)

## 2011-11-02 LAB — TYPE AND SCREEN
ABO/RH(D): O POS
Unit division: 0

## 2011-11-02 LAB — BASIC METABOLIC PANEL
BUN: 20 mg/dL (ref 6–23)
Chloride: 102 mEq/L (ref 96–112)
Creatinine, Ser: 1.04 mg/dL (ref 0.50–1.35)
GFR calc Af Amer: 75 mL/min — ABNORMAL LOW (ref >90)
GFR calc non Af Amer: 64 mL/min — ABNORMAL LOW (ref >90)
Glucose, Bld: 206 mg/dL — ABNORMAL HIGH (ref 70–99)
Potassium: 3.7 mEq/L (ref 3.5–5.1)

## 2011-11-02 NOTE — Progress Notes (Signed)
Physical Therapy Treatment Patient Details Name: Eduardo Lee MRN: [id number] DOB: 01-08-1926 Today's Date: 11/02/2011 Time: 12:30 - 1:05 Therapeutic activities: 20 min Gait 15 PT Assessment/Plan  PT - Assessment/Plan Comments on Treatment Session: Began ambulation with decrease stance phase R LE and short step length, vc-ing for proper tech for sit <->.  Pt very cooperative, tolerated total tx well.   PT Goals     PT Treatment Precautions/Restrictions  Precautions Precautions: Fall Required Braces or Orthoses: No Restrictions Weight Bearing Restrictions: Yes Other Position/Activity Restrictions: Pt is WBAT R per MD order Mobility (including Balance) Bed Mobility Bed Mobility: Yes Supine to Sit: 6: Modified independent (Device/Increase time);3: Mod assist Supine to Sit Details (indicate cue type and reason): vc-ing for proper tech Sitting - Scoot to Edge of Bed: 3: Mod assist Transfers Transfers: Yes Sit to Stand: 6: Modified independent (Device/Increase time);4: Min assist Sit to Stand Details (indicate cue type and reason): posterior lean Stand to Sit: 4: Min assist Stand to Sit Details: vc-ing to push from chair not to reach for walker Stand Pivot Transfer Details (indicate cue type and reason): needs assist with gait sequence, proper walker position  Ambulation/Gait Ambulation/Gait: Yes Ambulation/Gait Assistance: 3: Mod assist Ambulation Distance (Feet): 4 Feet Assistive device: Rolling walker Gait Pattern: Decreased step length - right;Decreased step length - left;Decreased stance time - right Gait velocity: very slow    Exercise  General Exercises - Lower Extremity Ankle Circles/Pumps: AROM;Both;15 reps Quad Sets: AROM;Both;10 reps Gluteal Sets: AROM;Both;10 reps Short Arc Quad: AROM;Both;10 reps Heel Slides: AAROM;Right;AROM;Left;10 reps Hip ABduction/ADduction: AROM;Both;10 reps End of Session PT - End of Session Equipment Utilized During Treatment: Gait  belt Activity Tolerance: Patient tolerated treatment well Patient left: in chair;with call bell in reach;with family/visitor present General Behavior During Session: [facility] for tasks performed Cognition: [facility] for tasks performed  Juel Burrow 11/02/2011, 1:14 PM

## 2011-11-02 NOTE — Progress Notes (Signed)
Subjective: 2 Days Post-Op Procedure(s) (LRB): OPEN REDUCTION INTERNAL FIXATION HIP (Right) Patient reports pain as 3 on 0-10 scale.    Objective: Vital signs in last 24 hours: Temp:  [98.3 F (36.8 C)-98.7 F (37.1 C)] 98.7 F (37.1 C) (12/01 0453) Pulse Rate:  [92-102] 92  (12/01 0453) Resp:  [16-22] 20  (12/01 0453) BP: (115-153)/(47-72) 153/72 mmHg (12/01 0453) SpO2:  [93 %-100 %] 93 % (12/01 0453)  Intake/Output from previous day: 11/30 0701 - 12/01 0700 In: 740 [P.O.:240; I.V.:500] Out: 1200 [Urine:1200] Intake/Output this shift: Total I/O In: -  Out: 750 [Urine:750]   Basename 11/02/11 0436 11/01/11 0431 10/31/11 1538 10/31/11 0401  HGB 11.1* 11.5* 11.6* 8.6*    Basename 11/02/11 0436 11/01/11 0431  WBC 7.5 8.4  RBC 3.63* 3.80*  HCT 32.1* 33.8*  PLT 245 224    Basename 11/02/11 0436 11/01/11 0431  NA 138 137  K 3.7 4.7  CL 102 102  CO2 27 25  BUN 20 17  CREATININE 1.04 1.07  GLUCOSE 206* 278*  CALCIUM 8.5 8.4   No results found for this basename: LABPT:2,INR:2 in the last 72 hours  Neurologically intact Neurovascular intact Sensation intact distally Intact pulses distally  Assessment/Plan: 2 Days Post-Op Procedure(s) (LRB): OPEN REDUCTION INTERNAL FIXATION HIP (Right) Up with therapy D/C IV fluids  Fuller Canada 11/02/2011, 8:11 AM

## 2011-11-02 NOTE — Progress Notes (Signed)
Subjective: This man is doing extremely well and has been mobilizing well after his hip surgery. He has no chest pain, dyspnea, cough or palpitations..           Physical Exam: Blood pressure 153/72, pulse 92, temperature 98.7 F (37.1 C), temperature source Oral, resp. rate 20, height 5\' 9"  (1.753 m), weight 70.3 kg (154 lb 15.7 oz), SpO2 93.00%. He looks systemically well. Heart sounds are present and in sinus rhythm. Lung fields are clear. He is alert and orientated.   Investigations:  Recent Results (from the past 240 hour(s))  MRSA PCR SCREENING     Status: Normal   Collection Time   10/29/11  6:56 AM      Component Value Range Status Comment   MRSA by PCR NEGATIVE  NEGATIVE  Final   SURGICAL PCR SCREEN     Status: Normal   Collection Time   10/31/11  6:20 AM      Component Value Range Status Comment   MRSA, PCR NEGATIVE  NEGATIVE  Final    Staphylococcus aureus NEGATIVE  NEGATIVE  Final      Basic Metabolic Panel:  [facility] 11/02/11 0436 11/01/11 0431  NA 138 137  K 3.7 4.7  CL 102 102  CO2 27 25  GLUCOSE 206* 278*  BUN 20 17  CREATININE 1.04 1.07  CALCIUM 8.5 8.4  MG -- --  PHOS -- --   Liver Function Tests:  [facility] 10/31/11 0401  AST 16  ALT 10  ALKPHOS 88  BILITOT 0.4  PROT 4.8*  ALBUMIN 2.4*     CBC:  Basename 11/02/11 0436 11/01/11 0431  WBC 7.5 8.4  NEUTROABS -- --  HGB 11.1* 11.5*  HCT 32.1* 33.8*  MCV 88.4 88.9  PLT 245 224    Dg Hip Operative Right  10/31/2011  *RADIOLOGY REPORT*  Clinical Data: The right hip fracture.  OPERATIVE RIGHT HIP  Comparison: 10/29/2011  Findings: Multiple intraoperative spot images demonstrate internal fixation of the right intertrochanteric fracture with dynamic hip screw and intramedullary rod.  Near anatomic alignment.  No complicating feature.  IMPRESSION: Internal fixation right intertrochanteric fracture.  Original Report Authenticated By: Cyndie Chime, M.D.   Dg Foot 2 Views  Right  10/31/2011  *RADIOLOGY REPORT*  Clinical Data: Great toe pain.  RIGHT FOOT - 2 VIEW  Comparison: None.  Findings: There is a fracture through the base of the proximal first phalanx, seen only on the AP view.  Presumed intra-articular extension although this is difficult to visualize.  There is deformity of the right second metatarsal, likely old healed injury. Note that additional acute bony abnormality.  Diffuse osteopenia.  IMPRESSION: Fracture through the base of the right first proximal phalanx.  Original Report Authenticated By: Cyndie Chime, M.D.      Medications: I have reviewed the patient's current medications.  Impression: 1. Fracture right hip, status post surgery. 2. Diabetes, still not controlled. 3. Anemia. 4. Fracture through the base of the first right proximal phalanx. No surgical treatment indicated.     Plan: 1. Continue to mobilize. 2. Discontinue intravenous fluids. 3. Disposition-skilled nursing facility on Monday.     LOS: 4 days   Kristie Bracewell C 11/02/2011, 9:14 AM

## 2011-11-03 LAB — GLUCOSE, CAPILLARY
Glucose-Capillary: 362 mg/dL — ABNORMAL HIGH (ref 70–99)
Glucose-Capillary: 197 mg/dL — ABNORMAL HIGH (ref 70–99)

## 2011-11-03 LAB — COMPREHENSIVE METABOLIC PANEL
ALT: 8 U/L (ref 0–53)
AST: 15 U/L (ref 0–37)
Albumin: 2.2 g/dL — ABNORMAL LOW (ref 3.5–5.2)
CO2: 32 mEq/L (ref 19–32)
Chloride: 104 mEq/L (ref 96–112)
Creatinine, Ser: 1 mg/dL (ref 0.50–1.35)
GFR calc non Af Amer: 67 mL/min — ABNORMAL LOW (ref >90)
Potassium: 3.7 mEq/L (ref 3.5–5.1)
Sodium: 140 mEq/L (ref 135–145)
Total Bilirubin: 0.6 mg/dL (ref 0.3–1.2)

## 2011-11-03 LAB — CBC
Platelets: 240 10*3/uL (ref 150–400)
RBC: 3.68 MIL/uL — ABNORMAL LOW (ref 4.22–5.81)
RDW: 14.3 % (ref 11.5–15.5)
WBC: 6.8 10*3/uL (ref 4.0–10.5)

## 2011-11-03 MED ORDER — INSULIN GLARGINE 100 UNIT/ML ~~LOC~~ SOLN
30.0000 [IU] | Freq: Every day | SUBCUTANEOUS | Status: DC
  Administered 2011-11-03: 30 [IU] via SUBCUTANEOUS

## 2011-11-03 NOTE — Progress Notes (Signed)
Subjective: This man is doing extremely well and has been mobilizing well after his hip surgery. He has no chest pain, dyspnea, cough or palpitations..           Physical Exam: Blood pressure 148/73, pulse 85, temperature 98.5 F (36.9 C), temperature source Oral, resp. rate 16, height 5\' 9"  (1.753 m), weight 69.491 kg (153 lb 3.2 oz), SpO2 96.00%. He looks systemically well. Heart sounds are present and in sinus rhythm. Lung fields are clear. He is alert and orientated.   Investigations:  Recent Results (from the past 240 hour(s))  MRSA PCR SCREENING     Status: Normal   Collection Time   10/29/11  6:56 AM      Component Value Range Status Comment   MRSA by PCR NEGATIVE  NEGATIVE  Final   SURGICAL PCR SCREEN     Status: Normal   Collection Time   10/31/11  6:20 AM      Component Value Range Status Comment   MRSA, PCR NEGATIVE  NEGATIVE  Final    Staphylococcus aureus NEGATIVE  NEGATIVE  Final      Basic Metabolic Panel:  Basename 11/03/11 0451 11/02/11 0436  NA 140 138  K 3.7 3.7  CL 104 102  CO2 32 27  GLUCOSE 209* 206*  BUN 19 20  CREATININE 1.00 1.04  CALCIUM 8.6 8.5  MG -- --  PHOS -- --   Liver Function Tests:  [facility] 11/03/11 0451  AST 15  ALT 8  ALKPHOS 92  BILITOT 0.6  PROT 4.9*  ALBUMIN 2.2*     CBC:  Basename 11/03/11 0451 11/02/11 0436  WBC 6.8 7.5  NEUTROABS -- --  HGB 11.1* 11.1*  HCT 32.8* 32.1*  MCV 89.1 88.4  PLT 240 245        Medications: I have reviewed the patient's current medications.  Impression: 1. Fracture right hip, status post surgery. 2. Diabetes, still not controlled. 3. Anemia. 4. Fracture through the base of the first right proximal phalanx. No surgical treatment indicated.     Plan: 1. Continue to mobilize. 2. Increase Lantus to better control his diabetes. 3. Disposition-skilled nursing facility on Monday.     LOS: 5 days   Kayron Kalmar C 11/03/2011, 10:18 AM

## 2011-11-04 ENCOUNTER — Inpatient Hospital Stay
Admission: RE | Admit: 2011-11-04 | Discharge: 2011-12-02 | Disposition: A | Discharge status: Home / Self Care | Payer: BLUE CROSS/BLUE SHIELD | Source: Ambulatory Visit | Attending: Internal Medicine | Admitting: Internal Medicine

## 2011-11-04 DIAGNOSIS — O2230 Deep phlebothrombosis in pregnancy, unspecified trimester: Principal | ICD-10-CM

## 2011-11-04 DIAGNOSIS — I82409 Acute embolism and thrombosis of unspecified deep veins of unspecified lower extremity: Secondary | ICD-10-CM

## 2011-11-04 LAB — BASIC METABOLIC PANEL
CO2: 34 mEq/L — ABNORMAL HIGH (ref 19–32)
Calcium: 8.7 mg/dL (ref 8.4–10.5)
GFR calc non Af Amer: 75 mL/min — ABNORMAL LOW (ref >90)
Potassium: 3.1 mEq/L — ABNORMAL LOW (ref 3.5–5.1)
Sodium: 142 mEq/L (ref 135–145)

## 2011-11-04 LAB — CBC
Hemoglobin: 11.1 g/dL — ABNORMAL LOW (ref 13.0–17.0)
MCH: 30.2 pg (ref 26.0–34.0)
MCHC: 33.9 g/dL (ref 30.0–36.0)
Platelets: 259 10*3/uL (ref 150–400)
RBC: 3.67 MIL/uL — ABNORMAL LOW (ref 4.22–5.81)

## 2011-11-04 LAB — GLUCOSE, CAPILLARY
Glucose-Capillary: 53 mg/dL — ABNORMAL LOW (ref 70–99)
Glucose-Capillary: 81 mg/dL (ref 70–99)

## 2011-11-04 MED ORDER — POTASSIUM CHLORIDE CRYS ER 20 MEQ PO TBCR
40.0000 meq | EXTENDED_RELEASE_TABLET | Freq: Once | ORAL | Status: AC
  Administered 2011-11-04: 40 meq via ORAL
  Filled 2011-11-04: qty 2

## 2011-11-04 MED ORDER — DSS 100 MG PO CAPS: 100.0000 mg | ORAL_CAPSULE | Freq: Two times a day (BID) | ORAL | Status: AC

## 2011-11-04 MED ORDER — OXYCODONE HCL 5 MG PO TABS: 5.0000 mg | ORAL_TABLET | ORAL | Status: AC | PRN

## 2011-11-04 MED ORDER — ENOXAPARIN SODIUM 30 MG/0.3ML ~~LOC~~ SOLN: 30.0000 mg | Freq: Two times a day (BID) | SUBCUTANEOUS | Status: DC

## 2011-11-04 MED ORDER — METHOCARBAMOL 500 MG PO TABS: 500.0000 mg | ORAL_TABLET | Freq: Four times a day (QID) | ORAL | Status: AC | PRN

## 2011-11-04 NOTE — Progress Notes (Signed)
Pt PICC removed from right upper arm. 38 cm in length. Vaseline guaze dressing applied. No bleeding noted. Pt tolerated procedure well. Pt verbalized understanding of post picc instructions. 

## 2011-11-04 NOTE — Discharge Summary (Signed)
Physician Discharge Summary  Patient ID: Eduardo Lee MRN: [id number] DOB/AGE: 1926/08/30 77 y.o. Primary Care Physician:LUKING,W S, MD, MD Admit date: 10/29/2011 Discharge date: 11/04/2011    Discharge Diagnoses:  1. Right hip fracture, status post open reduction and internal fixation of the right hip by Dr. Fuller Canada, orthopedics. 2. Diabetes mellitus. 3. Anemia, status post 1 unit blood transfusion. Hemoglobin 11.1, stable. 4. Fracture through the base of the right first proximal phalanx. No surgery indicated. 5. Acute renal failure, resolved.  Current Discharge Medication List    START taking these medications   Details  docusate sodium 100 MG CAPS Take 100 mg by mouth 2 (two) times daily. Qty: 10 capsule, Refills: 0    enoxaparin (LOVENOX) 30 MG/0.3ML SOLN Inject 0.3 mLs (30 mg total) into the skin every 12 (twelve) hours. Qty: 8.4 mL, Refills: 0    methocarbamol (ROBAXIN) 500 MG tablet Take 1 tablet (500 mg total) by mouth every 6 (six) hours as needed. Qty: 30 tablet, Refills: 0    oxyCODONE (OXY IR/ROXICODONE) 5 MG immediate release tablet Take 1 tablet (5 mg total) by mouth every 4 (four) hours as needed. Qty: 30 tablet, Refills: 0      CONTINUE these medications which have NOT CHANGED   Details  aspirin 81 MG chewable tablet Chew 81 mg by mouth daily.      citalopram (CELEXA) 20 MG tablet Take 20 mg by mouth daily.      ezetimibe (ZETIA) 10 MG tablet Take 10 mg by mouth daily.      insulin aspart (NOVOLOG) 100 UNIT/ML injection Inject 6 Units into the skin 3 (three) times daily before meals.     insulin glargine (LANTUS) 100 UNIT/ML injection Inject 10 Units into the skin at bedtime.      latanoprost (XALATAN) 0.005 % ophthalmic solution Place 1 drop into both eyes daily.      vitamin B-12 (CYANOCOBALAMIN) 1000 MCG tablet Take 1,000 mcg by mouth daily.      Vitamin D, Ergocalciferol, (DRISDOL) 50000 UNITS CAPS Take 50,000 Units by mouth every 7  (seven) days.         Discharged Condition: Improved and stable.    Consults: Orthopedics, Dr. Fuller Canada.  Significant Diagnostic Studies: Dg Chest 1 View  10/29/2011  *RADIOLOGY REPORT*  Clinical Data: Status post fall; preoperative chest radiograph for right hip fracture.  CHEST - 1 VIEW  Comparison: Chest radiograph performed 12/18/2010 and CT of the chest performed 12/19/2010  Findings: The lungs are well-aerated and clear.  There is no evidence of focal opacification, pleural effusion or pneumothorax.  The cardiomediastinal silhouette is within normal limits.  No acute osseous abnormalities are seen.  Healed left-sided rib fractures are again noted.  IMPRESSION: No acute cardiopulmonary process seen.  Original Report Authenticated By: Tonia Ghent, M.D.   Dg Hip Complete Right  10/29/2011  *RADIOLOGY REPORT*  Clinical Data: Status post fall; right hip pain.  RIGHT HIP - COMPLETE 2+ VIEW  Comparison: None.  Findings: There is a comminuted intertrochanteric fracture through the proximal right femur, with medially displaced lesser trochanteric fragments.  The right femoral head remains seated at the acetabulum.  There is mild shortening at the fracture site.  The left hip is grossly unremarkable in appearance. The sacroiliac joints are within normal limits.  Mild degenerative change is noted along the lower lumbar spine.  The visualized bowel gas pattern is grossly unremarkable in appearance.  IMPRESSION: Comminuted intertrochanteric fracture through the proximal right femur,  with medially displaced lesser trochanteric fragments and mild shortening at the fracture site.  Original Report Authenticated By: Tonia Ghent, M.D.   Dg Hip Operative Right  10/31/2011  *RADIOLOGY REPORT*  Clinical Data: The right hip fracture.  OPERATIVE RIGHT HIP  Comparison: 10/29/2011  Findings: Multiple intraoperative spot images demonstrate internal fixation of the right intertrochanteric fracture with  dynamic hip screw and intramedullary rod.  Near anatomic alignment.  No complicating feature.  IMPRESSION: Internal fixation right intertrochanteric fracture.  Original Report Authenticated By: Cyndie Chime, M.D.   Ct Head Wo Contrast  10/29/2011  *RADIOLOGY REPORT*  Clinical Data:  Status post fall, with laceration above the right eye.  Concern for cervical spine injury.  CT HEAD WITHOUT CONTRAST AND CT CERVICAL SPINE WITHOUT CONTRAST  Technique:  Multidetector CT imaging of the head and cervical spine was performed following the standard protocol without intravenous contrast.  Multiplanar CT image reconstructions of the cervical spine were also generated.  Comparison: CT of the head performed 11/16/2006, and MRI of the brain performed 07/04/2010  CT HEAD  Findings: There is no evidence of acute infarction, mass lesion, or intra- or extra-axial hemorrhage on CT.  Prominence of the ventricles and sulci reflects moderate cortical volume loss.  Cerebellar atrophy is noted.  Mild periventricular and subcortical white matter change likely reflects small vessel ischemic microangiopathy.  Small chronic lacunar infarcts are seen within the basal ganglia and thalami bilaterally, and likely within the pons.  The cerebral hemispheres demonstrate grossly normal gray-white differentiation.  No mass effect or midline shift is seen.  There is no evidence of fracture; visualized osseous structures are unremarkable in appearance.  The orbits are within normal limits. Mucosal thickening is noted within the frontal sinuses; the remaining paranasal sinuses and mastoid air cells are well-aerated. The patient's known right supraorbital soft tissue laceration is not well characterized on CT.  IMPRESSION:  1.  No evidence of traumatic intracranial injury or fracture. 2.  Moderate cortical volume loss and scattered small vessel ischemic microangiopathy. 3.  Small chronic lacunar infarcts within the basal ganglia and thalami, and  likely within the pons. 4.  Mucosal thickening within the frontal sinuses.  CT CERVICAL SPINE  Findings: There is no evidence of acute fracture or subluxation. There is chronic grade 1 retrolisthesis of C3 on C4, and grade 1 anterolisthesis of C7 on T1; these appear to reflect facet disease. There is mild chronic anterior wedging involving vertebral body T1. There is multilevel disc space narrowing along the cervical and upper thoracic spine.  Associated small anterior and posterior disc osteophyte complexes are noted.  Prevertebral soft tissues are within normal limits.  The thyroid gland is unremarkable in appearance.  The visualized lung apices are clear.  Mild calcification is noted at the carotid bifurcations, more prominent on the right.  No significant soft tissue abnormalities are seen.  IMPRESSION:  1.  No evidence of acute fracture or subluxation along the cervical spine. 2.  Chronic grade 1 retrolisthesis of C3 on C4, and grade 1 anterolisthesis of C7-9 T1, reflecting facet disease.  Mild chronic anterior wedging involving vertebral body T1. 3.  Mild calcification at the carotid bifurcations, more prominent on the right.  Original Report Authenticated By: Tonia Ghent, M.D.   Ct Cervical Spine Wo Contrast  10/29/2011  *RADIOLOGY REPORT*  Clinical Data:  Status post fall, with laceration above the right eye.  Concern for cervical spine injury.  CT HEAD WITHOUT CONTRAST AND CT CERVICAL SPINE WITHOUT CONTRAST  Technique:  Multidetector CT imaging of the head and cervical spine was performed following the standard protocol without intravenous contrast.  Multiplanar CT image reconstructions of the cervical spine were also generated.  Comparison: CT of the head performed 11/16/2006, and MRI of the brain performed 07/04/2010  CT HEAD  Findings: There is no evidence of acute infarction, mass lesion, or intra- or extra-axial hemorrhage on CT.  Prominence of the ventricles and sulci reflects moderate cortical  volume loss.  Cerebellar atrophy is noted.  Mild periventricular and subcortical white matter change likely reflects small vessel ischemic microangiopathy.  Small chronic lacunar infarcts are seen within the basal ganglia and thalami bilaterally, and likely within the pons.  The cerebral hemispheres demonstrate grossly normal gray-white differentiation.  No mass effect or midline shift is seen.  There is no evidence of fracture; visualized osseous structures are unremarkable in appearance.  The orbits are within normal limits. Mucosal thickening is noted within the frontal sinuses; the remaining paranasal sinuses and mastoid air cells are well-aerated. The patient's known right supraorbital soft tissue laceration is not well characterized on CT.  IMPRESSION:  1.  No evidence of traumatic intracranial injury or fracture. 2.  Moderate cortical volume loss and scattered small vessel ischemic microangiopathy. 3.  Small chronic lacunar infarcts within the basal ganglia and thalami, and likely within the pons. 4.  Mucosal thickening within the frontal sinuses.  CT CERVICAL SPINE  Findings: There is no evidence of acute fracture or subluxation. There is chronic grade 1 retrolisthesis of C3 on C4, and grade 1 anterolisthesis of C7 on T1; these appear to reflect facet disease. There is mild chronic anterior wedging involving vertebral body T1. There is multilevel disc space narrowing along the cervical and upper thoracic spine.  Associated small anterior and posterior disc osteophyte complexes are noted.  Prevertebral soft tissues are within normal limits.  The thyroid gland is unremarkable in appearance.  The visualized lung apices are clear.  Mild calcification is noted at the carotid bifurcations, more prominent on the right.  No significant soft tissue abnormalities are seen.  IMPRESSION:  1.  No evidence of acute fracture or subluxation along the cervical spine. 2.  Chronic grade 1 retrolisthesis of C3 on C4, and grade  1 anterolisthesis of C7-9 T1, reflecting facet disease.  Mild chronic anterior wedging involving vertebral body T1. 3.  Mild calcification at the carotid bifurcations, more prominent on the right.  Original Report Authenticated By: Tonia Ghent, M.D.   Chest Portable 1 View Post Insertion To Confirm Placement As Interpreted By Radiologist  10/29/2011  *RADIOLOGY REPORT*  Clinical Data: Confirm PICC placement  PORTABLE CHEST - 1 VIEW  Comparison: Chest radiograph 10/29/2011 and chest CT 12/19/2010  Findings: A right upper extremity PICC terminates in the distal superior vena cava, in satisfactory position.  The lungs remain clear.  No evidence of pneumothorax or pleural effusion. Cardiomediastinal silhouette is stable.  At least one healed left- sided rib fracture is again noted.  IMPRESSION: Satisfactory position right upper extremity PICC.  Distal tip is in the distal superior vena cava.  No acute cardiopulmonary disease.  Original Report Authenticated By: Britta Mccreedy, M.D.   Dg Foot 2 Views Right  10/31/2011  *RADIOLOGY REPORT*  Clinical Data: Great toe pain.  RIGHT FOOT - 2 VIEW  Comparison: None.  Findings: There is a fracture through the base of the proximal first phalanx, seen only on the AP view.  Presumed intra-articular extension although this is difficult to visualize.  There is deformity of the right second metatarsal, likely old healed injury. Note that additional acute bony abnormality.  Diffuse osteopenia.  IMPRESSION: Fracture through the base of the right first proximal phalanx.  Original Report Authenticated By: Cyndie Chime, M.D.    Lab Results: Basic Metabolic Panel:  Basename 11/04/11 0430 11/03/11 0451  NA 142 140  K 3.1* 3.7  CL 103 104  CO2 34* 32  GLUCOSE 80 209*  BUN 18 19  CREATININE 0.96 1.00  CALCIUM 8.7 8.6  MG -- --  PHOS -- --   Liver Function Tests:  [facility] 11/03/11 0451  AST 15  ALT 8  ALKPHOS 92  BILITOT 0.6  PROT 4.9*  ALBUMIN 2.2*      CBC:  Basename 11/04/11 0430 11/03/11 0451  WBC 6.8 6.8  NEUTROABS -- --  HGB 11.1* 11.1*  HCT 32.7* 32.8*  MCV 89.1 89.1  PLT 259 240    Recent Results (from the past 240 hour(s))  MRSA PCR SCREENING     Status: Normal   Collection Time   10/29/11  6:56 AM      Component Value Range Status Comment   MRSA by PCR NEGATIVE  NEGATIVE  Final   SURGICAL PCR SCREEN     Status: Normal   Collection Time   10/31/11  6:20 AM      Component Value Range Status Comment   MRSA, PCR NEGATIVE  NEGATIVE  Final    Staphylococcus aureus NEGATIVE  NEGATIVE  Final      Hospital Course: This for a pleasant 77 year old man was admitted to the hospital with fractured right hip. Also, he was in acute renal failure with uncontrolled diabetes and a degree of acidosis. He was therefore initially admitted to the intensive care unit where his diabetes was corrected as well as his renal failure. He was seen by Dr. Fuller Canada, orthopedics, who then operated on him. He underwent surgery on 10/31/2011. Postoperatively  he has done very well and is mobilizing. He denies any chest pain, dyspnea or palpitations. His sugars have been somewhat fluctuant but appears now to be coming under control. He has been on prophylactic Lovenox twice a day and he will need to continue this for another month.  Discharge Exam: Blood pressure 133/69, pulse 84, temperature 98.3 F (36.8 C), temperature source Oral, resp. rate 18, height 5\' 9"  (1.753 m), weight 69.491 kg (153 lb 3.2 oz), SpO2 96.00%. He looks systemically well. Heart sounds are present and normal. Lung fields are clear. He is alert and orientated without any focal neurologic signs. There is some swelling in the right leg consistent with postoperative changes. This will need to be closely monitored as he has a previous history of DVT and PE.  Disposition: Skilled nursing facility. He will need followup with Dr. Fuller Canada in one month's time.  Discharge  Orders    Future Orders Please Complete By Expires   Diet - low sodium heart healthy      Increase activity slowly      Discharge instructions      Comments:   Continue Lovenox for one month.      Follow-up Information    Follow up with Fuller Canada, MD. Make an appointment in 1 month.   Contact information:   [address], [address] [place] [address] [phone]          Signed: Wilson Singer 11/04/2011, 11:09 AM

## 2011-11-04 NOTE — Progress Notes (Signed)
Pt transferred over to the [facility] with packet via w/c in stable condition.  Report was given on site to Harlene Ramus, LPN of [facility].  She verbalized understanding and voiced no further questions at that time.

## 2011-11-04 NOTE — Progress Notes (Signed)
Patient ID: Eduardo Lee, male   DOB: [birth year], 77 y.o.   MRN: [id number]  Orthopaedic Instructions for Gamma Nail   Right hip fracture   S/P gamma nail   Weight bearing as tolerated   Remove staples in 8 days on dec 11  F/U in 1 month for x-rays   Lovenox x 28 days

## 2011-11-04 NOTE — Progress Notes (Signed)
Pt d/c today by MD to [facility].  Pt, pt's family, and facility aware and agreeable.  Pt to transfer with RN.  D/C summary faxed to [facility].  Karn Cassis

## 2011-11-05 LAB — GLUCOSE, CAPILLARY
Glucose-Capillary: 238 mg/dL — ABNORMAL HIGH (ref 70–99)
Glucose-Capillary: 253 mg/dL — ABNORMAL HIGH (ref 70–99)
Glucose-Capillary: 255 mg/dL — ABNORMAL HIGH (ref 70–99)
Glucose-Capillary: 348 mg/dL — ABNORMAL HIGH (ref 70–99)

## 2011-11-05 LAB — PREPARE RBC (CROSSMATCH)

## 2011-11-06 ENCOUNTER — Telehealth (INDEPENDENT_AMBULATORY_CARE_PROVIDER_SITE_OTHER): Payer: Self-pay | Admitting: *Deleted

## 2011-11-06 ENCOUNTER — Encounter (HOSPITAL_COMMUNITY): Payer: Self-pay | Admitting: Orthopedic Surgery

## 2011-11-06 LAB — GLUCOSE, CAPILLARY
Glucose-Capillary: 303 mg/dL — ABNORMAL HIGH (ref 70–99)
Glucose-Capillary: 423 mg/dL — ABNORMAL HIGH (ref 70–99)
Glucose-Capillary: 276 mg/dL — ABNORMAL HIGH (ref 70–99)

## 2011-11-06 NOTE — Telephone Encounter (Signed)
Patient fell and broke his right hip and has been moved from [facility]. Dr. Karilyn Cota gave him a medication that is an experimental drug. He can not get this medication anymore and Dr. Karilyn Cota was going to check and see if he could get this for him. Please return the call to Kathlene November, son and [place], at [phone]. Patient is having trouble swallowing scence he is out of his medicine also.

## 2011-11-07 LAB — GLUCOSE, CAPILLARY
Glucose-Capillary: 211 mg/dL — ABNORMAL HIGH (ref 70–99)
Glucose-Capillary: 309 mg/dL — ABNORMAL HIGH (ref 70–99)

## 2011-11-09 LAB — GLUCOSE, CAPILLARY
Glucose-Capillary: 162 mg/dL — ABNORMAL HIGH (ref 70–99)
Glucose-Capillary: 138 mg/dL — ABNORMAL HIGH (ref 70–99)
Glucose-Capillary: 153 mg/dL — ABNORMAL HIGH (ref 70–99)

## 2011-11-10 LAB — GLUCOSE, CAPILLARY
Glucose-Capillary: 365 mg/dL — ABNORMAL HIGH (ref 70–99)
Glucose-Capillary: 226 mg/dL — ABNORMAL HIGH (ref 70–99)

## 2011-11-11 ENCOUNTER — Ambulatory Visit (HOSPITAL_COMMUNITY)
Admit: 2011-11-11 | Discharge: 2011-11-11 | Disposition: A | Discharge status: Skilled Nursing Facility | Payer: Medicare Other | Source: Skilled Nursing Facility | Attending: Internal Medicine | Admitting: Internal Medicine

## 2011-11-11 DIAGNOSIS — M7989 Other specified soft tissue disorders: Secondary | ICD-10-CM | POA: Insufficient documentation

## 2011-11-11 DIAGNOSIS — I824Y9 Acute embolism and thrombosis of unspecified deep veins of unspecified proximal lower extremity: Secondary | ICD-10-CM | POA: Insufficient documentation

## 2011-11-11 DIAGNOSIS — Z7901 Long term (current) use of anticoagulants: Secondary | ICD-10-CM | POA: Insufficient documentation

## 2011-11-11 DIAGNOSIS — M79609 Pain in unspecified limb: Secondary | ICD-10-CM | POA: Insufficient documentation

## 2011-11-11 LAB — GLUCOSE, CAPILLARY
Glucose-Capillary: 252 mg/dL — ABNORMAL HIGH (ref 70–99)
Glucose-Capillary: 322 mg/dL — ABNORMAL HIGH (ref 70–99)

## 2011-11-12 LAB — GLUCOSE, CAPILLARY
Glucose-Capillary: 217 mg/dL — ABNORMAL HIGH (ref 70–99)
Glucose-Capillary: 161 mg/dL — ABNORMAL HIGH (ref 70–99)
Glucose-Capillary: 82 mg/dL (ref 70–99)

## 2011-11-13 ENCOUNTER — Other Ambulatory Visit (HOSPITAL_COMMUNITY): Payer: Medicare Other

## 2011-11-13 LAB — GLUCOSE, CAPILLARY: Glucose-Capillary: 173 mg/dL — ABNORMAL HIGH (ref 70–99)

## 2011-11-13 NOTE — Telephone Encounter (Signed)
I have talked with Dr. Renae Fickle. He is planning to complete paper work to be approved to continue prescribing this medication. I will call Mr. Eduardo Lee son  On 11-14-11.

## 2011-11-14 LAB — GLUCOSE, CAPILLARY
Glucose-Capillary: 145 mg/dL — ABNORMAL HIGH (ref 70–99)
Glucose-Capillary: 247 mg/dL — ABNORMAL HIGH (ref 70–99)
Glucose-Capillary: 159 mg/dL — ABNORMAL HIGH (ref 70–99)
Glucose-Capillary: 101 mg/dL — ABNORMAL HIGH (ref 70–99)

## 2011-11-15 LAB — GLUCOSE, CAPILLARY
Glucose-Capillary: 214 mg/dL — ABNORMAL HIGH (ref 70–99)
Glucose-Capillary: 237 mg/dL — ABNORMAL HIGH (ref 70–99)

## 2011-11-16 LAB — GLUCOSE, CAPILLARY
Glucose-Capillary: 142 mg/dL — ABNORMAL HIGH (ref 70–99)
Glucose-Capillary: 371 mg/dL — ABNORMAL HIGH (ref 70–99)

## 2011-11-17 LAB — GLUCOSE, CAPILLARY: Glucose-Capillary: 254 mg/dL — ABNORMAL HIGH (ref 70–99)

## 2011-11-18 LAB — GLUCOSE, CAPILLARY
Glucose-Capillary: 145 mg/dL — ABNORMAL HIGH (ref 70–99)
Glucose-Capillary: 104 mg/dL — ABNORMAL HIGH (ref 70–99)

## 2011-11-19 LAB — GLUCOSE, CAPILLARY
Glucose-Capillary: 148 mg/dL — ABNORMAL HIGH (ref 70–99)
Glucose-Capillary: 96 mg/dL (ref 70–99)
Glucose-Capillary: 303 mg/dL — ABNORMAL HIGH (ref 70–99)

## 2011-11-20 LAB — GLUCOSE, CAPILLARY
Glucose-Capillary: 231 mg/dL — ABNORMAL HIGH (ref 70–99)
Glucose-Capillary: 186 mg/dL — ABNORMAL HIGH (ref 70–99)
Glucose-Capillary: 173 mg/dL — ABNORMAL HIGH (ref 70–99)

## 2011-11-20 NOTE — Telephone Encounter (Signed)
Dr. Karilyn Cota is made aware.

## 2011-11-21 LAB — GLUCOSE, CAPILLARY
Glucose-Capillary: 148 mg/dL — ABNORMAL HIGH (ref 70–99)
Glucose-Capillary: 333 mg/dL — ABNORMAL HIGH (ref 70–99)
Glucose-Capillary: 307 mg/dL — ABNORMAL HIGH (ref 70–99)

## 2011-11-22 LAB — GLUCOSE, CAPILLARY: Glucose-Capillary: 135 mg/dL — ABNORMAL HIGH (ref 70–99)

## 2011-11-23 LAB — GLUCOSE, CAPILLARY
Glucose-Capillary: 63 mg/dL — ABNORMAL LOW (ref 70–99)
Glucose-Capillary: 219 mg/dL — ABNORMAL HIGH (ref 70–99)
Glucose-Capillary: 121 mg/dL — ABNORMAL HIGH (ref 70–99)
Glucose-Capillary: 93 mg/dL (ref 70–99)

## 2011-11-24 LAB — GLUCOSE, CAPILLARY
Glucose-Capillary: 179 mg/dL — ABNORMAL HIGH (ref 70–99)
Glucose-Capillary: 217 mg/dL — ABNORMAL HIGH (ref 70–99)
Glucose-Capillary: 271 mg/dL — ABNORMAL HIGH (ref 70–99)
Glucose-Capillary: 391 mg/dL — ABNORMAL HIGH (ref 70–99)
Glucose-Capillary: 47 mg/dL — ABNORMAL LOW (ref 70–99)
Glucose-Capillary: 49 mg/dL — ABNORMAL LOW (ref 70–99)

## 2011-11-25 LAB — GLUCOSE, CAPILLARY
Glucose-Capillary: 154 mg/dL — ABNORMAL HIGH (ref 70–99)
Glucose-Capillary: 129 mg/dL — ABNORMAL HIGH (ref 70–99)
Glucose-Capillary: 121 mg/dL — ABNORMAL HIGH (ref 70–99)
Glucose-Capillary: 121 mg/dL — ABNORMAL HIGH (ref 70–99)

## 2011-11-27 LAB — GLUCOSE, CAPILLARY
Glucose-Capillary: 82 mg/dL (ref 70–99)
Glucose-Capillary: 66 mg/dL — ABNORMAL LOW (ref 70–99)
Glucose-Capillary: 129 mg/dL — ABNORMAL HIGH (ref 70–99)
Glucose-Capillary: 184 mg/dL — ABNORMAL HIGH (ref 70–99)
Glucose-Capillary: 212 mg/dL — ABNORMAL HIGH (ref 70–99)

## 2011-11-28 LAB — GLUCOSE, CAPILLARY
Glucose-Capillary: 183 mg/dL — ABNORMAL HIGH (ref 70–99)
Glucose-Capillary: 195 mg/dL — ABNORMAL HIGH (ref 70–99)
Glucose-Capillary: 180 mg/dL — ABNORMAL HIGH (ref 70–99)

## 2011-11-29 LAB — GLUCOSE, CAPILLARY
Glucose-Capillary: 245 mg/dL — ABNORMAL HIGH (ref 70–99)
Glucose-Capillary: 144 mg/dL — ABNORMAL HIGH (ref 70–99)
Glucose-Capillary: 219 mg/dL — ABNORMAL HIGH (ref 70–99)
Glucose-Capillary: 235 mg/dL — ABNORMAL HIGH (ref 70–99)
Glucose-Capillary: 188 mg/dL — ABNORMAL HIGH (ref 70–99)

## 2011-11-30 LAB — GLUCOSE, CAPILLARY: Glucose-Capillary: 300 mg/dL — ABNORMAL HIGH (ref 70–99)

## 2011-12-01 LAB — GLUCOSE, CAPILLARY: Glucose-Capillary: 212 mg/dL — ABNORMAL HIGH (ref 70–99)

## 2011-12-02 LAB — GLUCOSE, CAPILLARY
Glucose-Capillary: 425 mg/dL — ABNORMAL HIGH (ref 70–99)
Glucose-Capillary: 355 mg/dL — ABNORMAL HIGH (ref 70–99)
Glucose-Capillary: 164 mg/dL — ABNORMAL HIGH (ref 70–99)
Glucose-Capillary: 267 mg/dL — ABNORMAL HIGH (ref 70–99)

## 2011-12-04 DIAGNOSIS — IMO0001 Reserved for inherently not codable concepts without codable children: Secondary | ICD-10-CM | POA: Diagnosis not present

## 2011-12-04 DIAGNOSIS — IMO0002 Reserved for concepts with insufficient information to code with codable children: Secondary | ICD-10-CM | POA: Diagnosis not present

## 2011-12-04 DIAGNOSIS — S7290XD Unspecified fracture of unspecified femur, subsequent encounter for closed fracture with routine healing: Secondary | ICD-10-CM | POA: Diagnosis not present

## 2011-12-04 DIAGNOSIS — E119 Type 2 diabetes mellitus without complications: Secondary | ICD-10-CM | POA: Diagnosis not present

## 2011-12-04 DIAGNOSIS — L89609 Pressure ulcer of unspecified heel, unspecified stage: Secondary | ICD-10-CM | POA: Diagnosis not present

## 2011-12-04 DIAGNOSIS — L8992 Pressure ulcer of unspecified site, stage 2: Secondary | ICD-10-CM | POA: Diagnosis not present

## 2011-12-06 DIAGNOSIS — IMO0001 Reserved for inherently not codable concepts without codable children: Secondary | ICD-10-CM | POA: Diagnosis not present

## 2011-12-06 DIAGNOSIS — S7290XD Unspecified fracture of unspecified femur, subsequent encounter for closed fracture with routine healing: Secondary | ICD-10-CM | POA: Diagnosis not present

## 2011-12-06 DIAGNOSIS — L89609 Pressure ulcer of unspecified heel, unspecified stage: Secondary | ICD-10-CM | POA: Diagnosis not present

## 2011-12-06 DIAGNOSIS — L8992 Pressure ulcer of unspecified site, stage 2: Secondary | ICD-10-CM | POA: Diagnosis not present

## 2011-12-06 DIAGNOSIS — E119 Type 2 diabetes mellitus without complications: Secondary | ICD-10-CM | POA: Diagnosis not present

## 2011-12-06 DIAGNOSIS — IMO0002 Reserved for concepts with insufficient information to code with codable children: Secondary | ICD-10-CM | POA: Diagnosis not present

## 2011-12-09 ENCOUNTER — Ambulatory Visit (INDEPENDENT_AMBULATORY_CARE_PROVIDER_SITE_OTHER): Payer: Medicare Other | Admitting: Orthopedic Surgery

## 2011-12-09 ENCOUNTER — Encounter: Payer: Self-pay | Admitting: Orthopedic Surgery

## 2011-12-09 VITALS — BP 102/56 | Ht 69.0 in | Wt 129.0 lb

## 2011-12-09 DIAGNOSIS — G5600 Carpal tunnel syndrome, unspecified upper limb: Secondary | ICD-10-CM | POA: Diagnosis not present

## 2011-12-09 DIAGNOSIS — M25549 Pain in joints of unspecified hand: Secondary | ICD-10-CM | POA: Diagnosis not present

## 2011-12-09 DIAGNOSIS — S72143A Displaced intertrochanteric fracture of unspecified femur, initial encounter for closed fracture: Secondary | ICD-10-CM

## 2011-12-09 DIAGNOSIS — S72141A Displaced intertrochanteric fracture of right femur, initial encounter for closed fracture: Secondary | ICD-10-CM | POA: Insufficient documentation

## 2011-12-09 NOTE — Patient Instructions (Signed)
WALK AS TOLERATED

## 2011-12-09 NOTE — Progress Notes (Signed)
Patient ID: Eduardo Lee, male   DOB: 12-22-1925, 78 y.o.   MRN: [id number]   Postoperative visit  Surgery date October 31, 2011  Procedure internal fixation RIGHT hip  Implants Gamma nail, short  Schedule for x-rays today  Complains of soreness in his knee  Normal hip flexion.  Restoration of leg lengths.  Patient has valgus deformity of his RIGHT knee.  X-ray show the fracture in good position the implant in good position  Healing as appropriate  Return first of March, patient will be in [place] until then, for x-ray  Separately identifiable x-ray report RIGHT hip 2 views reason for x-ray fracture RIGHT hip  A short Gamma nail is seen in the RIGHT hip.  There is an intertrochanteric fracture stabilized by the implant.  The fracture alignment is as expected in the healing is as expected  No complications were noted from the hardware  Impression healing RIGHT hip intertrochanteric fracture with short gamma nail

## 2011-12-10 DIAGNOSIS — L8992 Pressure ulcer of unspecified site, stage 2: Secondary | ICD-10-CM | POA: Diagnosis not present

## 2011-12-10 DIAGNOSIS — IMO0001 Reserved for inherently not codable concepts without codable children: Secondary | ICD-10-CM | POA: Diagnosis not present

## 2011-12-10 DIAGNOSIS — L89609 Pressure ulcer of unspecified heel, unspecified stage: Secondary | ICD-10-CM | POA: Diagnosis not present

## 2011-12-10 DIAGNOSIS — S7290XD Unspecified fracture of unspecified femur, subsequent encounter for closed fracture with routine healing: Secondary | ICD-10-CM | POA: Diagnosis not present

## 2011-12-10 DIAGNOSIS — E119 Type 2 diabetes mellitus without complications: Secondary | ICD-10-CM | POA: Diagnosis not present

## 2011-12-10 DIAGNOSIS — I82409 Acute embolism and thrombosis of unspecified deep veins of unspecified lower extremity: Secondary | ICD-10-CM | POA: Diagnosis not present

## 2011-12-10 DIAGNOSIS — L97409 Non-pressure chronic ulcer of unspecified heel and midfoot with unspecified severity: Secondary | ICD-10-CM | POA: Diagnosis not present

## 2011-12-10 DIAGNOSIS — IMO0002 Reserved for concepts with insufficient information to code with codable children: Secondary | ICD-10-CM | POA: Diagnosis not present

## 2011-12-10 DIAGNOSIS — D689 Coagulation defect, unspecified: Secondary | ICD-10-CM | POA: Diagnosis not present

## 2011-12-11 DIAGNOSIS — E119 Type 2 diabetes mellitus without complications: Secondary | ICD-10-CM | POA: Diagnosis not present

## 2011-12-11 DIAGNOSIS — L89609 Pressure ulcer of unspecified heel, unspecified stage: Secondary | ICD-10-CM | POA: Diagnosis not present

## 2011-12-11 DIAGNOSIS — IMO0001 Reserved for inherently not codable concepts without codable children: Secondary | ICD-10-CM | POA: Diagnosis not present

## 2011-12-11 DIAGNOSIS — IMO0002 Reserved for concepts with insufficient information to code with codable children: Secondary | ICD-10-CM | POA: Diagnosis not present

## 2011-12-11 DIAGNOSIS — S7290XD Unspecified fracture of unspecified femur, subsequent encounter for closed fracture with routine healing: Secondary | ICD-10-CM | POA: Diagnosis not present

## 2011-12-11 DIAGNOSIS — L8992 Pressure ulcer of unspecified site, stage 2: Secondary | ICD-10-CM | POA: Diagnosis not present

## 2011-12-12 DIAGNOSIS — E119 Type 2 diabetes mellitus without complications: Secondary | ICD-10-CM | POA: Diagnosis not present

## 2011-12-12 DIAGNOSIS — IMO0002 Reserved for concepts with insufficient information to code with codable children: Secondary | ICD-10-CM | POA: Diagnosis not present

## 2011-12-12 DIAGNOSIS — L89609 Pressure ulcer of unspecified heel, unspecified stage: Secondary | ICD-10-CM | POA: Diagnosis not present

## 2011-12-12 DIAGNOSIS — S7290XD Unspecified fracture of unspecified femur, subsequent encounter for closed fracture with routine healing: Secondary | ICD-10-CM | POA: Diagnosis not present

## 2011-12-12 DIAGNOSIS — L8992 Pressure ulcer of unspecified site, stage 2: Secondary | ICD-10-CM | POA: Diagnosis not present

## 2011-12-12 DIAGNOSIS — IMO0001 Reserved for inherently not codable concepts without codable children: Secondary | ICD-10-CM | POA: Diagnosis not present

## 2011-12-13 DIAGNOSIS — IMO0001 Reserved for inherently not codable concepts without codable children: Secondary | ICD-10-CM | POA: Diagnosis not present

## 2011-12-13 DIAGNOSIS — L8992 Pressure ulcer of unspecified site, stage 2: Secondary | ICD-10-CM | POA: Diagnosis not present

## 2011-12-13 DIAGNOSIS — S7290XD Unspecified fracture of unspecified femur, subsequent encounter for closed fracture with routine healing: Secondary | ICD-10-CM | POA: Diagnosis not present

## 2011-12-13 DIAGNOSIS — L89609 Pressure ulcer of unspecified heel, unspecified stage: Secondary | ICD-10-CM | POA: Diagnosis not present

## 2011-12-13 DIAGNOSIS — IMO0002 Reserved for concepts with insufficient information to code with codable children: Secondary | ICD-10-CM | POA: Diagnosis not present

## 2011-12-13 DIAGNOSIS — E119 Type 2 diabetes mellitus without complications: Secondary | ICD-10-CM | POA: Diagnosis not present

## 2011-12-16 DIAGNOSIS — IMO0002 Reserved for concepts with insufficient information to code with codable children: Secondary | ICD-10-CM | POA: Diagnosis not present

## 2011-12-16 DIAGNOSIS — L8992 Pressure ulcer of unspecified site, stage 2: Secondary | ICD-10-CM | POA: Diagnosis not present

## 2011-12-16 DIAGNOSIS — S7290XD Unspecified fracture of unspecified femur, subsequent encounter for closed fracture with routine healing: Secondary | ICD-10-CM | POA: Diagnosis not present

## 2011-12-16 DIAGNOSIS — IMO0001 Reserved for inherently not codable concepts without codable children: Secondary | ICD-10-CM | POA: Diagnosis not present

## 2011-12-16 DIAGNOSIS — E119 Type 2 diabetes mellitus without complications: Secondary | ICD-10-CM | POA: Diagnosis not present

## 2011-12-16 DIAGNOSIS — L89609 Pressure ulcer of unspecified heel, unspecified stage: Secondary | ICD-10-CM | POA: Diagnosis not present

## 2011-12-17 DIAGNOSIS — L89609 Pressure ulcer of unspecified heel, unspecified stage: Secondary | ICD-10-CM | POA: Diagnosis not present

## 2011-12-17 DIAGNOSIS — IMO0002 Reserved for concepts with insufficient information to code with codable children: Secondary | ICD-10-CM | POA: Diagnosis not present

## 2011-12-17 DIAGNOSIS — E119 Type 2 diabetes mellitus without complications: Secondary | ICD-10-CM | POA: Diagnosis not present

## 2011-12-17 DIAGNOSIS — L8992 Pressure ulcer of unspecified site, stage 2: Secondary | ICD-10-CM | POA: Diagnosis not present

## 2011-12-17 DIAGNOSIS — S7290XD Unspecified fracture of unspecified femur, subsequent encounter for closed fracture with routine healing: Secondary | ICD-10-CM | POA: Diagnosis not present

## 2011-12-17 DIAGNOSIS — IMO0001 Reserved for inherently not codable concepts without codable children: Secondary | ICD-10-CM | POA: Diagnosis not present

## 2011-12-18 DIAGNOSIS — L89609 Pressure ulcer of unspecified heel, unspecified stage: Secondary | ICD-10-CM | POA: Diagnosis not present

## 2011-12-18 DIAGNOSIS — IMO0002 Reserved for concepts with insufficient information to code with codable children: Secondary | ICD-10-CM | POA: Diagnosis not present

## 2011-12-18 DIAGNOSIS — L8992 Pressure ulcer of unspecified site, stage 2: Secondary | ICD-10-CM | POA: Diagnosis not present

## 2011-12-18 DIAGNOSIS — S7290XD Unspecified fracture of unspecified femur, subsequent encounter for closed fracture with routine healing: Secondary | ICD-10-CM | POA: Diagnosis not present

## 2011-12-18 DIAGNOSIS — IMO0001 Reserved for inherently not codable concepts without codable children: Secondary | ICD-10-CM | POA: Diagnosis not present

## 2011-12-18 DIAGNOSIS — E119 Type 2 diabetes mellitus without complications: Secondary | ICD-10-CM | POA: Diagnosis not present

## 2011-12-19 DIAGNOSIS — E119 Type 2 diabetes mellitus without complications: Secondary | ICD-10-CM | POA: Diagnosis not present

## 2011-12-19 DIAGNOSIS — IMO0002 Reserved for concepts with insufficient information to code with codable children: Secondary | ICD-10-CM | POA: Diagnosis not present

## 2011-12-19 DIAGNOSIS — L89609 Pressure ulcer of unspecified heel, unspecified stage: Secondary | ICD-10-CM | POA: Diagnosis not present

## 2011-12-19 DIAGNOSIS — IMO0001 Reserved for inherently not codable concepts without codable children: Secondary | ICD-10-CM | POA: Diagnosis not present

## 2011-12-19 DIAGNOSIS — L8992 Pressure ulcer of unspecified site, stage 2: Secondary | ICD-10-CM | POA: Diagnosis not present

## 2011-12-19 DIAGNOSIS — S7290XD Unspecified fracture of unspecified femur, subsequent encounter for closed fracture with routine healing: Secondary | ICD-10-CM | POA: Diagnosis not present

## 2011-12-23 DIAGNOSIS — S7290XD Unspecified fracture of unspecified femur, subsequent encounter for closed fracture with routine healing: Secondary | ICD-10-CM | POA: Diagnosis not present

## 2011-12-23 DIAGNOSIS — IMO0002 Reserved for concepts with insufficient information to code with codable children: Secondary | ICD-10-CM | POA: Diagnosis not present

## 2011-12-23 DIAGNOSIS — IMO0001 Reserved for inherently not codable concepts without codable children: Secondary | ICD-10-CM | POA: Diagnosis not present

## 2011-12-23 DIAGNOSIS — L89609 Pressure ulcer of unspecified heel, unspecified stage: Secondary | ICD-10-CM | POA: Diagnosis not present

## 2011-12-23 DIAGNOSIS — E119 Type 2 diabetes mellitus without complications: Secondary | ICD-10-CM | POA: Diagnosis not present

## 2011-12-23 DIAGNOSIS — L8992 Pressure ulcer of unspecified site, stage 2: Secondary | ICD-10-CM | POA: Diagnosis not present

## 2011-12-24 DIAGNOSIS — IMO0001 Reserved for inherently not codable concepts without codable children: Secondary | ICD-10-CM | POA: Diagnosis not present

## 2011-12-24 DIAGNOSIS — IMO0002 Reserved for concepts with insufficient information to code with codable children: Secondary | ICD-10-CM | POA: Diagnosis not present

## 2011-12-24 DIAGNOSIS — L97409 Non-pressure chronic ulcer of unspecified heel and midfoot with unspecified severity: Secondary | ICD-10-CM | POA: Diagnosis not present

## 2011-12-24 DIAGNOSIS — S7290XD Unspecified fracture of unspecified femur, subsequent encounter for closed fracture with routine healing: Secondary | ICD-10-CM | POA: Diagnosis not present

## 2011-12-24 DIAGNOSIS — G589 Mononeuropathy, unspecified: Secondary | ICD-10-CM | POA: Diagnosis not present

## 2011-12-24 DIAGNOSIS — L8992 Pressure ulcer of unspecified site, stage 2: Secondary | ICD-10-CM | POA: Diagnosis not present

## 2011-12-24 DIAGNOSIS — E119 Type 2 diabetes mellitus without complications: Secondary | ICD-10-CM | POA: Diagnosis not present

## 2011-12-24 DIAGNOSIS — L89609 Pressure ulcer of unspecified heel, unspecified stage: Secondary | ICD-10-CM | POA: Diagnosis not present

## 2011-12-26 DIAGNOSIS — IMO0002 Reserved for concepts with insufficient information to code with codable children: Secondary | ICD-10-CM | POA: Diagnosis not present

## 2011-12-26 DIAGNOSIS — E119 Type 2 diabetes mellitus without complications: Secondary | ICD-10-CM | POA: Diagnosis not present

## 2011-12-26 DIAGNOSIS — IMO0001 Reserved for inherently not codable concepts without codable children: Secondary | ICD-10-CM | POA: Diagnosis not present

## 2011-12-26 DIAGNOSIS — L8992 Pressure ulcer of unspecified site, stage 2: Secondary | ICD-10-CM | POA: Diagnosis not present

## 2011-12-26 DIAGNOSIS — S7290XD Unspecified fracture of unspecified femur, subsequent encounter for closed fracture with routine healing: Secondary | ICD-10-CM | POA: Diagnosis not present

## 2011-12-26 DIAGNOSIS — L89609 Pressure ulcer of unspecified heel, unspecified stage: Secondary | ICD-10-CM | POA: Diagnosis not present

## 2011-12-27 DIAGNOSIS — L89609 Pressure ulcer of unspecified heel, unspecified stage: Secondary | ICD-10-CM | POA: Diagnosis not present

## 2011-12-27 DIAGNOSIS — IMO0002 Reserved for concepts with insufficient information to code with codable children: Secondary | ICD-10-CM | POA: Diagnosis not present

## 2011-12-27 DIAGNOSIS — L8992 Pressure ulcer of unspecified site, stage 2: Secondary | ICD-10-CM | POA: Diagnosis not present

## 2011-12-27 DIAGNOSIS — E119 Type 2 diabetes mellitus without complications: Secondary | ICD-10-CM | POA: Diagnosis not present

## 2011-12-27 DIAGNOSIS — S7290XD Unspecified fracture of unspecified femur, subsequent encounter for closed fracture with routine healing: Secondary | ICD-10-CM | POA: Diagnosis not present

## 2011-12-27 DIAGNOSIS — IMO0001 Reserved for inherently not codable concepts without codable children: Secondary | ICD-10-CM | POA: Diagnosis not present

## 2012-01-07 DIAGNOSIS — Z7901 Long term (current) use of anticoagulants: Secondary | ICD-10-CM | POA: Diagnosis not present

## 2012-01-21 DIAGNOSIS — Z7901 Long term (current) use of anticoagulants: Secondary | ICD-10-CM | POA: Diagnosis not present

## 2012-02-03 DIAGNOSIS — G5600 Carpal tunnel syndrome, unspecified upper limb: Secondary | ICD-10-CM | POA: Diagnosis not present

## 2012-02-04 ENCOUNTER — Ambulatory Visit (INDEPENDENT_AMBULATORY_CARE_PROVIDER_SITE_OTHER): Payer: Medicare Other | Admitting: Orthopedic Surgery

## 2012-02-04 ENCOUNTER — Encounter: Payer: Self-pay | Admitting: Orthopedic Surgery

## 2012-02-04 VITALS — Ht 69.0 in | Wt 129.0 lb

## 2012-02-04 DIAGNOSIS — D68318 Other hemorrhagic disorder due to intrinsic circulating anticoagulants, antibodies, or inhibitors: Secondary | ICD-10-CM | POA: Diagnosis not present

## 2012-02-04 DIAGNOSIS — I82409 Acute embolism and thrombosis of unspecified deep veins of unspecified lower extremity: Secondary | ICD-10-CM | POA: Diagnosis not present

## 2012-02-04 DIAGNOSIS — G589 Mononeuropathy, unspecified: Secondary | ICD-10-CM | POA: Diagnosis not present

## 2012-02-04 DIAGNOSIS — L97409 Non-pressure chronic ulcer of unspecified heel and midfoot with unspecified severity: Secondary | ICD-10-CM | POA: Diagnosis not present

## 2012-02-04 DIAGNOSIS — S72141A Displaced intertrochanteric fracture of right femur, initial encounter for closed fracture: Secondary | ICD-10-CM

## 2012-02-04 DIAGNOSIS — S72143A Displaced intertrochanteric fracture of unspecified femur, initial encounter for closed fracture: Secondary | ICD-10-CM

## 2012-02-04 NOTE — Progress Notes (Signed)
Patient ID: Eduardo Lee, male   DOB: [birth year], 78 y.o.   MRN: [id number] Chief Complaint  Patient presents with  . Follow-up    Recheck on right hip.   10/29/2011 - 10/31/2011  10:14 AM  PATIENT: Eduardo Lee 78 y.o. male  PRE-OPERATIVE DIAGNOSIS: IntertrochantericFracture Right Hip  POST-OPERATIVE DIAGNOSIS: same  PROCEDURE: Procedure(s):  OPEN REDUCTION INTERNAL FIXATION RIGHT HIP / GAMMA NAIL  125 SHORT, 90 LAG AND 42 DISTAL LOCKING  PROXIMAL ACORN/SLIDING MODE  FINDINGS: 3 PART INTERTROCH WITH SMALL LESSER TROCH POSTERIOR MEDIAL BUTRESS INTACT   Three-month followup status post open treatment internal fixation right hip with gamma nail  Patient went on a trip had no issues at that time presents back for his three-month x-ray. He is using a rolling walker he has no hip pain at this time.  He does complain of some shortening of his right lower extremity this is exacerbated by valgus deformity of his left lower extremity and impaction of his fracture after the nail slid into a stable configuration  X-rays show fracture healing patient is advised to perform activities as tolerated and follow up as needed

## 2012-02-04 NOTE — Progress Notes (Signed)
X-ray report  AP lateral right hip  Right hip fracture  Three-part fracture right peritrochanteric region has healed in stable configuration. The hardware is in good position  There is some heterotopic bone in the greater trochanteric region  Impression healed three-part intertrochanteric fracture with mild heterotopic bone

## 2012-02-04 NOTE — Patient Instructions (Signed)
Activity as tolerated

## 2012-02-05 DIAGNOSIS — E785 Hyperlipidemia, unspecified: Secondary | ICD-10-CM | POA: Diagnosis not present

## 2012-02-05 DIAGNOSIS — E1065 Type 1 diabetes mellitus with hyperglycemia: Secondary | ICD-10-CM | POA: Diagnosis not present

## 2012-02-05 DIAGNOSIS — N182 Chronic kidney disease, stage 2 (mild): Secondary | ICD-10-CM | POA: Diagnosis not present

## 2012-02-05 DIAGNOSIS — E559 Vitamin D deficiency, unspecified: Secondary | ICD-10-CM | POA: Diagnosis not present

## 2012-02-07 DIAGNOSIS — E1065 Type 1 diabetes mellitus with hyperglycemia: Secondary | ICD-10-CM | POA: Diagnosis not present

## 2012-02-07 DIAGNOSIS — I129 Hypertensive chronic kidney disease with stage 1 through stage 4 chronic kidney disease, or unspecified chronic kidney disease: Secondary | ICD-10-CM | POA: Diagnosis not present

## 2012-02-07 DIAGNOSIS — E1142 Type 2 diabetes mellitus with diabetic polyneuropathy: Secondary | ICD-10-CM | POA: Diagnosis not present

## 2012-02-07 DIAGNOSIS — M810 Age-related osteoporosis without current pathological fracture: Secondary | ICD-10-CM | POA: Diagnosis not present

## 2012-02-07 DIAGNOSIS — E785 Hyperlipidemia, unspecified: Secondary | ICD-10-CM | POA: Diagnosis not present

## 2012-02-07 DIAGNOSIS — N182 Chronic kidney disease, stage 2 (mild): Secondary | ICD-10-CM | POA: Diagnosis not present

## 2012-02-07 DIAGNOSIS — E559 Vitamin D deficiency, unspecified: Secondary | ICD-10-CM | POA: Diagnosis not present

## 2012-02-07 DIAGNOSIS — E1049 Type 1 diabetes mellitus with other diabetic neurological complication: Secondary | ICD-10-CM | POA: Diagnosis not present

## 2012-02-23 DIAGNOSIS — J189 Pneumonia, unspecified organism: Secondary | ICD-10-CM | POA: Diagnosis not present

## 2012-03-02 DIAGNOSIS — I824Z9 Acute embolism and thrombosis of unspecified deep veins of unspecified distal lower extremity: Secondary | ICD-10-CM | POA: Diagnosis not present

## 2012-03-02 DIAGNOSIS — J069 Acute upper respiratory infection, unspecified: Secondary | ICD-10-CM | POA: Diagnosis not present

## 2012-03-02 DIAGNOSIS — D68318 Other hemorrhagic disorder due to intrinsic circulating anticoagulants, antibodies, or inhibitors: Secondary | ICD-10-CM | POA: Diagnosis not present

## 2012-03-02 DIAGNOSIS — G5790 Unspecified mononeuropathy of unspecified lower limb: Secondary | ICD-10-CM | POA: Diagnosis not present

## 2012-03-30 DIAGNOSIS — Z7901 Long term (current) use of anticoagulants: Secondary | ICD-10-CM | POA: Diagnosis not present

## 2012-04-13 ENCOUNTER — Encounter (INDEPENDENT_AMBULATORY_CARE_PROVIDER_SITE_OTHER): Payer: Self-pay | Admitting: *Deleted

## 2012-04-22 DIAGNOSIS — I82409 Acute embolism and thrombosis of unspecified deep veins of unspecified lower extremity: Secondary | ICD-10-CM | POA: Diagnosis not present

## 2012-04-22 DIAGNOSIS — T81718A Complication of other artery following a procedure, not elsewhere classified, initial encounter: Secondary | ICD-10-CM | POA: Diagnosis not present

## 2012-04-22 DIAGNOSIS — I2699 Other pulmonary embolism without acute cor pulmonale: Secondary | ICD-10-CM | POA: Diagnosis not present

## 2012-04-23 DIAGNOSIS — H35379 Puckering of macula, unspecified eye: Secondary | ICD-10-CM | POA: Diagnosis not present

## 2012-04-23 DIAGNOSIS — E1139 Type 2 diabetes mellitus with other diabetic ophthalmic complication: Secondary | ICD-10-CM | POA: Diagnosis not present

## 2012-04-23 DIAGNOSIS — E1165 Type 2 diabetes mellitus with hyperglycemia: Secondary | ICD-10-CM | POA: Diagnosis not present

## 2012-04-23 DIAGNOSIS — H353190 Nonexudative age-related macular degeneration, unspecified eye, stage unspecified: Secondary | ICD-10-CM | POA: Diagnosis not present

## 2012-04-23 DIAGNOSIS — H4011X0 Primary open-angle glaucoma, stage unspecified: Secondary | ICD-10-CM | POA: Diagnosis not present

## 2012-04-23 DIAGNOSIS — E11359 Type 2 diabetes mellitus with proliferative diabetic retinopathy without macular edema: Secondary | ICD-10-CM | POA: Diagnosis not present

## 2012-05-20 DIAGNOSIS — N182 Chronic kidney disease, stage 2 (mild): Secondary | ICD-10-CM | POA: Diagnosis not present

## 2012-05-20 DIAGNOSIS — E1142 Type 2 diabetes mellitus with diabetic polyneuropathy: Secondary | ICD-10-CM | POA: Diagnosis not present

## 2012-05-20 DIAGNOSIS — E1049 Type 1 diabetes mellitus with other diabetic neurological complication: Secondary | ICD-10-CM | POA: Diagnosis not present

## 2012-05-20 DIAGNOSIS — E785 Hyperlipidemia, unspecified: Secondary | ICD-10-CM | POA: Diagnosis not present

## 2012-05-20 DIAGNOSIS — E559 Vitamin D deficiency, unspecified: Secondary | ICD-10-CM | POA: Diagnosis not present

## 2012-05-22 DIAGNOSIS — E1065 Type 1 diabetes mellitus with hyperglycemia: Secondary | ICD-10-CM | POA: Diagnosis not present

## 2012-05-22 DIAGNOSIS — E785 Hyperlipidemia, unspecified: Secondary | ICD-10-CM | POA: Diagnosis not present

## 2012-05-22 DIAGNOSIS — E11359 Type 2 diabetes mellitus with proliferative diabetic retinopathy without macular edema: Secondary | ICD-10-CM | POA: Diagnosis not present

## 2012-05-22 DIAGNOSIS — E1142 Type 2 diabetes mellitus with diabetic polyneuropathy: Secondary | ICD-10-CM | POA: Diagnosis not present

## 2012-05-22 DIAGNOSIS — E1039 Type 1 diabetes mellitus with other diabetic ophthalmic complication: Secondary | ICD-10-CM | POA: Diagnosis not present

## 2012-05-22 DIAGNOSIS — E1029 Type 1 diabetes mellitus with other diabetic kidney complication: Secondary | ICD-10-CM | POA: Diagnosis not present

## 2012-05-22 DIAGNOSIS — I10 Essential (primary) hypertension: Secondary | ICD-10-CM | POA: Diagnosis not present

## 2012-05-26 ENCOUNTER — Encounter (INDEPENDENT_AMBULATORY_CARE_PROVIDER_SITE_OTHER): Payer: Self-pay | Admitting: Internal Medicine

## 2012-05-26 ENCOUNTER — Ambulatory Visit (INDEPENDENT_AMBULATORY_CARE_PROVIDER_SITE_OTHER): Payer: Medicare Other | Admitting: Internal Medicine

## 2012-05-26 VITALS — BP 110/80 | HR 72 | Temp 97.8°F | Resp 20 | Ht 67.0 in | Wt 141.8 lb

## 2012-05-26 DIAGNOSIS — E1149 Type 2 diabetes mellitus with other diabetic neurological complication: Secondary | ICD-10-CM

## 2012-05-26 DIAGNOSIS — K3184 Gastroparesis: Secondary | ICD-10-CM

## 2012-05-26 DIAGNOSIS — E1142 Type 2 diabetes mellitus with diabetic polyneuropathy: Secondary | ICD-10-CM | POA: Diagnosis not present

## 2012-05-26 DIAGNOSIS — I82409 Acute embolism and thrombosis of unspecified deep veins of unspecified lower extremity: Secondary | ICD-10-CM | POA: Diagnosis not present

## 2012-05-26 NOTE — Progress Notes (Signed)
Presenting complaint;   follow for weight loss and diabetic gastroparesis.  Subjective:  Eduardo Lee is 78 year old Caucasian male who is here for scheduled visit. He was last seen on 10/28/2011 when he weighed 138 pounds. Patient tells me that he fell that evening and entered up in the hospital for right hip fracture and underwent surgery. He was in rehabilitation and finally has recovered fully. He has gained almost 4 pounds since his last visit. He says his appetite is good. He has not noted any drops since he ran out of domperidone. He denies heartburn, nausea, vomiting or regurgitation. He occasionally strangles with liquids. His bowels are moving regularly.  Current Medications: Current Outpatient Prescriptions  Medication Sig Dispense Refill  . citalopram (CELEXA) 20 MG tablet Take 20 mg by mouth daily.        Marland Kitchen ezetimibe (ZETIA) 10 MG tablet Take 10 mg by mouth daily.        . insulin aspart (NOVOLOG) 100 UNIT/ML injection Inject 6 Units into the skin 3 (three) times daily before meals.       . insulin glargine (LANTUS) 100 UNIT/ML injection Inject 10 Units into the skin at bedtime.        Marland Kitchen latanoprost (XALATAN) 0.005 % ophthalmic solution Place 1 drop into both eyes daily.        . vitamin B-12 (CYANOCOBALAMIN) 1000 MCG tablet Take 1,000 mcg by mouth daily.        . Vitamin D, Ergocalciferol, (DRISDOL) 50000 UNITS CAPS Take 50,000 Units by mouth every 7 (seven) days.       . WARFARIN SODIUM PO Take by mouth.           Objective: Blood pressure 110/80, pulse 72, temperature 97.8 F (36.6 C), temperature source Oral, resp. rate 20, height 5\' 7"  (1.702 m), weight 141 lb 12.8 oz (64.32 kg). Patient is alert and appears to be comfortable Conjunctiva is pink. Sclera is nonicteric Oropharyngeal mucosa is normal. No neck masses or thyromegaly noted. Abdomen is flat and soft without organomegaly or masses. No LE edema or clubbing noted.    Assessment:  #1. Gastroparesis. He is doing  well. He is neither on PPI nor promotility agent. As long as he is asymptomatic no need to add another medication. Suspected gastric motility has improved with optimal glycemic control. #2. Weight loss. He has gained almost 4 pounds since his last visit which is very reassuring.   Plan: Patient advised to call if symptoms relapse. Office visit on as-needed basis.

## 2012-05-26 NOTE — Patient Instructions (Addendum)
Call if GI symptoms relapse

## 2012-06-09 DIAGNOSIS — S2249XA Multiple fractures of ribs, unspecified side, initial encounter for closed fracture: Secondary | ICD-10-CM | POA: Diagnosis not present

## 2012-06-26 DIAGNOSIS — I824Z9 Acute embolism and thrombosis of unspecified deep veins of unspecified distal lower extremity: Secondary | ICD-10-CM | POA: Diagnosis not present

## 2012-06-26 DIAGNOSIS — G5790 Unspecified mononeuropathy of unspecified lower limb: Secondary | ICD-10-CM | POA: Diagnosis not present

## 2012-06-26 DIAGNOSIS — I2699 Other pulmonary embolism without acute cor pulmonale: Secondary | ICD-10-CM | POA: Diagnosis not present

## 2012-06-26 DIAGNOSIS — E119 Type 2 diabetes mellitus without complications: Secondary | ICD-10-CM | POA: Diagnosis not present

## 2012-06-30 DIAGNOSIS — S2249XA Multiple fractures of ribs, unspecified side, initial encounter for closed fracture: Secondary | ICD-10-CM | POA: Diagnosis not present

## 2012-07-17 DIAGNOSIS — I82409 Acute embolism and thrombosis of unspecified deep veins of unspecified lower extremity: Secondary | ICD-10-CM | POA: Diagnosis not present

## 2012-07-23 DIAGNOSIS — Z4789 Encounter for other orthopedic aftercare: Secondary | ICD-10-CM | POA: Diagnosis not present

## 2012-08-26 DIAGNOSIS — I82409 Acute embolism and thrombosis of unspecified deep veins of unspecified lower extremity: Secondary | ICD-10-CM | POA: Diagnosis not present

## 2012-08-26 DIAGNOSIS — D66 Hereditary factor VIII deficiency: Secondary | ICD-10-CM | POA: Diagnosis not present

## 2012-08-27 DIAGNOSIS — E1065 Type 1 diabetes mellitus with hyperglycemia: Secondary | ICD-10-CM | POA: Diagnosis not present

## 2012-08-27 DIAGNOSIS — E559 Vitamin D deficiency, unspecified: Secondary | ICD-10-CM | POA: Diagnosis not present

## 2012-08-27 DIAGNOSIS — E785 Hyperlipidemia, unspecified: Secondary | ICD-10-CM | POA: Diagnosis not present

## 2012-08-31 DIAGNOSIS — E1029 Type 1 diabetes mellitus with other diabetic kidney complication: Secondary | ICD-10-CM | POA: Diagnosis not present

## 2012-08-31 DIAGNOSIS — E11359 Type 2 diabetes mellitus with proliferative diabetic retinopathy without macular edema: Secondary | ICD-10-CM | POA: Diagnosis not present

## 2012-08-31 DIAGNOSIS — E559 Vitamin D deficiency, unspecified: Secondary | ICD-10-CM | POA: Diagnosis not present

## 2012-08-31 DIAGNOSIS — E1142 Type 2 diabetes mellitus with diabetic polyneuropathy: Secondary | ICD-10-CM | POA: Diagnosis not present

## 2012-08-31 DIAGNOSIS — E1049 Type 1 diabetes mellitus with other diabetic neurological complication: Secondary | ICD-10-CM | POA: Diagnosis not present

## 2012-08-31 DIAGNOSIS — E1039 Type 1 diabetes mellitus with other diabetic ophthalmic complication: Secondary | ICD-10-CM | POA: Diagnosis not present

## 2012-08-31 DIAGNOSIS — E1065 Type 1 diabetes mellitus with hyperglycemia: Secondary | ICD-10-CM | POA: Diagnosis not present

## 2012-08-31 DIAGNOSIS — E785 Hyperlipidemia, unspecified: Secondary | ICD-10-CM | POA: Diagnosis not present

## 2012-08-31 DIAGNOSIS — Z23 Encounter for immunization: Secondary | ICD-10-CM | POA: Diagnosis not present

## 2012-09-25 DIAGNOSIS — E119 Type 2 diabetes mellitus without complications: Secondary | ICD-10-CM | POA: Diagnosis not present

## 2012-09-25 DIAGNOSIS — I82409 Acute embolism and thrombosis of unspecified deep veins of unspecified lower extremity: Secondary | ICD-10-CM | POA: Diagnosis not present

## 2012-09-25 DIAGNOSIS — M129 Arthropathy, unspecified: Secondary | ICD-10-CM | POA: Diagnosis not present

## 2012-10-19 DIAGNOSIS — I82409 Acute embolism and thrombosis of unspecified deep veins of unspecified lower extremity: Secondary | ICD-10-CM | POA: Diagnosis not present

## 2012-11-17 DIAGNOSIS — E11359 Type 2 diabetes mellitus with proliferative diabetic retinopathy without macular edema: Secondary | ICD-10-CM | POA: Diagnosis not present

## 2012-11-17 DIAGNOSIS — E1139 Type 2 diabetes mellitus with other diabetic ophthalmic complication: Secondary | ICD-10-CM | POA: Diagnosis not present

## 2012-11-17 DIAGNOSIS — E1165 Type 2 diabetes mellitus with hyperglycemia: Secondary | ICD-10-CM | POA: Diagnosis not present

## 2012-11-17 DIAGNOSIS — H35379 Puckering of macula, unspecified eye: Secondary | ICD-10-CM | POA: Diagnosis not present

## 2012-11-17 DIAGNOSIS — H4011X0 Primary open-angle glaucoma, stage unspecified: Secondary | ICD-10-CM | POA: Diagnosis not present

## 2012-11-17 DIAGNOSIS — H353190 Nonexudative age-related macular degeneration, unspecified eye, stage unspecified: Secondary | ICD-10-CM | POA: Diagnosis not present

## 2012-11-27 DIAGNOSIS — E559 Vitamin D deficiency, unspecified: Secondary | ICD-10-CM | POA: Diagnosis not present

## 2012-11-27 DIAGNOSIS — E1029 Type 1 diabetes mellitus with other diabetic kidney complication: Secondary | ICD-10-CM | POA: Diagnosis not present

## 2012-11-27 DIAGNOSIS — E785 Hyperlipidemia, unspecified: Secondary | ICD-10-CM | POA: Diagnosis not present

## 2012-12-08 DIAGNOSIS — E559 Vitamin D deficiency, unspecified: Secondary | ICD-10-CM | POA: Diagnosis not present

## 2012-12-08 DIAGNOSIS — I69919 Unspecified symptoms and signs involving cognitive functions following unspecified cerebrovascular disease: Secondary | ICD-10-CM | POA: Diagnosis not present

## 2012-12-08 DIAGNOSIS — E785 Hyperlipidemia, unspecified: Secondary | ICD-10-CM | POA: Diagnosis not present

## 2012-12-08 DIAGNOSIS — E119 Type 2 diabetes mellitus without complications: Secondary | ICD-10-CM | POA: Diagnosis not present

## 2012-12-08 DIAGNOSIS — E1142 Type 2 diabetes mellitus with diabetic polyneuropathy: Secondary | ICD-10-CM | POA: Diagnosis not present

## 2012-12-08 DIAGNOSIS — I10 Essential (primary) hypertension: Secondary | ICD-10-CM | POA: Diagnosis not present

## 2012-12-08 DIAGNOSIS — E1029 Type 1 diabetes mellitus with other diabetic kidney complication: Secondary | ICD-10-CM | POA: Diagnosis not present

## 2012-12-08 DIAGNOSIS — M810 Age-related osteoporosis without current pathological fracture: Secondary | ICD-10-CM | POA: Diagnosis not present

## 2012-12-08 DIAGNOSIS — E1049 Type 1 diabetes mellitus with other diabetic neurological complication: Secondary | ICD-10-CM | POA: Diagnosis not present

## 2012-12-08 DIAGNOSIS — E1065 Type 1 diabetes mellitus with hyperglycemia: Secondary | ICD-10-CM | POA: Diagnosis not present

## 2013-03-09 DIAGNOSIS — E1029 Type 1 diabetes mellitus with other diabetic kidney complication: Secondary | ICD-10-CM | POA: Diagnosis not present

## 2013-03-09 DIAGNOSIS — E1065 Type 1 diabetes mellitus with hyperglycemia: Secondary | ICD-10-CM | POA: Diagnosis not present

## 2013-03-09 DIAGNOSIS — E785 Hyperlipidemia, unspecified: Secondary | ICD-10-CM | POA: Diagnosis not present

## 2013-03-09 DIAGNOSIS — E559 Vitamin D deficiency, unspecified: Secondary | ICD-10-CM | POA: Diagnosis not present

## 2013-03-13 DIAGNOSIS — M25559 Pain in unspecified hip: Secondary | ICD-10-CM | POA: Diagnosis not present

## 2013-03-13 DIAGNOSIS — F329 Major depressive disorder, single episode, unspecified: Secondary | ICD-10-CM | POA: Diagnosis present

## 2013-03-13 DIAGNOSIS — S72143A Displaced intertrochanteric fracture of unspecified femur, initial encounter for closed fracture: Secondary | ICD-10-CM | POA: Diagnosis not present

## 2013-03-13 DIAGNOSIS — S72109A Unspecified trochanteric fracture of unspecified femur, initial encounter for closed fracture: Secondary | ICD-10-CM | POA: Diagnosis not present

## 2013-03-13 DIAGNOSIS — E119 Type 2 diabetes mellitus without complications: Secondary | ICD-10-CM | POA: Diagnosis not present

## 2013-03-13 DIAGNOSIS — D649 Anemia, unspecified: Secondary | ICD-10-CM | POA: Diagnosis not present

## 2013-03-13 DIAGNOSIS — D62 Acute posthemorrhagic anemia: Secondary | ICD-10-CM | POA: Diagnosis not present

## 2013-03-13 DIAGNOSIS — Z87891 Personal history of nicotine dependence: Secondary | ICD-10-CM | POA: Diagnosis not present

## 2013-03-13 DIAGNOSIS — E161 Other hypoglycemia: Secondary | ICD-10-CM | POA: Diagnosis not present

## 2013-03-13 DIAGNOSIS — I959 Hypotension, unspecified: Secondary | ICD-10-CM | POA: Diagnosis not present

## 2013-03-13 DIAGNOSIS — S72309A Unspecified fracture of shaft of unspecified femur, initial encounter for closed fracture: Secondary | ICD-10-CM | POA: Diagnosis not present

## 2013-03-13 DIAGNOSIS — S99919A Unspecified injury of unspecified ankle, initial encounter: Secondary | ICD-10-CM | POA: Diagnosis not present

## 2013-03-13 DIAGNOSIS — IMO0002 Reserved for concepts with insufficient information to code with codable children: Secondary | ICD-10-CM | POA: Diagnosis not present

## 2013-03-13 DIAGNOSIS — Z794 Long term (current) use of insulin: Secondary | ICD-10-CM | POA: Diagnosis not present

## 2013-03-13 DIAGNOSIS — H409 Unspecified glaucoma: Secondary | ICD-10-CM | POA: Diagnosis not present

## 2013-03-13 DIAGNOSIS — S7290XA Unspecified fracture of unspecified femur, initial encounter for closed fracture: Secondary | ICD-10-CM | POA: Diagnosis not present

## 2013-03-13 DIAGNOSIS — S8990XA Unspecified injury of unspecified lower leg, initial encounter: Secondary | ICD-10-CM | POA: Diagnosis not present

## 2013-03-13 DIAGNOSIS — S72009A Fracture of unspecified part of neck of unspecified femur, initial encounter for closed fracture: Secondary | ICD-10-CM | POA: Diagnosis not present

## 2013-03-13 DIAGNOSIS — S79929A Unspecified injury of unspecified thigh, initial encounter: Secondary | ICD-10-CM | POA: Diagnosis not present

## 2013-03-13 DIAGNOSIS — I10 Essential (primary) hypertension: Secondary | ICD-10-CM | POA: Diagnosis present

## 2013-03-13 DIAGNOSIS — S99929A Unspecified injury of unspecified foot, initial encounter: Secondary | ICD-10-CM | POA: Diagnosis not present

## 2013-03-13 DIAGNOSIS — Z23 Encounter for immunization: Secondary | ICD-10-CM | POA: Diagnosis not present

## 2013-03-13 DIAGNOSIS — R7301 Impaired fasting glucose: Secondary | ICD-10-CM | POA: Diagnosis not present

## 2013-03-13 DIAGNOSIS — Z8673 Personal history of transient ischemic attack (TIA), and cerebral infarction without residual deficits: Secondary | ICD-10-CM | POA: Diagnosis not present

## 2013-03-13 DIAGNOSIS — R9430 Abnormal result of cardiovascular function study, unspecified: Secondary | ICD-10-CM | POA: Diagnosis not present

## 2013-03-13 DIAGNOSIS — E785 Hyperlipidemia, unspecified: Secondary | ICD-10-CM | POA: Diagnosis not present

## 2013-03-18 DIAGNOSIS — R269 Unspecified abnormalities of gait and mobility: Secondary | ICD-10-CM | POA: Diagnosis not present

## 2013-03-18 DIAGNOSIS — S72009A Fracture of unspecified part of neck of unspecified femur, initial encounter for closed fracture: Secondary | ICD-10-CM | POA: Diagnosis not present

## 2013-03-18 DIAGNOSIS — Z9181 History of falling: Secondary | ICD-10-CM | POA: Diagnosis not present

## 2013-03-18 DIAGNOSIS — G609 Hereditary and idiopathic neuropathy, unspecified: Secondary | ICD-10-CM | POA: Diagnosis not present

## 2013-03-18 DIAGNOSIS — S72009D Fracture of unspecified part of neck of unspecified femur, subsequent encounter for closed fracture with routine healing: Secondary | ICD-10-CM | POA: Diagnosis not present

## 2013-03-18 DIAGNOSIS — Z794 Long term (current) use of insulin: Secondary | ICD-10-CM | POA: Diagnosis not present

## 2013-03-18 DIAGNOSIS — E119 Type 2 diabetes mellitus without complications: Secondary | ICD-10-CM | POA: Diagnosis not present

## 2013-03-18 DIAGNOSIS — R279 Unspecified lack of coordination: Secondary | ICD-10-CM | POA: Diagnosis not present

## 2013-03-18 DIAGNOSIS — H548 Legal blindness, as defined in USA: Secondary | ICD-10-CM | POA: Diagnosis not present

## 2013-03-18 DIAGNOSIS — F329 Major depressive disorder, single episode, unspecified: Secondary | ICD-10-CM | POA: Diagnosis not present

## 2013-03-19 DIAGNOSIS — R279 Unspecified lack of coordination: Secondary | ICD-10-CM | POA: Diagnosis not present

## 2013-03-19 DIAGNOSIS — E119 Type 2 diabetes mellitus without complications: Secondary | ICD-10-CM | POA: Diagnosis not present

## 2013-03-19 DIAGNOSIS — S72009A Fracture of unspecified part of neck of unspecified femur, initial encounter for closed fracture: Secondary | ICD-10-CM | POA: Diagnosis not present

## 2013-03-19 DIAGNOSIS — R269 Unspecified abnormalities of gait and mobility: Secondary | ICD-10-CM | POA: Diagnosis not present

## 2013-03-19 DIAGNOSIS — S72009D Fracture of unspecified part of neck of unspecified femur, subsequent encounter for closed fracture with routine healing: Secondary | ICD-10-CM | POA: Diagnosis not present

## 2013-03-19 DIAGNOSIS — G609 Hereditary and idiopathic neuropathy, unspecified: Secondary | ICD-10-CM | POA: Diagnosis not present

## 2013-03-20 DIAGNOSIS — E119 Type 2 diabetes mellitus without complications: Secondary | ICD-10-CM | POA: Diagnosis not present

## 2013-03-20 DIAGNOSIS — R279 Unspecified lack of coordination: Secondary | ICD-10-CM | POA: Diagnosis not present

## 2013-03-20 DIAGNOSIS — R269 Unspecified abnormalities of gait and mobility: Secondary | ICD-10-CM | POA: Diagnosis not present

## 2013-03-20 DIAGNOSIS — S72009A Fracture of unspecified part of neck of unspecified femur, initial encounter for closed fracture: Secondary | ICD-10-CM | POA: Diagnosis not present

## 2013-03-20 DIAGNOSIS — G609 Hereditary and idiopathic neuropathy, unspecified: Secondary | ICD-10-CM | POA: Diagnosis not present

## 2013-03-20 DIAGNOSIS — S72009D Fracture of unspecified part of neck of unspecified femur, subsequent encounter for closed fracture with routine healing: Secondary | ICD-10-CM | POA: Diagnosis not present

## 2013-03-21 DIAGNOSIS — G609 Hereditary and idiopathic neuropathy, unspecified: Secondary | ICD-10-CM | POA: Diagnosis not present

## 2013-03-21 DIAGNOSIS — S72009D Fracture of unspecified part of neck of unspecified femur, subsequent encounter for closed fracture with routine healing: Secondary | ICD-10-CM | POA: Diagnosis not present

## 2013-03-21 DIAGNOSIS — R269 Unspecified abnormalities of gait and mobility: Secondary | ICD-10-CM | POA: Diagnosis not present

## 2013-03-21 DIAGNOSIS — S72009A Fracture of unspecified part of neck of unspecified femur, initial encounter for closed fracture: Secondary | ICD-10-CM | POA: Diagnosis not present

## 2013-03-21 DIAGNOSIS — R279 Unspecified lack of coordination: Secondary | ICD-10-CM | POA: Diagnosis not present

## 2013-03-21 DIAGNOSIS — E119 Type 2 diabetes mellitus without complications: Secondary | ICD-10-CM | POA: Diagnosis not present

## 2013-03-22 DIAGNOSIS — S72009D Fracture of unspecified part of neck of unspecified femur, subsequent encounter for closed fracture with routine healing: Secondary | ICD-10-CM | POA: Diagnosis not present

## 2013-03-22 DIAGNOSIS — S72009A Fracture of unspecified part of neck of unspecified femur, initial encounter for closed fracture: Secondary | ICD-10-CM | POA: Diagnosis not present

## 2013-03-22 DIAGNOSIS — R269 Unspecified abnormalities of gait and mobility: Secondary | ICD-10-CM | POA: Diagnosis not present

## 2013-03-22 DIAGNOSIS — G609 Hereditary and idiopathic neuropathy, unspecified: Secondary | ICD-10-CM | POA: Diagnosis not present

## 2013-03-22 DIAGNOSIS — R279 Unspecified lack of coordination: Secondary | ICD-10-CM | POA: Diagnosis not present

## 2013-03-22 DIAGNOSIS — E119 Type 2 diabetes mellitus without complications: Secondary | ICD-10-CM | POA: Diagnosis not present

## 2013-03-23 DIAGNOSIS — S72009D Fracture of unspecified part of neck of unspecified femur, subsequent encounter for closed fracture with routine healing: Secondary | ICD-10-CM | POA: Diagnosis not present

## 2013-03-23 DIAGNOSIS — E119 Type 2 diabetes mellitus without complications: Secondary | ICD-10-CM | POA: Diagnosis not present

## 2013-03-23 DIAGNOSIS — R279 Unspecified lack of coordination: Secondary | ICD-10-CM | POA: Diagnosis not present

## 2013-03-23 DIAGNOSIS — S72009A Fracture of unspecified part of neck of unspecified femur, initial encounter for closed fracture: Secondary | ICD-10-CM | POA: Diagnosis not present

## 2013-03-23 DIAGNOSIS — G609 Hereditary and idiopathic neuropathy, unspecified: Secondary | ICD-10-CM | POA: Diagnosis not present

## 2013-03-23 DIAGNOSIS — R269 Unspecified abnormalities of gait and mobility: Secondary | ICD-10-CM | POA: Diagnosis not present

## 2013-03-24 DIAGNOSIS — M79609 Pain in unspecified limb: Secondary | ICD-10-CM | POA: Diagnosis not present

## 2013-03-24 DIAGNOSIS — S72143A Displaced intertrochanteric fracture of unspecified femur, initial encounter for closed fracture: Secondary | ICD-10-CM | POA: Diagnosis not present

## 2013-03-26 DIAGNOSIS — S72009A Fracture of unspecified part of neck of unspecified femur, initial encounter for closed fracture: Secondary | ICD-10-CM | POA: Diagnosis not present

## 2013-03-26 DIAGNOSIS — E119 Type 2 diabetes mellitus without complications: Secondary | ICD-10-CM | POA: Diagnosis not present

## 2013-03-26 DIAGNOSIS — G609 Hereditary and idiopathic neuropathy, unspecified: Secondary | ICD-10-CM | POA: Diagnosis not present

## 2013-03-26 DIAGNOSIS — R279 Unspecified lack of coordination: Secondary | ICD-10-CM | POA: Diagnosis not present

## 2013-03-26 DIAGNOSIS — R269 Unspecified abnormalities of gait and mobility: Secondary | ICD-10-CM | POA: Diagnosis not present

## 2013-03-26 DIAGNOSIS — S72009D Fracture of unspecified part of neck of unspecified femur, subsequent encounter for closed fracture with routine healing: Secondary | ICD-10-CM | POA: Diagnosis not present

## 2013-03-29 DIAGNOSIS — R269 Unspecified abnormalities of gait and mobility: Secondary | ICD-10-CM | POA: Diagnosis not present

## 2013-03-29 DIAGNOSIS — E119 Type 2 diabetes mellitus without complications: Secondary | ICD-10-CM | POA: Diagnosis not present

## 2013-03-29 DIAGNOSIS — S72009A Fracture of unspecified part of neck of unspecified femur, initial encounter for closed fracture: Secondary | ICD-10-CM | POA: Diagnosis not present

## 2013-03-29 DIAGNOSIS — G609 Hereditary and idiopathic neuropathy, unspecified: Secondary | ICD-10-CM | POA: Diagnosis not present

## 2013-03-29 DIAGNOSIS — S72009D Fracture of unspecified part of neck of unspecified femur, subsequent encounter for closed fracture with routine healing: Secondary | ICD-10-CM | POA: Diagnosis not present

## 2013-03-29 DIAGNOSIS — R279 Unspecified lack of coordination: Secondary | ICD-10-CM | POA: Diagnosis not present

## 2013-03-30 DIAGNOSIS — S72009D Fracture of unspecified part of neck of unspecified femur, subsequent encounter for closed fracture with routine healing: Secondary | ICD-10-CM | POA: Diagnosis not present

## 2013-03-30 DIAGNOSIS — R279 Unspecified lack of coordination: Secondary | ICD-10-CM | POA: Diagnosis not present

## 2013-03-30 DIAGNOSIS — G609 Hereditary and idiopathic neuropathy, unspecified: Secondary | ICD-10-CM | POA: Diagnosis not present

## 2013-03-30 DIAGNOSIS — R269 Unspecified abnormalities of gait and mobility: Secondary | ICD-10-CM | POA: Diagnosis not present

## 2013-03-30 DIAGNOSIS — E119 Type 2 diabetes mellitus without complications: Secondary | ICD-10-CM | POA: Diagnosis not present

## 2013-03-30 DIAGNOSIS — S72009A Fracture of unspecified part of neck of unspecified femur, initial encounter for closed fracture: Secondary | ICD-10-CM | POA: Diagnosis not present

## 2013-03-31 DIAGNOSIS — S72009A Fracture of unspecified part of neck of unspecified femur, initial encounter for closed fracture: Secondary | ICD-10-CM | POA: Diagnosis not present

## 2013-03-31 DIAGNOSIS — R269 Unspecified abnormalities of gait and mobility: Secondary | ICD-10-CM | POA: Diagnosis not present

## 2013-03-31 DIAGNOSIS — E119 Type 2 diabetes mellitus without complications: Secondary | ICD-10-CM | POA: Diagnosis not present

## 2013-03-31 DIAGNOSIS — S72009D Fracture of unspecified part of neck of unspecified femur, subsequent encounter for closed fracture with routine healing: Secondary | ICD-10-CM | POA: Diagnosis not present

## 2013-03-31 DIAGNOSIS — R279 Unspecified lack of coordination: Secondary | ICD-10-CM | POA: Diagnosis not present

## 2013-03-31 DIAGNOSIS — G609 Hereditary and idiopathic neuropathy, unspecified: Secondary | ICD-10-CM | POA: Diagnosis not present

## 2013-04-01 DIAGNOSIS — M25559 Pain in unspecified hip: Secondary | ICD-10-CM | POA: Diagnosis not present

## 2013-04-01 DIAGNOSIS — R262 Difficulty in walking, not elsewhere classified: Secondary | ICD-10-CM | POA: Diagnosis not present

## 2013-04-01 DIAGNOSIS — E1149 Type 2 diabetes mellitus with other diabetic neurological complication: Secondary | ICD-10-CM | POA: Diagnosis not present

## 2013-04-07 DIAGNOSIS — E1149 Type 2 diabetes mellitus with other diabetic neurological complication: Secondary | ICD-10-CM | POA: Diagnosis not present

## 2013-04-07 DIAGNOSIS — R262 Difficulty in walking, not elsewhere classified: Secondary | ICD-10-CM | POA: Diagnosis not present

## 2013-04-07 DIAGNOSIS — M25559 Pain in unspecified hip: Secondary | ICD-10-CM | POA: Diagnosis not present

## 2013-04-09 DIAGNOSIS — E1149 Type 2 diabetes mellitus with other diabetic neurological complication: Secondary | ICD-10-CM | POA: Diagnosis not present

## 2013-04-09 DIAGNOSIS — R262 Difficulty in walking, not elsewhere classified: Secondary | ICD-10-CM | POA: Diagnosis not present

## 2013-04-09 DIAGNOSIS — M25559 Pain in unspecified hip: Secondary | ICD-10-CM | POA: Diagnosis not present

## 2013-04-13 DIAGNOSIS — R262 Difficulty in walking, not elsewhere classified: Secondary | ICD-10-CM | POA: Diagnosis not present

## 2013-04-13 DIAGNOSIS — M25559 Pain in unspecified hip: Secondary | ICD-10-CM | POA: Diagnosis not present

## 2013-04-13 DIAGNOSIS — E1149 Type 2 diabetes mellitus with other diabetic neurological complication: Secondary | ICD-10-CM | POA: Diagnosis not present

## 2013-04-14 DIAGNOSIS — E1149 Type 2 diabetes mellitus with other diabetic neurological complication: Secondary | ICD-10-CM | POA: Diagnosis not present

## 2013-04-14 DIAGNOSIS — M25559 Pain in unspecified hip: Secondary | ICD-10-CM | POA: Diagnosis not present

## 2013-04-14 DIAGNOSIS — R262 Difficulty in walking, not elsewhere classified: Secondary | ICD-10-CM | POA: Diagnosis not present

## 2013-04-16 DIAGNOSIS — M25559 Pain in unspecified hip: Secondary | ICD-10-CM | POA: Diagnosis not present

## 2013-04-16 DIAGNOSIS — E1149 Type 2 diabetes mellitus with other diabetic neurological complication: Secondary | ICD-10-CM | POA: Diagnosis not present

## 2013-04-16 DIAGNOSIS — R262 Difficulty in walking, not elsewhere classified: Secondary | ICD-10-CM | POA: Diagnosis not present

## 2013-04-19 DIAGNOSIS — R262 Difficulty in walking, not elsewhere classified: Secondary | ICD-10-CM | POA: Diagnosis not present

## 2013-04-19 DIAGNOSIS — M25559 Pain in unspecified hip: Secondary | ICD-10-CM | POA: Diagnosis not present

## 2013-04-19 DIAGNOSIS — IMO0002 Reserved for concepts with insufficient information to code with codable children: Secondary | ICD-10-CM | POA: Diagnosis not present

## 2013-04-19 DIAGNOSIS — S72143A Displaced intertrochanteric fracture of unspecified femur, initial encounter for closed fracture: Secondary | ICD-10-CM | POA: Diagnosis not present

## 2013-04-19 DIAGNOSIS — E1149 Type 2 diabetes mellitus with other diabetic neurological complication: Secondary | ICD-10-CM | POA: Diagnosis not present

## 2013-04-19 DIAGNOSIS — Z4789 Encounter for other orthopedic aftercare: Secondary | ICD-10-CM | POA: Diagnosis not present

## 2013-04-19 DIAGNOSIS — M729 Fibroblastic disorder, unspecified: Secondary | ICD-10-CM | POA: Diagnosis not present

## 2013-04-21 ENCOUNTER — Other Ambulatory Visit: Payer: Self-pay | Admitting: Family Medicine

## 2013-04-21 DIAGNOSIS — M25559 Pain in unspecified hip: Secondary | ICD-10-CM | POA: Diagnosis not present

## 2013-04-21 DIAGNOSIS — E1149 Type 2 diabetes mellitus with other diabetic neurological complication: Secondary | ICD-10-CM | POA: Diagnosis not present

## 2013-04-21 DIAGNOSIS — R262 Difficulty in walking, not elsewhere classified: Secondary | ICD-10-CM | POA: Diagnosis not present

## 2013-04-23 DIAGNOSIS — M25559 Pain in unspecified hip: Secondary | ICD-10-CM | POA: Diagnosis not present

## 2013-04-23 DIAGNOSIS — E1149 Type 2 diabetes mellitus with other diabetic neurological complication: Secondary | ICD-10-CM | POA: Diagnosis not present

## 2013-04-23 DIAGNOSIS — R262 Difficulty in walking, not elsewhere classified: Secondary | ICD-10-CM | POA: Diagnosis not present

## 2013-04-27 DIAGNOSIS — E1149 Type 2 diabetes mellitus with other diabetic neurological complication: Secondary | ICD-10-CM | POA: Diagnosis not present

## 2013-04-27 DIAGNOSIS — M25559 Pain in unspecified hip: Secondary | ICD-10-CM | POA: Diagnosis not present

## 2013-04-27 DIAGNOSIS — R262 Difficulty in walking, not elsewhere classified: Secondary | ICD-10-CM | POA: Diagnosis not present

## 2013-04-28 DIAGNOSIS — M25559 Pain in unspecified hip: Secondary | ICD-10-CM | POA: Diagnosis not present

## 2013-04-28 DIAGNOSIS — E1149 Type 2 diabetes mellitus with other diabetic neurological complication: Secondary | ICD-10-CM | POA: Diagnosis not present

## 2013-04-28 DIAGNOSIS — R262 Difficulty in walking, not elsewhere classified: Secondary | ICD-10-CM | POA: Diagnosis not present

## 2013-04-29 ENCOUNTER — Encounter: Payer: Self-pay | Admitting: *Deleted

## 2013-04-30 DIAGNOSIS — R262 Difficulty in walking, not elsewhere classified: Secondary | ICD-10-CM | POA: Diagnosis not present

## 2013-04-30 DIAGNOSIS — E1149 Type 2 diabetes mellitus with other diabetic neurological complication: Secondary | ICD-10-CM | POA: Diagnosis not present

## 2013-04-30 DIAGNOSIS — M25559 Pain in unspecified hip: Secondary | ICD-10-CM | POA: Diagnosis not present

## 2013-05-03 DIAGNOSIS — M25559 Pain in unspecified hip: Secondary | ICD-10-CM | POA: Diagnosis not present

## 2013-05-03 DIAGNOSIS — E1149 Type 2 diabetes mellitus with other diabetic neurological complication: Secondary | ICD-10-CM | POA: Diagnosis not present

## 2013-05-03 DIAGNOSIS — R262 Difficulty in walking, not elsewhere classified: Secondary | ICD-10-CM | POA: Diagnosis not present

## 2013-05-05 DIAGNOSIS — M25559 Pain in unspecified hip: Secondary | ICD-10-CM | POA: Diagnosis not present

## 2013-05-05 DIAGNOSIS — R262 Difficulty in walking, not elsewhere classified: Secondary | ICD-10-CM | POA: Diagnosis not present

## 2013-05-05 DIAGNOSIS — E1149 Type 2 diabetes mellitus with other diabetic neurological complication: Secondary | ICD-10-CM | POA: Diagnosis not present

## 2013-05-06 ENCOUNTER — Other Ambulatory Visit: Payer: Self-pay | Admitting: Family Medicine

## 2013-05-07 ENCOUNTER — Ambulatory Visit (INDEPENDENT_AMBULATORY_CARE_PROVIDER_SITE_OTHER): Payer: Medicare Other | Admitting: Family Medicine

## 2013-05-07 ENCOUNTER — Observation Stay (HOSPITAL_COMMUNITY)
Admission: AD | Admit: 2013-05-07 | Discharge: 2013-05-08 | DRG: 301 | Disposition: A | Discharge status: Home / Self Care | Payer: Medicare Other | Source: Ambulatory Visit | Attending: Internal Medicine | Admitting: Internal Medicine

## 2013-05-07 ENCOUNTER — Ambulatory Visit (HOSPITAL_COMMUNITY)
Admission: RE | Admit: 2013-05-07 | Discharge: 2013-05-07 | Disposition: A | Discharge status: Home / Self Care | Payer: Medicare Other | Source: Ambulatory Visit | Attending: Family Medicine | Admitting: Family Medicine

## 2013-05-07 ENCOUNTER — Encounter: Payer: Self-pay | Admitting: Family Medicine

## 2013-05-07 ENCOUNTER — Encounter (HOSPITAL_COMMUNITY): Payer: Self-pay | Admitting: *Deleted

## 2013-05-07 VITALS — BP 122/66 | HR 70 | Ht 68.0 in | Wt 142.1 lb

## 2013-05-07 DIAGNOSIS — E1149 Type 2 diabetes mellitus with other diabetic neurological complication: Secondary | ICD-10-CM | POA: Insufficient documentation

## 2013-05-07 DIAGNOSIS — E1022 Type 1 diabetes mellitus with diabetic chronic kidney disease: Secondary | ICD-10-CM | POA: Diagnosis present

## 2013-05-07 DIAGNOSIS — I824Y9 Acute embolism and thrombosis of unspecified deep veins of unspecified proximal lower extremity: Principal | ICD-10-CM | POA: Insufficient documentation

## 2013-05-07 DIAGNOSIS — I129 Hypertensive chronic kidney disease with stage 1 through stage 4 chronic kidney disease, or unspecified chronic kidney disease: Secondary | ICD-10-CM | POA: Diagnosis present

## 2013-05-07 DIAGNOSIS — E11359 Type 2 diabetes mellitus with proliferative diabetic retinopathy without macular edema: Secondary | ICD-10-CM | POA: Diagnosis not present

## 2013-05-07 DIAGNOSIS — Z86718 Personal history of other venous thrombosis and embolism: Secondary | ICD-10-CM | POA: Diagnosis not present

## 2013-05-07 DIAGNOSIS — E119 Type 2 diabetes mellitus without complications: Secondary | ICD-10-CM | POA: Diagnosis not present

## 2013-05-07 DIAGNOSIS — E1065 Type 1 diabetes mellitus with hyperglycemia: Secondary | ICD-10-CM | POA: Diagnosis not present

## 2013-05-07 DIAGNOSIS — Z86711 Personal history of pulmonary embolism: Secondary | ICD-10-CM | POA: Insufficient documentation

## 2013-05-07 DIAGNOSIS — K3184 Gastroparesis: Secondary | ICD-10-CM | POA: Insufficient documentation

## 2013-05-07 DIAGNOSIS — I82402 Acute embolism and thrombosis of unspecified deep veins of left lower extremity: Secondary | ICD-10-CM

## 2013-05-07 DIAGNOSIS — I82409 Acute embolism and thrombosis of unspecified deep veins of unspecified lower extremity: Secondary | ICD-10-CM | POA: Diagnosis not present

## 2013-05-07 DIAGNOSIS — I10 Essential (primary) hypertension: Secondary | ICD-10-CM

## 2013-05-07 DIAGNOSIS — Z79899 Other long term (current) drug therapy: Secondary | ICD-10-CM | POA: Insufficient documentation

## 2013-05-07 DIAGNOSIS — E1039 Type 1 diabetes mellitus with other diabetic ophthalmic complication: Secondary | ICD-10-CM | POA: Diagnosis not present

## 2013-05-07 DIAGNOSIS — S72141A Displaced intertrochanteric fracture of right femur, initial encounter for closed fracture: Secondary | ICD-10-CM

## 2013-05-07 DIAGNOSIS — E1143 Type 2 diabetes mellitus with diabetic autonomic (poly)neuropathy: Secondary | ICD-10-CM | POA: Diagnosis present

## 2013-05-07 DIAGNOSIS — E1142 Type 2 diabetes mellitus with diabetic polyneuropathy: Secondary | ICD-10-CM | POA: Diagnosis not present

## 2013-05-07 DIAGNOSIS — E1165 Type 2 diabetes mellitus with hyperglycemia: Secondary | ICD-10-CM | POA: Diagnosis present

## 2013-05-07 DIAGNOSIS — E785 Hyperlipidemia, unspecified: Secondary | ICD-10-CM | POA: Diagnosis not present

## 2013-05-07 DIAGNOSIS — IMO0002 Reserved for concepts with insufficient information to code with codable children: Secondary | ICD-10-CM

## 2013-05-07 DIAGNOSIS — S72009D Fracture of unspecified part of neck of unspecified femur, subsequent encounter for closed fracture with routine healing: Secondary | ICD-10-CM | POA: Insufficient documentation

## 2013-05-07 DIAGNOSIS — E1029 Type 1 diabetes mellitus with other diabetic kidney complication: Secondary | ICD-10-CM | POA: Diagnosis not present

## 2013-05-07 DIAGNOSIS — S72141S Displaced intertrochanteric fracture of right femur, sequela: Secondary | ICD-10-CM

## 2013-05-07 LAB — GLUCOSE, CAPILLARY
Glucose-Capillary: 183 mg/dL — ABNORMAL HIGH (ref 70–99)
Glucose-Capillary: 212 mg/dL — ABNORMAL HIGH (ref 70–99)

## 2013-05-07 LAB — BASIC METABOLIC PANEL
BUN: 18 mg/dL (ref 6–23)
CO2: 28 mEq/L (ref 19–32)
Calcium: 9.1 mg/dL (ref 8.4–10.5)
Glucose, Bld: 217 mg/dL — ABNORMAL HIGH (ref 70–99)
Sodium: 139 mEq/L (ref 135–145)

## 2013-05-07 MED ORDER — HYDROCODONE-ACETAMINOPHEN 5-325 MG PO TABS: 1.0000 | ORAL_TABLET | ORAL | Status: DC | PRN

## 2013-05-07 MED ORDER — POLYETHYLENE GLYCOL 3350 17 G PO PACK: 17.0000 g | PACK | Freq: Every day | ORAL | Status: DC | PRN

## 2013-05-07 MED ORDER — WARFARIN VIDEO
Freq: Once | Status: AC
  Administered 2013-05-08: 10:00:00

## 2013-05-07 MED ORDER — INSULIN ASPART 100 UNIT/ML ~~LOC~~ SOLN
0.0000 [IU] | Freq: Three times a day (TID) | SUBCUTANEOUS | Status: DC
  Administered 2013-05-07: 3 [IU] via SUBCUTANEOUS
  Administered 2013-05-08: 2 [IU] via SUBCUTANEOUS
  Administered 2013-05-08: 5 [IU] via SUBCUTANEOUS

## 2013-05-07 MED ORDER — ENOXAPARIN SODIUM 80 MG/0.8ML ~~LOC~~ SOLN
1.0000 mg/kg | Freq: Two times a day (BID) | SUBCUTANEOUS | Status: DC
  Administered 2013-05-08: 65 mg via SUBCUTANEOUS
  Filled 2013-05-07: qty 0.8

## 2013-05-07 MED ORDER — SODIUM CHLORIDE 0.9 % IV SOLN
INTRAVENOUS | Status: AC
  Administered 2013-05-07: 18:00:00 via INTRAVENOUS

## 2013-05-07 MED ORDER — WARFARIN - PHARMACIST DOSING INPATIENT: Status: DC

## 2013-05-07 MED ORDER — HYDROMORPHONE HCL PF 1 MG/ML IJ SOLN: 0.5000 mg | INTRAMUSCULAR | Status: DC | PRN

## 2013-05-07 MED ORDER — VITAMIN B-12 1000 MCG PO TABS
1000.0000 ug | ORAL_TABLET | Freq: Every day | ORAL | Status: DC
  Administered 2013-05-08: 1000 ug via ORAL
  Filled 2013-05-07: qty 1

## 2013-05-07 MED ORDER — BISACODYL 10 MG RE SUPP: 10.0000 mg | Freq: Every day | RECTAL | Status: DC | PRN

## 2013-05-07 MED ORDER — MAGNESIUM CITRATE PO SOLN: 1.0000 | Freq: Once | ORAL | Status: AC | PRN

## 2013-05-07 MED ORDER — ENOXAPARIN SODIUM 80 MG/0.8ML ~~LOC~~ SOLN
65.0000 mg | Freq: Once | SUBCUTANEOUS | Status: AC
  Administered 2013-05-07: 65 mg via SUBCUTANEOUS
  Filled 2013-05-07: qty 0.8

## 2013-05-07 MED ORDER — ENOXAPARIN SODIUM 150 MG/ML ~~LOC~~ SOLN: 1.0000 mg/kg | Freq: Two times a day (BID) | SUBCUTANEOUS | Status: DC

## 2013-05-07 MED ORDER — EZETIMIBE 10 MG PO TABS
10.0000 mg | ORAL_TABLET | Freq: Every day | ORAL | Status: DC
  Administered 2013-05-08: 10 mg via ORAL
  Filled 2013-05-07: qty 1

## 2013-05-07 MED ORDER — ACETAMINOPHEN 650 MG RE SUPP: 650.0000 mg | Freq: Four times a day (QID) | RECTAL | Status: DC | PRN

## 2013-05-07 MED ORDER — ALUM & MAG HYDROXIDE-SIMETH 200-200-20 MG/5ML PO SUSP: 30.0000 mL | Freq: Four times a day (QID) | ORAL | Status: DC | PRN

## 2013-05-07 MED ORDER — COUMADIN BOOK
Freq: Once | Status: AC
  Administered 2013-05-08: 10:00:00
  Filled 2013-05-07: qty 1

## 2013-05-07 MED ORDER — ASPIRIN EC 81 MG PO TBEC
81.0000 mg | DELAYED_RELEASE_TABLET | Freq: Every day | ORAL | Status: DC
  Administered 2013-05-08: 81 mg via ORAL
  Filled 2013-05-07: qty 1

## 2013-05-07 MED ORDER — INSULIN GLARGINE 100 UNIT/ML ~~LOC~~ SOLN
10.0000 [IU] | Freq: Every day | SUBCUTANEOUS | Status: DC
  Administered 2013-05-07: 10 [IU] via SUBCUTANEOUS
  Filled 2013-05-07 (×3): qty 0.1

## 2013-05-07 MED ORDER — ONDANSETRON HCL 4 MG PO TABS: 4.0000 mg | ORAL_TABLET | Freq: Four times a day (QID) | ORAL | Status: DC | PRN

## 2013-05-07 MED ORDER — WARFARIN SODIUM 5 MG PO TABS
5.0000 mg | ORAL_TABLET | Freq: Once | ORAL | Status: AC
  Administered 2013-05-07: 5 mg via ORAL
  Filled 2013-05-07: qty 1

## 2013-05-07 MED ORDER — LATANOPROST 0.005 % OP SOLN
1.0000 [drp] | Freq: Every day | OPHTHALMIC | Status: DC
  Administered 2013-05-08: 1 [drp] via OPHTHALMIC
  Filled 2013-05-07: qty 2.5

## 2013-05-07 MED ORDER — ONDANSETRON HCL 4 MG/2ML IJ SOLN: 4.0000 mg | Freq: Four times a day (QID) | INTRAMUSCULAR | Status: DC | PRN

## 2013-05-07 MED ORDER — CITALOPRAM HYDROBROMIDE 20 MG PO TABS
20.0000 mg | ORAL_TABLET | Freq: Every day | ORAL | Status: DC
  Administered 2013-05-08: 20 mg via ORAL
  Filled 2013-05-07: qty 1

## 2013-05-07 MED ORDER — ACETAMINOPHEN 325 MG PO TABS: 650.0000 mg | ORAL_TABLET | Freq: Four times a day (QID) | ORAL | Status: DC | PRN

## 2013-05-07 NOTE — H&P (Signed)
[facility] History and Physical  Eduardo Lee ZOX:[id number] DOB: 26-Jul-1926 DOA: 05/07/2013  Referring physician:  PCP: [name], MD  Specialists:   Chief Complaint: left leg pain  HPI: Eduardo Lee is a very pleasant 80 y.o. male hx DM, HTN, DVT/PE, recent right hip fx and surgery presents to [facility] from PCP office with cc left leg pain. Information obtained from patient and wife who is with pt. He fell 03/13/13 fx hip had surgery 4/13 and went home 4/16. Soon after getting home wife states left leg began to swell. She brought this to attention "of everybody and no one would listen". She states the swelling was intermittent and positional initially and over last several days has been constant and extending to thigh. Associated symptoms include pain in left posterior knee and ankle. Pain constant, sharp, worse with weight bearing. Pain began 3 days ago. Pt denies chest pain, sob, palpitation. Denies headache, dizziness, abdominal pain, n/v/diarrhea. Denies anorexia. Has had some weight loss surgery but is gaining it back. Pt had doppler today that yields left DVT extending popiteal vein upper superficial femoral. [facility] asked to admit. Symptoms came on gradually have persisted and worsened.    Review of Systems: all systems reviewed and are negative except as noted in HPI    Past Medical History  Diagnosis Date  . Gastroparesis   . Diabetes mellitus   . Weight loss   . Pulmonary embolism 12/2010  . DVT (deep venous thrombosis)   . Stroke   . Neuropathy   . Hypertension   . Osteoporosis    Past Surgical History  Procedure Laterality Date  . Carpal tunnel release  08/2011    Left hand  . Ankle surgery      Patient states that he had pins place in the left foot  . Back surgery  2008  . Colonoscopy  01/2011  . Upper gastrointestinal endoscopy  01/2011  . Orif hip fracture  10/31/2011    Procedure: OPEN REDUCTION INTERNAL FIXATION HIP;  Surgeon: Fuller Canada, MD;  Location:  [facility];  Service: Orthopedics;  Laterality: Right;  with Gamma Nail  . Transurethral resection of prostate  1984   Social History:  reports that he quit smoking about 31 years ago. His smoking use included Cigarettes. He smoked 0.00 packs per day. He has never used smokeless tobacco. He reports that  drinks alcohol. He reports that he does not use illicit drugs. Married lives with wife and is independent with ADL's  No Known Allergies  Family History  Problem Relation Age of Onset  . Diabetes Mother   . Diabetes Father   . Diabetes Sister   . Diabetes Brother   . Diabetes Sister   . Diabetes Son   . Healthy Son   . Healthy Son      Prior to Admission medications   Medication Sig Start Date End Date Taking? Authorizing Provider  aspirin 81 MG tablet Take 81 mg by mouth daily.    Historical Provider, MD  citalopram (CELEXA) 20 MG tablet Take 20 mg by mouth daily.      Historical Provider, MD  HUMALOG KWIKPEN 100 UNIT/ML SOPN Inject 5-6 units 3 times daily 03/22/13   Historical Provider, MD  insulin glargine (LANTUS) 100 UNIT/ML injection Inject 10 Units into the skin at bedtime.      Historical Provider, MD  latanoprost (XALATAN) 0.005 % ophthalmic solution Place 1 drop into both eyes daily.      Historical Provider,  MD  UNIFINE PENTIPS 31G X 8 MM MISC USE 3 TIMES A DAY 04/21/13   Merlyn Albert, MD  vitamin B-12 (CYANOCOBALAMIN) 1000 MCG tablet Take 1,000 mcg by mouth daily.      Historical Provider, MD  Vitamin D, Ergocalciferol, (DRISDOL) 50000 UNITS CAPS Take 50,000 Units by mouth every 7 (seven) days.     Historical Provider, MD  ZETIA 10 MG tablet TAKE ONE TABLET BY MOUTH EVERY NIGHT AT BEDTIME 05/06/13   Merlyn Albert, MD   Physical Exam: There were no vitals filed for this visit.   General:  Thin NAD  Eyes: PERRL, EOMI  ENT: ears clear nose without drainage, oropharynx without exudate  Neck: supple full rom  Cardiovascular: RRR No m/g/r   Respiratory: normal  effort BS clear bilaterally no wheeze no rhonchi  Abdomen: flat soft   Skin: warm dry no rash no lesion  Musculoskeletal: no clubbing no cyanosis. Left lower leg slight erythema and shiny, tight skin.   Psychiatric: calm cooperative  Neurologic: cranial nerve II-XII intact  Labs on Admission:  Basic Metabolic Panel: No results found for this basename: NA, K, CL, CO2, GLUCOSE, BUN, CREATININE, CALCIUM, MG, PHOS,  in the last 168 hours Liver Function Tests: No results found for this basename: AST, ALT, ALKPHOS, BILITOT, PROT, ALBUMIN,  in the last 168 hours No results found for this basename: LIPASE, AMYLASE,  in the last 168 hours No results found for this basename: AMMONIA,  in the last 168 hours CBC: No results found for this basename: WBC, NEUTROABS, HGB, HCT, MCV, PLT,  in the last 168 hours Cardiac Enzymes: No results found for this basename: CKTOTAL, CKMB, CKMBINDEX, TROPONINI,  in the last 168 hours  BNP (last 3 results) No results found for this basename: PROBNP,  in the last 8760 hours CBG: No results found for this basename: GLUCAP,  in the last 168 hours  Radiological Exams on Admission: US Venous Img Lower Unilateral Left  05/07/2013   *RADIOLOGY REPORT*  Clinical Data: Left leg pain  LEFT LOWER EXTREMITY VENOUS DUPLEX ULTRASOUND  Technique:  Gray-scale sonography with graded compression, as well as color Doppler and duplex ultrasound, were performed to evaluate the deep venous system of the lower extremity from the level of the common femoral vein through the popliteal and proximal calf veins. Spectral Doppler was utilized to evaluate flow at rest and with distal augmentation maneuvers.  Comparison:  None.  Findings: Intraluminal thrombus extending from the popliteal vein to the upper superficial femoral vein.  The CFV remains patent.  Subcutaneous edema in the calf.  IMPRESSION: Deep venous thrombosis extending from the popliteal vein the upper superficial femoral vein.   Critical Value/emergent results were called by telephone at the time of interpretation on 05/07/2013 at 1544 hours to Dr. Gerda Diss, who verbally acknowledged these results.   Original Report Authenticated By: Charline Bills, M.D.    EKG: Independently reviewed.   Assessment/Plan Principal Problem:   Left leg DVT: hx of same 2012, not on anticoagulation, recent hip surgery. Will admit. Will request coumadin per pharmacy and lovenox per pharmacy as well.  Active Problems:   Diabetes type 2, uncontrolled: recent A1c 7.3. Will continue home lantus and use SSI for glycemic control    Diabetic gastroparesis:hx of . No issues of late. Appetite good. Some weight loss since surgery but is gaining it back.     Intertrochanteric fracture of right femur: S/p in April. Will request PT. Stable.  Hypertension: not on any antihypertensive medication. monitor      Code Status: full Family Communication: wife Disposition Plan: home when ready  Time spent: 60 minutes  [facility] M [facility]   If 7PM-7AM, please contact night-coverage [email or web address] Password [facility] 05/07/2013, 4:33 PM

## 2013-05-07 NOTE — H&P (Signed)
Patient seen, examined and discussed with my nurse practitioner. Agree with above. Stable. Will try to set up tomorrow with case management Lovenox plus Coumadin and then send home.

## 2013-05-07 NOTE — Progress Notes (Signed)
ANTICOAGULATION CONSULT NOTE  Pharmacy Consult for Lovenox and Warfarin Indication: pulmonary embolus and DVT, Recurrent DVT this admission  No Known Allergies  Patient Measurements: Height: 5\' 7"  (170.2 cm) Weight: 141 lb 8 oz (64.184 kg) IBW/kg (Calculated) : 66.1  Vital Signs: Temp: 97.4 F (36.3 C) (06/06 1656) Temp src: Oral (06/06 1656) BP: 167/79 mmHg (06/06 1656) Pulse Rate: 79 (06/06 1656)  Labs:  Recent Labs  05/07/13 1707  LABPROT 13.2  INR 1.01  CREATININE 1.05    Estimated Creatinine Clearance: 46.7 ml/min (by C-G formula based on Cr of 1.05).   Medical History: Past Medical History  Diagnosis Date  . Gastroparesis   . Diabetes mellitus   . Weight loss   . Pulmonary embolism 12/2010  . DVT (deep venous thrombosis)   . Stroke   . Neuropathy   . Hypertension   . Osteoporosis    Medications:  Prescriptions prior to admission  Medication Sig Dispense Refill  . aspirin 81 MG tablet Take 81 mg by mouth daily.      . citalopram (CELEXA) 20 MG tablet Take 20 mg by mouth daily.        Marland Kitchen HUMALOG KWIKPEN 100 UNIT/ML SOPN Inject 5-6 units 3 times daily      . insulin glargine (LANTUS) 100 UNIT/ML injection Inject 10 Units into the skin at bedtime.        Marland Kitchen latanoprost (XALATAN) 0.005 % ophthalmic solution Place 1 drop into both eyes daily.        Marland Kitchen UNIFINE PENTIPS 31G X 8 MM MISC USE 3 TIMES A DAY  100 each  3  . vitamin B-12 (CYANOCOBALAMIN) 1000 MCG tablet Take 1,000 mcg by mouth daily.        . Vitamin D, Ergocalciferol, (DRISDOL) 50000 UNITS CAPS Take 50,000 Units by mouth every 7 (seven) days.       Marland Kitchen ZETIA 10 MG tablet TAKE ONE TABLET BY MOUTH EVERY NIGHT AT BEDTIME  90 tablet  1    Assessment: Okay for Protocol, recent ortho surgery.  Hx DVT/PE.  Goal of Therapy:  INR 2-3 Monitor platelets by anticoagulation protocol: Yes   Plan:  Lovenox 1mg /kg SQ every 12 hours. CBC 3 X week. Warfarin 5mg  PO x 1. Daily PT/INR. Educate.  Mady Gemma 05/07/2013,6:54 PM

## 2013-05-07 NOTE — Progress Notes (Signed)
  Subjective:    Patient ID: Eduardo Lee, male    DOB: 1926/03/12, 79 y.o.   MRN: [id number]  HPI Energy level. So so. Trying to eat well.  Results for orders placed in visit on 05/07/13  POCT GLYCOSYLATED HEMOGLOBIN (HGB A1C)      Result Value Range   Hemoglobin A1C 7.3     Patient's main concern however his more acute. A little over a month ago he suffered a left hip fracture[place] surgically [place]. He stated the hospital for 4 days. He has been receiving home health therapy since. Patient has had progressive swelling of the left leg. Family was concerned about this. Orthotopic her is and nurses [place] the beach reassured him that it was just postinflammatory and postsurgical changes.  Patient has history of DVTs. History of pulmonary emboli. Both times have occurred after accidents and/or surgery.  No current chest pain or shortness of breath. Just diffuse weakness since the surgery.    Review of Systems Otherwise negative.    Objective:   Physical Exam  Alert no acute distress. HEENT normal. Lungs clear no tachypnea. Heart regular rate and rhythm. Left leg significant edema present. Positive calf tenderness noted. Surgical incision noted.      Assessment & Plan:  Impression left leg swelling with very significant history of DVTs. Postsurgical. Need for urgent workup discussed. Plan addendum ultrasound came back positive for DVT extending to near the groin. I spoke with patient. I spoke with radiologist. I spoke with hospitalist. I spoke with patient's girlfriend. 35 minutes spent easily on this case today most in discussion. Patient So he needs to be in the hospital. He understands that. Hospitalist understand that. We have arranged a direct admit to bypass the ER. WSL

## 2013-05-08 DIAGNOSIS — I824Y9 Acute embolism and thrombosis of unspecified deep veins of unspecified proximal lower extremity: Secondary | ICD-10-CM | POA: Diagnosis not present

## 2013-05-08 DIAGNOSIS — Z86718 Personal history of other venous thrombosis and embolism: Secondary | ICD-10-CM | POA: Diagnosis not present

## 2013-05-08 DIAGNOSIS — E1149 Type 2 diabetes mellitus with other diabetic neurological complication: Secondary | ICD-10-CM | POA: Diagnosis not present

## 2013-05-08 DIAGNOSIS — I10 Essential (primary) hypertension: Secondary | ICD-10-CM | POA: Diagnosis not present

## 2013-05-08 DIAGNOSIS — K3184 Gastroparesis: Secondary | ICD-10-CM | POA: Diagnosis not present

## 2013-05-08 DIAGNOSIS — S72009S Fracture of unspecified part of neck of unspecified femur, sequela: Secondary | ICD-10-CM | POA: Diagnosis not present

## 2013-05-08 DIAGNOSIS — S72009D Fracture of unspecified part of neck of unspecified femur, subsequent encounter for closed fracture with routine healing: Secondary | ICD-10-CM | POA: Diagnosis not present

## 2013-05-08 DIAGNOSIS — I82409 Acute embolism and thrombosis of unspecified deep veins of unspecified lower extremity: Secondary | ICD-10-CM | POA: Diagnosis not present

## 2013-05-08 LAB — BASIC METABOLIC PANEL
BUN: 16 mg/dL (ref 6–23)
Chloride: 102 mEq/L (ref 96–112)
Creatinine, Ser: 1.11 mg/dL (ref 0.50–1.35)
GFR calc non Af Amer: 59 mL/min — ABNORMAL LOW (ref >90)
Glucose, Bld: 158 mg/dL — ABNORMAL HIGH (ref 70–99)
Potassium: 4.6 mEq/L (ref 3.5–5.1)

## 2013-05-08 LAB — CBC
Hemoglobin: 11.1 g/dL — ABNORMAL LOW (ref 13.0–17.0)
Platelets: 246 10*3/uL (ref 150–400)
RBC: 3.64 MIL/uL — ABNORMAL LOW (ref 4.22–5.81)
WBC: 4.5 10*3/uL (ref 4.0–10.5)

## 2013-05-08 LAB — GLUCOSE, CAPILLARY
Glucose-Capillary: 136 mg/dL — ABNORMAL HIGH (ref 70–99)
Glucose-Capillary: 221 mg/dL — ABNORMAL HIGH (ref 70–99)

## 2013-05-08 MED ORDER — WARFARIN SODIUM 4 MG PO TABS: 6.0000 mg | ORAL_TABLET | Freq: Every day | ORAL | Status: DC

## 2013-05-08 MED ORDER — ENOXAPARIN SODIUM 80 MG/0.8ML ~~LOC~~ SOLN: 1.0000 mg/kg | Freq: Two times a day (BID) | SUBCUTANEOUS | Status: DC

## 2013-05-08 NOTE — Progress Notes (Signed)
ANTICOAGULATION CONSULT NOTE  Pharmacy Consult for Lovenox and Warfarin Indication: pulmonary embolus and DVT, Recurrent DVT this admission  No Known Allergies  Patient Measurements: Height: 5\' 7"  (170.2 cm) Weight: 141 lb 8 oz (64.184 kg) IBW/kg (Calculated) : 66.1  Vital Signs: Temp: 97.8 F (36.6 C) (06/07 0608) Temp src: Oral (06/07 0608) BP: 142/67 mmHg (06/07 0608) Pulse Rate: 71 (06/07 0608)  Labs:  Recent Labs  05/07/13 1707 05/08/13 0641  HGB  --  11.1*  HCT  --  33.2*  PLT  --  246  LABPROT 13.2 13.9  INR 1.01 1.08  CREATININE 1.05 1.11    Estimated Creatinine Clearance: 44.2 ml/min (by C-G formula based on Cr of 1.11).   Medical History: Past Medical History  Diagnosis Date  . Gastroparesis   . Diabetes mellitus   . Weight loss   . Pulmonary embolism 12/2010  . DVT (deep venous thrombosis)   . Stroke   . Neuropathy   . Hypertension   . Osteoporosis    Medications:  Prescriptions prior to admission  Medication Sig Dispense Refill  . aspirin 81 MG tablet Take 81 mg by mouth daily.      . citalopram (CELEXA) 20 MG tablet Take 20 mg by mouth daily.        Marland Kitchen HUMALOG KWIKPEN 100 UNIT/ML SOPN Inject 5-6 units 3 times daily      . insulin glargine (LANTUS) 100 UNIT/ML injection Inject 10 Units into the skin at bedtime.        Marland Kitchen latanoprost (XALATAN) 0.005 % ophthalmic solution Place 1 drop into both eyes daily.        Marland Kitchen UNIFINE PENTIPS 31G X 8 MM MISC USE 3 TIMES A DAY  100 each  3  . vitamin B-12 (CYANOCOBALAMIN) 1000 MCG tablet Take 1,000 mcg by mouth daily.        . Vitamin D, Ergocalciferol, (DRISDOL) 50000 UNITS CAPS Take 50,000 Units by mouth every 7 (seven) days.       Marland Kitchen ZETIA 10 MG tablet TAKE ONE TABLET BY MOUTH EVERY NIGHT AT BEDTIME  90 tablet  1    Assessment: Okay for Protocol, recent ortho surgery.  Hx DVT/PE.  Goal of Therapy:  INR 2-3 Monitor platelets by anticoagulation protocol: Yes   Plan: Continue Lovenox 1mg /kg SQ every 12  hours. CBC 3 X week. Repeat Warfarin 5mg  PO x 1. Daily PT/INR. Educate.  Eduardo Lee 05/08/2013,9:08 AM

## 2013-05-08 NOTE — Progress Notes (Signed)
Patient and wife state understanding of discharge instructions 

## 2013-05-08 NOTE — Discharge Summary (Signed)
Physician Discharge Summary  Eduardo Lee ZOX:[id number] DOB: 1926/05/30 DOA: 05/07/2013  PCP: [name], MD  Admit date: 05/07/2013 Discharge date: 05/08/2013  Time spent: 25 minutes  Recommendations for Outpatient Follow-up:  1. Patient will have a repeat INR check on 6/9 in Dr.Luking office in the morning. 2. Patient is being discharged on 65 mg of Lovenox twice a day with next dose due on 6/7 evening. He will continue this medication at the very least until the evening of 6/10.  (five-day bridge) and until his INR is therapeutic. 3. He has been started on Coumadin 6 mg by mouth each bedtime. INR check on Monday and adjust this dose.  Discharge Diagnoses:  Principal Problem:   Left leg DVT Active Problems:   Diabetes type 2, uncontrolled   Diabetic gastroparesis   Intertrochanteric fracture of right femur   Hypertension   Discharge Condition: Improved, being discharged home  Diet recommendation: Carb modified low sodium  Filed Weights   05/07/13 1851  Weight: 64.184 kg (141 lb 8 oz)    History of present illness:  On 6/7:Eduardo Lee is a very pleasant 79 y.o. male hx DM, HTN, DVT/PE, recent right hip fx and surgery presents to [facility] from PCP office with cc left leg pain. Information obtained from patient and wife who is with pt. He fell 03/13/13 fx hip had surgery 4/13 and went home 4/16. Soon after getting home wife states left leg began to swell. She brought this to attention "of everybody and no one would listen". She states the swelling was intermittent and positional initially and over last several days has been constant and extending to thigh. Associated symptoms include pain in left posterior knee and ankle. Pain constant, sharp, worse with weight bearing. Pain began 3 days ago. Pt denies chest pain, sob, palpitation. Denies headache, dizziness, abdominal pain, n/v/diarrhea. Denies anorexia. Has had some weight loss surgery but is gaining it back. Pt had doppler today that  yields left DVT extending popiteal vein upper superficial femoral. TRH asked to admit. Symptoms came on gradually have persisted and worsened.    Hospital Course:  Principal Problem:   Left leg DVT: Patient had recent hip surgery. Stated he did not notice much in terms of pain he says his hip with a primary pain focus. As this started to ease off he noted that his left leg had persistent swelling and discomfort and neck with his PCP. There is concerns for DVT and so patient went to ultrasound on 6/6 and indeed an extensive DVT was confirmed. Patient had a previous history of DVT secondary to post surgery. He is familiar with Lovenox and Coumadin. Patient was started on Lovenox, dose adjusted per weight at 65 mg every 12 hours. In discussion with his pharmacist, the patient previously had been on 6 mg daily for 6 days a week and 4 mg on the remaining day. He was given a dose of 5 mg of Coumadin on 6/6 evening and in discussion with the patient, he's otherwise feeling better and his wife is comfortable giving Lovenox and familiar with the process. Arrangements were made and patient was discharged on 6/7 with plans to continue Lovenox and Coumadin with a repeat INR on 6/9 morning at his primary care physician Dr.Luking's office.  Active Problems:   Diabetes type 2, uncontrolled: Stable. CBGs were covered with sliding scale and home dose of insulin. A1c was noted to be 7.3   Diabetic gastroparesis: Stable medical issue during his hospitalization  Intertrochanteric fracture of right femur: Stable. Patient is currently Physical therapy. I advised him he is safe to resume physical therapy as long as he is very careful about falls   Hypertension: Stable. Continue home meds   Procedures:  None  Consultations:  None  Discharge Exam: Filed Vitals:   05/07/13 1926 05/07/13 2237 05/08/13 0608 05/08/13 1031  BP:  137/70 142/67 104/63  Pulse: 75 71 71 83  Temp:  98.3 F (36.8 C) 97.8 F (36.6 C) 97.7  F (36.5 C)  TempSrc:  Oral Oral Oral  Resp: 20 20 20 20   Height:      Weight:      SpO2: 97% 97% 96% 96%    General: Alert and oriented x3, no acute distress Cardiovascular: Regular rate and rhythm, S1-S2 Respiratory: Clear to auscultation bilaterally Abdomen: Soft, nontender, nondistended, positive bowel sounds Extremities: No clubbing or cyanosis or edema  Discharge Instructions  Discharge Orders   Future Orders Complete By Expires     Diet - low sodium heart healthy  As directed     Increase activity slowly  As directed         Medication List    TAKE these medications       aspirin 81 MG tablet  Take 81 mg by mouth daily.     citalopram 20 MG tablet  Commonly known as:  CELEXA  Take 20 mg by mouth daily.     enoxaparin 80 MG/0.8ML injection  Commonly known as:  LOVENOX  Inject 0.65 mLs (65 mg total) into the skin every 12 (twelve) hours.     HUMALOG KWIKPEN 100 unit/mL Soln  Generic drug:  insulin lispro  Inject 5-6 units 3 times daily     LANTUS 100 UNIT/ML injection  Generic drug:  insulin glargine  Inject 10 Units into the skin at bedtime.     latanoprost 0.005 % ophthalmic solution  Commonly known as:  XALATAN  Place 1 drop into both eyes daily.     UNIFINE PENTIPS 31G X 8 MM Misc  Generic drug:  Insulin Pen Needle  USE 3 TIMES A DAY     vitamin B-12 1000 MCG tablet  Commonly known as:  CYANOCOBALAMIN  Take 1,000 mcg by mouth daily.     Vitamin D (Ergocalciferol) 50000 UNITS Caps  Commonly known as:  DRISDOL  Take 50,000 Units by mouth every 7 (seven) days.     warfarin 4 MG tablet  Commonly known as:  COUMADIN  Take 1.5 tablets (6 mg total) by mouth daily.     ZETIA 10 MG tablet  Generic drug:  ezetimibe  TAKE ONE TABLET BY MOUTH EVERY NIGHT AT BEDTIME       No Known Allergies     Follow-up Information   Follow up with [name], MD On 05/10/2013. (INR check)    Contact information:   [address] [place]  [address] [phone]        The results of significant diagnostics from this hospitalization (including imaging, microbiology, ancillary and laboratory) are listed below for reference.    Significant Diagnostic Studies: US Venous Img Lower Unilateral Left  05/07/2013    IMPRESSION: Deep venous thrombosis extending from the popliteal vein the upper superficial femoral vein.  Critical Value/emergent results were called by telephone at the time of interpretation on 05/07/2013 at 1544 hours to Dr. Gerda Diss, who verbally acknowledged these results.   Original Report Authenticated By: Charline Bills, M.D.  Labs: Basic Metabolic Panel:  Recent Labs Lab 05/07/13 1707 05/08/13 0641  NA 139 136  K 4.3 4.6  CL 101 102  CO2 28 31  GLUCOSE 217* 158*  BUN 18 16  CREATININE 1.05 1.11  CALCIUM 9.1 8.6   CBC:  Recent Labs Lab 05/08/13 0641  WBC 4.5  HGB 11.1*  HCT 33.2*  MCV 91.2  PLT 246   CBG:  Recent Labs Lab 05/07/13 1716 05/07/13 2028 05/08/13 0755 05/08/13 1143  GLUCAP 183* 212* 136* 221*       Signed:  Izella Ybanez K  [facility] 05/08/2013, 1:52 PM

## 2013-05-09 DIAGNOSIS — I82409 Acute embolism and thrombosis of unspecified deep veins of unspecified lower extremity: Secondary | ICD-10-CM | POA: Insufficient documentation

## 2013-05-10 ENCOUNTER — Telehealth: Payer: Self-pay | Admitting: Family Medicine

## 2013-05-10 ENCOUNTER — Other Ambulatory Visit: Payer: Self-pay | Admitting: *Deleted

## 2013-05-10 DIAGNOSIS — Z7901 Long term (current) use of anticoagulants: Secondary | ICD-10-CM

## 2013-05-10 DIAGNOSIS — Z5181 Encounter for therapeutic drug level monitoring: Secondary | ICD-10-CM | POA: Diagnosis not present

## 2013-05-10 LAB — PROTIME-INR: INR: 1.64 — ABNORMAL HIGH (ref <1.50)

## 2013-05-10 NOTE — Telephone Encounter (Signed)
Patient needs to have INR checked today and everyday this week, as suggested by Dr. Brett Canales. He needs the BW paperwork drawn up to do this today.

## 2013-05-10 NOTE — Telephone Encounter (Signed)
INR order Standing and STAT sent to lab. Pt notified and he made appointment with Dr. Brett Canales this week.

## 2013-05-10 NOTE — Telephone Encounter (Signed)
Let's do daily plus ov on wed or thur. Put in stat area and get # so we can notify.

## 2013-05-11 DIAGNOSIS — Z5181 Encounter for therapeutic drug level monitoring: Secondary | ICD-10-CM | POA: Diagnosis not present

## 2013-05-11 DIAGNOSIS — Z7901 Long term (current) use of anticoagulants: Secondary | ICD-10-CM | POA: Diagnosis not present

## 2013-05-11 LAB — PROTIME-INR: Prothrombin Time: 20.1 seconds — ABNORMAL HIGH (ref 11.6–15.2)

## 2013-05-12 ENCOUNTER — Other Ambulatory Visit: Payer: Self-pay | Admitting: *Deleted

## 2013-05-12 ENCOUNTER — Encounter: Payer: Self-pay | Admitting: Family Medicine

## 2013-05-12 ENCOUNTER — Ambulatory Visit (INDEPENDENT_AMBULATORY_CARE_PROVIDER_SITE_OTHER): Payer: Medicare Other | Admitting: Family Medicine

## 2013-05-12 VITALS — BP 92/60 | Temp 97.9°F | Ht 68.0 in | Wt 139.2 lb

## 2013-05-12 DIAGNOSIS — I82402 Acute embolism and thrombosis of unspecified deep veins of left lower extremity: Secondary | ICD-10-CM

## 2013-05-12 DIAGNOSIS — I82409 Acute embolism and thrombosis of unspecified deep veins of unspecified lower extremity: Secondary | ICD-10-CM

## 2013-05-12 DIAGNOSIS — Z5181 Encounter for therapeutic drug level monitoring: Secondary | ICD-10-CM | POA: Diagnosis not present

## 2013-05-12 DIAGNOSIS — Z7901 Long term (current) use of anticoagulants: Secondary | ICD-10-CM

## 2013-05-12 LAB — PROTIME-INR: INR: 2.14 — ABNORMAL HIGH (ref <1.50)

## 2013-05-12 MED ORDER — ENOXAPARIN SODIUM 80 MG/0.8ML ~~LOC~~ SOLN: 1.0000 mg/kg | Freq: Two times a day (BID) | SUBCUTANEOUS | Status: DC

## 2013-05-13 ENCOUNTER — Other Ambulatory Visit: Payer: Self-pay | Admitting: *Deleted

## 2013-05-13 DIAGNOSIS — Z7901 Long term (current) use of anticoagulants: Secondary | ICD-10-CM

## 2013-05-13 DIAGNOSIS — Z5181 Encounter for therapeutic drug level monitoring: Secondary | ICD-10-CM | POA: Diagnosis not present

## 2013-05-14 ENCOUNTER — Other Ambulatory Visit: Payer: Self-pay | Admitting: Family Medicine

## 2013-05-14 DIAGNOSIS — Z7901 Long term (current) use of anticoagulants: Secondary | ICD-10-CM | POA: Diagnosis not present

## 2013-05-14 LAB — PROTIME-INR
INR: 2.35 — ABNORMAL HIGH (ref <1.50)
Prothrombin Time: 24.7 seconds — ABNORMAL HIGH (ref 11.6–15.2)

## 2013-05-17 ENCOUNTER — Other Ambulatory Visit: Payer: Self-pay | Admitting: Family Medicine

## 2013-05-17 DIAGNOSIS — Z7901 Long term (current) use of anticoagulants: Secondary | ICD-10-CM | POA: Diagnosis not present

## 2013-05-17 LAB — PROTIME-INR: Prothrombin Time: 28.8 seconds — ABNORMAL HIGH (ref 11.6–15.2)

## 2013-05-20 ENCOUNTER — Other Ambulatory Visit: Payer: Self-pay | Admitting: Family Medicine

## 2013-05-20 DIAGNOSIS — Z7901 Long term (current) use of anticoagulants: Secondary | ICD-10-CM | POA: Diagnosis not present

## 2013-05-20 LAB — PROTIME-INR
INR: 2.86 — ABNORMAL HIGH (ref <1.50)
Prothrombin Time: 28.5 seconds — ABNORMAL HIGH (ref 11.6–15.2)

## 2013-05-24 ENCOUNTER — Encounter: Payer: Self-pay | Admitting: *Deleted

## 2013-05-24 ENCOUNTER — Other Ambulatory Visit: Payer: Self-pay | Admitting: Family Medicine

## 2013-05-24 ENCOUNTER — Telehealth: Payer: Self-pay | Admitting: *Deleted

## 2013-05-24 DIAGNOSIS — Z7901 Long term (current) use of anticoagulants: Secondary | ICD-10-CM | POA: Diagnosis not present

## 2013-05-24 NOTE — Progress Notes (Signed)
One andd a half today, one and a half on thur. One daily all other days. Repeat next mon

## 2013-05-24 NOTE — Telephone Encounter (Signed)
Pt aware to take 1 1/2 tab today and Thursday. 1 qd, recheck INR next monday

## 2013-05-26 ENCOUNTER — Encounter: Payer: Self-pay | Admitting: Family Medicine

## 2013-05-26 ENCOUNTER — Other Ambulatory Visit: Payer: Self-pay | Admitting: *Deleted

## 2013-05-26 ENCOUNTER — Ambulatory Visit (INDEPENDENT_AMBULATORY_CARE_PROVIDER_SITE_OTHER): Payer: Medicare Other | Admitting: Family Medicine

## 2013-05-26 VITALS — BP 110/64 | HR 90 | Wt 137.0 lb

## 2013-05-26 DIAGNOSIS — Z7901 Long term (current) use of anticoagulants: Secondary | ICD-10-CM | POA: Diagnosis not present

## 2013-05-26 DIAGNOSIS — I82402 Acute embolism and thrombosis of unspecified deep veins of left lower extremity: Secondary | ICD-10-CM

## 2013-05-26 DIAGNOSIS — I82409 Acute embolism and thrombosis of unspecified deep veins of unspecified lower extremity: Secondary | ICD-10-CM | POA: Diagnosis not present

## 2013-05-26 MED ORDER — WARFARIN SODIUM 4 MG PO TABS: ORAL_TABLET | ORAL | Status: DC

## 2013-05-26 NOTE — Addendum Note (Signed)
Addended by: Metro Kung on: 05/26/2013 11:46 AM   Modules accepted: Orders

## 2013-05-26 NOTE — Progress Notes (Signed)
  Subjective:    Patient ID: Eduardo Lee, male    DOB: [birth year], 79 y.o.   MRN: [id number]  HPI Patient arrives office for followup. We continue to intensively manage his Coumadin off-site. Please see results of just several days ago. See adjusted numbers. Patient reports less leg discomfort. Still experiencing some swelling. Due [place] go [place] on his head. No chest pain no shortness of breath. Compliant with medications.   Review of Systems Review systems otherwise negative.    Objective:   Physical Exam  Alert no acute distress. Lungs clear. Heart regular in rhythm. Left leg 1+ edema. No significant calf tenderness. Pulses good.  Results for orders placed in visit on 05/24/13  PROTIME-INR      Result Value Range   Prothrombin Time 18.6 (*) 11.6 - 15.2 seconds   INR 1.61 (*) <1.50       Assessment & Plan:  Impression 1 status post DVT. #2 intensive Coumadin management. Plan recheck INR on Friday. Maintain Coumadin as previously discussed. Get compression stockings to start wearing faithfully. Followup in one month. Further recommendations based on results. Higher trochars because of major telephone management offsite. WSL

## 2013-05-27 ENCOUNTER — Other Ambulatory Visit: Payer: Self-pay | Admitting: Family Medicine

## 2013-05-28 ENCOUNTER — Telehealth: Payer: Self-pay | Admitting: *Deleted

## 2013-05-28 ENCOUNTER — Other Ambulatory Visit: Payer: Self-pay | Admitting: Family Medicine

## 2013-05-28 DIAGNOSIS — Z7901 Long term (current) use of anticoagulants: Secondary | ICD-10-CM | POA: Diagnosis not present

## 2013-05-28 LAB — PROTIME-INR
INR: 2.51 — ABNORMAL HIGH (ref <1.50)
Prothrombin Time: 26.1 seconds — ABNORMAL HIGH (ref 11.6–15.2)

## 2013-05-30 NOTE — Progress Notes (Signed)
  Subjective:    Patient ID: Eduardo Lee, male    DOB: [birth year], 79 y.o.   MRN: [id number]  HPI Patient arrives office for followup from the hospital. Please see prior notes. We saw him urgently with leg pain a week ago. He also had leg swelling. He is status post hip surgery. We ordered urgent test. This revealed a clot all the way to left groin. Patient was admitted to the hospital. He was started on Lovenox and Coumadin. He tolerated this well.  All emergency room and hospital records are reviewed at great length.  This represents the patient's third DVT. Even no all 3 have occurred 2 to identify will risk factors, this now commits the patient to long-term anticoagulation discussed with family.   Review of Systems No chest pain or shortness breath no domino pain ROS otherwise negative    Objective:   Physical Exam  Alert no acute distress. Lungs clear. Heart regular in rhythm. H&T normal. Leg edema persists. Positive calf tenderness. Some knee crepitation. Some limitation of motion the hip.      Assessment & Plan:  Impression DVT significant with extension up to groin. #2 lifelong anticoagulation need discussed. #3 hospital records all reviewed. #4 anticoagulation Coumadin reviewed. Plan regular INRs further management as noted. Patient seen as a face-to-face visit within 7 days. Also high complexity. And high management.

## 2013-05-31 DIAGNOSIS — S72143A Displaced intertrochanteric fracture of unspecified femur, initial encounter for closed fracture: Secondary | ICD-10-CM | POA: Diagnosis not present

## 2013-05-31 DIAGNOSIS — M79609 Pain in unspecified limb: Secondary | ICD-10-CM | POA: Diagnosis not present

## 2013-05-31 DIAGNOSIS — IMO0002 Reserved for concepts with insufficient information to code with codable children: Secondary | ICD-10-CM | POA: Diagnosis not present

## 2013-06-07 ENCOUNTER — Other Ambulatory Visit: Payer: Self-pay | Admitting: Family Medicine

## 2013-06-07 DIAGNOSIS — Z7901 Long term (current) use of anticoagulants: Secondary | ICD-10-CM | POA: Diagnosis not present

## 2013-06-07 LAB — PROTIME-INR: INR: 1.89 — ABNORMAL HIGH (ref <1.50)

## 2013-06-08 ENCOUNTER — Telehealth: Payer: Self-pay | Admitting: *Deleted

## 2013-06-08 DIAGNOSIS — E11339 Type 2 diabetes mellitus with moderate nonproliferative diabetic retinopathy without macular edema: Secondary | ICD-10-CM | POA: Diagnosis not present

## 2013-06-08 DIAGNOSIS — E1139 Type 2 diabetes mellitus with other diabetic ophthalmic complication: Secondary | ICD-10-CM | POA: Diagnosis not present

## 2013-06-08 DIAGNOSIS — H353190 Nonexudative age-related macular degeneration, unspecified eye, stage unspecified: Secondary | ICD-10-CM | POA: Diagnosis not present

## 2013-06-08 NOTE — Telephone Encounter (Signed)
inr yesterday 1.89. He is taking 4 mg tablets. 1.5 tablets on mon and thurs and one tablet all other days. Does he need to change and when to recheck

## 2013-06-08 NOTE — Telephone Encounter (Signed)
Consult with Dr. Lorin Picket change to 1.5 tablets on mon, thurs, and sat and one tablet all other days recheck again in 7-14 days

## 2013-06-22 ENCOUNTER — Other Ambulatory Visit: Payer: Self-pay | Admitting: Family Medicine

## 2013-06-22 DIAGNOSIS — Z7901 Long term (current) use of anticoagulants: Secondary | ICD-10-CM | POA: Diagnosis not present

## 2013-06-22 LAB — PROTIME-INR
INR: 2.09 — ABNORMAL HIGH (ref <1.50)
Prothrombin Time: 22.8 seconds — ABNORMAL HIGH (ref 11.6–15.2)

## 2013-06-23 NOTE — Progress Notes (Signed)
Notified patient cst re ck one mo. Patient will schedule appointment on Friday at office visit.

## 2013-06-25 ENCOUNTER — Encounter: Payer: Self-pay | Admitting: Family Medicine

## 2013-06-25 ENCOUNTER — Ambulatory Visit (INDEPENDENT_AMBULATORY_CARE_PROVIDER_SITE_OTHER): Payer: Medicare Other | Admitting: Family Medicine

## 2013-06-25 VITALS — BP 118/72 | HR 80 | Wt 144.0 lb

## 2013-06-25 DIAGNOSIS — I82409 Acute embolism and thrombosis of unspecified deep veins of unspecified lower extremity: Secondary | ICD-10-CM | POA: Diagnosis not present

## 2013-06-25 DIAGNOSIS — I82402 Acute embolism and thrombosis of unspecified deep veins of left lower extremity: Secondary | ICD-10-CM

## 2013-06-25 DIAGNOSIS — I10 Essential (primary) hypertension: Secondary | ICD-10-CM | POA: Diagnosis not present

## 2013-06-25 DIAGNOSIS — E1165 Type 2 diabetes mellitus with hyperglycemia: Secondary | ICD-10-CM

## 2013-06-25 DIAGNOSIS — IMO0001 Reserved for inherently not codable concepts without codable children: Secondary | ICD-10-CM

## 2013-07-27 ENCOUNTER — Ambulatory Visit (INDEPENDENT_AMBULATORY_CARE_PROVIDER_SITE_OTHER): Payer: Medicare Other | Admitting: *Deleted

## 2013-07-27 DIAGNOSIS — Z7901 Long term (current) use of anticoagulants: Secondary | ICD-10-CM | POA: Diagnosis not present

## 2013-08-11 DIAGNOSIS — E559 Vitamin D deficiency, unspecified: Secondary | ICD-10-CM | POA: Diagnosis not present

## 2013-08-11 DIAGNOSIS — E1029 Type 1 diabetes mellitus with other diabetic kidney complication: Secondary | ICD-10-CM | POA: Diagnosis not present

## 2013-08-11 DIAGNOSIS — E785 Hyperlipidemia, unspecified: Secondary | ICD-10-CM | POA: Diagnosis not present

## 2013-08-27 ENCOUNTER — Ambulatory Visit: Payer: Medicare Other

## 2013-08-31 ENCOUNTER — Ambulatory Visit (INDEPENDENT_AMBULATORY_CARE_PROVIDER_SITE_OTHER): Payer: Medicare Other | Admitting: *Deleted

## 2013-08-31 DIAGNOSIS — Z23 Encounter for immunization: Secondary | ICD-10-CM | POA: Diagnosis not present

## 2013-08-31 DIAGNOSIS — Z7901 Long term (current) use of anticoagulants: Secondary | ICD-10-CM

## 2013-08-31 LAB — POCT INR: INR: 1.6

## 2013-09-01 ENCOUNTER — Other Ambulatory Visit: Payer: Self-pay | Admitting: Family Medicine

## 2013-09-06 DIAGNOSIS — E1142 Type 2 diabetes mellitus with diabetic polyneuropathy: Secondary | ICD-10-CM | POA: Diagnosis not present

## 2013-09-06 DIAGNOSIS — E1029 Type 1 diabetes mellitus with other diabetic kidney complication: Secondary | ICD-10-CM | POA: Diagnosis not present

## 2013-09-06 DIAGNOSIS — E11359 Type 2 diabetes mellitus with proliferative diabetic retinopathy without macular edema: Secondary | ICD-10-CM | POA: Diagnosis not present

## 2013-09-06 DIAGNOSIS — E1049 Type 1 diabetes mellitus with other diabetic neurological complication: Secondary | ICD-10-CM | POA: Diagnosis not present

## 2013-09-06 DIAGNOSIS — E785 Hyperlipidemia, unspecified: Secondary | ICD-10-CM | POA: Diagnosis not present

## 2013-09-06 DIAGNOSIS — E1039 Type 1 diabetes mellitus with other diabetic ophthalmic complication: Secondary | ICD-10-CM | POA: Diagnosis not present

## 2013-09-06 DIAGNOSIS — I10 Essential (primary) hypertension: Secondary | ICD-10-CM | POA: Diagnosis not present

## 2013-09-16 NOTE — Progress Notes (Signed)
  Subjective:    Patient ID: Eduardo Lee, male    DOB: 12-26-1925, 79 y.o.   MRN: [id number]  HPI Patient arrives office for followup of his many concerns. He is compliant with his anticoagulant. No obvious side effects from it. No recurrent swelling of legs. History of recurrent pulmonary emboli and DVTs. Patient reports understanding importance of taking his medications.  Patient claims compliance with his diabetes regimen. Followed by diabetes specialist in this regard.  Patient reports his depression is stable. Handling the medicine well. No obvious side effects.  Patient reports ongoing improved discomfort left leg. Less tenderness less swelling.   Review of Systems No chest pain no back pain no headache no shortness of breath ROS otherwise negative    Objective:   Physical Exam  Alert no apparent distress. HEENT normal. Vitals good. Lungs clear. Heart regular in rhythm. Abdomen benign. Left leg trace edema. Negative Homans sign.      Assessment & Plan:  Impression #1 resulting DVT. #2 lifelong anticoagulation. #3 hypertension good control on diet alone. #4 diabetes plan maintain same meds. Diet exercise discussed. 25 minutes spent most in discussion. Check INR monthly. WSL

## 2013-09-27 ENCOUNTER — Ambulatory Visit: Payer: Medicare Other | Admitting: Family Medicine

## 2013-09-27 ENCOUNTER — Encounter: Payer: Self-pay | Admitting: Family Medicine

## 2013-09-27 ENCOUNTER — Ambulatory Visit (INDEPENDENT_AMBULATORY_CARE_PROVIDER_SITE_OTHER): Payer: Medicare Other | Admitting: Family Medicine

## 2013-09-27 VITALS — BP 120/72 | Ht 68.0 in | Wt 148.0 lb

## 2013-09-27 DIAGNOSIS — Z7901 Long term (current) use of anticoagulants: Secondary | ICD-10-CM | POA: Diagnosis not present

## 2013-09-27 DIAGNOSIS — I10 Essential (primary) hypertension: Secondary | ICD-10-CM

## 2013-09-27 DIAGNOSIS — E119 Type 2 diabetes mellitus without complications: Secondary | ICD-10-CM | POA: Diagnosis not present

## 2013-09-27 DIAGNOSIS — E1165 Type 2 diabetes mellitus with hyperglycemia: Secondary | ICD-10-CM

## 2013-09-27 MED ORDER — WARFARIN SODIUM 4 MG PO TABS: ORAL_TABLET | ORAL | Status: DC

## 2013-09-27 NOTE — Patient Instructions (Addendum)
Increase coumadin to one and a half every day except one tablet on Sunday. Recheck in one month.

## 2013-09-27 NOTE — Progress Notes (Signed)
  Subjective:    Patient ID: Eduardo Lee, male    DOB: [birth year], 79 y.o.   MRN: [id number]  HPIHere for a 3 month check up. No concerns.  INR 1.7 today. Takes 4mg . One and a half every day except Sunday and Wednesday take one 4 mg tablet. Needs a refill on coumadin.   patient compliant with medicine  Notes ongoing leg swelling notes improved stockings are helping  Trying to watch diet. Compliant with his Coumadin.  Walking regularly. Still some hip pain    Review of Systems  no chest pain no back pain no shortness of breath no abdominal pain    Objective:   Physical Exam   alert no apparent distress lungs clear heart regular in rhythm HET normal ankles trace edema       Assessment & Plan:   impression 1 anticoagulation suboptimal in discussed #2 venous stasis stable #3 hypertension decent control for type 2 diabetes followed by specialist plan Coumadin adjusted monthly INRs recheck in several months that exercise discussed. WSL

## 2013-09-29 DIAGNOSIS — E119 Type 2 diabetes mellitus without complications: Secondary | ICD-10-CM | POA: Diagnosis not present

## 2013-09-29 DIAGNOSIS — H6120 Impacted cerumen, unspecified ear: Secondary | ICD-10-CM | POA: Diagnosis not present

## 2013-10-04 ENCOUNTER — Ambulatory Visit: Payer: Medicare Other | Admitting: Family Medicine

## 2013-10-25 ENCOUNTER — Other Ambulatory Visit: Payer: Self-pay | Admitting: Family Medicine

## 2013-10-27 ENCOUNTER — Ambulatory Visit (INDEPENDENT_AMBULATORY_CARE_PROVIDER_SITE_OTHER): Payer: Medicare Other | Admitting: *Deleted

## 2013-10-27 DIAGNOSIS — Z7901 Long term (current) use of anticoagulants: Secondary | ICD-10-CM

## 2013-10-27 NOTE — Patient Instructions (Signed)
Continue the same directions. Take one and a half every day except one on Sunday. Recheck one month

## 2013-11-30 ENCOUNTER — Ambulatory Visit: Payer: Medicare Other

## 2013-12-07 DIAGNOSIS — H35369 Drusen (degenerative) of macula, unspecified eye: Secondary | ICD-10-CM | POA: Diagnosis not present

## 2013-12-07 DIAGNOSIS — E1139 Type 2 diabetes mellitus with other diabetic ophthalmic complication: Secondary | ICD-10-CM | POA: Diagnosis not present

## 2013-12-07 DIAGNOSIS — H35379 Puckering of macula, unspecified eye: Secondary | ICD-10-CM | POA: Diagnosis not present

## 2013-12-07 DIAGNOSIS — E11359 Type 2 diabetes mellitus with proliferative diabetic retinopathy without macular edema: Secondary | ICD-10-CM | POA: Diagnosis not present

## 2013-12-07 DIAGNOSIS — H353190 Nonexudative age-related macular degeneration, unspecified eye, stage unspecified: Secondary | ICD-10-CM | POA: Diagnosis not present

## 2013-12-07 DIAGNOSIS — H4011X0 Primary open-angle glaucoma, stage unspecified: Secondary | ICD-10-CM | POA: Diagnosis not present

## 2013-12-07 DIAGNOSIS — E1165 Type 2 diabetes mellitus with hyperglycemia: Secondary | ICD-10-CM | POA: Diagnosis not present

## 2013-12-08 ENCOUNTER — Other Ambulatory Visit: Payer: Self-pay | Admitting: *Deleted

## 2013-12-08 ENCOUNTER — Ambulatory Visit (INDEPENDENT_AMBULATORY_CARE_PROVIDER_SITE_OTHER): Payer: Medicare Other | Admitting: *Deleted

## 2013-12-08 DIAGNOSIS — Z7901 Long term (current) use of anticoagulants: Secondary | ICD-10-CM | POA: Diagnosis not present

## 2013-12-08 LAB — POCT INR: INR: 1.8

## 2013-12-08 MED ORDER — WARFARIN SODIUM 4 MG PO TABS: ORAL_TABLET | ORAL | Status: DC

## 2013-12-08 NOTE — Patient Instructions (Signed)
Take 1.5 tablets everyday. Recheck in one month.

## 2013-12-09 ENCOUNTER — Other Ambulatory Visit: Payer: Self-pay | Admitting: Family Medicine

## 2013-12-09 DIAGNOSIS — E785 Hyperlipidemia, unspecified: Secondary | ICD-10-CM | POA: Diagnosis not present

## 2013-12-09 DIAGNOSIS — E1065 Type 1 diabetes mellitus with hyperglycemia: Secondary | ICD-10-CM | POA: Diagnosis not present

## 2013-12-09 DIAGNOSIS — E1029 Type 1 diabetes mellitus with other diabetic kidney complication: Secondary | ICD-10-CM | POA: Diagnosis not present

## 2013-12-09 DIAGNOSIS — E559 Vitamin D deficiency, unspecified: Secondary | ICD-10-CM | POA: Diagnosis not present

## 2013-12-10 DIAGNOSIS — E1142 Type 2 diabetes mellitus with diabetic polyneuropathy: Secondary | ICD-10-CM | POA: Diagnosis not present

## 2013-12-10 DIAGNOSIS — E785 Hyperlipidemia, unspecified: Secondary | ICD-10-CM | POA: Diagnosis not present

## 2013-12-10 DIAGNOSIS — E1039 Type 1 diabetes mellitus with other diabetic ophthalmic complication: Secondary | ICD-10-CM | POA: Diagnosis not present

## 2013-12-10 DIAGNOSIS — E1029 Type 1 diabetes mellitus with other diabetic kidney complication: Secondary | ICD-10-CM | POA: Diagnosis not present

## 2013-12-10 DIAGNOSIS — E1049 Type 1 diabetes mellitus with other diabetic neurological complication: Secondary | ICD-10-CM | POA: Diagnosis not present

## 2013-12-10 DIAGNOSIS — I10 Essential (primary) hypertension: Secondary | ICD-10-CM | POA: Diagnosis not present

## 2013-12-10 DIAGNOSIS — E1065 Type 1 diabetes mellitus with hyperglycemia: Secondary | ICD-10-CM | POA: Diagnosis not present

## 2013-12-10 DIAGNOSIS — N183 Chronic kidney disease, stage 3 unspecified: Secondary | ICD-10-CM | POA: Diagnosis not present

## 2013-12-10 DIAGNOSIS — E11359 Type 2 diabetes mellitus with proliferative diabetic retinopathy without macular edema: Secondary | ICD-10-CM | POA: Diagnosis not present

## 2014-01-25 ENCOUNTER — Ambulatory Visit (INDEPENDENT_AMBULATORY_CARE_PROVIDER_SITE_OTHER): Payer: Medicare Other | Admitting: *Deleted

## 2014-01-25 DIAGNOSIS — Z7901 Long term (current) use of anticoagulants: Secondary | ICD-10-CM | POA: Diagnosis not present

## 2014-01-25 LAB — POCT INR: INR: 2.8

## 2014-01-25 NOTE — Patient Instructions (Signed)
Take one and a half tablet every day except Sunday take one tablet. Recheck in one month.

## 2014-02-22 ENCOUNTER — Ambulatory Visit: Payer: Medicare Other

## 2014-02-25 ENCOUNTER — Ambulatory Visit (INDEPENDENT_AMBULATORY_CARE_PROVIDER_SITE_OTHER): Payer: Medicare Other | Admitting: *Deleted

## 2014-02-25 DIAGNOSIS — Z7901 Long term (current) use of anticoagulants: Secondary | ICD-10-CM

## 2014-02-25 LAB — POCT INR: INR: 2.2

## 2014-02-25 NOTE — Patient Instructions (Signed)
Continue the same dose of coumadin. Recheck in 4 weeks.

## 2014-03-11 ENCOUNTER — Other Ambulatory Visit: Payer: Self-pay | Admitting: Family Medicine

## 2014-03-14 DIAGNOSIS — E1029 Type 1 diabetes mellitus with other diabetic kidney complication: Secondary | ICD-10-CM | POA: Diagnosis not present

## 2014-03-14 DIAGNOSIS — E785 Hyperlipidemia, unspecified: Secondary | ICD-10-CM | POA: Diagnosis not present

## 2014-03-14 DIAGNOSIS — E1065 Type 1 diabetes mellitus with hyperglycemia: Secondary | ICD-10-CM | POA: Diagnosis not present

## 2014-03-16 DIAGNOSIS — E1039 Type 1 diabetes mellitus with other diabetic ophthalmic complication: Secondary | ICD-10-CM | POA: Diagnosis not present

## 2014-03-16 DIAGNOSIS — E1029 Type 1 diabetes mellitus with other diabetic kidney complication: Secondary | ICD-10-CM | POA: Diagnosis not present

## 2014-03-16 DIAGNOSIS — E1065 Type 1 diabetes mellitus with hyperglycemia: Secondary | ICD-10-CM | POA: Diagnosis not present

## 2014-03-16 DIAGNOSIS — E785 Hyperlipidemia, unspecified: Secondary | ICD-10-CM | POA: Diagnosis not present

## 2014-03-16 DIAGNOSIS — E1142 Type 2 diabetes mellitus with diabetic polyneuropathy: Secondary | ICD-10-CM | POA: Diagnosis not present

## 2014-03-16 DIAGNOSIS — E11359 Type 2 diabetes mellitus with proliferative diabetic retinopathy without macular edema: Secondary | ICD-10-CM | POA: Diagnosis not present

## 2014-03-16 DIAGNOSIS — I10 Essential (primary) hypertension: Secondary | ICD-10-CM | POA: Diagnosis not present

## 2014-03-16 DIAGNOSIS — N183 Chronic kidney disease, stage 3 unspecified: Secondary | ICD-10-CM | POA: Diagnosis not present

## 2014-03-16 DIAGNOSIS — E1049 Type 1 diabetes mellitus with other diabetic neurological complication: Secondary | ICD-10-CM | POA: Diagnosis not present

## 2014-03-30 ENCOUNTER — Ambulatory Visit (INDEPENDENT_AMBULATORY_CARE_PROVIDER_SITE_OTHER): Payer: Medicare Other | Admitting: *Deleted

## 2014-03-30 ENCOUNTER — Other Ambulatory Visit: Payer: Self-pay | Admitting: Family Medicine

## 2014-03-30 DIAGNOSIS — Z7901 Long term (current) use of anticoagulants: Secondary | ICD-10-CM

## 2014-03-30 LAB — POCT INR: INR: 2.6

## 2014-03-30 NOTE — Patient Instructions (Signed)
Take one and a half tablets every day except Sunday take one tablet. Recheck in 4 weeks 

## 2014-04-09 ENCOUNTER — Other Ambulatory Visit: Payer: Self-pay | Admitting: Family Medicine

## 2014-04-28 ENCOUNTER — Telehealth: Payer: Self-pay | Admitting: Family Medicine

## 2014-04-28 ENCOUNTER — Ambulatory Visit (INDEPENDENT_AMBULATORY_CARE_PROVIDER_SITE_OTHER): Payer: Medicare Other | Admitting: *Deleted

## 2014-04-28 ENCOUNTER — Other Ambulatory Visit: Payer: Self-pay | Admitting: Family Medicine

## 2014-04-28 DIAGNOSIS — Z7901 Long term (current) use of anticoagulants: Secondary | ICD-10-CM

## 2014-04-28 LAB — POCT INR: INR: 2.3

## 2014-04-28 MED ORDER — CITALOPRAM HYDROBROMIDE 20 MG PO TABS: ORAL_TABLET | ORAL | Status: DC

## 2014-04-28 NOTE — Telephone Encounter (Signed)
Patient needs Rx for citalopram HBR 20 mg

## 2014-04-28 NOTE — Patient Instructions (Signed)
Take one and a half tablets every day except Sunday take one tablet. Recheck in 4 weeks

## 2014-04-29 ENCOUNTER — Telehealth: Payer: Self-pay | Admitting: Family Medicine

## 2014-04-29 ENCOUNTER — Other Ambulatory Visit: Payer: Self-pay | Admitting: Family Medicine

## 2014-04-29 MED ORDER — CITALOPRAM HYDROBROMIDE 20 MG PO TABS: ORAL_TABLET | ORAL | Status: DC

## 2014-04-29 MED ORDER — "INSULIN SYRINGE-NEEDLE U-100 31G X 5/16"" 0.3 ML MISC": Status: DC

## 2014-04-29 NOTE — Telephone Encounter (Signed)
Rx sent electronically to pharmacy. Patient notified. 

## 2014-04-29 NOTE — Telephone Encounter (Signed)
Med sent in.

## 2014-04-29 NOTE — Telephone Encounter (Signed)
Need refills for 2 rx's  Please send both to [facility] in [place] Ph# [phone] (please call when done)  citalopram (CELEXA) 20 MG tablet  BD INSULIN SYRINGE ULTRAFINE 31G X 5/16" 0.3 ML MISC

## 2014-06-07 ENCOUNTER — Ambulatory Visit (INDEPENDENT_AMBULATORY_CARE_PROVIDER_SITE_OTHER): Payer: Medicare Other | Admitting: Family Medicine

## 2014-06-07 ENCOUNTER — Ambulatory Visit: Payer: Medicare Other

## 2014-06-07 ENCOUNTER — Encounter: Payer: Self-pay | Admitting: Family Medicine

## 2014-06-07 VITALS — BP 116/74 | Ht 68.0 in | Wt 145.0 lb

## 2014-06-07 DIAGNOSIS — H35379 Puckering of macula, unspecified eye: Secondary | ICD-10-CM | POA: Diagnosis not present

## 2014-06-07 DIAGNOSIS — Z23 Encounter for immunization: Secondary | ICD-10-CM | POA: Diagnosis not present

## 2014-06-07 DIAGNOSIS — H4011X0 Primary open-angle glaucoma, stage unspecified: Secondary | ICD-10-CM | POA: Diagnosis not present

## 2014-06-07 DIAGNOSIS — Z7901 Long term (current) use of anticoagulants: Secondary | ICD-10-CM

## 2014-06-07 DIAGNOSIS — M810 Age-related osteoporosis without current pathological fracture: Secondary | ICD-10-CM

## 2014-06-07 DIAGNOSIS — E1139 Type 2 diabetes mellitus with other diabetic ophthalmic complication: Secondary | ICD-10-CM | POA: Diagnosis not present

## 2014-06-07 DIAGNOSIS — E1065 Type 1 diabetes mellitus with hyperglycemia: Secondary | ICD-10-CM | POA: Diagnosis not present

## 2014-06-07 DIAGNOSIS — E119 Type 2 diabetes mellitus without complications: Secondary | ICD-10-CM

## 2014-06-07 DIAGNOSIS — E785 Hyperlipidemia, unspecified: Secondary | ICD-10-CM | POA: Diagnosis not present

## 2014-06-07 DIAGNOSIS — I10 Essential (primary) hypertension: Secondary | ICD-10-CM | POA: Diagnosis not present

## 2014-06-07 DIAGNOSIS — E11359 Type 2 diabetes mellitus with proliferative diabetic retinopathy without macular edema: Secondary | ICD-10-CM | POA: Diagnosis not present

## 2014-06-07 DIAGNOSIS — E1029 Type 1 diabetes mellitus with other diabetic kidney complication: Secondary | ICD-10-CM | POA: Diagnosis not present

## 2014-06-07 DIAGNOSIS — H353190 Nonexudative age-related macular degeneration, unspecified eye, stage unspecified: Secondary | ICD-10-CM | POA: Diagnosis not present

## 2014-06-07 LAB — POCT INR: INR: 1.8

## 2014-06-07 MED ORDER — WARFARIN SODIUM 4 MG PO TABS: ORAL_TABLET | ORAL | Status: DC

## 2014-06-07 MED ORDER — CITALOPRAM HYDROBROMIDE 20 MG PO TABS: ORAL_TABLET | ORAL | Status: DC

## 2014-06-07 MED ORDER — EZETIMIBE 10 MG PO TABS: 10.0000 mg | ORAL_TABLET | Freq: Every day | ORAL | Status: DC

## 2014-06-07 NOTE — Progress Notes (Signed)
   Subjective:    Patient ID: Eduardo Lee, male    DOB: 1926-03-08, 80 y.o.   MRN: [id number]  Hypertension This is a chronic problem.  Eats healthy diet. Takes meds every day.   Sees specialist for diabetes.   Concerns about toenails.   INR today 1.8 compliant with Coumadin. No symptoms of DVT.  No new symptoms of TIA or stroke.  Trying his best to watch his diet. Does walk some. Compromised by his blindness.  Compliant with all medications.    Review of Systems No headache no chest pain no back pain no abdominal pain no change in bowel habits    Objective:   Physical Exam  Alert nearly blind HEENT normal. Lungs clear. Heart rhythm regular today ankles trace edema. Bilateral venous stasis changes noted.      Assessment & Plan:  Impression 1 hypertension good control. #2 status post Luz Lex clinically stable. #3 chronic anticoagulation stable. #4 osteoporosis ongoing patient claims compliance with medicines.

## 2014-06-07 NOTE — Patient Instructions (Addendum)
Take one and a half tablets every day and recheck in 4 weeks.

## 2014-06-09 DIAGNOSIS — E1039 Type 1 diabetes mellitus with other diabetic ophthalmic complication: Secondary | ICD-10-CM | POA: Diagnosis not present

## 2014-06-09 DIAGNOSIS — E785 Hyperlipidemia, unspecified: Secondary | ICD-10-CM | POA: Diagnosis not present

## 2014-06-09 DIAGNOSIS — N183 Chronic kidney disease, stage 3 unspecified: Secondary | ICD-10-CM | POA: Diagnosis not present

## 2014-06-09 DIAGNOSIS — E1065 Type 1 diabetes mellitus with hyperglycemia: Secondary | ICD-10-CM | POA: Diagnosis not present

## 2014-06-09 DIAGNOSIS — E1049 Type 1 diabetes mellitus with other diabetic neurological complication: Secondary | ICD-10-CM | POA: Diagnosis not present

## 2014-06-09 DIAGNOSIS — I10 Essential (primary) hypertension: Secondary | ICD-10-CM | POA: Diagnosis not present

## 2014-06-09 DIAGNOSIS — E11359 Type 2 diabetes mellitus with proliferative diabetic retinopathy without macular edema: Secondary | ICD-10-CM | POA: Diagnosis not present

## 2014-06-09 DIAGNOSIS — E1029 Type 1 diabetes mellitus with other diabetic kidney complication: Secondary | ICD-10-CM | POA: Diagnosis not present

## 2014-06-09 DIAGNOSIS — E1142 Type 2 diabetes mellitus with diabetic polyneuropathy: Secondary | ICD-10-CM | POA: Diagnosis not present

## 2014-06-22 ENCOUNTER — Encounter: Payer: Self-pay | Admitting: Family Medicine

## 2014-06-22 DIAGNOSIS — N183 Chronic kidney disease, stage 3 unspecified: Secondary | ICD-10-CM | POA: Diagnosis not present

## 2014-06-23 DIAGNOSIS — M810 Age-related osteoporosis without current pathological fracture: Secondary | ICD-10-CM | POA: Insufficient documentation

## 2014-07-08 ENCOUNTER — Ambulatory Visit (INDEPENDENT_AMBULATORY_CARE_PROVIDER_SITE_OTHER): Payer: Medicare Other | Admitting: *Deleted

## 2014-07-08 DIAGNOSIS — Z7901 Long term (current) use of anticoagulants: Secondary | ICD-10-CM | POA: Diagnosis not present

## 2014-07-08 LAB — POCT INR: INR: 2

## 2014-08-10 ENCOUNTER — Other Ambulatory Visit: Payer: Self-pay | Admitting: Family Medicine

## 2014-08-10 ENCOUNTER — Ambulatory Visit (INDEPENDENT_AMBULATORY_CARE_PROVIDER_SITE_OTHER): Payer: Medicare Other

## 2014-08-10 DIAGNOSIS — Z7901 Long term (current) use of anticoagulants: Secondary | ICD-10-CM | POA: Diagnosis not present

## 2014-08-10 LAB — POCT INR: INR: 2.4

## 2014-08-19 ENCOUNTER — Other Ambulatory Visit: Payer: Self-pay | Admitting: Family Medicine

## 2014-08-26 DIAGNOSIS — B351 Tinea unguium: Secondary | ICD-10-CM | POA: Diagnosis not present

## 2014-08-26 DIAGNOSIS — I739 Peripheral vascular disease, unspecified: Secondary | ICD-10-CM | POA: Diagnosis not present

## 2014-09-07 ENCOUNTER — Ambulatory Visit (INDEPENDENT_AMBULATORY_CARE_PROVIDER_SITE_OTHER): Payer: Medicare Other | Admitting: *Deleted

## 2014-09-07 DIAGNOSIS — E559 Vitamin D deficiency, unspecified: Secondary | ICD-10-CM | POA: Diagnosis not present

## 2014-09-07 DIAGNOSIS — E1021 Type 1 diabetes mellitus with diabetic nephropathy: Secondary | ICD-10-CM | POA: Diagnosis not present

## 2014-09-07 DIAGNOSIS — E785 Hyperlipidemia, unspecified: Secondary | ICD-10-CM | POA: Diagnosis not present

## 2014-09-07 DIAGNOSIS — Z7901 Long term (current) use of anticoagulants: Secondary | ICD-10-CM

## 2014-09-07 DIAGNOSIS — Z23 Encounter for immunization: Secondary | ICD-10-CM | POA: Diagnosis not present

## 2014-09-07 LAB — POCT INR: INR: 2.6

## 2014-09-08 DIAGNOSIS — M810 Age-related osteoporosis without current pathological fracture: Secondary | ICD-10-CM | POA: Diagnosis not present

## 2014-09-08 DIAGNOSIS — E10359 Type 1 diabetes mellitus with proliferative diabetic retinopathy without macular edema: Secondary | ICD-10-CM | POA: Diagnosis not present

## 2014-09-08 DIAGNOSIS — E559 Vitamin D deficiency, unspecified: Secondary | ICD-10-CM | POA: Diagnosis not present

## 2014-09-08 DIAGNOSIS — E780 Pure hypercholesterolemia: Secondary | ICD-10-CM | POA: Diagnosis not present

## 2014-09-08 DIAGNOSIS — I10 Essential (primary) hypertension: Secondary | ICD-10-CM | POA: Diagnosis not present

## 2014-09-08 DIAGNOSIS — E1022 Type 1 diabetes mellitus with diabetic chronic kidney disease: Secondary | ICD-10-CM | POA: Diagnosis not present

## 2014-09-08 DIAGNOSIS — E1042 Type 1 diabetes mellitus with diabetic polyneuropathy: Secondary | ICD-10-CM | POA: Diagnosis not present

## 2014-09-08 DIAGNOSIS — E1065 Type 1 diabetes mellitus with hyperglycemia: Secondary | ICD-10-CM | POA: Diagnosis not present

## 2014-10-07 ENCOUNTER — Ambulatory Visit (INDEPENDENT_AMBULATORY_CARE_PROVIDER_SITE_OTHER): Payer: Medicare Other | Admitting: *Deleted

## 2014-10-07 DIAGNOSIS — Z7901 Long term (current) use of anticoagulants: Secondary | ICD-10-CM

## 2014-10-07 LAB — POCT INR: INR: 2.5

## 2014-10-07 NOTE — Patient Instructions (Signed)
Coumadin 4mg  tablets. Take one and a half every day. Recheck in 4 weeks

## 2014-11-04 ENCOUNTER — Ambulatory Visit (INDEPENDENT_AMBULATORY_CARE_PROVIDER_SITE_OTHER): Payer: Medicare Other

## 2014-11-04 DIAGNOSIS — B351 Tinea unguium: Secondary | ICD-10-CM | POA: Diagnosis not present

## 2014-11-04 DIAGNOSIS — E1342 Other specified diabetes mellitus with diabetic polyneuropathy: Secondary | ICD-10-CM | POA: Diagnosis not present

## 2014-11-04 DIAGNOSIS — Z7901 Long term (current) use of anticoagulants: Secondary | ICD-10-CM | POA: Diagnosis not present

## 2014-11-04 DIAGNOSIS — I739 Peripheral vascular disease, unspecified: Secondary | ICD-10-CM | POA: Diagnosis not present

## 2014-11-04 LAB — POCT INR: INR: 4.1

## 2014-11-18 ENCOUNTER — Ambulatory Visit (INDEPENDENT_AMBULATORY_CARE_PROVIDER_SITE_OTHER): Payer: Medicare Other

## 2014-11-18 DIAGNOSIS — Z7901 Long term (current) use of anticoagulants: Secondary | ICD-10-CM

## 2014-11-18 LAB — POCT INR: INR: 2.6

## 2014-12-06 ENCOUNTER — Ambulatory Visit (INDEPENDENT_AMBULATORY_CARE_PROVIDER_SITE_OTHER): Payer: Medicare Other | Admitting: Family Medicine

## 2014-12-06 ENCOUNTER — Encounter: Payer: Self-pay | Admitting: Family Medicine

## 2014-12-06 VITALS — BP 128/84 | Ht 68.0 in

## 2014-12-06 DIAGNOSIS — Z7901 Long term (current) use of anticoagulants: Secondary | ICD-10-CM

## 2014-12-06 DIAGNOSIS — E1342 Other specified diabetes mellitus with diabetic polyneuropathy: Secondary | ICD-10-CM | POA: Diagnosis not present

## 2014-12-06 DIAGNOSIS — E11359 Type 2 diabetes mellitus with proliferative diabetic retinopathy without macular edema: Secondary | ICD-10-CM | POA: Diagnosis not present

## 2014-12-06 DIAGNOSIS — H4011X3 Primary open-angle glaucoma, severe stage: Secondary | ICD-10-CM | POA: Diagnosis not present

## 2014-12-06 DIAGNOSIS — E780 Pure hypercholesterolemia: Secondary | ICD-10-CM | POA: Diagnosis not present

## 2014-12-06 DIAGNOSIS — E1142 Type 2 diabetes mellitus with diabetic polyneuropathy: Secondary | ICD-10-CM

## 2014-12-06 DIAGNOSIS — I10 Essential (primary) hypertension: Secondary | ICD-10-CM

## 2014-12-06 DIAGNOSIS — G8929 Other chronic pain: Secondary | ICD-10-CM | POA: Diagnosis not present

## 2014-12-06 DIAGNOSIS — M549 Dorsalgia, unspecified: Secondary | ICD-10-CM

## 2014-12-06 DIAGNOSIS — M546 Pain in thoracic spine: Secondary | ICD-10-CM | POA: Diagnosis not present

## 2014-12-06 DIAGNOSIS — H35372 Puckering of macula, left eye: Secondary | ICD-10-CM | POA: Diagnosis not present

## 2014-12-06 DIAGNOSIS — G629 Polyneuropathy, unspecified: Secondary | ICD-10-CM | POA: Diagnosis not present

## 2014-12-06 DIAGNOSIS — H3531 Nonexudative age-related macular degeneration: Secondary | ICD-10-CM | POA: Diagnosis not present

## 2014-12-06 DIAGNOSIS — E1022 Type 1 diabetes mellitus with diabetic chronic kidney disease: Secondary | ICD-10-CM | POA: Diagnosis not present

## 2014-12-06 LAB — POCT INR: INR: 1.9

## 2014-12-06 NOTE — Progress Notes (Signed)
   Subjective:    Patient ID: Eduardo Lee, male    DOB: August 25, 1926, 81 y.o.   MRN: [id number]  Hypertension This is a chronic problem. The current episode started more than 1 year ago. Risk factors for coronary artery disease include diabetes mellitus, male gender and dyslipidemia.   Patient having back pain, Has been hurting from the neck on into the left side,  Hurting every day, partic when up Worse the past few yrs, worse lately  Patient still dealing with neuropathy of the feet. He has his girlfriend examine them daily. No recent sores. Unsteady at times when walking.  Continues to see ophthalmologist. 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  Review of Systems No headache no chest pain no abdominal pain no change in bowel habits no blood in stool no rash ROS otherwise negative    Objective:   Physical Exam  Alert blood pressure good on repeat. HEENT normal. Lungs clear heart rare rhythm left supraclavicular and suprascapular tenderness to palpation shoulder good range of motion. Feet pulses diminished but present sensation absent.      Assessment & Plan:  Impression 1 hypertension good control #2 chronic left upper back and lateral cervical pain. Non-neuropathic in nature. Explained to patient. Doubt that MRI or neurosurgical intervention would help. #3 diabetic neuropathy discussed #4 chronic anticoagulation numbers evaluated and discussed with patient plan diet exercise discussed. Maintain same medications. Recheck as scheduled. WSL

## 2014-12-07 DIAGNOSIS — E780 Pure hypercholesterolemia: Secondary | ICD-10-CM | POA: Diagnosis not present

## 2014-12-07 DIAGNOSIS — E1022 Type 1 diabetes mellitus with diabetic chronic kidney disease: Secondary | ICD-10-CM | POA: Diagnosis not present

## 2014-12-07 DIAGNOSIS — E1042 Type 1 diabetes mellitus with diabetic polyneuropathy: Secondary | ICD-10-CM | POA: Diagnosis not present

## 2014-12-07 DIAGNOSIS — E559 Vitamin D deficiency, unspecified: Secondary | ICD-10-CM | POA: Diagnosis not present

## 2014-12-07 DIAGNOSIS — M810 Age-related osteoporosis without current pathological fracture: Secondary | ICD-10-CM | POA: Diagnosis not present

## 2014-12-07 DIAGNOSIS — E10359 Type 1 diabetes mellitus with proliferative diabetic retinopathy without macular edema: Secondary | ICD-10-CM | POA: Diagnosis not present

## 2014-12-07 DIAGNOSIS — E1065 Type 1 diabetes mellitus with hyperglycemia: Secondary | ICD-10-CM | POA: Diagnosis not present

## 2014-12-07 DIAGNOSIS — I10 Essential (primary) hypertension: Secondary | ICD-10-CM | POA: Diagnosis not present

## 2014-12-11 DIAGNOSIS — G8929 Other chronic pain: Secondary | ICD-10-CM | POA: Insufficient documentation

## 2014-12-11 DIAGNOSIS — M549 Dorsalgia, unspecified: Secondary | ICD-10-CM

## 2014-12-11 DIAGNOSIS — E1142 Type 2 diabetes mellitus with diabetic polyneuropathy: Secondary | ICD-10-CM | POA: Insufficient documentation

## 2014-12-16 ENCOUNTER — Ambulatory Visit: Payer: Medicare Other

## 2014-12-26 ENCOUNTER — Other Ambulatory Visit: Payer: Self-pay | Admitting: Family Medicine

## 2015-01-03 ENCOUNTER — Ambulatory Visit (INDEPENDENT_AMBULATORY_CARE_PROVIDER_SITE_OTHER): Payer: Medicare Other | Admitting: *Deleted

## 2015-01-03 DIAGNOSIS — Z7901 Long term (current) use of anticoagulants: Secondary | ICD-10-CM | POA: Diagnosis not present

## 2015-01-03 LAB — POCT INR: INR: 5.1

## 2015-01-03 NOTE — Patient Instructions (Signed)
No coumadin today. Then take 1.5 all days except on Thursday, take 1 tablet. Recheck in 2 weeks.

## 2015-01-13 ENCOUNTER — Other Ambulatory Visit: Payer: Self-pay | Admitting: Family Medicine

## 2015-01-13 ENCOUNTER — Ambulatory Visit (INDEPENDENT_AMBULATORY_CARE_PROVIDER_SITE_OTHER): Payer: Medicare Other | Admitting: *Deleted

## 2015-01-13 DIAGNOSIS — I739 Peripheral vascular disease, unspecified: Secondary | ICD-10-CM | POA: Diagnosis not present

## 2015-01-13 DIAGNOSIS — Z7901 Long term (current) use of anticoagulants: Secondary | ICD-10-CM

## 2015-01-13 DIAGNOSIS — B351 Tinea unguium: Secondary | ICD-10-CM | POA: Diagnosis not present

## 2015-01-13 DIAGNOSIS — E1342 Other specified diabetes mellitus with diabetic polyneuropathy: Secondary | ICD-10-CM | POA: Diagnosis not present

## 2015-01-13 LAB — POCT INR: INR: 3.2

## 2015-01-13 NOTE — Patient Instructions (Signed)
Take 1.5 all days except on Thursday, take 1 tablet. Recheck in 4 weeks.

## 2015-01-23 DIAGNOSIS — M961 Postlaminectomy syndrome, not elsewhere classified: Secondary | ICD-10-CM | POA: Diagnosis not present

## 2015-01-23 DIAGNOSIS — M545 Low back pain: Secondary | ICD-10-CM | POA: Diagnosis not present

## 2015-01-31 DIAGNOSIS — M47816 Spondylosis without myelopathy or radiculopathy, lumbar region: Secondary | ICD-10-CM | POA: Diagnosis not present

## 2015-01-31 DIAGNOSIS — M47817 Spondylosis without myelopathy or radiculopathy, lumbosacral region: Secondary | ICD-10-CM | POA: Diagnosis not present

## 2015-02-04 ENCOUNTER — Other Ambulatory Visit: Payer: Self-pay | Admitting: Family Medicine

## 2015-02-08 ENCOUNTER — Telehealth: Payer: Self-pay | Admitting: Family Medicine

## 2015-02-08 NOTE — Telephone Encounter (Signed)
Pt got [place] today from the beach to find a letter for jury summons He needs a letter from our office stating he can not attend this due to  His age and health condition. They have a appt this Friday morning an Would like this letter ready for pick up that morning if at all possible.   Thanks

## 2015-02-10 ENCOUNTER — Ambulatory Visit (INDEPENDENT_AMBULATORY_CARE_PROVIDER_SITE_OTHER): Payer: Medicare Other | Admitting: *Deleted

## 2015-02-10 DIAGNOSIS — Z7901 Long term (current) use of anticoagulants: Secondary | ICD-10-CM | POA: Diagnosis not present

## 2015-02-10 LAB — POCT INR: INR: 2.6

## 2015-03-10 ENCOUNTER — Ambulatory Visit (INDEPENDENT_AMBULATORY_CARE_PROVIDER_SITE_OTHER): Payer: Medicare Other | Admitting: *Deleted

## 2015-03-10 DIAGNOSIS — E1042 Type 1 diabetes mellitus with diabetic polyneuropathy: Secondary | ICD-10-CM | POA: Diagnosis not present

## 2015-03-10 DIAGNOSIS — M810 Age-related osteoporosis without current pathological fracture: Secondary | ICD-10-CM | POA: Diagnosis not present

## 2015-03-10 DIAGNOSIS — E10359 Type 1 diabetes mellitus with proliferative diabetic retinopathy without macular edema: Secondary | ICD-10-CM | POA: Diagnosis not present

## 2015-03-10 DIAGNOSIS — E559 Vitamin D deficiency, unspecified: Secondary | ICD-10-CM | POA: Diagnosis not present

## 2015-03-10 DIAGNOSIS — I10 Essential (primary) hypertension: Secondary | ICD-10-CM | POA: Diagnosis not present

## 2015-03-10 DIAGNOSIS — E1065 Type 1 diabetes mellitus with hyperglycemia: Secondary | ICD-10-CM | POA: Diagnosis not present

## 2015-03-10 DIAGNOSIS — E780 Pure hypercholesterolemia: Secondary | ICD-10-CM | POA: Diagnosis not present

## 2015-03-10 DIAGNOSIS — Z7901 Long term (current) use of anticoagulants: Secondary | ICD-10-CM

## 2015-03-10 DIAGNOSIS — E1022 Type 1 diabetes mellitus with diabetic chronic kidney disease: Secondary | ICD-10-CM | POA: Diagnosis not present

## 2015-03-10 LAB — POCT INR: INR: 1.7

## 2015-03-10 NOTE — Patient Instructions (Signed)
Continue the same coumadin dose. Take one and a half tablets every day except one tablet on Thursday. Recheck in 2 weeks

## 2015-03-24 ENCOUNTER — Ambulatory Visit (INDEPENDENT_AMBULATORY_CARE_PROVIDER_SITE_OTHER): Payer: Medicare Other

## 2015-03-24 ENCOUNTER — Ambulatory Visit: Payer: Medicare Other

## 2015-03-24 DIAGNOSIS — Z7901 Long term (current) use of anticoagulants: Secondary | ICD-10-CM | POA: Diagnosis not present

## 2015-03-24 LAB — POCT INR: INR: 3

## 2015-04-21 ENCOUNTER — Ambulatory Visit (INDEPENDENT_AMBULATORY_CARE_PROVIDER_SITE_OTHER): Payer: Medicare Other | Admitting: *Deleted

## 2015-04-21 DIAGNOSIS — Z7901 Long term (current) use of anticoagulants: Secondary | ICD-10-CM | POA: Diagnosis not present

## 2015-04-21 LAB — POCT INR: INR: 2.7

## 2015-04-21 NOTE — Patient Instructions (Signed)
Continue the same directions on coumadin. One and a half tablets every day except on Thursday take one tablet. Recheck in 4 weeks

## 2015-05-17 ENCOUNTER — Other Ambulatory Visit: Payer: Self-pay | Admitting: Family Medicine

## 2015-05-19 ENCOUNTER — Ambulatory Visit (INDEPENDENT_AMBULATORY_CARE_PROVIDER_SITE_OTHER): Payer: Medicare Other

## 2015-05-19 DIAGNOSIS — Z7901 Long term (current) use of anticoagulants: Secondary | ICD-10-CM

## 2015-05-19 DIAGNOSIS — B351 Tinea unguium: Secondary | ICD-10-CM | POA: Diagnosis not present

## 2015-05-19 DIAGNOSIS — E1342 Other specified diabetes mellitus with diabetic polyneuropathy: Secondary | ICD-10-CM | POA: Diagnosis not present

## 2015-05-19 DIAGNOSIS — I739 Peripheral vascular disease, unspecified: Secondary | ICD-10-CM | POA: Diagnosis not present

## 2015-05-19 LAB — POCT INR: INR: 3.1

## 2015-05-31 ENCOUNTER — Other Ambulatory Visit: Payer: Self-pay

## 2015-05-31 ENCOUNTER — Telehealth: Payer: Self-pay | Admitting: Family Medicine

## 2015-05-31 MED ORDER — WARFARIN SODIUM 4 MG PO TABS: ORAL_TABLET | ORAL | Status: DC

## 2015-05-31 NOTE — Telephone Encounter (Signed)
Refill sent to [facility] at [facility], [place]. Patient was notified.

## 2015-05-31 NOTE — Telephone Encounter (Signed)
Pt is needing a refill on his warfarin   [facility] [address] [place]

## 2015-06-13 DIAGNOSIS — H4011X3 Primary open-angle glaucoma, severe stage: Secondary | ICD-10-CM | POA: Diagnosis not present

## 2015-06-13 DIAGNOSIS — E559 Vitamin D deficiency, unspecified: Secondary | ICD-10-CM | POA: Diagnosis not present

## 2015-06-13 DIAGNOSIS — L97311 Non-pressure chronic ulcer of right ankle limited to breakdown of skin: Secondary | ICD-10-CM | POA: Diagnosis not present

## 2015-06-13 DIAGNOSIS — H3531 Nonexudative age-related macular degeneration: Secondary | ICD-10-CM | POA: Diagnosis not present

## 2015-06-13 DIAGNOSIS — H35372 Puckering of macula, left eye: Secondary | ICD-10-CM | POA: Diagnosis not present

## 2015-06-13 DIAGNOSIS — E780 Pure hypercholesterolemia: Secondary | ICD-10-CM | POA: Diagnosis not present

## 2015-06-13 DIAGNOSIS — E11359 Type 2 diabetes mellitus with proliferative diabetic retinopathy without macular edema: Secondary | ICD-10-CM | POA: Diagnosis not present

## 2015-06-13 DIAGNOSIS — E1065 Type 1 diabetes mellitus with hyperglycemia: Secondary | ICD-10-CM | POA: Diagnosis not present

## 2015-06-13 LAB — HM DIABETES EYE EXAM

## 2015-06-14 ENCOUNTER — Ambulatory Visit (INDEPENDENT_AMBULATORY_CARE_PROVIDER_SITE_OTHER): Payer: Medicare Other | Admitting: *Deleted

## 2015-06-14 DIAGNOSIS — Z7901 Long term (current) use of anticoagulants: Secondary | ICD-10-CM

## 2015-06-14 LAB — POCT INR: INR: 2.5

## 2015-06-15 DIAGNOSIS — E559 Vitamin D deficiency, unspecified: Secondary | ICD-10-CM | POA: Diagnosis not present

## 2015-06-15 DIAGNOSIS — E780 Pure hypercholesterolemia: Secondary | ICD-10-CM | POA: Diagnosis not present

## 2015-06-15 DIAGNOSIS — I10 Essential (primary) hypertension: Secondary | ICD-10-CM | POA: Diagnosis not present

## 2015-06-15 DIAGNOSIS — E1042 Type 1 diabetes mellitus with diabetic polyneuropathy: Secondary | ICD-10-CM | POA: Diagnosis not present

## 2015-06-15 DIAGNOSIS — E1065 Type 1 diabetes mellitus with hyperglycemia: Secondary | ICD-10-CM | POA: Diagnosis not present

## 2015-06-15 DIAGNOSIS — M810 Age-related osteoporosis without current pathological fracture: Secondary | ICD-10-CM | POA: Diagnosis not present

## 2015-06-15 DIAGNOSIS — E1022 Type 1 diabetes mellitus with diabetic chronic kidney disease: Secondary | ICD-10-CM | POA: Diagnosis not present

## 2015-06-15 DIAGNOSIS — E10359 Type 1 diabetes mellitus with proliferative diabetic retinopathy without macular edema: Secondary | ICD-10-CM | POA: Diagnosis not present

## 2015-06-16 DIAGNOSIS — R58 Hemorrhage, not elsewhere classified: Secondary | ICD-10-CM | POA: Diagnosis not present

## 2015-06-16 DIAGNOSIS — E785 Hyperlipidemia, unspecified: Secondary | ICD-10-CM | POA: Diagnosis not present

## 2015-06-16 DIAGNOSIS — R791 Abnormal coagulation profile: Secondary | ICD-10-CM | POA: Diagnosis not present

## 2015-06-16 DIAGNOSIS — Z794 Long term (current) use of insulin: Secondary | ICD-10-CM | POA: Diagnosis not present

## 2015-06-16 DIAGNOSIS — E119 Type 2 diabetes mellitus without complications: Secondary | ICD-10-CM | POA: Diagnosis not present

## 2015-06-16 DIAGNOSIS — Z7901 Long term (current) use of anticoagulants: Secondary | ICD-10-CM | POA: Diagnosis not present

## 2015-06-20 ENCOUNTER — Encounter: Payer: Self-pay | Admitting: *Deleted

## 2015-07-11 ENCOUNTER — Ambulatory Visit (INDEPENDENT_AMBULATORY_CARE_PROVIDER_SITE_OTHER): Payer: Medicare Other

## 2015-07-11 DIAGNOSIS — Z7901 Long term (current) use of anticoagulants: Secondary | ICD-10-CM

## 2015-07-11 DIAGNOSIS — L97311 Non-pressure chronic ulcer of right ankle limited to breakdown of skin: Secondary | ICD-10-CM | POA: Diagnosis not present

## 2015-07-11 LAB — POCT INR: INR: 2.3

## 2015-08-01 DIAGNOSIS — E1142 Type 2 diabetes mellitus with diabetic polyneuropathy: Secondary | ICD-10-CM | POA: Diagnosis not present

## 2015-08-01 DIAGNOSIS — L97311 Non-pressure chronic ulcer of right ankle limited to breakdown of skin: Secondary | ICD-10-CM | POA: Diagnosis not present

## 2015-08-01 DIAGNOSIS — B351 Tinea unguium: Secondary | ICD-10-CM | POA: Diagnosis not present

## 2015-08-09 ENCOUNTER — Ambulatory Visit (INDEPENDENT_AMBULATORY_CARE_PROVIDER_SITE_OTHER): Payer: Medicare Other | Admitting: *Deleted

## 2015-08-09 DIAGNOSIS — Z7901 Long term (current) use of anticoagulants: Secondary | ICD-10-CM | POA: Diagnosis not present

## 2015-08-09 LAB — POCT INR: INR: 2.6

## 2015-09-02 ENCOUNTER — Other Ambulatory Visit: Payer: Self-pay | Admitting: Family Medicine

## 2015-09-12 ENCOUNTER — Ambulatory Visit (INDEPENDENT_AMBULATORY_CARE_PROVIDER_SITE_OTHER): Payer: Medicare Other | Admitting: *Deleted

## 2015-09-12 DIAGNOSIS — Z7901 Long term (current) use of anticoagulants: Secondary | ICD-10-CM | POA: Diagnosis not present

## 2015-09-12 DIAGNOSIS — Z23 Encounter for immunization: Secondary | ICD-10-CM

## 2015-09-12 DIAGNOSIS — L97311 Non-pressure chronic ulcer of right ankle limited to breakdown of skin: Secondary | ICD-10-CM | POA: Diagnosis not present

## 2015-09-12 LAB — POCT INR: INR: 2.9

## 2015-09-13 DIAGNOSIS — E1065 Type 1 diabetes mellitus with hyperglycemia: Secondary | ICD-10-CM | POA: Diagnosis not present

## 2015-09-13 DIAGNOSIS — E7800 Pure hypercholesterolemia, unspecified: Secondary | ICD-10-CM | POA: Diagnosis not present

## 2015-09-13 DIAGNOSIS — E559 Vitamin D deficiency, unspecified: Secondary | ICD-10-CM | POA: Diagnosis not present

## 2015-09-15 DIAGNOSIS — I10 Essential (primary) hypertension: Secondary | ICD-10-CM | POA: Diagnosis not present

## 2015-09-15 DIAGNOSIS — E1042 Type 1 diabetes mellitus with diabetic polyneuropathy: Secondary | ICD-10-CM | POA: Diagnosis not present

## 2015-09-15 DIAGNOSIS — M810 Age-related osteoporosis without current pathological fracture: Secondary | ICD-10-CM | POA: Diagnosis not present

## 2015-09-15 DIAGNOSIS — E1065 Type 1 diabetes mellitus with hyperglycemia: Secondary | ICD-10-CM | POA: Diagnosis not present

## 2015-09-15 DIAGNOSIS — E559 Vitamin D deficiency, unspecified: Secondary | ICD-10-CM | POA: Diagnosis not present

## 2015-09-15 DIAGNOSIS — E1022 Type 1 diabetes mellitus with diabetic chronic kidney disease: Secondary | ICD-10-CM | POA: Diagnosis not present

## 2015-09-15 DIAGNOSIS — E103593 Type 1 diabetes mellitus with proliferative diabetic retinopathy without macular edema, bilateral: Secondary | ICD-10-CM | POA: Diagnosis not present

## 2015-09-15 DIAGNOSIS — E7800 Pure hypercholesterolemia, unspecified: Secondary | ICD-10-CM | POA: Diagnosis not present

## 2015-09-18 ENCOUNTER — Other Ambulatory Visit: Payer: Self-pay | Admitting: Family Medicine

## 2015-09-21 ENCOUNTER — Telehealth: Payer: Self-pay | Admitting: Family Medicine

## 2015-09-21 NOTE — Telephone Encounter (Signed)
Placard form was dropped off for the pt to be filled out.

## 2015-09-22 NOTE — Telephone Encounter (Signed)
Notified [name] to pick form up.

## 2015-10-09 ENCOUNTER — Other Ambulatory Visit: Payer: Self-pay | Admitting: Family Medicine

## 2015-10-10 ENCOUNTER — Ambulatory Visit: Payer: Medicare Other

## 2015-10-10 ENCOUNTER — Ambulatory Visit (INDEPENDENT_AMBULATORY_CARE_PROVIDER_SITE_OTHER): Payer: Medicare Other | Admitting: *Deleted

## 2015-10-10 DIAGNOSIS — Z7901 Long term (current) use of anticoagulants: Secondary | ICD-10-CM

## 2015-10-10 DIAGNOSIS — B351 Tinea unguium: Secondary | ICD-10-CM | POA: Diagnosis not present

## 2015-10-10 DIAGNOSIS — L97311 Non-pressure chronic ulcer of right ankle limited to breakdown of skin: Secondary | ICD-10-CM | POA: Diagnosis not present

## 2015-10-10 DIAGNOSIS — E1142 Type 2 diabetes mellitus with diabetic polyneuropathy: Secondary | ICD-10-CM | POA: Diagnosis not present

## 2015-10-10 LAB — POCT INR: INR: 2.2

## 2015-10-12 DIAGNOSIS — X32XXXA Exposure to sunlight, initial encounter: Secondary | ICD-10-CM | POA: Diagnosis not present

## 2015-10-12 DIAGNOSIS — C44712 Basal cell carcinoma of skin of right lower limb, including hip: Secondary | ICD-10-CM | POA: Diagnosis not present

## 2015-10-12 DIAGNOSIS — D225 Melanocytic nevi of trunk: Secondary | ICD-10-CM | POA: Diagnosis not present

## 2015-10-12 DIAGNOSIS — L570 Actinic keratosis: Secondary | ICD-10-CM | POA: Diagnosis not present

## 2015-11-09 ENCOUNTER — Ambulatory Visit (INDEPENDENT_AMBULATORY_CARE_PROVIDER_SITE_OTHER): Payer: Medicare Other

## 2015-11-09 DIAGNOSIS — Z7901 Long term (current) use of anticoagulants: Secondary | ICD-10-CM | POA: Diagnosis not present

## 2015-11-09 DIAGNOSIS — Z08 Encounter for follow-up examination after completed treatment for malignant neoplasm: Secondary | ICD-10-CM | POA: Diagnosis not present

## 2015-11-09 DIAGNOSIS — Z85828 Personal history of other malignant neoplasm of skin: Secondary | ICD-10-CM | POA: Diagnosis not present

## 2015-11-09 LAB — POCT INR: INR: 3.4

## 2015-12-04 ENCOUNTER — Other Ambulatory Visit: Payer: Self-pay | Admitting: Family Medicine

## 2015-12-05 DIAGNOSIS — S0990XA Unspecified injury of head, initial encounter: Secondary | ICD-10-CM | POA: Diagnosis not present

## 2015-12-05 DIAGNOSIS — Z7901 Long term (current) use of anticoagulants: Secondary | ICD-10-CM | POA: Diagnosis not present

## 2015-12-05 DIAGNOSIS — S060X0A Concussion without loss of consciousness, initial encounter: Secondary | ICD-10-CM | POA: Diagnosis not present

## 2015-12-05 DIAGNOSIS — R51 Headache: Secondary | ICD-10-CM | POA: Diagnosis not present

## 2015-12-05 DIAGNOSIS — R220 Localized swelling, mass and lump, head: Secondary | ICD-10-CM | POA: Diagnosis not present

## 2015-12-05 DIAGNOSIS — S0181XA Laceration without foreign body of other part of head, initial encounter: Secondary | ICD-10-CM | POA: Diagnosis not present

## 2015-12-05 DIAGNOSIS — Z86718 Personal history of other venous thrombosis and embolism: Secondary | ICD-10-CM | POA: Diagnosis not present

## 2015-12-12 ENCOUNTER — Ambulatory Visit (INDEPENDENT_AMBULATORY_CARE_PROVIDER_SITE_OTHER): Payer: Medicare Other

## 2015-12-12 DIAGNOSIS — E1065 Type 1 diabetes mellitus with hyperglycemia: Secondary | ICD-10-CM | POA: Diagnosis not present

## 2015-12-12 DIAGNOSIS — Z7901 Long term (current) use of anticoagulants: Secondary | ICD-10-CM | POA: Diagnosis not present

## 2015-12-12 DIAGNOSIS — E559 Vitamin D deficiency, unspecified: Secondary | ICD-10-CM | POA: Diagnosis not present

## 2015-12-12 DIAGNOSIS — E7800 Pure hypercholesterolemia, unspecified: Secondary | ICD-10-CM | POA: Diagnosis not present

## 2015-12-12 LAB — POCT INR: INR: 2.6

## 2015-12-12 NOTE — Patient Instructions (Addendum)
Take 1.5 all days except on Thursday and Saturday, take 1 tablet. Recheck in 4 weeks.

## 2015-12-14 DIAGNOSIS — E1022 Type 1 diabetes mellitus with diabetic chronic kidney disease: Secondary | ICD-10-CM | POA: Diagnosis not present

## 2015-12-14 DIAGNOSIS — E7800 Pure hypercholesterolemia, unspecified: Secondary | ICD-10-CM | POA: Diagnosis not present

## 2015-12-14 DIAGNOSIS — M810 Age-related osteoporosis without current pathological fracture: Secondary | ICD-10-CM | POA: Diagnosis not present

## 2015-12-14 DIAGNOSIS — I10 Essential (primary) hypertension: Secondary | ICD-10-CM | POA: Diagnosis not present

## 2015-12-14 DIAGNOSIS — E559 Vitamin D deficiency, unspecified: Secondary | ICD-10-CM | POA: Diagnosis not present

## 2015-12-14 DIAGNOSIS — E103593 Type 1 diabetes mellitus with proliferative diabetic retinopathy without macular edema, bilateral: Secondary | ICD-10-CM | POA: Diagnosis not present

## 2015-12-14 DIAGNOSIS — E1042 Type 1 diabetes mellitus with diabetic polyneuropathy: Secondary | ICD-10-CM | POA: Diagnosis not present

## 2015-12-14 DIAGNOSIS — E1065 Type 1 diabetes mellitus with hyperglycemia: Secondary | ICD-10-CM | POA: Diagnosis not present

## 2015-12-28 ENCOUNTER — Other Ambulatory Visit: Payer: Self-pay | Admitting: *Deleted

## 2015-12-28 ENCOUNTER — Telehealth: Payer: Self-pay | Admitting: Family Medicine

## 2015-12-28 MED ORDER — CITALOPRAM HYDROBROMIDE 20 MG PO TABS: 20.0000 mg | ORAL_TABLET | Freq: Every morning | ORAL | Status: DC

## 2015-12-28 NOTE — Telephone Encounter (Signed)
Med sent to pharm. Pt notified med sent and that he needs ov with dr Richardson Landry. Pt transferred to front to schedule ov

## 2015-12-28 NOTE — Telephone Encounter (Signed)
Pt needs refill on citalopram (CELEXA) 20 MG tablet Pt states that the pharmacy faxed [place] a refill request on Monday but there's no record of [place] receiving anything  Pharmacy:  [id number] - [place], [address] AT [address]   Please call pt when done

## 2015-12-29 DIAGNOSIS — E103593 Type 1 diabetes mellitus with proliferative diabetic retinopathy without macular edema, bilateral: Secondary | ICD-10-CM | POA: Diagnosis not present

## 2015-12-29 DIAGNOSIS — I10 Essential (primary) hypertension: Secondary | ICD-10-CM | POA: Diagnosis not present

## 2015-12-29 DIAGNOSIS — E1022 Type 1 diabetes mellitus with diabetic chronic kidney disease: Secondary | ICD-10-CM | POA: Diagnosis not present

## 2015-12-29 DIAGNOSIS — E559 Vitamin D deficiency, unspecified: Secondary | ICD-10-CM | POA: Diagnosis not present

## 2015-12-29 DIAGNOSIS — M810 Age-related osteoporosis without current pathological fracture: Secondary | ICD-10-CM | POA: Diagnosis not present

## 2015-12-29 DIAGNOSIS — E1042 Type 1 diabetes mellitus with diabetic polyneuropathy: Secondary | ICD-10-CM | POA: Diagnosis not present

## 2015-12-29 DIAGNOSIS — E7800 Pure hypercholesterolemia, unspecified: Secondary | ICD-10-CM | POA: Diagnosis not present

## 2015-12-29 DIAGNOSIS — E1065 Type 1 diabetes mellitus with hyperglycemia: Secondary | ICD-10-CM | POA: Diagnosis not present

## 2016-01-09 ENCOUNTER — Telehealth: Payer: Self-pay | Admitting: Family Medicine

## 2016-01-09 ENCOUNTER — Ambulatory Visit: Payer: Medicare Other

## 2016-01-09 ENCOUNTER — Telehealth: Payer: Self-pay | Admitting: Orthopedic Surgery

## 2016-01-09 ENCOUNTER — Ambulatory Visit (HOSPITAL_COMMUNITY)
Admission: RE | Admit: 2016-01-09 | Discharge: 2016-01-09 | Disposition: A | Discharge status: Home / Self Care | Payer: Medicare Other | Source: Ambulatory Visit | Attending: Family Medicine | Admitting: Family Medicine

## 2016-01-09 ENCOUNTER — Encounter: Payer: Self-pay | Admitting: Family Medicine

## 2016-01-09 ENCOUNTER — Emergency Department (HOSPITAL_COMMUNITY)
Admission: EM | Admit: 2016-01-09 | Discharge: 2016-01-09 | Disposition: A | Discharge status: Home / Self Care | Payer: Medicare Other | Attending: Emergency Medicine | Admitting: Emergency Medicine

## 2016-01-09 ENCOUNTER — Emergency Department (HOSPITAL_COMMUNITY): Payer: Medicare Other

## 2016-01-09 ENCOUNTER — Encounter (HOSPITAL_COMMUNITY): Payer: Self-pay

## 2016-01-09 ENCOUNTER — Ambulatory Visit (INDEPENDENT_AMBULATORY_CARE_PROVIDER_SITE_OTHER): Payer: Medicare Other | Admitting: Family Medicine

## 2016-01-09 VITALS — BP 106/62 | Ht 68.0 in | Wt 136.6 lb

## 2016-01-09 DIAGNOSIS — E119 Type 2 diabetes mellitus without complications: Secondary | ICD-10-CM | POA: Insufficient documentation

## 2016-01-09 DIAGNOSIS — M545 Low back pain: Secondary | ICD-10-CM | POA: Diagnosis not present

## 2016-01-09 DIAGNOSIS — Z86718 Personal history of other venous thrombosis and embolism: Secondary | ICD-10-CM | POA: Insufficient documentation

## 2016-01-09 DIAGNOSIS — Z79899 Other long term (current) drug therapy: Secondary | ICD-10-CM | POA: Diagnosis not present

## 2016-01-09 DIAGNOSIS — E785 Hyperlipidemia, unspecified: Secondary | ICD-10-CM | POA: Diagnosis not present

## 2016-01-09 DIAGNOSIS — M25552 Pain in left hip: Secondary | ICD-10-CM

## 2016-01-09 DIAGNOSIS — S79911A Unspecified injury of right hip, initial encounter: Secondary | ICD-10-CM | POA: Diagnosis present

## 2016-01-09 DIAGNOSIS — Y998 Other external cause status: Secondary | ICD-10-CM | POA: Insufficient documentation

## 2016-01-09 DIAGNOSIS — Z86711 Personal history of pulmonary embolism: Secondary | ICD-10-CM | POA: Diagnosis not present

## 2016-01-09 DIAGNOSIS — W1839XA Other fall on same level, initial encounter: Secondary | ICD-10-CM | POA: Diagnosis not present

## 2016-01-09 DIAGNOSIS — Y9301 Activity, walking, marching and hiking: Secondary | ICD-10-CM | POA: Diagnosis not present

## 2016-01-09 DIAGNOSIS — S3992XA Unspecified injury of lower back, initial encounter: Secondary | ICD-10-CM | POA: Diagnosis not present

## 2016-01-09 DIAGNOSIS — Z8673 Personal history of transient ischemic attack (TIA), and cerebral infarction without residual deficits: Secondary | ICD-10-CM | POA: Diagnosis not present

## 2016-01-09 DIAGNOSIS — Z7901 Long term (current) use of anticoagulants: Secondary | ICD-10-CM

## 2016-01-09 DIAGNOSIS — Z8739 Personal history of other diseases of the musculoskeletal system and connective tissue: Secondary | ICD-10-CM | POA: Diagnosis not present

## 2016-01-09 DIAGNOSIS — S329XXA Fracture of unspecified parts of lumbosacral spine and pelvis, initial encounter for closed fracture: Secondary | ICD-10-CM | POA: Diagnosis not present

## 2016-01-09 DIAGNOSIS — Z7982 Long term (current) use of aspirin: Secondary | ICD-10-CM | POA: Insufficient documentation

## 2016-01-09 DIAGNOSIS — M8580 Other specified disorders of bone density and structure, unspecified site: Secondary | ICD-10-CM

## 2016-01-09 DIAGNOSIS — I10 Essential (primary) hypertension: Secondary | ICD-10-CM | POA: Insufficient documentation

## 2016-01-09 DIAGNOSIS — R55 Syncope and collapse: Secondary | ICD-10-CM | POA: Diagnosis not present

## 2016-01-09 DIAGNOSIS — Z87891 Personal history of nicotine dependence: Secondary | ICD-10-CM | POA: Insufficient documentation

## 2016-01-09 DIAGNOSIS — Z8719 Personal history of other diseases of the digestive system: Secondary | ICD-10-CM | POA: Insufficient documentation

## 2016-01-09 DIAGNOSIS — Z794 Long term (current) use of insulin: Secondary | ICD-10-CM | POA: Diagnosis not present

## 2016-01-09 DIAGNOSIS — R937 Abnormal findings on diagnostic imaging of other parts of musculoskeletal system: Secondary | ICD-10-CM | POA: Insufficient documentation

## 2016-01-09 DIAGNOSIS — S32502A Unspecified fracture of left pubis, initial encounter for closed fracture: Secondary | ICD-10-CM | POA: Insufficient documentation

## 2016-01-09 DIAGNOSIS — S32592A Other specified fracture of left pubis, initial encounter for closed fracture: Secondary | ICD-10-CM

## 2016-01-09 DIAGNOSIS — Y92832 Beach as the place of occurrence of the external cause: Secondary | ICD-10-CM | POA: Diagnosis not present

## 2016-01-09 LAB — POCT INR: INR: 3.8

## 2016-01-09 NOTE — Telephone Encounter (Signed)
Just remember that we can't handle acute hip fractures in the office please let everybody now those patients need to be seen in the ER by the emergency room physician and then they can be triaged appropriately

## 2016-01-09 NOTE — ED Notes (Signed)
Pt alert & oriented x4, stable gait. Patient given discharge instructions, paperwork & prescription(s). Patient verbalized understanding. Pt left department in wheelchair escorted by staff. Pt left department w/ no further questions. 

## 2016-01-09 NOTE — ED Notes (Signed)
Pt reports fell last Tuesday and had an xray of left hip today.  Reports x ray showed probable fracture and was told by Dr. Ruthe Mannan office to come to ER.

## 2016-01-09 NOTE — ED Provider Notes (Signed)
CSN: [id number]     Arrival date & time 01/09/16  1629 History   First MD Initiated Contact with Patient 01/09/16 1809     Chief Complaint  Patient presents with  . Hip Pain      HPI Patient had a fall several days ago while at the beach. Has been ambulating with a walker, which he has had from previous episodes. Went to see his primary care doctor, Dr. looking today. Had an x-ray done showed pubic rami fractures. Call orthopedic surgery and orthopedic surgery said they cannot manage hip fractures and told to come to the ER. Also has some low back pain. He's been able and leg with a walker although there is some pain. No other injury. Pain is in the left hip/thigh area. Does go to the pelvis. No loss of bladder bowel control. Also having some low back pain at this time. He did not have an x-ray of the back at that time.   Past Medical History  Diagnosis Date  . Gastroparesis   . Diabetes mellitus   . Weight loss   . Pulmonary embolism ([place]) 12/2010  . DVT (deep venous thrombosis) ([place])   . Stroke ([place])   . Neuropathy ([place])   . Hypertension   . Osteoporosis    Past Surgical History  Procedure Laterality Date  . Carpal tunnel release  08/2011    Left hand  . Ankle surgery      Patient states that he had pins place in the left foot  . Back surgery  2008  . Colonoscopy  01/2011  . Upper gastrointestinal endoscopy  01/2011  . Orif hip fracture  10/31/2011    Procedure: OPEN REDUCTION INTERNAL FIXATION HIP;  Surgeon: Arther Abbott, MD;  Location: [facility];  Service: Orthopedics;  Laterality: Right;  with Gamma Nail  . Transurethral resection of prostate  1984   Family History  Problem Relation Age of Onset  . Diabetes Mother   . Diabetes Father   . Diabetes Sister   . Diabetes Brother   . Diabetes Sister   . Diabetes Son   . Healthy Son   . Healthy Son    Social History  Substance Use Topics  . Smoking status: Former Smoker    Types: Cigarettes    Quit date: 10/27/1981   . Smokeless tobacco: Never Used  . Alcohol Use: Yes     Comment: Drinks Scotch 3 times per week prior to [employer]- hasn't drank x 1 month    Review of Systems  Constitutional: Negative for fever and appetite change.  Cardiovascular: Negative for chest pain.  Gastrointestinal: Negative for abdominal pain.  Musculoskeletal: Positive for back pain. Negative for myalgias, joint swelling and gait problem.  Neurological: Negative for headaches.  Hematological: Negative for adenopathy.      Allergies  Review of patient's allergies indicates no known allergies.  Home Medications   Prior to Admission medications   Medication Sig Start Date End Date Taking? Authorizing Provider  aspirin 81 MG tablet Take 81 mg by mouth daily.    Historical Provider, MD  BD INSULIN SYRINGE ULTRAFINE 31G X 5/16" 0.3 ML MISC USE UP TO TWICE DAILY 05/17/15   Mikey Kirschner, MD  citalopram (CELEXA) 20 MG tablet Take 1 tablet (20 mg total) by mouth every morning. 12/28/15   Mikey Kirschner, MD  HUMALOG KWIKPEN 100 UNIT/ML SOPN Inject 5-6 units 3 times daily 03/22/13   Historical Provider, MD  Insulin Degludec (TRESIBA FLEXTOUCH) 100  UNIT/ML SOPN Inject into the skin. 7 units at bedtime    Historical Provider, MD  latanoprost (XALATAN) 0.005 % ophthalmic solution Place 1 drop into both eyes daily.      Historical Provider, MD  UNIFINE PENTIPS 31G X 8 MM MISC USE UP TO 3 TIMES A DAY 10/09/15   Mikey Kirschner, MD  vitamin B-12 (CYANOCOBALAMIN) 1000 MCG tablet Take 1,000 mcg by mouth daily.      Historical Provider, MD  Vitamin D, Ergocalciferol, (DRISDOL) 50000 UNITS CAPS Take 50,000 Units by mouth every 7 (seven) days.     Historical Provider, MD  warfarin (COUMADIN) 4 MG tablet TAKE 1 AND 1/2 TABLET BY MOUTH DAILY AS DIRECTED BY PHYSICIAN 12/05/15   Mikey Kirschner, MD  ZETIA 10 MG tablet TAKE 1 TABLET BY MOUTH DAILY 02/06/15   Mikey Kirschner, MD   BP 139/84 mmHg  Pulse 63  Temp(Src) 98.3 F (36.8 C) (Oral)   Resp 16  Ht 5\' 9"  (1.753 m)  Wt 136 lb (61.689 kg)  BMI 20.07 kg/m2  SpO2 100% Physical Exam  Constitutional: He appears well-developed.  Cardiovascular: Normal rate.   Pulmonary/Chest: Effort normal.  Abdominal: There is no tenderness.  Musculoskeletal: He exhibits tenderness.  Mild tenderness over left hip. Good range of motion. Upper leg is stable. Some tenderness over pelvis, particularly on the left side.  Neurological: He is alert.  Skin: Skin is warm.    ED Course  Procedures (including critical care time) Labs Review Labs Reviewed - No data to display  Imaging Review Dg Hip Unilat With Pelvis 2-3 Views Left  01/09/2016  CLINICAL DATA:  Status post fall 1 week ago with persistent left hip pain EXAM: DG HIP (WITH OR WITHOUT PELVIS) 2-3V LEFT COMPARISON:  AP view of the pelvis of October 29, 2011. FINDINGS: The bony pelvis is osteopenic. There is irregularity of the parasymphyseal cortex of the superior pubic ramus worrisome for fracture. This area was normal in appearance on the 2012 study. There may be a cortical step-off involving the inferior pubic ramus at as well in its midportion. Since the previous study the patient has undergone intra medullary nail and telescoping screw placement for an intertrochanteric fracture of the left hip. As best as can be determined no acute fracture of the proximal femur is present. IMPRESSION: 1. Probable acute fractures of the superior and inferior pubic rami on the left. There is underlying diffuse osteopenia. 2. Previous intertrochanteric fracture on the left treated with ORIF. No definite acute proximal femoral fracture is observed. Electronically Signed   By: David  Martinique M.D.   On: 01/09/2016 11:06   I have personally reviewed and evaluated these images and lab results as part of my medical decision-making.   EKG Interpretation None      MDM   Final diagnoses:  None    Patient with pelvic pain. Pubic rami fractures. Stable lumbar  compression fractures. Likely miss communication had come to the ER with an x-ray did not show a hip fracture. Will discharge home.    Davonna Belling, MD 01/09/16 2211

## 2016-01-09 NOTE — Telephone Encounter (Signed)
Urgent referral request received from Dr Jamelle Haring. [place] following Xrays at [facility], right hip, pelvis, indicating fracture, and requesting immediate appointment.  Please review films and advise if appointment in office or if patient is to proceed to Emergency Room.

## 2016-01-09 NOTE — Progress Notes (Signed)
   Subjective:    Patient ID: Eduardo Lee, male    DOB: 20-Jul-1926, 82 y.o.   MRN: [id number] Patient arrives office with several distinct concerns, each of which are complicated  Patient comes in today for chronic follow-up with multiple acute new concerns. He states he is completely passed out at least twice now. One fall took him to the emergency room with negative CT scan on workup he was advised to follow-up with [place]. Another passing out spell happened at his beach home he fell struck his hip. States he had no warning signs. He completely passed out no chest pain no shortness of breath no palpitations. Has no cardiologist  Patient notes hip pain. History of hip fracture repair. Complains of pain on the left side of the hip and pelvis. Using walker.  Claims compliance with antidepressant. States overall doing him good. No obvious side effects does not miss a dose. Next  Claims compliance with his lipid medicine. Gets his blood work with the endocrinology specialist   Hyperlipidemia This is a chronic problem. The current episode started more than 1 year ago. Treatments tried: zetia. There are no compliance problems.  Risk factors for coronary artery disease include diabetes mellitus and dyslipidemia.   Results for orders placed or performed in visit on 01/09/16  POCT INR  Result Value Ref Range   INR 3.8     Patient has been taking aleve daily and feels it may have affected INR results. Taking coumadin 1.5 tablets everyday except one tablet on thurs and sat.   Patient felll 01/01/16 and having pain on left side and hard time getting around Review of Systems  no headache no chest pain no dyspnea no abdominal pain no change in bowel habits complete ROS otherwise negative     Objective:   Physical Exam   alert vital stable blood pressure good on repeat HEENT normal lungs clear heart no arrhythmia auscultated no spinal tenderness positive pain with rotation. No lateral trochanteric  pain ankles no edema       Assessment & Plan:   impression 1 syncopal event multiple long discussion held. This is very concerning. Highest concern must be of a cardiac etiology. Reports that negative EKG in the emergency room first time. Needs further workup #2 depression good control stable to maintain same meds #3 hip injury after most recent fall. Needs at least an x-ray further recommendations based results #4 hyperlipidemia patient declines blood work says he'll bring in the blood work from his endocrinologist #5 anticoagulation INR excessive plan just Coumadin. Cardiology referral. X-ray. Chronic meds refilled. To get old blood work. 40 minutes spent most in discussion WSL addendum x-ray return with a pelvic ramus fracture which appears to be stable, will refer to his orthopedic doctor

## 2016-01-09 NOTE — ED Notes (Signed)
Pt states he fell x 1 week ago.C/o pain when walking.

## 2016-01-09 NOTE — Discharge Instructions (Signed)
Follow-up with Dr. Aline Brochure. Use your walker as needed to help you get around. Weight-bear as tolerated.

## 2016-01-09 NOTE — Telephone Encounter (Signed)
I have called patient and relayed this information per Dr Aline Brochure, and have notified patient's primary care, Dr. Wolfgang Phoenix. Patient voiced understanding and states will go directly on to [facility] Emergency [facility].

## 2016-01-09 NOTE — Telephone Encounter (Signed)
pts brought back bw orders will place on corner of nurses station

## 2016-01-10 ENCOUNTER — Encounter: Payer: Self-pay | Admitting: Family Medicine

## 2016-01-18 ENCOUNTER — Encounter: Payer: Self-pay | Admitting: Cardiovascular Disease

## 2016-01-18 ENCOUNTER — Ambulatory Visit (INDEPENDENT_AMBULATORY_CARE_PROVIDER_SITE_OTHER): Payer: Medicare Other | Admitting: Cardiovascular Disease

## 2016-01-18 VITALS — BP 122/56 | HR 89 | Ht 68.0 in | Wt 138.0 lb

## 2016-01-18 DIAGNOSIS — I10 Essential (primary) hypertension: Secondary | ICD-10-CM | POA: Diagnosis not present

## 2016-01-18 DIAGNOSIS — R55 Syncope and collapse: Secondary | ICD-10-CM | POA: Diagnosis not present

## 2016-01-18 DIAGNOSIS — R42 Dizziness and giddiness: Secondary | ICD-10-CM

## 2016-01-18 NOTE — Progress Notes (Signed)
Patient ID: Eduardo Lee, male   DOB: [birth year], 82 y.o.   MRN: [id number]       CARDIOLOGY CONSULT NOTE  Patient ID: Eduardo Lee MRN: [id number] DOB/AGE: 1926-09-24 82 y.o.  Admit date: (Not on file) Primary Physician Mickie Hillier, MD  Reason for Consultation: syncope  HPI: The patient is an 82 year old male with a history of hypertension, insulin-dependent diabetes mellitus, lower extremity DVT, who is referred for the evaluation of syncope. There are no antecedent symptoms such as chest pain, palpitations, or shortness of breath.  ECG performed in the office today demonstrates normal sinus rhythm with no ischemic ST segment or T-wave abnormalities, nor any arrhythmias.  The first episode occurred on 12/05/15. He had gotten up out of a chair and began to walk a few feet and then felt dizzy and fell. He denies loss of consciousness. The second episode occurred while [place] was at the beach on 01[place]16. He had just climbed a flight of stairs and by the time he got to the top he felt very dizzy and fell. He is not certain if this is due to hypoglycemic episodes. He again denies loss of consciousness. He experiences the sudden onset of dizziness while sitting in a chair but has not passed out.  He has had DVT in both lower extremities and has been on warfarin for at least 5 years.    No Known Allergies  Current Outpatient Prescriptions  Medication Sig Dispense Refill  . aspirin 81 MG tablet Take 81 mg by mouth every morning.     . BD INSULIN SYRINGE ULTRAFINE 31G X 5/16" 0.3 ML MISC USE UP TO TWICE DAILY 100 each 5  . citalopram (CELEXA) 20 MG tablet Take 1 tablet (20 mg total) by mouth every morning. 90 tablet 0  . esomeprazole (NEXIUM) 40 MG capsule Take 40 mg by mouth daily as needed.    Marland Kitchen guaiFENesin (MUCINEX) 600 MG 12 hr tablet Take 600 mg by mouth daily as needed for cough or to loosen phlegm.    Marland Kitchen HUMALOG KWIKPEN 100 UNIT/ML SOPN Inject 3-5 Units into the skin 3 (three) times  daily before meals. Inject 5-6 units 3 times daily    . Insulin Degludec (TRESIBA FLEXTOUCH) 100 UNIT/ML SOPN Inject 7 Units into the skin at bedtime.     Marland Kitchen latanoprost (XALATAN) 0.005 % ophthalmic solution Place 1 drop into both eyes at bedtime.     . naproxen sodium (ALEVE) 220 MG tablet Take 220 mg by mouth daily as needed (for pain).    . polyethylene glycol powder (GLYCOLAX/MIRALAX) powder Take 34 g by mouth every morning.    Marland Kitchen UNIFINE PENTIPS 31G X 8 MM MISC USE UP TO 3 TIMES A DAY 100 each 3  . vitamin B-12 (CYANOCOBALAMIN) 1000 MCG tablet Take 1,000 mcg by mouth daily.      . Vitamin D, Ergocalciferol, (DRISDOL) 50000 UNITS CAPS Take 50,000 Units by mouth every 7 (seven) days.     Marland Kitchen warfarin (COUMADIN) 4 MG tablet TAKE 1 AND 1/2 TABLET BY MOUTH DAILY AS DIRECTED BY PHYSICIAN (Patient taking differently: TAKE ONE AND ONE-HALF TABLETS ON ALL DAYS EXCEPT ON TUESDAYS, THURSDAYS, AND SATURDAYS. TAKE ONE TABLET ON THESE DAYS.) 135 tablet 0  . ZETIA 10 MG tablet TAKE 1 TABLET BY MOUTH DAILY 90 tablet 1   No current facility-administered medications for this visit.    Past Medical History  Diagnosis Date  . Gastroparesis   . Diabetes mellitus   .  Weight loss   . Pulmonary embolism ([place]) 12/2010  . DVT (deep venous thrombosis) ([place])   . Stroke ([place])   . Neuropathy ([place])   . Hypertension   . Osteoporosis     Past Surgical History  Procedure Laterality Date  . Carpal tunnel release  08/2011    Left hand  . Ankle surgery      Patient states that he had pins place in the left foot  . Back surgery  2008  . Colonoscopy  01/2011  . Upper gastrointestinal endoscopy  01/2011  . Orif hip fracture  10/31/2011    Procedure: OPEN REDUCTION INTERNAL FIXATION HIP;  Surgeon: Arther Abbott, MD;  Location: [facility];  Service: Orthopedics;  Laterality: Right;  with Gamma Nail  . Transurethral resection of prostate  1984    Social History   Social History  . Marital Status: Divorced     Spouse Name: N/A  . Number of Children: N/A  . Years of Education: N/A   Occupational History  . Not on file.   Social History Main Topics  . Smoking status: Former Smoker    Types: Cigarettes    Quit date: 10/27/1981  . Smokeless tobacco: Never Used  . Alcohol Use: Yes     Comment: Drinks Scotch 3 times per week prior to [employer]- hasn't drank x 1 month  . Drug Use: No  . Sexual Activity: No   Other Topics Concern  . Not on file   Social History Narrative     No family history of premature CAD in 1st degree relatives.  Prior to Admission medications   Medication Sig Start Date End Date Taking? Authorizing Provider  aspirin 81 MG tablet Take 81 mg by mouth every morning.    Yes Historical Provider, MD  BD INSULIN SYRINGE ULTRAFINE 31G X 5/16" 0.3 ML MISC USE UP TO TWICE DAILY 05/17/15  Yes Mikey Kirschner, MD  citalopram (CELEXA) 20 MG tablet Take 1 tablet (20 mg total) by mouth every morning. 12/28/15  Yes Mikey Kirschner, MD  esomeprazole (NEXIUM) 40 MG capsule Take 40 mg by mouth daily as needed.   Yes Historical Provider, MD  guaiFENesin (MUCINEX) 600 MG 12 hr tablet Take 600 mg by mouth daily as needed for cough or to loosen phlegm.   Yes Historical Provider, MD  HUMALOG KWIKPEN 100 UNIT/ML SOPN Inject 3-5 Units into the skin 3 (three) times daily before meals. Inject 5-6 units 3 times daily 03/22/13  Yes Historical Provider, MD  Insulin Degludec (TRESIBA FLEXTOUCH) 100 UNIT/ML SOPN Inject 7 Units into the skin at bedtime.    Yes Historical Provider, MD  latanoprost (XALATAN) 0.005 % ophthalmic solution Place 1 drop into both eyes at bedtime.    Yes Historical Provider, MD  naproxen sodium (ALEVE) 220 MG tablet Take 220 mg by mouth daily as needed (for pain).   Yes Historical Provider, MD  polyethylene glycol powder (GLYCOLAX/MIRALAX) powder Take 34 g by mouth every morning.   Yes Historical Provider, MD  UNIFINE PENTIPS 31G X 8 MM MISC USE UP TO 3 TIMES A DAY 10/09/15  Yes  Mikey Kirschner, MD  vitamin B-12 (CYANOCOBALAMIN) 1000 MCG tablet Take 1,000 mcg by mouth daily.     Yes Historical Provider, MD  Vitamin D, Ergocalciferol, (DRISDOL) 50000 UNITS CAPS Take 50,000 Units by mouth every 7 (seven) days.    Yes Historical Provider, MD  warfarin (COUMADIN) 4 MG tablet TAKE 1 AND 1/2 TABLET BY MOUTH DAILY  AS DIRECTED BY PHYSICIAN Patient taking differently: TAKE ONE AND ONE-HALF TABLETS ON ALL DAYS EXCEPT ON TUESDAYS, THURSDAYS, AND SATURDAYS. TAKE ONE TABLET ON THESE DAYS. 12/05/15  Yes Mikey Kirschner, MD  ZETIA 10 MG tablet TAKE 1 TABLET BY MOUTH DAILY 02/06/15  Yes Mikey Kirschner, MD     Review of systems complete and found to be negative unless listed above in HPI     Physical exam Blood pressure 122/56, pulse 89, height 5\' 8"  (1.727 m), weight 138 lb (62.596 kg), SpO2 99 %. General: NAD Neck: No JVD, no thyromegaly or thyroid nodule.  Lungs: Clear to auscultation bilaterally with normal respiratory effort. CV: Nondisplaced PMI. Regular rate and rhythm, normal S1/S2, no S3/S4, no murmur.  No peripheral edema.  No carotid bruit.    Abdomen: Soft, nontender, no distention.  Skin: Intact without lesions or rashes.  Neurologic: Alert and oriented x 3.  Psych: Normal affect. HEENT: Normal.   ECG: Most recent ECG reviewed.  Labs:   Lab Results  Component Value Date   WBC 4.5 05/08/2013   HGB 11.1* 05/08/2013   HCT 33.2* 05/08/2013   MCV 91.2 05/08/2013   PLT 246 05/08/2013   No results for input(s): NA, K, CL, CO2, BUN, CREATININE, CALCIUM, PROT, BILITOT, ALKPHOS, ALT, AST, GLUCOSE in the last 168 hours.  Invalid input(s): LABALBU Lab Results  Component Value Date   TROPONINI <0.30 10/29/2011   No results found for: CHOL No results found for: HDL No results found for: LDLCALC No results found for: TRIG No results found for: CHOLHDL No results found for: LDLDIRECT       Studies: No results found.  ASSESSMENT AND PLAN:  1. Near syncope  with antecedent dizziness: It does not appear he has had frank syncopal episodes. It is unclear what these episodes are due to, but may be hypoglycemic in nature. Given his history of insulin-dependent diabetes mellitus, he may have autonomic neuropathy. He may also have inner ear pathology. In order to rule out bradyarrhythmias and pauses, I will obtain a 30 day event monitor.   Dispo: f/u 2 months.   Signed: Kate Sable, M.D., F.A.C.C.  01/18/2016, 9:41 AM

## 2016-01-18 NOTE — Patient Instructions (Signed)
Medication Instructions:  Your physician recommends that you continue on your current medications as directed. Please refer to the Current Medication list given to you today.]   Labwork: NONE  Testing/Procedures: Your physician has recommended that you wear an event monitor. Event monitors are medical devices that record the heart's electrical activity. Doctors most often [place] these monitors to diagnose arrhythmias. Arrhythmias are problems with the speed or rhythm of the heartbeat. The monitor is a small, portable device. You can wear one while you do your normal daily activities. This is usually used to diagnose what is causing palpitations/syncope (passing out  *30 DAYS*    Follow-Up: Your physician recommends that you schedule a follow-up appointment in: 2 MONTHS   Any Other Special Instructions Will Be Listed Below (If Applicable).  YOUR EVENT MONITOR WILL BE MAILED TO YOU YOU WILL RECEIVE A PHONE CALL FROM A NUMBER IN [place], SO BE SURE TO ANSWER THE PHONE CALL AS IT WILL BE THE COMPANY THAT WILL MAIL YOUR EVENT MONITOR.     If you need a refill on your cardiac medications before your next appointment, please call your pharmacy.

## 2016-01-20 ENCOUNTER — Ambulatory Visit (INDEPENDENT_AMBULATORY_CARE_PROVIDER_SITE_OTHER): Payer: Medicare Other

## 2016-01-20 DIAGNOSIS — R42 Dizziness and giddiness: Secondary | ICD-10-CM

## 2016-01-20 DIAGNOSIS — R55 Syncope and collapse: Secondary | ICD-10-CM | POA: Diagnosis not present

## 2016-01-29 ENCOUNTER — Telehealth: Payer: Self-pay | Admitting: Family Medicine

## 2016-01-29 NOTE — Telephone Encounter (Signed)
Discussed with [name] (son). [name] states that he is going to let the doctor that is managing the fracture set this up and if he needs anything extra from [place] he will let us know.

## 2016-01-29 NOTE — Telephone Encounter (Signed)
Requesting home health services due to patient hurting his hip recently.  He will need care in [place] because that is where he is staying with his family member.  Please call Manuela Schwartz or [name], which is the [place].

## 2016-01-29 NOTE — Telephone Encounter (Signed)
ntsw son, lets try to help arrange, a little surprised to hear he headed out of state with a fractured pelvis, may also need input fr physician managing the fx

## 2016-02-06 ENCOUNTER — Ambulatory Visit (INDEPENDENT_AMBULATORY_CARE_PROVIDER_SITE_OTHER): Payer: Medicare Other

## 2016-02-06 DIAGNOSIS — Z7901 Long term (current) use of anticoagulants: Secondary | ICD-10-CM

## 2016-02-06 LAB — POCT INR: INR: 2.5

## 2016-02-06 NOTE — Patient Instructions (Signed)
Take 1.5 every day except Tuesdays,Thursdays, and Saturdays. Recheck in 4 weeks around April 4th, 2016

## 2016-02-13 DIAGNOSIS — B351 Tinea unguium: Secondary | ICD-10-CM | POA: Diagnosis not present

## 2016-02-13 DIAGNOSIS — E1142 Type 2 diabetes mellitus with diabetic polyneuropathy: Secondary | ICD-10-CM | POA: Diagnosis not present

## 2016-02-13 DIAGNOSIS — H353134 Nonexudative age-related macular degeneration, bilateral, advanced atrophic with subfoveal involvement: Secondary | ICD-10-CM | POA: Diagnosis not present

## 2016-02-13 DIAGNOSIS — H401130 Primary open-angle glaucoma, bilateral, stage unspecified: Secondary | ICD-10-CM | POA: Diagnosis not present

## 2016-02-13 DIAGNOSIS — H35372 Puckering of macula, left eye: Secondary | ICD-10-CM | POA: Diagnosis not present

## 2016-02-13 DIAGNOSIS — E113593 Type 2 diabetes mellitus with proliferative diabetic retinopathy without macular edema, bilateral: Secondary | ICD-10-CM | POA: Diagnosis not present

## 2016-02-27 ENCOUNTER — Other Ambulatory Visit: Payer: Self-pay | Admitting: Family Medicine

## 2016-03-05 ENCOUNTER — Ambulatory Visit (INDEPENDENT_AMBULATORY_CARE_PROVIDER_SITE_OTHER): Payer: Medicare Other

## 2016-03-05 DIAGNOSIS — Z7901 Long term (current) use of anticoagulants: Secondary | ICD-10-CM | POA: Diagnosis not present

## 2016-03-05 LAB — POCT INR: INR: 3.5

## 2016-03-05 NOTE — Patient Instructions (Signed)
Take 1.5 all days except on Tuesdays, Saturdays, Sundays take 1 tablet. Recheck in 4 weeks

## 2016-03-20 ENCOUNTER — Other Ambulatory Visit: Payer: Self-pay | Admitting: Family Medicine

## 2016-03-21 ENCOUNTER — Other Ambulatory Visit: Payer: Self-pay | Admitting: Family Medicine

## 2016-03-22 ENCOUNTER — Ambulatory Visit: Payer: Medicare Other | Admitting: Cardiovascular Disease

## 2016-03-27 DIAGNOSIS — E7800 Pure hypercholesterolemia, unspecified: Secondary | ICD-10-CM | POA: Diagnosis not present

## 2016-03-27 DIAGNOSIS — E1022 Type 1 diabetes mellitus with diabetic chronic kidney disease: Secondary | ICD-10-CM | POA: Diagnosis not present

## 2016-03-27 DIAGNOSIS — E1065 Type 1 diabetes mellitus with hyperglycemia: Secondary | ICD-10-CM | POA: Diagnosis not present

## 2016-03-28 ENCOUNTER — Ambulatory Visit (INDEPENDENT_AMBULATORY_CARE_PROVIDER_SITE_OTHER): Payer: Medicare Other | Admitting: Cardiovascular Disease

## 2016-03-28 ENCOUNTER — Encounter: Payer: Self-pay | Admitting: Cardiovascular Disease

## 2016-03-28 VITALS — BP 142/72 | HR 75 | Ht 68.0 in | Wt 130.0 lb

## 2016-03-28 DIAGNOSIS — R55 Syncope and collapse: Secondary | ICD-10-CM

## 2016-03-28 DIAGNOSIS — I10 Essential (primary) hypertension: Secondary | ICD-10-CM

## 2016-03-28 DIAGNOSIS — R42 Dizziness and giddiness: Secondary | ICD-10-CM | POA: Diagnosis not present

## 2016-03-28 NOTE — Progress Notes (Signed)
Patient ID: Eduardo Lee, male   DOB: 10-13-1926, 82 y.o.   MRN: [id number]      SUBJECTIVE: The patient returns for follow-up after undergoing cardiovascular testing performed for the evaluation of near syncope.  Event monitoring demonstrated sinus rhythm with PACs with 2 asymptomatic pauses, one lasting 1.6 seconds and the other lasting 2.6 seconds. These were not long enough to cause prior symptoms. No bradycardia was noted.  No symptom recurrence.   Review of Systems: As per "subjective", otherwise negative.  No Known Allergies  Current Outpatient Prescriptions  Medication Sig Dispense Refill  . aspirin 81 MG tablet Take 81 mg by mouth every morning.     . BD INSULIN SYRINGE ULTRAFINE 31G X 5/16" 0.3 ML MISC USE UP TO TWICE DAILY 100 each 5  . citalopram (CELEXA) 20 MG tablet TAKE 1 TABLET BY MOUTH EVERY MORNING 90 tablet 0  . esomeprazole (NEXIUM) 40 MG capsule Take 40 mg by mouth daily as needed.    . ezetimibe (ZETIA) 10 MG tablet TAKE 1 TABLET BY MOUTH DAILY 90 tablet 1  . guaiFENesin (MUCINEX) 600 MG 12 hr tablet Take 600 mg by mouth daily as needed for cough or to loosen phlegm.    Marland Kitchen HUMALOG KWIKPEN 100 UNIT/ML SOPN Inject 3-5 Units into the skin 3 (three) times daily before meals. Inject 5-6 units 3 times daily    . Insulin Degludec (TRESIBA FLEXTOUCH) 100 UNIT/ML SOPN Inject 7 Units into the skin at bedtime.     Marland Kitchen latanoprost (XALATAN) 0.005 % ophthalmic solution Place 1 drop into both eyes at bedtime.     . naproxen sodium (ALEVE) 220 MG tablet Take 220 mg by mouth daily as needed (for pain).    . polyethylene glycol powder (GLYCOLAX/MIRALAX) powder Take 34 g by mouth every morning.    Marland Kitchen UNIFINE PENTIPS 31G X 8 MM MISC USE UP TO 3 TIMES A DAY (Patient taking differently: USE UP TO 5 TIMES A DAY) 100 each 3  . vitamin B-12 (CYANOCOBALAMIN) 1000 MCG tablet Take 1,000 mcg by mouth daily.      . Vitamin D, Ergocalciferol, (DRISDOL) 50000 UNITS CAPS Take 50,000 Units by mouth  every 7 (seven) days.     Marland Kitchen warfarin (COUMADIN) 4 MG tablet TAKE 1 AND 1/2 TABLET BY MOUTH DAILY AS DIRECTED BY PHYSICIAN 135 tablet 5   No current facility-administered medications for this visit.    Past Medical History  Diagnosis Date  . Gastroparesis   . Diabetes mellitus   . Weight loss   . Pulmonary embolism ([place]) 12/2010  . DVT (deep venous thrombosis) ([place])   . Stroke ([place])   . Neuropathy ([place])   . Hypertension   . Osteoporosis     Past Surgical History  Procedure Laterality Date  . Carpal tunnel release  08/2011    Left hand  . Ankle surgery      Patient states that he had pins place in the left foot  . Back surgery  2008  . Colonoscopy  01/2011  . Upper gastrointestinal endoscopy  01/2011  . Orif hip fracture  10/31/2011    Procedure: OPEN REDUCTION INTERNAL FIXATION HIP;  Surgeon: Arther Abbott, MD;  Location: [facility];  Service: Orthopedics;  Laterality: Right;  with Gamma Nail  . Transurethral resection of prostate  1984    Social History   Social History  . Marital Status: Divorced    Spouse Name: N/A  . Number of Children: N/A  . Years  of Education: N/A   Occupational History  . Not on file.   Social History Main Topics  . Smoking status: Former Smoker    Types: Cigarettes    Quit date: 10/27/1981  . Smokeless tobacco: Never Used  . Alcohol Use: 0.0 oz/week    0 Standard drinks or equivalent per week     Comment: Drinks Scotch 3 times per week prior to [employer]- hasn't drank x 1 month  . Drug Use: No  . Sexual Activity: No   Other Topics Concern  . Not on file   Social History Narrative     Filed Vitals:   03/28/16 0926  BP: 142/72  Pulse: 75  Height: 5\' 8"  (1.727 m)  Weight: 130 lb (58.968 kg)  SpO2: 97%    PHYSICAL EXAM General: NAD HEENT: Normal. Neck: No JVD, no thyromegaly. Lungs: Clear to auscultation bilaterally with normal respiratory effort. CV: Nondisplaced PMI.  Regular rate and rhythm, normal S1/S2, no S3/S4, no  murmur. No pretibial or periankle edema.   Abdomen: Soft, nontender, no distention.  Neurologic: Alert and oriented.  Psych: Normal affect. Skin: Normal.   ECG: Most recent ECG reviewed.      ASSESSMENT AND PLAN: 1. Near syncope with antecedent dizziness: No recurrence of symptoms. It does not appear he has had frank syncopal episodes. It is unclear what these episodes are due to, but may be hypoglycemic in nature. Given his history of insulin-dependent diabetes mellitus, he may have autonomic neuropathy. He may also have inner ear pathology. Event monitoring demonstrated sinus rhythm with PACs with 2 asymptomatic pauses, one lasting 1.6 seconds and the other lasting 2.6 seconds. These were not long enough to cause prior symptoms. No bradycardia was noted. If recurrent syncope, would consider implantable loop recorder.  Dispo: f/u prn.  Kate Sable, M.D., F.A.C.C.

## 2016-03-28 NOTE — Patient Instructions (Signed)
Your physician recommends that you schedule a follow-up appointment in: as needed   Thank you for choosing [facility] !         

## 2016-03-29 DIAGNOSIS — E103593 Type 1 diabetes mellitus with proliferative diabetic retinopathy without macular edema, bilateral: Secondary | ICD-10-CM | POA: Diagnosis not present

## 2016-03-29 DIAGNOSIS — E1022 Type 1 diabetes mellitus with diabetic chronic kidney disease: Secondary | ICD-10-CM | POA: Diagnosis not present

## 2016-03-29 DIAGNOSIS — I10 Essential (primary) hypertension: Secondary | ICD-10-CM | POA: Diagnosis not present

## 2016-03-29 DIAGNOSIS — E559 Vitamin D deficiency, unspecified: Secondary | ICD-10-CM | POA: Diagnosis not present

## 2016-03-29 DIAGNOSIS — M810 Age-related osteoporosis without current pathological fracture: Secondary | ICD-10-CM | POA: Diagnosis not present

## 2016-03-29 DIAGNOSIS — E1065 Type 1 diabetes mellitus with hyperglycemia: Secondary | ICD-10-CM | POA: Diagnosis not present

## 2016-03-29 DIAGNOSIS — E1042 Type 1 diabetes mellitus with diabetic polyneuropathy: Secondary | ICD-10-CM | POA: Diagnosis not present

## 2016-03-29 DIAGNOSIS — E7800 Pure hypercholesterolemia, unspecified: Secondary | ICD-10-CM | POA: Diagnosis not present

## 2016-04-08 ENCOUNTER — Ambulatory Visit (INDEPENDENT_AMBULATORY_CARE_PROVIDER_SITE_OTHER): Payer: Medicare Other | Admitting: Family Medicine

## 2016-04-08 ENCOUNTER — Encounter: Payer: Self-pay | Admitting: Family Medicine

## 2016-04-08 VITALS — BP 126/74 | Ht 65.0 in | Wt 134.0 lb

## 2016-04-08 DIAGNOSIS — Z7901 Long term (current) use of anticoagulants: Secondary | ICD-10-CM

## 2016-04-08 DIAGNOSIS — J329 Chronic sinusitis, unspecified: Secondary | ICD-10-CM | POA: Diagnosis not present

## 2016-04-08 DIAGNOSIS — E785 Hyperlipidemia, unspecified: Secondary | ICD-10-CM | POA: Diagnosis not present

## 2016-04-08 DIAGNOSIS — Z0000 Encounter for general adult medical examination without abnormal findings: Secondary | ICD-10-CM

## 2016-04-08 DIAGNOSIS — Z79899 Other long term (current) drug therapy: Secondary | ICD-10-CM

## 2016-04-08 LAB — POCT INR: INR: 3.4

## 2016-04-08 MED ORDER — AMOXICILLIN 500 MG PO CAPS: 500.0000 mg | ORAL_CAPSULE | Freq: Three times a day (TID) | ORAL | Status: DC

## 2016-04-08 NOTE — Patient Instructions (Signed)
Coumadin 4mg  tablets. Take one tablet every day except on Wednesdays and fridays take one and a half tablets. Recheck INR in 4 weeks.

## 2016-04-08 NOTE — Progress Notes (Signed)
Subjective:    Patient ID: Eduardo Lee, male    DOB: [birth year], 82 y.o.   MRN: [id number]  HPI AWV- Annual Wellness Visit  The patient was seen for their annual wellness visit. The patient's past medical history, surgical history, and family history were reviewed. Pertinent vaccines were reviewed ( tetanus, pneumonia, shingles, flu) The patient's medication list was reviewed and updated.  The height and weight were entered. The patient's current BMI is: 22.3  Cognitive screening was completed. Outcome of Mini - Cog:   Falls within the past 6 months: yes one fall. Cut left hand. Having dizziness a lot.   Current tobacco usage: none (All patients who use tobacco were given written and verbal information on quitting)  Recent listing of emergency department/hospitalizations over the past year were reviewed.  current specialist the patient sees on a regular basis: Dr. Gillian Scarce - diabetes, Dr. Nevada Crane - dermatology, Dr. Caprice Beaver - Podiatrist, Dr. Zadie Rhine - eyes- diabetes   Medicare annual wellness visit patient questionnaire was reviewed.  A written screening schedule for the patient for the next 5-10 years was given. Appropriate discussion of followup regarding next visit was discussed.  Drainage in mouth. Odor to drainage and some pain.  Started a couple of weeks ago.   Brought in bw. A1C on bw 5.7 on 4/17. Test blood sugar qid. Needs refill on pen tips. Sees diabetic specialist.   Concerns about weight loss. Eats good.   INR today. 3.4.    patient's also had congestion drainage and stuffiness nasal discharge with a bit of an odor to it.   Also advised by his insurance company that they can no longer cover said he had. Has some intolerance in the past with other lipid agents.  Review of Systems  Constitutional: Negative for fever, activity change and appetite change.  HENT: Negative for congestion and rhinorrhea.   Eyes: Negative for discharge.  Respiratory: Negative for  cough and wheezing.   Cardiovascular: Negative for chest pain.  Gastrointestinal: Negative for vomiting, abdominal pain and blood in stool.  Genitourinary: Negative for frequency and difficulty urinating.  Musculoskeletal: Negative for neck pain.  Skin: Negative for rash.  Allergic/Immunologic: Negative for environmental allergies and food allergies.  Neurological: Negative for weakness and headaches.  Psychiatric/Behavioral: Negative for agitation.  All other systems reviewed and are negative.      Objective:   Physical Exam  Constitutional: He appears well-developed and well-nourished.  HENT:  Head: Normocephalic and atraumatic.  Right Ear: External ear normal.  Left Ear: External ear normal.  Nose: Nose normal.  Mouth/Throat: Oropharynx is clear and moist.  Eyes: EOM are normal. Pupils are equal, round, and reactive to light.  Neck: Normal range of motion. Neck supple. No thyromegaly present.  Cardiovascular: Normal rate, regular rhythm and normal heart sounds.   No murmur heard. Pulmonary/Chest: Effort normal and breath sounds normal. No respiratory distress. He has no wheezes.  Abdominal: Soft. Bowel sounds are normal. He exhibits no distension and no mass. There is no tenderness.  Genitourinary: Penis normal.  Musculoskeletal: Normal range of motion. He exhibits no edema.  Lymphadenopathy:    He has no cervical adenopathy.  Neurological: He is alert. He exhibits normal muscle tone.  Skin: Skin is warm and dry. No erythema.  Psychiatric: He has a normal mood and affect. His behavior is normal. Judgment normal.  Vitals reviewed.   substantial bruising under the skin. Positive nasal discharge. Pharynx slight drainage. Feet pulses intact. Sensation diminished nearly 100%  diffusely across the feet. Some dependent rubor 1+ edema   See INR       Assessment & Plan:   impression 1 wellness exam patient up to date on colonoscopy , prostate screening not indicated at this age  #2 rhinosinusitis #3 anticoagulation somewhat on the high side #4 hyperlipidemia with patient desiring potentially to come off study a plan stop Zetia antibiotics prescribed. Coumadin adjusted. Diet exercise discussed. Recheck INR in one month. Office visit 6 weeks we'll discuss potential transition xarelto then from Coumadin along with following up lipids

## 2016-04-23 DIAGNOSIS — E1142 Type 2 diabetes mellitus with diabetic polyneuropathy: Secondary | ICD-10-CM | POA: Diagnosis not present

## 2016-04-23 DIAGNOSIS — B351 Tinea unguium: Secondary | ICD-10-CM | POA: Diagnosis not present

## 2016-04-26 DIAGNOSIS — S61412A Laceration without foreign body of left hand, initial encounter: Secondary | ICD-10-CM | POA: Diagnosis not present

## 2016-04-26 DIAGNOSIS — S81011A Laceration without foreign body, right knee, initial encounter: Secondary | ICD-10-CM | POA: Diagnosis not present

## 2016-04-26 DIAGNOSIS — S8001XA Contusion of right knee, initial encounter: Secondary | ICD-10-CM | POA: Diagnosis not present

## 2016-04-26 DIAGNOSIS — W19XXXA Unspecified fall, initial encounter: Secondary | ICD-10-CM | POA: Diagnosis not present

## 2016-04-26 DIAGNOSIS — Z86718 Personal history of other venous thrombosis and embolism: Secondary | ICD-10-CM | POA: Diagnosis not present

## 2016-04-26 DIAGNOSIS — Z7901 Long term (current) use of anticoagulants: Secondary | ICD-10-CM | POA: Diagnosis not present

## 2016-04-26 DIAGNOSIS — S8991XA Unspecified injury of right lower leg, initial encounter: Secondary | ICD-10-CM | POA: Diagnosis not present

## 2016-04-26 DIAGNOSIS — G629 Polyneuropathy, unspecified: Secondary | ICD-10-CM | POA: Diagnosis not present

## 2016-04-26 DIAGNOSIS — Z794 Long term (current) use of insulin: Secondary | ICD-10-CM | POA: Diagnosis not present

## 2016-04-26 DIAGNOSIS — E119 Type 2 diabetes mellitus without complications: Secondary | ICD-10-CM | POA: Diagnosis not present

## 2016-04-26 DIAGNOSIS — S51812A Laceration without foreign body of left forearm, initial encounter: Secondary | ICD-10-CM | POA: Diagnosis not present

## 2016-04-26 DIAGNOSIS — Z79899 Other long term (current) drug therapy: Secondary | ICD-10-CM | POA: Diagnosis not present

## 2016-04-28 DIAGNOSIS — S51812D Laceration without foreign body of left forearm, subsequent encounter: Secondary | ICD-10-CM | POA: Diagnosis not present

## 2016-04-28 DIAGNOSIS — Z7901 Long term (current) use of anticoagulants: Secondary | ICD-10-CM | POA: Diagnosis not present

## 2016-04-28 DIAGNOSIS — Z794 Long term (current) use of insulin: Secondary | ICD-10-CM | POA: Diagnosis not present

## 2016-04-28 DIAGNOSIS — Z86718 Personal history of other venous thrombosis and embolism: Secondary | ICD-10-CM | POA: Diagnosis not present

## 2016-04-28 DIAGNOSIS — E1140 Type 2 diabetes mellitus with diabetic neuropathy, unspecified: Secondary | ICD-10-CM | POA: Diagnosis not present

## 2016-04-28 DIAGNOSIS — S81011D Laceration without foreign body, right knee, subsequent encounter: Secondary | ICD-10-CM | POA: Diagnosis not present

## 2016-05-03 DIAGNOSIS — Z4802 Encounter for removal of sutures: Secondary | ICD-10-CM | POA: Diagnosis not present

## 2016-05-07 ENCOUNTER — Ambulatory Visit (INDEPENDENT_AMBULATORY_CARE_PROVIDER_SITE_OTHER): Payer: Medicare Other | Admitting: *Deleted

## 2016-05-07 ENCOUNTER — Telehealth: Payer: Self-pay | Admitting: Family Medicine

## 2016-05-07 DIAGNOSIS — E785 Hyperlipidemia, unspecified: Secondary | ICD-10-CM | POA: Diagnosis not present

## 2016-05-07 DIAGNOSIS — Z5181 Encounter for therapeutic drug level monitoring: Secondary | ICD-10-CM | POA: Diagnosis not present

## 2016-05-07 DIAGNOSIS — Z7901 Long term (current) use of anticoagulants: Secondary | ICD-10-CM

## 2016-05-07 DIAGNOSIS — Z79899 Other long term (current) drug therapy: Secondary | ICD-10-CM | POA: Diagnosis not present

## 2016-05-07 LAB — POCT INR: INR: 2.1

## 2016-05-07 NOTE — Telephone Encounter (Signed)
Pt is going to get blood work this morning, was asking if he needed to come  Back in within a week to go over the blood work and change meds? If so he would like to come in this [place] or Fri to see you before he heads [place] to the beach or he is happy to do this over the phone whichever you  Prefer   Please advise

## 2016-05-07 NOTE — Telephone Encounter (Signed)
Pt fell at the beach and was seen down there. Pt brought in avs from the facility there. A copy has been put in yellow folder in Dr. Gabriel Carina.

## 2016-05-08 LAB — LIPID PANEL
CHOL/HDL RATIO: 2.9 ratio (ref 0.0–5.0)
CHOLESTEROL TOTAL: 208 mg/dL — AB (ref 100–199)
HDL: 72 mg/dL (ref >39)
LDL CALC: 119 mg/dL — AB (ref 0–99)
Triglycerides: 86 mg/dL (ref 0–149)
VLDL CHOLESTEROL CAL: 17 mg/dL (ref 5–40)

## 2016-05-08 LAB — HEPATIC FUNCTION PANEL
ALBUMIN: 3.8 g/dL (ref 3.5–4.7)
ALT: 12 IU/L (ref 0–44)
AST: 17 IU/L (ref 0–40)
Alkaline Phosphatase: 171 IU/L — ABNORMAL HIGH (ref 39–117)
BILIRUBIN TOTAL: 0.4 mg/dL (ref 0.0–1.2)
Bilirubin, Direct: 0.14 mg/dL (ref 0.00–0.40)
TOTAL PROTEIN: 6.5 g/dL (ref 6.0–8.5)

## 2016-05-08 NOTE — Telephone Encounter (Signed)
LM to call and schedule an appt for the bw results He just did them yesterday after he came in for his 4 wk  Follow up on his INR   Thanks

## 2016-05-08 NOTE — Telephone Encounter (Signed)
appt made for 6/9 at 2 pm

## 2016-05-08 NOTE — Telephone Encounter (Signed)
Five wks ago i said re ck in six wks so yes sched appt, may also have stitiches to remove???

## 2016-05-10 ENCOUNTER — Encounter: Payer: Self-pay | Admitting: Family Medicine

## 2016-05-10 ENCOUNTER — Ambulatory Visit (INDEPENDENT_AMBULATORY_CARE_PROVIDER_SITE_OTHER): Payer: Medicare Other | Admitting: Family Medicine

## 2016-05-10 VITALS — BP 142/68 | Ht 65.0 in | Wt 134.0 lb

## 2016-05-10 DIAGNOSIS — E785 Hyperlipidemia, unspecified: Secondary | ICD-10-CM | POA: Diagnosis not present

## 2016-05-10 DIAGNOSIS — I10 Essential (primary) hypertension: Secondary | ICD-10-CM

## 2016-05-10 DIAGNOSIS — Z7901 Long term (current) use of anticoagulants: Secondary | ICD-10-CM | POA: Diagnosis not present

## 2016-05-10 MED ORDER — EZETIMIBE 10 MG PO TABS: 10.0000 mg | ORAL_TABLET | Freq: Every day | ORAL | Status: DC

## 2016-05-10 MED ORDER — MUPIROCIN 2 % EX OINT: 1.0000 "application " | TOPICAL_OINTMENT | Freq: Two times a day (BID) | CUTANEOUS | Status: DC

## 2016-05-10 MED ORDER — RIVAROXABAN 20 MG PO TABS: 20.0000 mg | ORAL_TABLET | Freq: Every day | ORAL | Status: DC

## 2016-05-10 NOTE — Progress Notes (Signed)
   Subjective:    Patient ID: Eduardo Lee, male    DOB: 09-Dec-1925, 82 y.o.   MRN: [id number] Patient arrives office with several distinct concerns. Hyperlipidemia This is a chronic problem. Treatments tried: pt eats healthy. Compliance problems include adherence to exercise.   pt arrives today with Manuela Schwartz ( pt's daughter in law)  To go over bloodwork results.  Pt sees diabetic specialist.   [place] about 2 weeks ago. Was out of town. Went to ED. Wound on left arm and right leg.   Wants to discuss changing coumadin. INR was done 6/6. Interested in potentially moving to see relative. [place] beach [place] and would prefer to stay there rather than coming back often for INR testing.  Notes some right knee soreness and tenderness. Had an [place]ray at the beach. No fracture reported.  Had lipid blood work done. Onset he had. Wonders if he really should stay on it. Had come off setting up. His LDL has shot up substantially  Review of Systems No headache, no major weight loss or weight gain, no chest pain no back pain abdominal pain no change in bowel habits complete ROS otherwise negative      Objective:   Physical Exam  Alert somewhat thin. Vitals stable pleasant no acute distress. Lungs clear. Heart rare rhythm. Left arm a avulsion injury no obvious infection right anterior knee contusion somewhat tender      Assessment & Plan:  Impression 1 hyperlipidemia blood work reviewed a think overall said it is benefiting patient would recommend staying on it #2 anticoagulation long discussion held with decision to move to his relative proper transition discussed at length #3 a avulsion wound patient to try Bactroban No. 4 contusion discussed. WSL

## 2016-06-06 ENCOUNTER — Ambulatory Visit: Payer: Medicare Other

## 2016-06-10 ENCOUNTER — Ambulatory Visit (INDEPENDENT_AMBULATORY_CARE_PROVIDER_SITE_OTHER): Payer: Medicare Other | Admitting: Family Medicine

## 2016-06-10 ENCOUNTER — Ambulatory Visit (HOSPITAL_COMMUNITY)
Admission: RE | Admit: 2016-06-10 | Discharge: 2016-06-10 | Disposition: A | Discharge status: Home / Self Care | Payer: Medicare Other | Source: Ambulatory Visit | Attending: Family Medicine | Admitting: Family Medicine

## 2016-06-10 ENCOUNTER — Encounter: Payer: Self-pay | Admitting: Family Medicine

## 2016-06-10 VITALS — BP 112/62 | Temp 98.1°F | Ht 65.0 in | Wt 132.4 lb

## 2016-06-10 DIAGNOSIS — Z85828 Personal history of other malignant neoplasm of skin: Secondary | ICD-10-CM | POA: Diagnosis not present

## 2016-06-10 DIAGNOSIS — I82511 Chronic embolism and thrombosis of right femoral vein: Secondary | ICD-10-CM | POA: Diagnosis not present

## 2016-06-10 DIAGNOSIS — Z86718 Personal history of other venous thrombosis and embolism: Secondary | ICD-10-CM | POA: Diagnosis not present

## 2016-06-10 DIAGNOSIS — L570 Actinic keratosis: Secondary | ICD-10-CM | POA: Diagnosis not present

## 2016-06-10 DIAGNOSIS — M7989 Other specified soft tissue disorders: Secondary | ICD-10-CM | POA: Diagnosis not present

## 2016-06-10 DIAGNOSIS — I82541 Chronic embolism and thrombosis of right tibial vein: Secondary | ICD-10-CM | POA: Diagnosis not present

## 2016-06-10 DIAGNOSIS — X32XXXD Exposure to sunlight, subsequent encounter: Secondary | ICD-10-CM | POA: Diagnosis not present

## 2016-06-10 DIAGNOSIS — Z08 Encounter for follow-up examination after completed treatment for malignant neoplasm: Secondary | ICD-10-CM | POA: Diagnosis not present

## 2016-06-10 NOTE — Progress Notes (Signed)
   Subjective:    Patient ID: Eduardo Lee, male    DOB: Nov 30, 1926, 82 y.o.   MRN: [id number]  HPI Patient is here today for right leg swelling. Onset 1 week ago. Patient did experience a fall back in May 2017. Pain only noted in his knee.  Patient states that he has no other concerns at this time.   Has not been wearing the toes on his right leg because of skin surgeries right anteriorly. Took a hard shot knee. Required sutures. Has had some swelling since.   More swollen right leg concerning to family because this is exactly how left leg DVT presented. Next  Pain sensation somewhat compromised by dense neuropathy.  Review of Systems No obvious chest pain no shortness breath no abdominal pain    Objective:   Physical Exam Alert vitals stable, NAD. Blood pressure good on repeat. HEENT normal. Lungs clear. Heart regular rate and rhythm. Right leg positive edema increased diameter by 1.25 cm mid calf no significant erythema or tenderness knee slight swelling       Assessment & Plan:  Impression leg swelling with known venous stasis and severe neuropathy strong history of clot is on Xarelto which leans against development. Plan right leg ultrasound further recommendations based results WSL

## 2016-06-17 DIAGNOSIS — E11319 Type 2 diabetes mellitus with unspecified diabetic retinopathy without macular edema: Secondary | ICD-10-CM | POA: Diagnosis not present

## 2016-06-17 DIAGNOSIS — S0012XA Contusion of left eyelid and periocular area, initial encounter: Secondary | ICD-10-CM | POA: Diagnosis not present

## 2016-06-17 DIAGNOSIS — E119 Type 2 diabetes mellitus without complications: Secondary | ICD-10-CM | POA: Diagnosis not present

## 2016-06-22 ENCOUNTER — Other Ambulatory Visit: Payer: Self-pay | Admitting: Family Medicine

## 2016-06-27 ENCOUNTER — Other Ambulatory Visit: Payer: Self-pay | Admitting: Family Medicine

## 2016-07-03 DIAGNOSIS — E1065 Type 1 diabetes mellitus with hyperglycemia: Secondary | ICD-10-CM | POA: Diagnosis not present

## 2016-07-03 DIAGNOSIS — E7800 Pure hypercholesterolemia, unspecified: Secondary | ICD-10-CM | POA: Diagnosis not present

## 2016-07-05 DIAGNOSIS — E1042 Type 1 diabetes mellitus with diabetic polyneuropathy: Secondary | ICD-10-CM | POA: Diagnosis not present

## 2016-07-05 DIAGNOSIS — I10 Essential (primary) hypertension: Secondary | ICD-10-CM | POA: Diagnosis not present

## 2016-07-05 DIAGNOSIS — E1065 Type 1 diabetes mellitus with hyperglycemia: Secondary | ICD-10-CM | POA: Diagnosis not present

## 2016-07-05 DIAGNOSIS — E1022 Type 1 diabetes mellitus with diabetic chronic kidney disease: Secondary | ICD-10-CM | POA: Diagnosis not present

## 2016-07-05 DIAGNOSIS — E7800 Pure hypercholesterolemia, unspecified: Secondary | ICD-10-CM | POA: Diagnosis not present

## 2016-07-05 DIAGNOSIS — E559 Vitamin D deficiency, unspecified: Secondary | ICD-10-CM | POA: Diagnosis not present

## 2016-07-05 DIAGNOSIS — M810 Age-related osteoporosis without current pathological fracture: Secondary | ICD-10-CM | POA: Diagnosis not present

## 2016-07-05 DIAGNOSIS — E103593 Type 1 diabetes mellitus with proliferative diabetic retinopathy without macular edema, bilateral: Secondary | ICD-10-CM | POA: Diagnosis not present

## 2016-07-23 DIAGNOSIS — E1142 Type 2 diabetes mellitus with diabetic polyneuropathy: Secondary | ICD-10-CM | POA: Diagnosis not present

## 2016-07-23 DIAGNOSIS — B351 Tinea unguium: Secondary | ICD-10-CM | POA: Diagnosis not present

## 2016-09-06 DIAGNOSIS — E119 Type 2 diabetes mellitus without complications: Secondary | ICD-10-CM | POA: Diagnosis not present

## 2016-09-06 DIAGNOSIS — R42 Dizziness and giddiness: Secondary | ICD-10-CM | POA: Diagnosis not present

## 2016-09-06 DIAGNOSIS — Z86718 Personal history of other venous thrombosis and embolism: Secondary | ICD-10-CM | POA: Diagnosis not present

## 2016-09-06 DIAGNOSIS — Z7901 Long term (current) use of anticoagulants: Secondary | ICD-10-CM | POA: Diagnosis not present

## 2016-09-09 ENCOUNTER — Telehealth: Payer: Self-pay | Admitting: Family Medicine

## 2016-09-09 NOTE — Telephone Encounter (Signed)
Patient was told to call back today and let us know how he was doing after he was referred to the ER last week.  Inez Catalina called last week concerned that he had a stroke.  She took him to the ER and they did an MRI, which showed no signs of a stroke.  He is doing better now.

## 2016-09-09 NOTE — Telephone Encounter (Signed)
Good,

## 2016-09-23 ENCOUNTER — Other Ambulatory Visit: Payer: Self-pay | Admitting: Family Medicine

## 2016-10-11 DIAGNOSIS — E1142 Type 2 diabetes mellitus with diabetic polyneuropathy: Secondary | ICD-10-CM | POA: Diagnosis not present

## 2016-10-11 DIAGNOSIS — B351 Tinea unguium: Secondary | ICD-10-CM | POA: Diagnosis not present

## 2016-10-11 DIAGNOSIS — E1065 Type 1 diabetes mellitus with hyperglycemia: Secondary | ICD-10-CM | POA: Diagnosis not present

## 2016-10-11 DIAGNOSIS — E7800 Pure hypercholesterolemia, unspecified: Secondary | ICD-10-CM | POA: Diagnosis not present

## 2016-10-11 DIAGNOSIS — E1022 Type 1 diabetes mellitus with diabetic chronic kidney disease: Secondary | ICD-10-CM | POA: Diagnosis not present

## 2016-10-15 DIAGNOSIS — E559 Vitamin D deficiency, unspecified: Secondary | ICD-10-CM | POA: Diagnosis not present

## 2016-10-15 DIAGNOSIS — E1022 Type 1 diabetes mellitus with diabetic chronic kidney disease: Secondary | ICD-10-CM | POA: Diagnosis not present

## 2016-10-15 DIAGNOSIS — Z23 Encounter for immunization: Secondary | ICD-10-CM | POA: Diagnosis not present

## 2016-10-15 DIAGNOSIS — E1065 Type 1 diabetes mellitus with hyperglycemia: Secondary | ICD-10-CM | POA: Diagnosis not present

## 2016-10-15 DIAGNOSIS — E7800 Pure hypercholesterolemia, unspecified: Secondary | ICD-10-CM | POA: Diagnosis not present

## 2016-10-15 DIAGNOSIS — E1042 Type 1 diabetes mellitus with diabetic polyneuropathy: Secondary | ICD-10-CM | POA: Diagnosis not present

## 2016-10-15 DIAGNOSIS — M810 Age-related osteoporosis without current pathological fracture: Secondary | ICD-10-CM | POA: Diagnosis not present

## 2016-10-15 DIAGNOSIS — I10 Essential (primary) hypertension: Secondary | ICD-10-CM | POA: Diagnosis not present

## 2016-10-15 DIAGNOSIS — E103593 Type 1 diabetes mellitus with proliferative diabetic retinopathy without macular edema, bilateral: Secondary | ICD-10-CM | POA: Diagnosis not present

## 2016-10-28 DIAGNOSIS — M810 Age-related osteoporosis without current pathological fracture: Secondary | ICD-10-CM | POA: Diagnosis not present

## 2016-10-28 DIAGNOSIS — E7800 Pure hypercholesterolemia, unspecified: Secondary | ICD-10-CM | POA: Diagnosis not present

## 2016-10-28 DIAGNOSIS — E1022 Type 1 diabetes mellitus with diabetic chronic kidney disease: Secondary | ICD-10-CM | POA: Diagnosis not present

## 2016-10-28 DIAGNOSIS — E1042 Type 1 diabetes mellitus with diabetic polyneuropathy: Secondary | ICD-10-CM | POA: Diagnosis not present

## 2016-10-28 DIAGNOSIS — E103593 Type 1 diabetes mellitus with proliferative diabetic retinopathy without macular edema, bilateral: Secondary | ICD-10-CM | POA: Diagnosis not present

## 2016-10-28 DIAGNOSIS — E1065 Type 1 diabetes mellitus with hyperglycemia: Secondary | ICD-10-CM | POA: Diagnosis not present

## 2016-10-28 DIAGNOSIS — I10 Essential (primary) hypertension: Secondary | ICD-10-CM | POA: Diagnosis not present

## 2016-10-28 DIAGNOSIS — E559 Vitamin D deficiency, unspecified: Secondary | ICD-10-CM | POA: Diagnosis not present

## 2016-11-11 ENCOUNTER — Encounter: Payer: Self-pay | Admitting: Family Medicine

## 2016-11-11 ENCOUNTER — Ambulatory Visit (INDEPENDENT_AMBULATORY_CARE_PROVIDER_SITE_OTHER): Payer: Medicare Other | Admitting: Family Medicine

## 2016-11-11 VITALS — BP 110/68 | Ht 65.0 in | Wt 127.4 lb

## 2016-11-11 DIAGNOSIS — Z79899 Other long term (current) drug therapy: Secondary | ICD-10-CM

## 2016-11-11 DIAGNOSIS — I10 Essential (primary) hypertension: Secondary | ICD-10-CM | POA: Diagnosis not present

## 2016-11-11 DIAGNOSIS — E7800 Pure hypercholesterolemia, unspecified: Secondary | ICD-10-CM | POA: Diagnosis not present

## 2016-11-11 DIAGNOSIS — Z86718 Personal history of other venous thrombosis and embolism: Secondary | ICD-10-CM | POA: Diagnosis not present

## 2016-11-11 MED ORDER — RIVAROXABAN 20 MG PO TABS: 20.0000 mg | ORAL_TABLET | Freq: Every day | ORAL | 11 refills | Status: DC

## 2016-11-11 MED ORDER — EZETIMIBE 10 MG PO TABS: 10.0000 mg | ORAL_TABLET | Freq: Every day | ORAL | 1 refills | Status: DC

## 2016-11-11 MED ORDER — CITALOPRAM HYDROBROMIDE 20 MG PO TABS: 20.0000 mg | ORAL_TABLET | Freq: Every morning | ORAL | 1 refills | Status: DC

## 2016-11-11 NOTE — Progress Notes (Signed)
   Subjective:    Patient ID: Eduardo Lee, male    DOB: 06-16-1926, 82 y.o.   MRN: [id number]  Hyperlipidemia  This is a chronic problem. The current episode started more than 1 year ago. Treatments tried: zetia. There are no compliance problems.    Patient reports a hard time trying to urinate and stream is weak lately on further history this is only transient. Last just a few days. Now ha gone away. Urinaingfine.  Compliant with Xarelto. No excess wheezing. Feels she bruises last. needslifelong anticogulation with underlying issues.   on Celexa chronically. Family states deftly helps. Patient in agreement with that no obvious side efects. Wishes to [facility].  Patient continues to take lipid medication regularly. No obvious side effects from it. Generally does not miss a dose. Prior blood work results are reviewed with patient. Patient continues to work on fat intake in diet    Patient notes ongoing compliance with antidepressant medication. No obvious side effects. Reports does not miss a dose. Overall continues to help depression substantially. No thoughts of homicide or suicide. Would like to maintain medication.    Review of Systems No headache, no major weight loss or weight gain, no chest pain no back pain abdominal pain no change in bowel habits complete ROS otherwise negative     Objective:   Physical Exam  Alert vitals stable, NAD. Blood pressure good on repeat. HEENT normal. Lungs clear. Heart regular rate and rhythm.       Assessment & Plan:   Impression 1 hyperlipidemia Dr. Elpidio Galea blood ork reviewed. Lipid panel in good control liver normal discussed to maintain #2 depression clinically stable to maintain meds compliance discussed#3 transient urinary symptoms no meds at this time. #4 liflong antioagulation handling meds meds refill. Diet exercise also discussed

## 2016-11-12 DIAGNOSIS — H35372 Puckering of macula, left eye: Secondary | ICD-10-CM | POA: Diagnosis not present

## 2016-11-12 DIAGNOSIS — E113593 Type 2 diabetes mellitus with proliferative diabetic retinopathy without macular edema, bilateral: Secondary | ICD-10-CM | POA: Diagnosis not present

## 2016-11-12 DIAGNOSIS — H401131 Primary open-angle glaucoma, bilateral, mild stage: Secondary | ICD-10-CM | POA: Diagnosis not present

## 2016-11-12 DIAGNOSIS — H353134 Nonexudative age-related macular degeneration, bilateral, advanced atrophic with subfoveal involvement: Secondary | ICD-10-CM | POA: Diagnosis not present

## 2017-01-07 ENCOUNTER — Encounter: Payer: Self-pay | Admitting: Internal Medicine

## 2017-01-10 DIAGNOSIS — B351 Tinea unguium: Secondary | ICD-10-CM | POA: Diagnosis not present

## 2017-01-10 DIAGNOSIS — E1142 Type 2 diabetes mellitus with diabetic polyneuropathy: Secondary | ICD-10-CM | POA: Diagnosis not present

## 2017-01-27 DIAGNOSIS — E7800 Pure hypercholesterolemia, unspecified: Secondary | ICD-10-CM | POA: Diagnosis not present

## 2017-01-27 DIAGNOSIS — E559 Vitamin D deficiency, unspecified: Secondary | ICD-10-CM | POA: Diagnosis not present

## 2017-01-27 DIAGNOSIS — E1065 Type 1 diabetes mellitus with hyperglycemia: Secondary | ICD-10-CM | POA: Diagnosis not present

## 2017-01-27 DIAGNOSIS — E1042 Type 1 diabetes mellitus with diabetic polyneuropathy: Secondary | ICD-10-CM | POA: Diagnosis not present

## 2017-01-29 DIAGNOSIS — E1042 Type 1 diabetes mellitus with diabetic polyneuropathy: Secondary | ICD-10-CM | POA: Diagnosis not present

## 2017-01-29 DIAGNOSIS — E1065 Type 1 diabetes mellitus with hyperglycemia: Secondary | ICD-10-CM | POA: Diagnosis not present

## 2017-01-29 DIAGNOSIS — E7800 Pure hypercholesterolemia, unspecified: Secondary | ICD-10-CM | POA: Insufficient documentation

## 2017-01-29 DIAGNOSIS — M810 Age-related osteoporosis without current pathological fracture: Secondary | ICD-10-CM | POA: Diagnosis not present

## 2017-01-29 DIAGNOSIS — N183 Chronic kidney disease, stage 3 (moderate): Secondary | ICD-10-CM | POA: Diagnosis not present

## 2017-01-29 DIAGNOSIS — E103593 Type 1 diabetes mellitus with proliferative diabetic retinopathy without macular edema, bilateral: Secondary | ICD-10-CM | POA: Diagnosis not present

## 2017-01-29 DIAGNOSIS — E1022 Type 1 diabetes mellitus with diabetic chronic kidney disease: Secondary | ICD-10-CM | POA: Diagnosis not present

## 2017-01-29 DIAGNOSIS — Z86718 Personal history of other venous thrombosis and embolism: Secondary | ICD-10-CM | POA: Diagnosis not present

## 2017-01-29 DIAGNOSIS — Z9649 Presence of other endocrine implants: Secondary | ICD-10-CM | POA: Diagnosis not present

## 2017-01-29 DIAGNOSIS — E559 Vitamin D deficiency, unspecified: Secondary | ICD-10-CM | POA: Diagnosis not present

## 2017-02-15 DIAGNOSIS — S82832A Other fracture of upper and lower end of left fibula, initial encounter for closed fracture: Secondary | ICD-10-CM | POA: Diagnosis not present

## 2017-02-15 DIAGNOSIS — W19XXXA Unspecified fall, initial encounter: Secondary | ICD-10-CM | POA: Diagnosis not present

## 2017-02-15 DIAGNOSIS — Z7901 Long term (current) use of anticoagulants: Secondary | ICD-10-CM | POA: Diagnosis not present

## 2017-02-15 DIAGNOSIS — G8929 Other chronic pain: Secondary | ICD-10-CM | POA: Diagnosis not present

## 2017-02-15 DIAGNOSIS — S82142A Displaced bicondylar fracture of left tibia, initial encounter for closed fracture: Secondary | ICD-10-CM | POA: Diagnosis not present

## 2017-02-15 DIAGNOSIS — S8292XA Unspecified fracture of left lower leg, initial encounter for closed fracture: Secondary | ICD-10-CM | POA: Diagnosis not present

## 2017-02-15 DIAGNOSIS — S42252A Displaced fracture of greater tuberosity of left humerus, initial encounter for closed fracture: Secondary | ICD-10-CM | POA: Diagnosis not present

## 2017-02-15 DIAGNOSIS — S82222A Displaced transverse fracture of shaft of left tibia, initial encounter for closed fracture: Secondary | ICD-10-CM | POA: Diagnosis not present

## 2017-02-15 DIAGNOSIS — M79606 Pain in leg, unspecified: Secondary | ICD-10-CM | POA: Diagnosis not present

## 2017-02-17 DIAGNOSIS — S82202A Unspecified fracture of shaft of left tibia, initial encounter for closed fracture: Secondary | ICD-10-CM | POA: Diagnosis not present

## 2017-02-19 ENCOUNTER — Emergency Department (HOSPITAL_COMMUNITY): Payer: Medicare Other

## 2017-02-19 ENCOUNTER — Observation Stay (HOSPITAL_COMMUNITY)
Admission: EM | Admit: 2017-02-19 | Discharge: 2017-02-20 | Disposition: A | Discharge status: Home / Self Care | Payer: Medicare Other | Attending: Internal Medicine | Admitting: Internal Medicine

## 2017-02-19 ENCOUNTER — Encounter (HOSPITAL_COMMUNITY): Payer: Self-pay | Admitting: *Deleted

## 2017-02-19 DIAGNOSIS — I10 Essential (primary) hypertension: Secondary | ICD-10-CM | POA: Diagnosis not present

## 2017-02-19 DIAGNOSIS — Z7982 Long term (current) use of aspirin: Secondary | ICD-10-CM | POA: Diagnosis not present

## 2017-02-19 DIAGNOSIS — R739 Hyperglycemia, unspecified: Secondary | ICD-10-CM | POA: Diagnosis not present

## 2017-02-19 DIAGNOSIS — E1100 Type 2 diabetes mellitus with hyperosmolarity without nonketotic hyperglycemic-hyperosmolar coma (NKHHC): Secondary | ICD-10-CM | POA: Diagnosis not present

## 2017-02-19 DIAGNOSIS — S7290XA Unspecified fracture of unspecified femur, initial encounter for closed fracture: Secondary | ICD-10-CM

## 2017-02-19 DIAGNOSIS — Z87891 Personal history of nicotine dependence: Secondary | ICD-10-CM | POA: Diagnosis not present

## 2017-02-19 DIAGNOSIS — D649 Anemia, unspecified: Secondary | ICD-10-CM

## 2017-02-19 DIAGNOSIS — Z7901 Long term (current) use of anticoagulants: Secondary | ICD-10-CM

## 2017-02-19 DIAGNOSIS — Z79899 Other long term (current) drug therapy: Secondary | ICD-10-CM | POA: Insufficient documentation

## 2017-02-19 DIAGNOSIS — Z86711 Personal history of pulmonary embolism: Secondary | ICD-10-CM | POA: Diagnosis present

## 2017-02-19 DIAGNOSIS — Z794 Long term (current) use of insulin: Secondary | ICD-10-CM | POA: Diagnosis not present

## 2017-02-19 DIAGNOSIS — IMO0002 Reserved for concepts with insufficient information to code with codable children: Secondary | ICD-10-CM | POA: Diagnosis present

## 2017-02-19 DIAGNOSIS — R4182 Altered mental status, unspecified: Secondary | ICD-10-CM | POA: Diagnosis not present

## 2017-02-19 DIAGNOSIS — E1065 Type 1 diabetes mellitus with hyperglycemia: Secondary | ICD-10-CM | POA: Insufficient documentation

## 2017-02-19 DIAGNOSIS — N179 Acute kidney failure, unspecified: Secondary | ICD-10-CM | POA: Diagnosis present

## 2017-02-19 DIAGNOSIS — E1165 Type 2 diabetes mellitus with hyperglycemia: Secondary | ICD-10-CM | POA: Diagnosis not present

## 2017-02-19 LAB — COMPREHENSIVE METABOLIC PANEL
ALBUMIN: 3.3 g/dL — AB (ref 3.5–5.0)
ALK PHOS: 93 U/L (ref 38–126)
ALT: 16 U/L — ABNORMAL LOW (ref 17–63)
ANION GAP: 13 (ref 5–15)
AST: 22 U/L (ref 15–41)
BUN: 32 mg/dL — ABNORMAL HIGH (ref 6–20)
CO2: 23 mmol/L (ref 22–32)
Calcium: 8.8 mg/dL — ABNORMAL LOW (ref 8.9–10.3)
Chloride: 98 mmol/L — ABNORMAL LOW (ref 101–111)
Creatinine, Ser: 1.36 mg/dL — ABNORMAL HIGH (ref 0.61–1.24)
GFR calc Af Amer: 52 mL/min — ABNORMAL LOW (ref >60)
GFR calc non Af Amer: 45 mL/min — ABNORMAL LOW (ref >60)
GLUCOSE: 406 mg/dL — AB (ref 65–99)
POTASSIUM: 4.6 mmol/L (ref 3.5–5.1)
SODIUM: 134 mmol/L — AB (ref 135–145)
Total Bilirubin: 1.2 mg/dL (ref 0.3–1.2)
Total Protein: 6.3 g/dL — ABNORMAL LOW (ref 6.5–8.1)

## 2017-02-19 LAB — CBC
HEMATOCRIT: 30.5 % — AB (ref 39.0–52.0)
HEMOGLOBIN: 10.4 g/dL — AB (ref 13.0–17.0)
MCH: 31.4 pg (ref 26.0–34.0)
MCHC: 34.1 g/dL (ref 30.0–36.0)
MCV: 92.1 fL (ref 78.0–100.0)
Platelets: 273 10*3/uL (ref 150–400)
RBC: 3.31 MIL/uL — ABNORMAL LOW (ref 4.22–5.81)
RDW: 14.6 % (ref 11.5–15.5)
WBC: 7.7 10*3/uL (ref 4.0–10.5)

## 2017-02-19 LAB — CBG MONITORING, ED
Glucose-Capillary: 385 mg/dL — ABNORMAL HIGH (ref 65–99)
Glucose-Capillary: 306 mg/dL — ABNORMAL HIGH (ref 65–99)

## 2017-02-19 LAB — GLUCOSE, CAPILLARY: Glucose-Capillary: 357 mg/dL — ABNORMAL HIGH (ref 65–99)

## 2017-02-19 MED ORDER — SODIUM CHLORIDE 0.9 % IV BOLUS (SEPSIS)
500.0000 mL | Freq: Once | INTRAVENOUS | Status: AC
  Administered 2017-02-19: 500 mL via INTRAVENOUS

## 2017-02-19 MED ORDER — LATANOPROST 0.005 % OP SOLN
1.0000 [drp] | Freq: Every day | OPHTHALMIC | Status: DC
  Administered 2017-02-20: 1 [drp] via OPHTHALMIC
  Filled 2017-02-19: qty 2.5

## 2017-02-19 MED ORDER — ACETAMINOPHEN 325 MG PO TABS: 650.0000 mg | ORAL_TABLET | Freq: Four times a day (QID) | ORAL | Status: DC | PRN

## 2017-02-19 MED ORDER — INSULIN ASPART 100 UNIT/ML ~~LOC~~ SOLN
4.0000 [IU] | Freq: Once | SUBCUTANEOUS | Status: AC
  Administered 2017-02-19: 4 [IU] via INTRAVENOUS
  Filled 2017-02-19: qty 1

## 2017-02-19 MED ORDER — ONDANSETRON HCL 4 MG/2ML IJ SOLN: 4.0000 mg | Freq: Four times a day (QID) | INTRAMUSCULAR | Status: DC | PRN

## 2017-02-19 MED ORDER — CITALOPRAM HYDROBROMIDE 20 MG PO TABS
20.0000 mg | ORAL_TABLET | Freq: Every morning | ORAL | Status: DC
  Administered 2017-02-20: 20 mg via ORAL
  Filled 2017-02-19: qty 1

## 2017-02-19 MED ORDER — INSULIN GLARGINE 100 UNIT/ML ~~LOC~~ SOLN
7.0000 [IU] | Freq: Every day | SUBCUTANEOUS | Status: DC
  Administered 2017-02-20: 7 [IU] via SUBCUTANEOUS
  Filled 2017-02-19 (×2): qty 0.07

## 2017-02-19 MED ORDER — PANTOPRAZOLE SODIUM 40 MG PO TBEC
40.0000 mg | DELAYED_RELEASE_TABLET | Freq: Every day | ORAL | Status: DC
  Administered 2017-02-20: 40 mg via ORAL
  Filled 2017-02-19: qty 1

## 2017-02-19 MED ORDER — ONDANSETRON HCL 4 MG PO TABS: 4.0000 mg | ORAL_TABLET | Freq: Four times a day (QID) | ORAL | Status: DC | PRN

## 2017-02-19 MED ORDER — SODIUM CHLORIDE 0.9 % IV SOLN
INTRAVENOUS | Status: DC
  Administered 2017-02-19 – 2017-02-20 (×3): via INTRAVENOUS

## 2017-02-19 MED ORDER — ACETAMINOPHEN 650 MG RE SUPP: 650.0000 mg | Freq: Four times a day (QID) | RECTAL | Status: DC | PRN

## 2017-02-19 MED ORDER — INSULIN DEGLUDEC 100 UNIT/ML ~~LOC~~ SOPN: 7.0000 [IU] | PEN_INJECTOR | Freq: Every day | SUBCUTANEOUS | Status: DC

## 2017-02-19 MED ORDER — RIVAROXABAN 20 MG PO TABS
20.0000 mg | ORAL_TABLET | Freq: Every day | ORAL | Status: DC
  Administered 2017-02-20 (×2): 20 mg via ORAL
  Filled 2017-02-19 (×2): qty 1

## 2017-02-19 MED ORDER — VITAMIN B-12 1000 MCG PO TABS
1000.0000 ug | ORAL_TABLET | Freq: Every day | ORAL | Status: DC
Start: 2017-02-20 — End: 2017-02-20
  Administered 2017-02-20: 1000 ug via ORAL
  Filled 2017-02-19: qty 1

## 2017-02-19 MED ORDER — ASPIRIN 81 MG PO TABS: 81.0000 mg | ORAL_TABLET | Freq: Every morning | ORAL | Status: DC

## 2017-02-19 MED ORDER — ASPIRIN EC 81 MG PO TBEC
81.0000 mg | DELAYED_RELEASE_TABLET | Freq: Every day | ORAL | Status: DC
  Administered 2017-02-20: 81 mg via ORAL
  Filled 2017-02-19: qty 1

## 2017-02-19 MED ORDER — SODIUM CHLORIDE 0.9 % IV BOLUS (SEPSIS)
1000.0000 mL | Freq: Once | INTRAVENOUS | Status: AC
  Administered 2017-02-19: 1000 mL via INTRAVENOUS

## 2017-02-19 MED ORDER — EZETIMIBE 10 MG PO TABS
5.0000 mg | ORAL_TABLET | Freq: Every day | ORAL | Status: DC
  Administered 2017-02-20: 5 mg via ORAL
  Filled 2017-02-19: qty 1

## 2017-02-19 MED ORDER — INSULIN ASPART 100 UNIT/ML ~~LOC~~ SOLN
0.0000 [IU] | SUBCUTANEOUS | Status: DC
  Administered 2017-02-20: 2 [IU] via SUBCUTANEOUS
  Administered 2017-02-20 (×2): 7 [IU] via SUBCUTANEOUS

## 2017-02-19 MED ORDER — LATANOPROST 0.005 % OP SOLN
OPHTHALMIC | Status: AC
  Filled 2017-02-19: qty 2.5

## 2017-02-19 NOTE — ED Triage Notes (Signed)
Pt comes in with family and care taker for altered mental status. Pt had a fall on Saturday in which he broke his femur. He was seen at [facility] hospital for this. Yesterday he began getting confused. Pt is alert at present. Pt is oriented to person and place. Disoriented to time. Caretaker denies any urinary problems.   At completion of triage patient adds he hit his head on Saturday but family wasn't aware of that. They don't think he got any CT scans then.

## 2017-02-19 NOTE — ED Notes (Signed)
Nurse unavailable for report.  

## 2017-02-19 NOTE — H&P (Signed)
[facility] History and Physical  Eduardo Lee [id number] DOB: 10-Dec-1925 DOA: 02/19/2017  Referring physician: Dr Lacinda Axon PCP: Eduardo Hillier, MD   Chief Complaint: Confusion  HPI: Eduardo Lee is a 83 y.o. male with history of CVA, PE, HTN, DVT and IDDM , hx gastroparesis and neuropathy from DM.  Pt was out of town this past weekend visiting a friend in [place], fell and broke the L distal femur reportedly, that was 4 days ago.  2 days ago on Monday he saw ortho MD and got the left leg casted (family says they would have done surgery but pt was "too old").  He took pain meds on Sat and Sun but after casting on Monday didn't take any more pain medications. Came back today and pt's two sons noticed confusion and brought him to ED.  BS here is 400 and creat 1.3.  CT head no acute.  WBC wnl, no fevers, UA pending.  Urine is "dark".  Asked to see for admission.   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  Had 3 sons.  Quit smoking many yrs ago, no etoh history.    Home meds > asa, degludec 7 ug qhs, humalog Kwikpen 3-5 ac tid, vicodin prn, aleve, miralax, Xarelto, vitamins, Zetia, nexium, Celexa  ROS  denies CP  no joint pain   no HA  no blurry vision  no rash  no diarrhea  no nausea/ vomiting  no dysuria  no difficulty voiding  no change in urine color    Past Medical History  Past Medical History:  Diagnosis Date  . Diabetes mellitus   . DVT (deep venous thrombosis) ([place])   . Gastroparesis   . Hypertension   . Neuropathy ([place])   . Osteoporosis   . Pulmonary embolism ([place]) 12/2010  . Stroke ([place])   . Weight loss    Past Surgical History  Past Surgical History:  Procedure Laterality Date  . ANKLE SURGERY     Patient states that he had pins place in the left foot  . BACK SURGERY  2008  . CARPAL TUNNEL RELEASE  08/2011   Left hand  . COLONOSCOPY  01/2011  . ORIF HIP FRACTURE  10/31/2011   Procedure: OPEN  REDUCTION INTERNAL FIXATION HIP;  Surgeon: Arther Abbott, MD;  Location: [facility];  Service: Orthopedics;  Laterality: Right;  with Gamma Nail  . TRANSURETHRAL RESECTION OF PROSTATE  1984  . UPPER GASTROINTESTINAL ENDOSCOPY  01/2011   Family History  Family History  Problem Relation Age of Onset  . Diabetes Mother   . Diabetes Father   . Diabetes Sister   . Diabetes Brother   . Diabetes Sister   . Diabetes Son   . Healthy Son   . Healthy Son    Social History  reports that he quit smoking about 35 years ago. His smoking use included Cigarettes. He has never used smokeless tobacco. He reports that he drinks alcohol. He reports that he does not use drugs. Allergies No Known Allergies Home medications Prior to Admission medications   Medication Sig Start Date End Date Taking? Authorizing Provider  aspirin 81 MG tablet Take 81 mg by mouth every morning.    Yes Historical Provider, MD  BD INSULIN SYRINGE ULTRAFINE 31G X 5/16" 0.3 ML MISC USE UP TO TWICE DAILY 05/17/15  Yes Eduardo Kirschner, MD  citalopram (CELEXA) 20 MG tablet Take 1 tablet (20 mg total)  by mouth every morning. 11/11/16  Yes Eduardo Kirschner, MD  esomeprazole (NEXIUM) 40 MG capsule Take 40 mg by mouth daily as needed. Reported on 05/10/2016   Yes Historical Provider, MD  ezetimibe (ZETIA) 10 MG tablet Take 1 tablet (10 mg total) by mouth at bedtime. Patient taking differently: Take 5 mg by mouth at bedtime.  11/11/16  Yes Eduardo Kirschner, MD  guaiFENesin (MUCINEX) 600 MG 12 hr tablet Take 600 mg by mouth daily as needed for cough or to loosen phlegm.   Yes Historical Provider, MD  HUMALOG KWIKPEN 100 UNIT/ML SOPN Inject 3-5 Units into the skin 3 (three) times daily before meals. Inject 5-6 units 3 times daily 03/22/13  Yes Historical Provider, MD  HYDROcodone-acetaminophen (NORCO/VICODIN) 5-325 MG tablet Take 1 tablet by mouth every 4 (four) hours as needed for pain. 02/19/17  Yes Historical Provider, MD  Insulin Degludec  (TRESIBA FLEXTOUCH) 100 UNIT/ML SOPN Inject 7 Units into the skin at bedtime.    Yes Historical Provider, MD  latanoprost (XALATAN) 0.005 % ophthalmic solution Place 1 drop into both eyes at bedtime.    Yes Historical Provider, MD  naproxen sodium (ALEVE) 220 MG tablet Take 440 mg by mouth daily as needed (for pain).    Yes Historical Provider, MD  polyethylene glycol powder (GLYCOLAX/MIRALAX) powder Take 34 g by mouth every morning.   Yes Historical Provider, MD  rivaroxaban (XARELTO) 20 MG TABS tablet Take 1 tablet (20 mg total) by mouth daily with supper. 11/11/16  Yes Eduardo Kirschner, MD  vitamin B-12 (CYANOCOBALAMIN) 1000 MCG tablet Take 1,000 mcg by mouth daily.     Yes Historical Provider, MD  Vitamin D, Ergocalciferol, (DRISDOL) 50000 UNITS CAPS Take 50,000 Units by mouth every 7 (seven) days.    Yes Historical Provider, MD  UNIFINE PENTIPS 31G X 8 MM MISC USE UP TO 3 TIMES A DAY Patient taking differently: USE UP TO 5 TIMES A DAY 10/09/15   Eduardo Kirschner, MD   Liver Function Tests  Recent Labs Lab 02/19/17 1541  AST 22  ALT 16*  ALKPHOS 93  BILITOT 1.2  PROT 6.3*  ALBUMIN 3.3*   No results for input(s): LIPASE, AMYLASE in the last 168 hours. CBC  Recent Labs Lab 02/19/17 1541  WBC 7.7  HGB 10.4*  HCT 30.5*  MCV 92.1  PLT 259   Basic Metabolic Panel  Recent Labs Lab 02/19/17 1541  NA 134*  K 4.6  CL 98*  CO2 23  GLUCOSE 406*  BUN 32*  CREATININE 1.36*  CALCIUM 8.8*     Vitals:   02/19/17 1700 02/19/17 1730 02/19/17 1745 02/19/17 1830  BP: (!) 151/76 (!) 153/73  130/68  Pulse: (!) 105  93 (!) 104  Resp: 17 19 (!) 21 20  Temp:      TempSrc:      SpO2: 97%  98% 100%  Weight:      Height:       Exam: Gen pleasant elderly WM, looks tired, eyes bloodshot, responsive, partially oriented No rash, cyanosis or gangrene Sclera anicteric, throat clear and dry No jvd or bruits Chest clear bilat RRR no MRG tachy Abd soft ntnd no mass or ascites +bs GU  normal male MS L leg in cast from ankle to mid-thigh; L foot warm and pink R leg no joint effusion/ deformity Ext no LE edema / no wounds or ulcers Neuro is alert and nonfocal, tired and mild gen'd weakness   Na 134  BUN 32 Cr 1.36  Glu 406  Ca 8.8  Alb 3.3  eGFR 45 WBC 7k  Hb 10.4  plt 273  CT head - no acute   EKG (independ reviewed) > sinus tach, no ischemia   Assessment: 1. Altered mental status - in this elderly man w/ fall and femur fracture 4 days ago now here with uncont IDDM, dehydration and AKI.  Suspect these are cause of confusion.  Not taking Vicodin the last two days.  Needs admit, cont IVF's, and BS control.  Get UA, pending, also, to r/o UTI.   2. Uncont IDDM - use SSI and give one dose of his long-acting insulin 3. Femur fracture - may need ortho eval while here 4. Hist DVT/ PE - on Xarelto, continue 5. Hist R hip fracture/ ORIF 2014 6. AKI - due to vol depletion from uncont DM and pain from fracture  Plan - as above     [place] [facility] Pager [phone]   If 7PM-7AM, please contact night-coverage [email or web address] Password [email or web address] 02/19/2017, 8:21 PM

## 2017-02-19 NOTE — ED Notes (Signed)
Patient unable to give urine sample at this time. Informed Dr. Lacinda Axon, no new orders given.

## 2017-02-19 NOTE — ED Provider Notes (Addendum)
[place] DEPT Provider Note   CSN: [id number] Arrival date & time: 02/19/17  1510     History   Chief Complaint Chief Complaint  Patient presents with  . Altered Mental Status    HPI Eduardo Lee is a 83 y.o. male.  Level V caveat for altered mental status. Son reports normal mentation 2 days ago. He sustained an accidental trip and fall on Saturday resulting in a fracture of a bone in his left lower extremity.  Family is uncertain which specific bone was broken. Additionally, he struck the right parietal scalp in the fall.  He has a cast from the distal femur to the foot. No fever, sweats, chills, dysuria, chest pain, dyspnea, new neuro deficits. He also has visual hallucinations which are not normal.      Past Medical History:  Diagnosis Date  . Diabetes mellitus   . DVT (deep venous thrombosis) ([place])   . Gastroparesis   . Hypertension   . Neuropathy ([place])   . Osteoporosis   . Pulmonary embolism ([place]) 12/2010  . Stroke ([place])   . Weight loss     Patient Active Problem List   Diagnosis Date Noted  . Diabetic peripheral neuropathy ([place]) 12/11/2014  . Chronic upper back pain 12/11/2014  . Osteoporosis, unspecified 06/23/2014  . Chronic anticoagulation 05/26/2013  . Acute DVT (deep venous thrombosis) ([place]) 05/09/2013  . Left leg DVT ([place]) 05/07/2013  . Hypertension   . Intertrochanteric fracture of right femur ([place]) 12/09/2011  . Hip fracture, right ([place]) 10/29/2011  . ARF (acute renal failure) ([place]) 10/29/2011  . Diabetes type 2, uncontrolled ([place]) 10/29/2011  . Leukocytosis 10/29/2011  . Normocytic anemia 10/29/2011  . Diabetic gastroparesis ([place]) 10/29/2011  . Weight loss, abnormal 10/29/2011    Past Surgical History:  Procedure Laterality Date  . ANKLE SURGERY     Patient states that he had pins place in the left foot  . BACK SURGERY  2008  . CARPAL TUNNEL RELEASE  08/2011   Left hand  . COLONOSCOPY  01/2011  . ORIF HIP FRACTURE  10/31/2011   Procedure: OPEN REDUCTION INTERNAL FIXATION HIP;  Surgeon: Arther Abbott, MD;  Location: [facility];  Service: Orthopedics;  Laterality: Right;  with Gamma Nail  . TRANSURETHRAL RESECTION OF PROSTATE  1984  . UPPER GASTROINTESTINAL ENDOSCOPY  01/2011       Home Medications    Prior to Admission medications   Medication Sig Start Date End Date Taking? Authorizing Provider  aspirin 81 MG tablet Take 81 mg by mouth every morning.    Yes Historical Provider, MD  BD INSULIN SYRINGE ULTRAFINE 31G X 5/16" 0.3 ML MISC USE UP TO TWICE DAILY 05/17/15  Yes Mikey Kirschner, MD  citalopram (CELEXA) 20 MG tablet Take 1 tablet (20 mg total) by mouth every morning. 11/11/16  Yes Mikey Kirschner, MD  esomeprazole (NEXIUM) 40 MG capsule Take 40 mg by mouth daily as needed. Reported on 05/10/2016   Yes Historical Provider, MD  ezetimibe (ZETIA) 10 MG tablet Take 1 tablet (10 mg total) by mouth at bedtime. Patient taking differently: Take 5 mg by mouth at bedtime.  11/11/16  Yes Mikey Kirschner, MD  guaiFENesin (MUCINEX) 600 MG 12 hr tablet Take 600 mg by mouth daily as needed for cough or to loosen phlegm.   Yes Historical Provider, MD  HUMALOG KWIKPEN 100 UNIT/ML SOPN Inject 3-5 Units into the skin 3 (three) times daily before meals. Inject 5-6 units 3 times daily  03/22/13  Yes Historical Provider, MD  HYDROcodone-acetaminophen (NORCO/VICODIN) 5-325 MG tablet Take 1 tablet by mouth every 4 (four) hours as needed for pain. 02/19/17  Yes Historical Provider, MD  Insulin Degludec (TRESIBA FLEXTOUCH) 100 UNIT/ML SOPN Inject 7 Units into the skin at bedtime.    Yes Historical Provider, MD  latanoprost (XALATAN) 0.005 % ophthalmic solution Place 1 drop into both eyes at bedtime.    Yes Historical Provider, MD  naproxen sodium (ALEVE) 220 MG tablet Take 440 mg by mouth daily as needed (for pain).    Yes Historical Provider, MD  polyethylene glycol powder (GLYCOLAX/MIRALAX) powder Take 34 g by mouth every morning.   Yes  Historical Provider, MD  rivaroxaban (XARELTO) 20 MG TABS tablet Take 1 tablet (20 mg total) by mouth daily with supper. 11/11/16  Yes Mikey Kirschner, MD  vitamin B-12 (CYANOCOBALAMIN) 1000 MCG tablet Take 1,000 mcg by mouth daily.     Yes Historical Provider, MD  Vitamin D, Ergocalciferol, (DRISDOL) 50000 UNITS CAPS Take 50,000 Units by mouth every 7 (seven) days.    Yes Historical Provider, MD  UNIFINE PENTIPS 31G X 8 MM MISC USE UP TO 3 TIMES A DAY Patient taking differently: USE UP TO 5 TIMES A DAY 10/09/15   Mikey Kirschner, MD    Family History Family History  Problem Relation Age of Onset  . Diabetes Mother   . Diabetes Father   . Diabetes Sister   . Diabetes Brother   . Diabetes Sister   . Diabetes Son   . Healthy Son   . Healthy Son     Social History Social History  Substance Use Topics  . Smoking status: Former Smoker    Types: Cigarettes    Quit date: 10/27/1981  . Smokeless tobacco: Never Used  . Alcohol use 0.0 oz/week     Comment: Drinks Scotch 3 times per week prior to [employer]- hasn't drank x 1 month     Allergies   Patient has no known allergies.   Review of Systems Review of Systems  Reason unable to perform ROS: AMS.     Physical Exam Updated Vital Signs BP (!) 153/73   Pulse 93   Temp 98.1 F (36.7 C) (Oral)   Resp (!) 21   Ht 5\' 8"  (1.727 m)   Wt 132 lb (59.9 kg)   SpO2 98%   BMI 20.07 kg/m   Physical Exam  Constitutional:  pleasant  HENT:  Head: Normocephalic and atraumatic.  Eyes: Conjunctivae are normal.  Neck: Neck supple.  Cardiovascular: Normal rate and regular rhythm.   Pulmonary/Chest: Effort normal and breath sounds normal.  Abdominal: Soft. Bowel sounds are normal.  Musculoskeletal: Normal range of motion.  Neurological: He is alert.  Oriented times two;  Missed day  Skin: Skin is warm and dry.  Psychiatric:  Flat affect  Nursing note and vitals reviewed.    ED Treatments / Results  Labs (all labs ordered  are listed, but only abnormal results are displayed) Labs Reviewed  COMPREHENSIVE METABOLIC PANEL - Abnormal; Notable for the following:       Result Value   Sodium 134 (*)    Chloride 98 (*)    Glucose, Bld 406 (*)    BUN 32 (*)    Creatinine, Ser 1.36 (*)    Calcium 8.8 (*)    Total Protein 6.3 (*)    Albumin 3.3 (*)    ALT 16 (*)    GFR calc non Af  Amer 45 (*)    GFR calc Af Amer 52 (*)    All other components within normal limits  CBC - Abnormal; Notable for the following:    RBC 3.31 (*)    Hemoglobin 10.4 (*)    HCT 30.5 (*)    All other components within normal limits  CBG MONITORING, ED - Abnormal; Notable for the following:    Glucose-Capillary 385 (*)    All other components within normal limits  URINALYSIS, ROUTINE W REFLEX MICROSCOPIC    EKG  EKG Interpretation  Date/Time:  Wednesday February 19 2017 15:45:30 EDT Ventricular Rate:  92 PR Interval:    QRS Duration: 75 QT Interval:  333 QTC Calculation: 412 R Axis:   64 Text Interpretation:  Sinus rhythm Atrial premature complex Prolonged PR interval Borderline T abnormalities, inferior leads Baseline wander in lead(s) I II aVR aVL V1 V4 Confirmed by Ilee Randleman  MD, Nela Bascom ([id number]) on 02/19/2017 5:08:08 PM       Radiology Ct Head Wo Contrast  Result Date: 02/19/2017 CLINICAL DATA:  Altered mental status with history of fall EXAM: CT HEAD WITHOUT CONTRAST TECHNIQUE: Contiguous axial images were obtained from the base of the skull through the vertex without intravenous contrast. COMPARISON:  10/29/2011 FINDINGS: Brain: No acute territorial infarction, hemorrhage or mass is visualized. Moderate diffuse atrophy. Mild to moderate periventricular and subcortical white matter small vessel ischemic changes. Old lacunar infarcts within the thalamus and right basal ganglia. Enlarged ventricles are stable in size and felt secondary to atrophy. Vascular: No hyperdense vessels. Carotid artery calcifications and vertebral artery  calcifications. Skull: No fracture.  No suspicious bone lesion. Sinuses/Orbits: Mucosal thickening in the ethmoid and maxillary sinuses. Bony remottling of the left maxillary sinus wall consistent with chronic sinusitis. No acute orbital abnormality. Other: None IMPRESSION: 1. No CT evidence for acute intracranial abnormality 2. Atrophy with mild to moderate white matter small vessel ischemic changes. Electronically Signed   By: Donavan Foil M.D.   On: 02/19/2017 16:57    Procedures Procedures (including critical care time)  Medications Ordered in ED Medications  sodium chloride 0.9 % bolus 500 mL (0 mLs Intravenous Stopped 02/19/17 1723)  insulin aspart (novoLOG) injection 4 Units (4 Units Intravenous Given 02/19/17 1756)     Initial Impression / Assessment and Plan / ED Course  I have reviewed the triage vital signs and the nursing notes.  Pertinent labs & imaging results that were available during my care of the patient were reviewed by me and considered in my medical decision making (see chart for details).     Family reports altered mental status.  CT head negative. Urinalysis pending. His glucose is elevated. IV fluids, 4 units regular insulin IV, admit to general medicine.  Final Clinical Impressions(s) / ED Diagnoses   Final diagnoses:  Altered mental status, unspecified altered mental status type  Hyperglycemia    New Prescriptions New Prescriptions   No medications on file     Nat Christen, MD 02/19/17 [place], MD 02/19/17 [phone]

## 2017-02-20 ENCOUNTER — Inpatient Hospital Stay
Admission: RE | Admit: 2017-02-20 | Discharge: 2017-02-26 | Disposition: A | Discharge status: Nursing Home (Includes ICF) | Payer: Medicare Other | Source: Ambulatory Visit | Attending: Internal Medicine | Admitting: Internal Medicine

## 2017-02-20 ENCOUNTER — Telehealth: Payer: Self-pay | Admitting: Family Medicine

## 2017-02-20 DIAGNOSIS — R4182 Altered mental status, unspecified: Secondary | ICD-10-CM | POA: Diagnosis not present

## 2017-02-20 DIAGNOSIS — E1111 Type 2 diabetes mellitus with ketoacidosis with coma: Secondary | ICD-10-CM

## 2017-02-20 DIAGNOSIS — Z794 Long term (current) use of insulin: Secondary | ICD-10-CM | POA: Diagnosis not present

## 2017-02-20 DIAGNOSIS — N179 Acute kidney failure, unspecified: Secondary | ICD-10-CM | POA: Diagnosis not present

## 2017-02-20 LAB — URINALYSIS, ROUTINE W REFLEX MICROSCOPIC
Bacteria, UA: NONE SEEN
Bilirubin Urine: NEGATIVE
Glucose, UA: >=500 mg/dL — AB
Hgb urine dipstick: NEGATIVE
Ketones, ur: 20 mg/dL — AB
Leukocytes, UA: NEGATIVE
Nitrite: NEGATIVE
Protein, ur: NEGATIVE mg/dL
SPECIFIC GRAVITY, URINE: 1.021 (ref 1.005–1.030)
pH: 5 (ref 5.0–8.0)

## 2017-02-20 LAB — BASIC METABOLIC PANEL
ANION GAP: 12 (ref 5–15)
BUN: 25 mg/dL — ABNORMAL HIGH (ref 6–20)
CHLORIDE: 105 mmol/L (ref 101–111)
CO2: 23 mmol/L (ref 22–32)
Calcium: 8.6 mg/dL — ABNORMAL LOW (ref 8.9–10.3)
Creatinine, Ser: 1.12 mg/dL (ref 0.61–1.24)
GFR calc non Af Amer: 56 mL/min — ABNORMAL LOW (ref >60)
Glucose, Bld: 194 mg/dL — ABNORMAL HIGH (ref 65–99)
Potassium: 4.2 mmol/L (ref 3.5–5.1)
Sodium: 140 mmol/L (ref 135–145)

## 2017-02-20 LAB — GLUCOSE, CAPILLARY
GLUCOSE-CAPILLARY: 197 mg/dL — AB (ref 65–99)
GLUCOSE-CAPILLARY: 145 mg/dL — AB (ref 65–99)
GLUCOSE-CAPILLARY: 325 mg/dL — AB (ref 65–99)
Glucose-Capillary: 348 mg/dL — ABNORMAL HIGH (ref 65–99)

## 2017-02-20 LAB — CBC
HEMATOCRIT: 30.1 % — AB (ref 39.0–52.0)
HEMOGLOBIN: 10.1 g/dL — AB (ref 13.0–17.0)
MCH: 30.9 pg (ref 26.0–34.0)
MCHC: 33.6 g/dL (ref 30.0–36.0)
MCV: 92 fL (ref 78.0–100.0)
PLATELETS: 308 10*3/uL (ref 150–400)
RBC: 3.27 MIL/uL — AB (ref 4.22–5.81)
RDW: 14.6 % (ref 11.5–15.5)
WBC: 6.5 10*3/uL (ref 4.0–10.5)

## 2017-02-20 MED ORDER — ADULT MULTIVITAMIN W/MINERALS CH
1.0000 | ORAL_TABLET | Freq: Every day | ORAL | Status: DC
  Administered 2017-02-20: 1 via ORAL
  Filled 2017-02-20: qty 1

## 2017-02-20 MED ORDER — ENSURE ENLIVE PO LIQD
237.0000 mL | Freq: Two times a day (BID) | ORAL | Status: DC
  Administered 2017-02-20 (×2): 237 mL via ORAL

## 2017-02-20 MED ORDER — PRO-STAT SUGAR FREE PO LIQD
30.0000 mL | Freq: Two times a day (BID) | ORAL | Status: DC
  Administered 2017-02-20: 30 mL via ORAL
  Filled 2017-02-20: qty 30

## 2017-02-20 MED ORDER — INSULIN GLARGINE 100 UNIT/ML ~~LOC~~ SOLN: 7.0000 [IU] | Freq: Every day | SUBCUTANEOUS | 11 refills | Status: DC

## 2017-02-20 MED ORDER — ADULT MULTIVITAMIN W/MINERALS CH: 1.0000 | ORAL_TABLET | Freq: Every day | ORAL | Status: DC

## 2017-02-20 NOTE — Care Management Note (Signed)
Case Management Note  Patient Details  Name: Eduardo Lee MRN: [id number] Date of Birth: 02/24/1926  Subjective/Objective:                  Admitted with AMS. PTA living with spouse and caregiver in [facility], pt fx leg and caregiver/spouse in [facility] unable to care for him. Sons have brought him back to [place] and he has since developed AMS. Family would like placement. Sons at bedside.  Action/Plan: Discharging to SNF today. CSW working with family to make arrangements. Paying OOP for [facility].  Expected Discharge Date:  02/20/17               Expected Discharge Plan:  [place]  In-House Referral:  Clinical Social Work  Discharge planning Services  CM Consult  Post Acute Care Choice:  NA Choice offered to:  NA  Status of Service:  Completed, signed off  Sherald Barge, RN 02/20/2017, 4:39 PM

## 2017-02-20 NOTE — NC FL2 (Signed)
[place] LEVEL OF CARE SCREENING TOOL     IDENTIFICATION  Patient Name: Eduardo Lee Birthdate: October 04, 1926 Sex: male Admission Date (Current Location): 02/19/2017  [facility] and [place] Number:  [employer] and Address:  [place] [address], [place]      Provider Number: [id number]  Attending Physician Name and Address:  Koleen Nimrod Acost*  Relative Name and Phone Number:       Current Level of Care: Hospital Recommended Level of Care: [place] Prior Approval Number:    Date Approved/Denied:   PASRR Number: [id number]  Discharge Plan: SNF    Current Diagnoses: Patient Active Problem List   Diagnosis Date Noted  . Altered mental status 02/19/2017  . History of pulmonary embolism./ DVT 02/19/2017  . Fracture, femur ([place]) 02/19/2017  . Hyperglycemia   . Diabetic peripheral neuropathy ([place]) 12/11/2014  . Chronic upper back pain 12/11/2014  . Osteoporosis, unspecified 06/23/2014  . Chronic anticoagulation 05/26/2013  . Acute DVT (deep venous thrombosis) ([place]) 05/09/2013  . Left leg DVT ([place]) 05/07/2013  . Hypertension   . Intertrochanteric fracture of right femur ([place]) 12/09/2011  . ARF (acute renal failure) ([place]) 10/29/2011  . Diabetes type 2, uncontrolled ([place]) 10/29/2011  . Leukocytosis 10/29/2011  . Normocytic anemia 10/29/2011  . Diabetic gastroparesis ([place]) 10/29/2011  . Weight loss, abnormal 10/29/2011    Orientation RESPIRATION BLADDER Height & Weight     Self, Place  Normal Incontinent Weight: 135 lb 9.3 oz (61.5 kg) Height:  5\' 8"  (172.7 cm)  BEHAVIORAL SYMPTOMS/MOOD NEUROLOGICAL BOWEL NUTRITION STATUS  Other (Comment) (none)  (n/a) Incontinent Diet (Carb modified. See d/c summary for updates.)  AMBULATORY STATUS COMMUNICATION OF NEEDS Skin   Total Care Verbally Skin abrasions, Bruising                       Personal Care Assistance Level of Assistance   Bathing, Feeding, Dressing Bathing Assistance: Maximum assistance Feeding assistance: Limited assistance Dressing Assistance: Maximum assistance     Functional Limitations Info  Sight, Hearing, Speech Sight Info: Impaired Hearing Info: Impaired Speech Info: Adequate    SPECIAL CARE FACTORS FREQUENCY                       Contractures      Additional Factors Info  Insulin Sliding Scale Code Status Info: Full code Allergies Info: No known allergies           Current Medications (02/20/2017):  This is the current hospital active medication list Current Facility-Administered Medications  Medication Dose Route Frequency Provider Last Rate Last Dose  . 0.9 %  sodium chloride infusion   Intravenous Continuous Roney Jaffe, MD 150 mL/hr at 02/20/17 1345    . acetaminophen (TYLENOL) tablet 650 mg  650 mg Oral Q6H PRN Roney Jaffe, MD       Or  . acetaminophen (TYLENOL) suppository 650 mg  650 mg Rectal Q6H PRN Roney Jaffe, MD      . aspirin EC tablet 81 mg  81 mg Oral Daily Roney Jaffe, MD   81 mg at 02/20/17 0840  . citalopram (CELEXA) tablet 20 mg  20 mg Oral q morning - 10a Roney Jaffe, MD   20 mg at 02/20/17 0840  . ezetimibe (ZETIA) tablet 5 mg  5 mg Oral QHS Roney Jaffe, MD   5 mg at 02/20/17 0007  . feeding supplement (ENSURE ENLIVE) (  ENSURE ENLIVE) liquid 237 mL  237 mL Oral BID BM Apadmits [facility], MD   237 mL at 02/20/17 1000  . feeding supplement (PRO-STAT SUGAR FREE 64) liquid 30 mL  30 mL Oral BID Erline Hau, MD      . insulin aspart (novoLOG) injection 0-9 Units  0-9 Units Subcutaneous Q4H Roney Jaffe, MD   2 Units at 02/20/17 0540  . insulin glargine (LANTUS) injection 7 Units  7 Units Subcutaneous QHS Roney Jaffe, MD   7 Units at 02/20/17 0007  . latanoprost (XALATAN) 0.005 % ophthalmic solution 1 drop  1 drop Both Eyes QHS Roney Jaffe, MD   1 drop at 02/20/17 0007  . multivitamin with minerals tablet 1 tablet  1 tablet  Oral Daily Estela Leonie Green, MD      . ondansetron [facility]) tablet 4 mg  4 mg Oral Q6H PRN Roney Jaffe, MD       Or  . ondansetron [facility] injection 4 mg  4 mg Intravenous Q6H PRN Roney Jaffe, MD      . pantoprazole (PROTONIX) EC tablet 40 mg  40 mg Oral Daily Roney Jaffe, MD   40 mg at 02/20/17 0840  . rivaroxaban (XARELTO) tablet 20 mg  20 mg Oral Q supper Roney Jaffe, MD   20 mg at 02/20/17 0042  . vitamin B-12 (CYANOCOBALAMIN) tablet 1,000 mcg  1,000 mcg Oral Daily Roney Jaffe, MD   1,000 mcg at 02/20/17 0840     Discharge Medications: Please see discharge summary for a list of discharge medications.  Relevant Imaging Results:  Relevant Lab Results:   Additional Information SSN: [id number]  [name], [place]

## 2017-02-20 NOTE — Clinical Social Work Note (Signed)
Clinical Social Work Assessment  Patient Details  Name: Eduardo Lee MRN: [id number] Date of Birth: 19-May-1926  Date of referral:  02/20/17               Reason for consult:  Discharge Planning                Permission sought to share information with:    Permission granted to share information::     Name::        Agency::     Relationship::     Contact Information:     Housing/Transportation Living arrangements for the past 2 months:  Single Family Home Source of Information:  Adult Children Patient Interpreter Needed:  None Criminal Activity/Legal Involvement Pertinent to Current Situation/Hospitalization:  No - Comment as needed Significant Relationships:  Adult Children Lives with:  Self Do you feel safe going back to the place where you live?  No Need for family participation in patient care:  Yes (Comment)  Care giving concerns:  Family unable to provide around the clock care for pt.    Social Worker assessment / plan:  CSW met with pt's son, Richardson Landry at bedside. Pt resting during visit and did not contribute to assessment. Richardson Landry states that pt was recently living with his girlfriend who was also helping with his care. He broke his femur on Saturday and since Monday has been very confused. Family unable to care for pt at this time at home. Discussed placement and ALF vs SNF. Son agrees SNF is most appropriate and is aware it would be private pay due to observation status. Pt has been to [facility] in the past and this is preference. CSW will send referral.   Employment status:  Retired Forensic scientist:  Medicare PT Recommendations:  Not assessed at this time Information / Referral to community resources:  [place]  Patient/Family's Response to care:  Pt's son acknowledges that pt will require placement at d/c.   Patient/Family's Understanding of and Emotional Response to Diagnosis, Current Treatment, and Prognosis:  Pt's son aware of admission diagnosis and  that pt is likely stable for d/c today.   Emotional Assessment Appearance:  Appears stated age Attitude/Demeanor/Rapport:  Unable to Assess Affect (typically observed):  Unable to Assess Orientation:  Oriented to Self Alcohol / Substance use:  Not Applicable Psych involvement (Current and /or in the community):  No (Comment)  Discharge Needs  Concerns to be addressed:  Discharge Planning Concerns Readmission within the last 30 days:  No Current discharge risk:  Physical Impairment Barriers to Discharge:  No Barriers Identified   [name], [place] 02/20/2017, 2:33 PM [phone]

## 2017-02-20 NOTE — Telephone Encounter (Signed)
Patient went to the ER yesterday.  They are going to release him today around 5 pm and Pete's family does not think he should be released today and wants to know if there is anything that Dr. Nicki Reaper can do to help with this matter.

## 2017-02-20 NOTE — Progress Notes (Signed)
Initial Nutrition Assessment  INTERVENTION:  Ensure Enlive po BID, each supplement provides 350 kcal and 20 grams of protein   ProStat 30 ml TID (each 30 ml provides 100 kcal, 15 gr protein)   Assist with feeding all meals until pt is able to feed himself again   NUTRITION DIAGNOSIS:   Inadequate oral intake related to acute illness as evidenced by meal completion < 50%, per patient/family report.   GOAL:   Patient will meet greater than or equal to 90% of their needs   MONITOR:   PO intake, Supplement acceptance, Labs, Weight trends, I & O's  REASON FOR ASSESSMENT:   Malnutrition Screening Tool    ASSESSMENT:  Our patient is an 83 yo with altered mental status, dehydrated, anemic and hyperglycemic. His hx also includes recent fall and fx of left femur. He has a cast on and is fidgeting with his sheets. His son is here and seems anxious that his dad's mental status is so different than baseline. The patient has not eaten much at all the past 2 days per son. Home diet is regular. His meal intake here also shows poor appetite 0-40% of meals thus far. Normally the patient feeds himself but unlikely able to at this time. He did consume about 50% of his Ensure Enlive  this morning.  The patient is not urinating as expected and bladder scan is here to evaluate. He is receiving IV-NS @ 150 ml/hr. No output documented.  His weight hx is stable over the past year 59-61 kg. His BMI is low-normal range.  Recent Labs Lab 02/19/17 1541 02/20/17 0557  NA 134* 140  K 4.6 4.2  CL 98* 105  CO2 23 23  BUN 32* 25*  CREATININE 1.36* 1.12  CALCIUM 8.8* 8.6*  GLUCOSE 406* 194*   Labs: sodium -improved from 134 and  glucose 194 improved from 406 yesterday morning labs  Nutrition-Focused physical exam completed. Findings are mild fat depletion, mild and moderate muscle depletion, and no edema. Some muscle loss expected with normal aging process. He is at risk for malnutrition given his poor  po currently and acute altered mental status.     Diet Order:  Diet Carb Modified Fluid consistency: Thin; Room service appropriate? Yes  Skin:  Reviewed, no issues  Last BM:  3/21  Height:   Ht Readings from Last 1 Encounters:  02/19/17 5\' 8"  (1.727 m)    Weight:   Wt Readings from Last 1 Encounters:  02/19/17 135 lb 9.3 oz (61.5 kg)    Ideal Body Weight:   BMI:  Body mass index is 20.62 kg/m.  Estimated Nutritional Needs:   Kcal:  1700-1900   Protein:  75-80 gr  Fluid:  1.6 liters daily  EDUCATION NEEDS:   Education needs no appropriate at this time  Colman Cater MS,RD,CSG,LDN Office: [phone] Pager: [phone]

## 2017-02-20 NOTE — Telephone Encounter (Signed)
Please disregard.  They are admitting him to the [facility].

## 2017-02-20 NOTE — Progress Notes (Signed)
Sanjuan Dame discharged to [facility] facility per MD order.  Discharge instructions reviewed and discussed with the patient's family, all questions and concerns answered. Copy of instructions given to family. Report called to [place] at 1730.  Allergies as of 02/20/2017   No Known Allergies     Medication List    STOP taking these medications   ALEVE 220 MG tablet Generic drug:  naproxen sodium   BD INSULIN SYRINGE ULTRAFINE 31G X 5/16" 0.3 ML Misc Generic drug:  Insulin Syringe-Needle U-100   guaiFENesin 600 MG 12 hr tablet Commonly known as:  MUCINEX   HYDROcodone-acetaminophen 5-325 MG tablet Commonly known as:  NORCO/VICODIN   TRESIBA FLEXTOUCH 100 UNIT/ML Sopn FlexTouch Pen Generic drug:  insulin degludec   UNIFINE PENTIPS 31G X 8 MM Misc Generic drug:  Insulin Pen Needle     TAKE these medications   aspirin 81 MG tablet Take 81 mg by mouth every morning.   citalopram 20 MG tablet Commonly known as:  CELEXA Take 1 tablet (20 mg total) by mouth every morning.   esomeprazole 40 MG capsule Commonly known as:  NEXIUM Take 40 mg by mouth daily as needed. Reported on 05/10/2016   ezetimibe 10 MG tablet Commonly known as:  ZETIA Take 1 tablet (10 mg total) by mouth at bedtime. What changed:  how much to take   HUMALOG KWIKPEN 100 UNIT/ML KiwkPen Generic drug:  insulin lispro Inject 3-5 Units into the skin 3 (three) times daily before meals. Inject 5-6 units 3 times daily   insulin glargine 100 UNIT/ML injection Commonly known as:  LANTUS Inject 0.07 mLs (7 Units total) into the skin at bedtime.   latanoprost 0.005 % ophthalmic solution Commonly known as:  XALATAN Place 1 drop into both eyes at bedtime.   multivitamin with minerals Tabs tablet Take 1 tablet by mouth daily. Start taking on:  02/21/2017   polyethylene glycol powder powder Commonly known as:  GLYCOLAX/MIRALAX Take 34 g by mouth every morning.   rivaroxaban 20 MG  Tabs tablet Commonly known as:  XARELTO Take 1 tablet (20 mg total) by mouth daily with supper.   vitamin B-12 1000 MCG tablet Commonly known as:  CYANOCOBALAMIN Take 1,000 mcg by mouth daily.   Vitamin D (Ergocalciferol) 50000 units Caps capsule Commonly known as:  DRISDOL Take 50,000 Units by mouth every 7 (seven) days.        IV site discontinued and catheter remains intact. Site without signs and symptoms of complications. Dressing and pressure applied.  Patient escorted [facility] via bed by RN and NT,  no distress noted upon discharge.  Ralene Muskrat Marielys Trinidad 02/20/2017 6:30 PM

## 2017-02-20 NOTE — Clinical Social Work Placement (Signed)
   CLINICAL SOCIAL WORK PLACEMENT  NOTE  Date:  02/20/2017  Patient Details  Name: Eduardo Lee MRN: [id number] Date of Birth: 06/10/1926  Clinical Social Work is seeking post-discharge placement for this patient at the [place] level of care (*CSW will initial, date and re-position this form in  chart as items are completed):  Yes   Patient/family provided with [facility] Work Department's list of facilities offering this level of care within the geographic area requested by the patient (or if unable, by the patient's family).  Yes   Patient/family informed of their freedom to choose among providers that offer the needed level of care, that participate in Medicare, Medicaid or managed care program needed by the patient, have an available bed and are willing to accept the patient.  Yes   Patient/family informed of [facility]'s ownership interest in [facility] and [facility], as well as of the fact that they are under no obligation to receive care at these facilities.  PASRR submitted to EDS on 02/20/17     PASRR number received on 02/20/17     Existing PASRR number confirmed on       FL2 transmitted to all facilities in geographic area requested by pt/family on 02/20/17     FL2 transmitted to all facilities within larger geographic area on       Patient informed that his/her managed care company has contracts with or will negotiate with certain facilities, including the following:        Yes   Patient/family informed of bed offers received.  Patient chooses bed at [facility]     Physician recommends and patient chooses bed at      Patient to be transferred to [facility] on 02/20/17.  Patient to be transferred to facility by staff     Patient family notified on 02/20/17 of transfer.  Name of family member notified:  Berline Chough- sons     PHYSICIAN       Additional Comment:     _______________________________________________ [name], LCSW 02/20/2017, 3:43 PM [phone]

## 2017-02-20 NOTE — Care Management Obs Status (Signed)
[place] NOTIFICATION   Patient Details  Name: Eduardo Lee MRN: [id number] Date of Birth: 1926/06/25   Medicare Observation Status Notification Given:  Yes    Sherald Barge, RN 02/20/2017, 4:39 PM

## 2017-02-20 NOTE — Discharge Summary (Signed)
Physician Discharge Summary  Eduardo Lee CLE:[id number] DOB: 1926-04-08 DOA: 02/19/2017  PCP: Mickie Hillier, MD  Admit date: 02/19/2017 Discharge date: 02/20/2017  Time spent: 45 minutes  Recommendations for Outpatient Follow-up:  -Will be discharged to SNF today for ST-rehab. -Will need to follow up with orthopedics of choice in 2 weeks.   Discharge Diagnoses:  Principal Problem:   Altered mental status Active Problems:   ARF (acute renal failure) (HCC)   Diabetes type 2, uncontrolled (HCC)   Normocytic anemia   Chronic anticoagulation   History of pulmonary embolism./ DVT   Fracture, femur ([place])   Discharge Condition: Stable and improved  Filed Weights   02/19/17 1525 02/19/17 2226  Weight: 59.9 kg (132 lb) 61.5 kg (135 lb 9.3 oz)    History of present illness:  As per Dr. Jonnie Finner on 3/21: Eduardo Lee is a 83 y.o. male with history of CVA, PE, HTN, DVT and IDDM , hx gastroparesis and neuropathy from DM.  Pt was out of town this past weekend visiting a friend in [place], fell and broke the L distal femur reportedly, that was 4 days ago.  2 days ago on Monday he saw ortho MD and got the left leg casted (family says they would have done surgery but pt was "too old").  He took pain meds on Sat and Sun but after casting on Monday didn't take any more pain medications. Came back today and pt's two sons noticed confusion and brought him to ED.  BS here is 400 and creat 1.3.  CT head no acute.  WBC wnl, no fevers, UA pending.  Urine is "dark".  Asked to see for admission.   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  Had 3 sons.  Quit smoking many yrs ago, no etoh history  Hospital Course:   Acute encephalopathy -Suspect multifactorial due to recent tibial fracture with oxycodone use, acute renal failure, uncontrolled diabetes and dehydration as well as sundowning. -Is oriented to person and time, not to place, still with  visual hallucinations.  Uncontrolled diabetes -CBG was in the 400s on admission, improved, will need continued insulin administration at SNF with monitoring and dose adjustments as deemed necessary.  History of DVT/PE -On xarelto.  Recent tibial fracture -Will need outpatient follow-up with orthopedics within 1-2 weeks, he suffered a fracture while in [place] and a cast was placed, has not seen orthopedics in [place].  Acute renal failure -Due to volume depletion from DM, improved with IV fluids.  Weakness/deconditioning -We'll be going to short-term SNF for rehabilitation purposes today.  Procedures:  None   Consultations:  None  Discharge Instructions  Discharge Instructions    Diet - low sodium heart healthy    Complete by:  As directed    Increase activity slowly    Complete by:  As directed      Allergies as of 02/20/2017   No Known Allergies     Medication List    STOP taking these medications   ALEVE 220 MG tablet Generic drug:  naproxen sodium   BD INSULIN SYRINGE ULTRAFINE 31G X 5/16" 0.3 ML Misc Generic drug:  Insulin Syringe-Needle U-100   guaiFENesin 600 MG 12 hr tablet Commonly known as:  MUCINEX   HYDROcodone-acetaminophen 5-325 MG tablet Commonly known as:  NORCO/VICODIN   TRESIBA FLEXTOUCH 100 UNIT/ML Sopn FlexTouch Pen Generic drug:  insulin degludec   UNIFINE PENTIPS 31G  X 8 MM Misc Generic drug:  Insulin Pen Needle     TAKE these medications   aspirin 81 MG tablet Take 81 mg by mouth every morning.   citalopram 20 MG tablet Commonly known as:  CELEXA Take 1 tablet (20 mg total) by mouth every morning.   esomeprazole 40 MG capsule Commonly known as:  NEXIUM Take 40 mg by mouth daily as needed. Reported on 05/10/2016   ezetimibe 10 MG tablet Commonly known as:  ZETIA Take 1 tablet (10 mg total) by mouth at bedtime. What changed:  how much to take   HUMALOG KWIKPEN 100 UNIT/ML KiwkPen Generic drug:  insulin  lispro Inject 3-5 Units into the skin 3 (three) times daily before meals. Inject 5-6 units 3 times daily   insulin glargine 100 UNIT/ML injection Commonly known as:  LANTUS Inject 0.07 mLs (7 Units total) into the skin at bedtime.   latanoprost 0.005 % ophthalmic solution Commonly known as:  XALATAN Place 1 drop into both eyes at bedtime.   multivitamin with minerals Tabs tablet Take 1 tablet by mouth daily. Start taking on:  02/21/2017   polyethylene glycol powder powder Commonly known as:  GLYCOLAX/MIRALAX Take 34 g by mouth every morning.   rivaroxaban 20 MG Tabs tablet Commonly known as:  XARELTO Take 1 tablet (20 mg total) by mouth daily with supper.   vitamin B-12 1000 MCG tablet Commonly known as:  CYANOCOBALAMIN Take 1,000 mcg by mouth daily.   Vitamin D (Ergocalciferol) 50000 units Caps capsule Commonly known as:  DRISDOL Take 50,000 Units by mouth every 7 (seven) days.      No Known Allergies    The results of significant diagnostics from this hospitalization (including imaging, microbiology, ancillary and laboratory) are listed below for reference.    Significant Diagnostic Studies: Ct Head Wo Contrast  Result Date: 02/19/2017 CLINICAL DATA:  Altered mental status with history of fall EXAM: CT HEAD WITHOUT CONTRAST TECHNIQUE: Contiguous axial images were obtained from the base of the skull through the vertex without intravenous contrast. COMPARISON:  10/29/2011 FINDINGS: Brain: No acute territorial infarction, hemorrhage or mass is visualized. Moderate diffuse atrophy. Mild to moderate periventricular and subcortical white matter small vessel ischemic changes. Old lacunar infarcts within the thalamus and right basal ganglia. Enlarged ventricles are stable in size and felt secondary to atrophy. Vascular: No hyperdense vessels. Carotid artery calcifications and vertebral artery calcifications. Skull: No fracture.  No suspicious bone lesion. Sinuses/Orbits: Mucosal  thickening in the ethmoid and maxillary sinuses. Bony remottling of the left maxillary sinus wall consistent with chronic sinusitis. No acute orbital abnormality. Other: None IMPRESSION: 1. No CT evidence for acute intracranial abnormality 2. Atrophy with mild to moderate white matter small vessel ischemic changes. Electronically Signed   By: Donavan Foil M.D.   On: 02/19/2017 16:57    Microbiology: No results found for this or any previous visit (from the past 240 hour(s)).   Labs: Basic Metabolic Panel:  Recent Labs Lab 02/19/17 1541 02/20/17 0557  NA 134* 140  K 4.6 4.2  CL 98* 105  CO2 23 23  GLUCOSE 406* 194*  BUN 32* 25*  CREATININE 1.36* 1.12  CALCIUM 8.8* 8.6*   Liver Function Tests:  Recent Labs Lab 02/19/17 1541  AST 22  ALT 16*  ALKPHOS 93  BILITOT 1.2  PROT 6.3*  ALBUMIN 3.3*   No results for input(s): LIPASE, AMYLASE in the last 168 hours. No results for input(s): AMMONIA in the last 168 hours.  CBC:  Recent Labs Lab 02/19/17 1541 02/20/17 0557  WBC 7.7 6.5  HGB 10.4* 10.1*  HCT 30.5* 30.1*  MCV 92.1 92.0  PLT 273 308   Cardiac Enzymes: No results for input(s): CKTOTAL, CKMB, CKMBINDEX, TROPONINI in the last 168 hours. BNP: BNP (last 3 results) No results for input(s): BNP in the last 8760 hours.  ProBNP (last 3 results) No results for input(s): PROBNP in the last 8760 hours.  CBG:  Recent Labs Lab 02/19/17 2132 02/19/17 2225 02/20/17 0045 02/20/17 0525 02/20/17 0759  GLUCAP 306* 357* 348* 197* 145*       Signed:  Lelon Frohlich  [facility] Pager: [phone] 02/20/2017, 3:43 PM

## 2017-02-21 ENCOUNTER — Other Ambulatory Visit: Payer: Self-pay | Admitting: *Deleted

## 2017-02-21 ENCOUNTER — Telehealth: Payer: Self-pay | Admitting: Family Medicine

## 2017-02-21 ENCOUNTER — Other Ambulatory Visit (HOSPITAL_COMMUNITY)
Admission: AD | Admit: 2017-02-21 | Discharge: 2017-02-21 | Disposition: A | Discharge status: Home / Self Care | Payer: Medicare Other | Source: Skilled Nursing Facility | Attending: Internal Medicine | Admitting: Internal Medicine

## 2017-02-21 ENCOUNTER — Encounter: Payer: Self-pay | Admitting: Internal Medicine

## 2017-02-21 ENCOUNTER — Non-Acute Institutional Stay (SKILLED_NURSING_FACILITY): Payer: Medicare Other | Admitting: Internal Medicine

## 2017-02-21 DIAGNOSIS — E1165 Type 2 diabetes mellitus with hyperglycemia: Secondary | ICD-10-CM | POA: Diagnosis not present

## 2017-02-21 DIAGNOSIS — R739 Hyperglycemia, unspecified: Secondary | ICD-10-CM

## 2017-02-21 DIAGNOSIS — N179 Acute kidney failure, unspecified: Secondary | ICD-10-CM | POA: Diagnosis not present

## 2017-02-21 DIAGNOSIS — R4182 Altered mental status, unspecified: Secondary | ICD-10-CM

## 2017-02-21 DIAGNOSIS — S82102G Unspecified fracture of upper end of left tibia, subsequent encounter for closed fracture with delayed healing: Secondary | ICD-10-CM

## 2017-02-21 LAB — CBC WITH DIFFERENTIAL/PLATELET
Basophils Absolute: 0 10*3/uL (ref 0.0–0.1)
Basophils Relative: 0 %
EOS ABS: 0.1 10*3/uL (ref 0.0–0.7)
Eosinophils Relative: 1 %
HCT: 27.3 % — ABNORMAL LOW (ref 39.0–52.0)
HEMOGLOBIN: 9.2 g/dL — AB (ref 13.0–17.0)
Lymphocytes Relative: 8 %
Lymphs Abs: 0.7 10*3/uL (ref 0.7–4.0)
MCH: 30.9 pg (ref 26.0–34.0)
MCHC: 33.7 g/dL (ref 30.0–36.0)
MCV: 91.6 fL (ref 78.0–100.0)
Monocytes Absolute: 0.7 10*3/uL (ref 0.1–1.0)
Monocytes Relative: 8 %
NEUTROS PCT: 83 %
Neutro Abs: 6.7 10*3/uL (ref 1.7–7.7)
Platelets: 317 10*3/uL (ref 150–400)
RBC: 2.98 MIL/uL — AB (ref 4.22–5.81)
RDW: 14.7 % (ref 11.5–15.5)
WBC: 8.1 10*3/uL (ref 4.0–10.5)

## 2017-02-21 LAB — URINALYSIS, ROUTINE W REFLEX MICROSCOPIC
BILIRUBIN URINE: NEGATIVE
Bacteria, UA: NONE SEEN
Glucose, UA: >=500 mg/dL — AB
Ketones, ur: 20 mg/dL — AB
Leukocytes, UA: NEGATIVE
Nitrite: NEGATIVE
PH: 5 (ref 5.0–8.0)
Protein, ur: NEGATIVE mg/dL
SPECIFIC GRAVITY, URINE: 1.023 (ref 1.005–1.030)

## 2017-02-21 LAB — BASIC METABOLIC PANEL
Anion gap: 8 (ref 5–15)
BUN: 26 mg/dL — ABNORMAL HIGH (ref 6–20)
CALCIUM: 9.2 mg/dL (ref 8.9–10.3)
CHLORIDE: 106 mmol/L (ref 101–111)
CO2: 28 mmol/L (ref 22–32)
CREATININE: 1.13 mg/dL (ref 0.61–1.24)
GFR calc non Af Amer: 56 mL/min — ABNORMAL LOW (ref >60)
Glucose, Bld: 228 mg/dL — ABNORMAL HIGH (ref 65–99)
Potassium: 3.3 mmol/L — ABNORMAL LOW (ref 3.5–5.1)
SODIUM: 142 mmol/L (ref 135–145)

## 2017-02-21 MED ORDER — OXYCODONE-ACETAMINOPHEN 5-325 MG PO TABS: 1.0000 | ORAL_TABLET | Freq: Three times a day (TID) | ORAL | 0 refills | Status: DC | PRN

## 2017-02-21 NOTE — Progress Notes (Signed)
Location:   [place] Room Number: 139/D Place of Service:  SNF [phone]) Provider:  Geronimo Running, MD  Patient Care Team: Mikey Kirschner, MD as PCP - General  Extended Emergency Contact Information Primary Emergency Contact: Whitebread,Mike Address: [place], [place] Phone: [phone] Mobile Phone: [phone] Relation: Son Secondary Emergency Contact: Eduard Clos [place] Phone: [phone] Relation: Son  Code Status:  Full Code Goals of care: Advanced Directive information Advanced Directives 02/21/2017  Does Patient Have a Medical Advance Directive? Yes  Type of Advance Directive (No Data)  Does patient want to make changes to medical advance directive? No - Patient declined  Copy of [place] in Chart? -  Pre-existing out of facility DNR order (yellow form or pink MOST form) -     Chief Complaint  Patient presents with  . Acute Visit  Status post hospitalization status post question distal femur fracture tibia fracture.  With acute encephalopathy as well as uncontrolled diabetes and renal insufficiency.    HPI:  Pt is a 83 y.o. male seen today for an acute visit for follow-up of hospitalization for the above issues.  Patient does have a history CVA pulmonary embolism DVT hypertension and insulin-dependent diabetes as well as gastroparesis and neuropathy.  Family was out of town in [place] when he fell and broke his left distal femur-history is somewhat unclear but apparently surgical release on orthopedic M.D. who got the left leg casted-apparently surgery was deferred because of patient's advanced age although this is somewhat unclear.  He had increased confusion and sexually was brought to the ER apparently at [facility].  His blood sugar was found to be 400 and creatinine is 1.3.  CT of the head did not show any acute  process and did show old Kuehner infarcts.  Blood work apparently was fairly normal otherwise.  His acute encephalopathy eventually was thought to be multifactorial with possible pain medication use as well as acute renal failure uncontrolled diabetes dehydration and sundowning.  Apparently he still has some visual hallucinations.  Initially CBG was in the 400s that did improve he does have sliding scale insulin is also on Lantus.  Regards to the fracture is also listed as a recent tibial fracture he will need orthopedic follow-up again he does have a cast placed has not seen a local orthopedic physician it appears.  His renal insufficiency improved with IV fluids creatinine on discharge was 1.12.  He is here for rehabilitation and strengthening with deconditioning.  Apparently he had significant confusion trying to get out of bed last night family did stay with him he currently has a sitter with him apparently this morning he was sleeping-by the time I arrived in the room however around noon he was awake he was alert and bright somewhat confused.  He could not really tell me where he was which apparently according to his sitter is not his baseline.  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  I also had a discussion with patient's primary care physician Dr. Celestia Khat  apparently was thinking that possibly he should get rehabilitation at home-           Past Medical History:  Diagnosis Date  .  Diabetes mellitus   . DVT (deep venous thrombosis) ([place])   . Gastroparesis   . Hypertension   . Neuropathy ([place])   . Osteoporosis   . Pulmonary embolism ([place]) 12/2010  . Stroke ([place])   . Weight loss    Past Surgical History:  Procedure Laterality Date  . ANKLE SURGERY     Patient states that he had pins place in the left foot  . BACK SURGERY  2008  . CARPAL TUNNEL  RELEASE  08/2011   Left hand  . COLONOSCOPY  01/2011  . ORIF HIP FRACTURE  10/31/2011   Procedure: OPEN REDUCTION INTERNAL FIXATION HIP;  Surgeon: Arther Abbott, MD;  Location: [facility];  Service: Orthopedics;  Laterality: Right;  with Gamma Nail  . TRANSURETHRAL RESECTION OF PROSTATE  1984  . UPPER GASTROINTESTINAL ENDOSCOPY  01/2011    No Known Allergies  Allergies as of 02/21/2017   No Known Allergies     Medication List       Accurate as of 02/21/17 11:04 AM. Always use your most recent med list.          aspirin 81 MG tablet Take 81 mg by mouth every morning.   citalopram 20 MG tablet Commonly known as:  CELEXA Take 1 tablet (20 mg total) by mouth every morning.   esomeprazole 40 MG capsule Commonly known as:  NEXIUM Take 40 mg by mouth daily as needed. Reported on 05/10/2016   ezetimibe 10 MG tablet Commonly known as:  ZETIA Take 1 tablet (10 mg total) by mouth at bedtime.   insulin glargine 100 UNIT/ML injection Commonly known as:  LANTUS Inject 0.07 mLs (7 Units total) into the skin at bedtime.   latanoprost 0.005 % ophthalmic solution Commonly known as:  XALATAN Place 1 drop into both eyes at bedtime.   multivitamin with minerals Tabs tablet Take 1 tablet by mouth daily.   NOVOLOG FLEXPEN 100 UNIT/ML FlexPen Generic drug:  insulin aspart Give per sliding scale   oxyCODONE-acetaminophen 5-325 MG tablet Commonly known as:  PERCOCET/ROXICET Take 1 tablet by mouth every 8 (eight) hours as needed for severe pain.   polyethylene glycol powder powder Commonly known as:  GLYCOLAX/MIRALAX Take 34 g by mouth every morning.   rivaroxaban 20 MG Tabs tablet Commonly known as:  XARELTO Take 1 tablet (20 mg total) by mouth daily with supper.   vitamin B-12 1000 MCG tablet Commonly known as:  CYANOCOBALAMIN Take 1,000 mcg by mouth daily.   Vitamin D (Ergocalciferol) 50000 units Caps capsule Commonly known as:  DRISDOL Take 50,000 Units by mouth every 7  (seven) days.       Review of Systems   This is somewhat limited since patient somewhat confused.  Provide by nursing as well.  Genitals no complaints of fever chills.  Skin not complete rashes or itching.  Head ears eyes nose mouth and throat does not complaining visual changes or sore throat.  Respiratory does not complain of cough or shortness of breath.  Cardiac does not complain of chest pain I do not really note lower extremity edema.  GI he has not been eating but now says he's hungry does not complain of abdominal pain.  GU he has an indwelling Foley catheter apparently distention was noted by nursing staff last night catheter was inserted and they eventually obtained almost 2000 mL of urine.  Muscle skeletal does have a cast applied to his left leg is not really complaining of joint pain or leg pain  at this time.  Hologic is not complaining of dizziness headache syncope does have confusion.   psych again has had increased confusion per discussion with his sitter-apparently this is relatively new onset since his fracture.    Immunization History  Administered Date(s) Administered  . Influenza Split 08/31/2013  . Influenza,inj,Quad PF,36+ Mos 09/07/2014, 09/12/2015  . Influenza-Unspecified 09/02/2011, 10/02/2016  . Pneumococcal Conjugate-13 06/07/2014  . Pneumococcal Polysaccharide-23 09/01/2001  . Td 06/01/2009  . Tdap 04/26/2016  . Zoster 05/23/2009   Pertinent  Health Maintenance Due  Topic Date Due  . URINE MICROALBUMIN  02/20/1936  . HEMOGLOBIN A1C  06/10/2016  . OPHTHALMOLOGY EXAM  06/12/2016  . FOOT EXAM  04/08/2017  . INFLUENZA VACCINE  Completed  . PNA vac Low Risk Adult  Completed   Fall Risk  06/10/2016  Falls in the past year? Yes  Number falls in past yr: 1  Injury with Fall? Yes  Risk Factor Category  High Fall Risk  Risk for fall due to : Impaired vision  Follow up Falls evaluation completed;Education provided;Falls prevention discussed     Functional Status Survey:    Vitals:   02/21/17 1103  BP: 138/69  Pulse: (!) 102  Resp: (!) 24  Temp: 98 F (36.7 C)  TempSrc: Oral  SpO2: 95%    Physical Exam In general this is a frail appearing elderly male in no distress lying in bed he is alert and answers questions smiling and pleasant.  His skin is warm and dry.  Eyes pupils appear reactive light sclera and none for clear he does have some history of blindness with limited visual acuity.  Oropharynx is clear mucous membranes appear slightly dry.  Chest is clear to auscultation there is no labored breathing.  Heart is regular rate and rhythm in the high 90s he does not have significant lower extremity edema.  Abdomen is soft with positive bowel sounds could not really appreciate tenderness.  GU could not appreciate any suprapubic distention does have a Foley catheter in place draining a large amount of amber colored urine  Muscle skeletal does have his left leg in a hard cast he is able to wiggle his left toes which are exposed capillary refill is intact is able to move his other extremities strength appears to be relatively intact in all 3.  Neurologic is grossly intact to speech is clear he does have some confusion.  Psych as noted above he does have confusion does not really know worries that but was able to give me somewhat of an accurate history of having a fracture in [place] his birthday was yesterday knew that Daisy Floro was president-- He followed simple verbal commands without difficulty Labs reviewed:  Recent Labs  02/19/17 1541 02/20/17 0557  NA 134* 140  K 4.6 4.2  CL 98* 105  CO2 23 23  GLUCOSE 406* 194*  BUN 32* 25*  CREATININE 1.36* 1.12  CALCIUM 8.8* 8.6*    Recent Labs  05/07/16 0949 02/19/17 1541  AST 17 22  ALT 12 16*  ALKPHOS 171* 93  BILITOT 0.4 1.2  PROT 6.5 6.3*  ALBUMIN 3.8 3.3*    Recent Labs  02/19/17 1541 02/20/17 0557  WBC 7.7 6.5  HGB  10.4* 10.1*  HCT 30.5* 30.1*  MCV 92.1 92.0  PLT 273 308   No results found for: TSH Lab Results  Component Value Date   HGBA1C 8.5 09/27/2013   Lab Results  Component Value Date   CHOL  208 (H) 05/07/2016   HDL 72 05/07/2016   LDLCALC 119 (H) 05/07/2016   TRIG 86 05/07/2016   CHOLHDL 2.9 05/07/2016    Significant Diagnostic Results in last 30 days:  Ct Head Wo Contrast  Result Date: 02/19/2017 CLINICAL DATA:  Altered mental status with history of fall EXAM: CT HEAD WITHOUT CONTRAST TECHNIQUE: Contiguous axial images were obtained from the base of the skull through the vertex without intravenous contrast. COMPARISON:  10/29/2011 FINDINGS: Brain: No acute territorial infarction, hemorrhage or mass is visualized. Moderate diffuse atrophy. Mild to moderate periventricular and subcortical white matter small vessel ischemic changes. Old lacunar infarcts within the thalamus and right basal ganglia. Enlarged ventricles are stable in size and felt secondary to atrophy. Vascular: No hyperdense vessels. Carotid artery calcifications and vertebral artery calcifications. Skull: No fracture.  No suspicious bone lesion. Sinuses/Orbits: Mucosal thickening in the ethmoid and maxillary sinuses. Bony remottling of the left maxillary sinus wall consistent with chronic sinusitis. No acute orbital abnormality. Other: None IMPRESSION: 1. No CT evidence for acute intracranial abnormality 2. Atrophy with mild to moderate white matter small vessel ischemic changes. Electronically Signed   By: Donavan Foil M.D.   On: 02/19/2017 16:57    Assessment/Plan  #1-history of altered mental status again this was thought to be multifactorial I also had an extensive discussion with patient's primary care provider Dr. Wolfgang Phoenix via phone-there is thought there could be some element of early dementia here contributing as well as possibly medications used in pain management   Also thought to be possibly contributing was his high  blood sugar and renal insufficiency and dehydration.  Apparently he was quite somnolent this morning but by noon he was awake alert certainly answering questions as noted above.  Family apparently has some thought possibly patient would do better at home-I discussed this again with Dr. Wolfgang Phoenix and is thought it would probably be better if he could stay here for some monitoring at least in the short-term and then go home with extensive family support home health support 24 hour care I suspect.  There also was some thought of possibly adding antipsychotic her appetite stimulant however at this point would like to defer to we get to know him better and see if possibly being in more stable familiar surroundings will help with the mental status changes.  Also will need updated lab work including a CBC and metabolic panel.  #2 diabetes insulin-dependent-CBG was in the 400s on admission it is around 300 at noon today he is on Lantus as well as NovoLog sliding scale at this point will monitor again I suspect this could be monitored a bit more closely here in skilled nursing versus home.  #3 history of DVT and pulmonary embolism in the past she continues on Xarelto.  #4 history of tibial fracture???-distal femoral fracture? He has a cast on again information on this is quite sketchy will attempt to get orthopedic consult as soon as we can do follow-up on this he does not appear to be having significant pain I do note he does have noted for Percocet when necessary this will have to be monitored and given judiciously.  #5 history of acute renal failure again he was hydrated and hospital creatinine did improve to 1.12 update labs have been ordered.  #6 history of anemia this appears to be stable hemoglobin 10.1 on lab done yesterday again we will update this.  #7 history of depression continues on Celexa.  #8 history of urinary retention  again he now has an indwelling Foley catheter secondary to significant  retention of urine culture also has been obtained and results are pending at this point will keep in the catheter  short-term secondary to concerns of urinary retention   there is some thought that possibly this may have been contributing to his agitation confusion last night-per nursing apparently once  catheter was inserted and urine was obtained that he was able to rest and fell asleep    [id number]-of note greater than 1 hour spent assessing patient-discussing his status with nursing staff as well as with his primary care physician-reviewing his chart-reviewing his labs-and coordinating and formulating a plan of care-of note greater than 50% coordinating plan of care

## 2017-02-21 NOTE — Telephone Encounter (Signed)
Daughter-in-law-Susan wanting you to review records from patient stay at [facility] and now is at [facility].Some of them dont think he should be there and want your opinion on it. Contact Susan-[phone]

## 2017-02-21 NOTE — Telephone Encounter (Signed)
[facility] #[phone] Fax: [phone]   

## 2017-02-22 ENCOUNTER — Encounter (HOSPITAL_COMMUNITY)
Admission: RE | Admit: 2017-02-22 | Discharge: 2017-02-22 | Disposition: A | Discharge status: Home / Self Care | Payer: Medicare Other | Source: Other Acute Inpatient Hospital | Attending: Internal Medicine | Admitting: Internal Medicine

## 2017-02-22 DIAGNOSIS — I10 Essential (primary) hypertension: Secondary | ICD-10-CM | POA: Diagnosis not present

## 2017-02-22 LAB — CBC WITH DIFFERENTIAL/PLATELET
Basophils Absolute: 0 10*3/uL (ref 0.0–0.1)
Basophils Relative: 0 %
Eosinophils Absolute: 0 10*3/uL (ref 0.0–0.7)
Eosinophils Relative: 0 %
HCT: 30.4 % — ABNORMAL LOW (ref 39.0–52.0)
HEMOGLOBIN: 10 g/dL — AB (ref 13.0–17.0)
LYMPHS ABS: 0.8 10*3/uL (ref 0.7–4.0)
Lymphocytes Relative: 8 %
MCH: 31.2 pg (ref 26.0–34.0)
MCHC: 32.9 g/dL (ref 30.0–36.0)
MCV: 94.7 fL (ref 78.0–100.0)
Monocytes Absolute: 0.6 10*3/uL (ref 0.1–1.0)
Monocytes Relative: 6 %
NEUTROS PCT: 86 %
Neutro Abs: 8.4 10*3/uL — ABNORMAL HIGH (ref 1.7–7.7)
Platelets: 321 10*3/uL (ref 150–400)
RBC: 3.21 MIL/uL — AB (ref 4.22–5.81)
RDW: 14.9 % (ref 11.5–15.5)
WBC: 9.9 10*3/uL (ref 4.0–10.5)

## 2017-02-22 LAB — URINE CULTURE
Culture: <10000 — AB
Culture: NO GROWTH

## 2017-02-22 LAB — BASIC METABOLIC PANEL
Anion gap: 19 — ABNORMAL HIGH (ref 5–15)
BUN: 32 mg/dL — AB (ref 6–20)
CHLORIDE: 101 mmol/L (ref 101–111)
CO2: 17 mmol/L — ABNORMAL LOW (ref 22–32)
Calcium: 8.8 mg/dL — ABNORMAL LOW (ref 8.9–10.3)
Creatinine, Ser: 1.5 mg/dL — ABNORMAL HIGH (ref 0.61–1.24)
GFR calc Af Amer: 46 mL/min — ABNORMAL LOW (ref >60)
GFR calc non Af Amer: 40 mL/min — ABNORMAL LOW (ref >60)
GLUCOSE: 470 mg/dL — AB (ref 65–99)
POTASSIUM: 4.6 mmol/L (ref 3.5–5.1)
Sodium: 137 mmol/L (ref 135–145)

## 2017-02-24 ENCOUNTER — Encounter: Payer: Self-pay | Admitting: Internal Medicine

## 2017-02-24 ENCOUNTER — Telehealth: Payer: Self-pay | Admitting: Orthopedic Surgery

## 2017-02-24 ENCOUNTER — Non-Acute Institutional Stay (SKILLED_NURSING_FACILITY): Payer: Medicare Other | Admitting: Internal Medicine

## 2017-02-24 DIAGNOSIS — E1065 Type 1 diabetes mellitus with hyperglycemia: Secondary | ICD-10-CM | POA: Diagnosis not present

## 2017-02-24 DIAGNOSIS — Z86711 Personal history of pulmonary embolism: Secondary | ICD-10-CM

## 2017-02-24 DIAGNOSIS — Z9181 History of falling: Secondary | ICD-10-CM | POA: Diagnosis not present

## 2017-02-24 DIAGNOSIS — E1111 Type 2 diabetes mellitus with ketoacidosis with coma: Secondary | ICD-10-CM

## 2017-02-24 DIAGNOSIS — S72402S Unspecified fracture of lower end of left femur, sequela: Secondary | ICD-10-CM

## 2017-02-24 DIAGNOSIS — Z794 Long term (current) use of insulin: Secondary | ICD-10-CM | POA: Diagnosis not present

## 2017-02-24 DIAGNOSIS — S72141D Displaced intertrochanteric fracture of right femur, subsequent encounter for closed fracture with routine healing: Secondary | ICD-10-CM | POA: Diagnosis not present

## 2017-02-24 DIAGNOSIS — M6281 Muscle weakness (generalized): Secondary | ICD-10-CM | POA: Diagnosis not present

## 2017-02-24 DIAGNOSIS — D649 Anemia, unspecified: Secondary | ICD-10-CM | POA: Diagnosis not present

## 2017-02-24 DIAGNOSIS — R279 Unspecified lack of coordination: Secondary | ICD-10-CM | POA: Diagnosis not present

## 2017-02-24 NOTE — Telephone Encounter (Signed)
Would you please look at the notes in the system for this patient and advise if you will see him. He has a fx L Tibia and saw a ortho MD in [place] on 02/17/17 and got casted. He was seen at [facility] and sent to [facility], with discharge instructions to follow up with Ortho of his choice. Will you need the notes from [place]? Please advise.

## 2017-02-24 NOTE — Progress Notes (Signed)
Provider:  Veleta Miners Location:   [place] Room Number: 139/D Place of Service:  SNF (31)  PCP: Mickie Hillier, MD Patient Care Team: Eduardo Kirschner, MD as PCP - General  Extended Emergency Contact Information Primary Emergency Contact: Eduardo Lee,Eduardo Lee Address: [place], [place] Phone: [phone] Mobile Phone: [phone] Relation: Son Secondary Emergency Contact: Eduardo Lee [place] of [place] Phone: [phone] Relation: Son  Code Status: DNR Goals of Care: Advanced Directive information Advanced Directives 02/24/2017  Does Patient Have a Medical Advance Directive? Yes  Type of Advance Directive Out of facility DNR (pink MOST or yellow form)  Does patient want to make changes to medical advance directive? No - Patient declined  Copy of [place] in Chart? -  Pre-existing out of facility DNR order (yellow form or pink MOST form) -      Chief Complaint  Patient presents with  . New Admit To SNF    HPI: Patient is a 83 y.o. male seen today for admission to SNF for therapy.  Patient has h/o IDDM With Neuropathy,followed by Dr [place], Chronic Kidney disaese Stage 3 , Hyperlipidemia, Vit D deficiency, Gastroparesis, S/P Hip fracture in 11/12 and 2014 with osteoporosis treated before, DVT with PE in 2012 on chronic Anticoagulation, Diabetic retinopathy.    He was travelling to [place] and had a fall and had Left distal Femur Fracture.  Ortho decided to put cast as they thought he was not surgical candidate.Eduardo Lee He was send home with Pain meds. Family Brought him to the ED with increased confusion and hyperglycemia. His CT scan was negative for any Acute process. There is no signs of infection. His Confusion was thought to be due to Pain meds and Hyperglycemia. He was admitted only for observation. He was discharged to SNF for further Rehab.   Patient according to the  nurses was very confused and lethargic initially but now has been more awake. Talked to his sitter and she thinks he is near his baseline. Patient is legally blind.But does remember falling and knows that he is in [facility].  Before the fall patient was living at home with companion and sitter if needed. He walks with the walker. His Daughter in law takes care of his finances. He has 24 hour care at home. He denied any pain or swelling in the leg. No Nausea or vomiting.  Past Medical History:  Diagnosis Date  . Diabetes mellitus   . DVT (deep venous thrombosis) ([place])   . Gastroparesis   . Hypertension   . Neuropathy ([place])   . Osteoporosis   . Pulmonary embolism ([place]) 12/2010  . Stroke ([place])   . Weight loss    Past Surgical History:  Procedure Laterality Date  . ANKLE SURGERY     Patient states that he had pins place in the left foot  . BACK SURGERY  2008  . CARPAL TUNNEL RELEASE  08/2011   Left hand  . COLONOSCOPY  01/2011  . ORIF HIP FRACTURE  10/31/2011   Procedure: OPEN REDUCTION INTERNAL FIXATION HIP;  Surgeon: Arther Abbott, MD;  Location: [facility];  Service: Orthopedics;  Laterality: Right;  with Gamma Nail  . TRANSURETHRAL RESECTION OF PROSTATE  1984  . UPPER GASTROINTESTINAL ENDOSCOPY  01/2011    reports that he quit smoking about 35 years ago. His smoking use included Cigarettes. He has never used smokeless  tobacco. He reports that he drinks alcohol. He reports that he does not use drugs. Social History   Social History  . Marital status: Divorced    Spouse name: N/A  . Number of children: N/A  . Years of education: N/A   Occupational History  . Not on file.   Social History Main Topics  . Smoking status: Former Smoker    Types: Cigarettes    Quit date: 10/27/1981  . Smokeless tobacco: Never Used  . Alcohol use 0.0 oz/week     Comment: Drinks Scotch 3 times per week prior to [employer]- hasn't drank x 1 month  . Drug use: No  . Sexual activity: No   Other  Topics Concern  . Not on file   Social History Narrative  . No narrative on file    Functional Status Survey:    Family History  Problem Relation Age of Onset  . Diabetes Mother   . Diabetes Father   . Diabetes Sister   . Diabetes Brother   . Diabetes Sister   . Diabetes Son   . Healthy Son   . Healthy Son     Health Maintenance  Topic Date Due  . URINE MICROALBUMIN  02/20/1936  . HEMOGLOBIN A1C  06/10/2016  . OPHTHALMOLOGY EXAM  06/12/2016  . FOOT EXAM  04/08/2017  . TETANUS/TDAP  04/26/2026  . INFLUENZA VACCINE  Completed  . PNA vac Low Risk Adult  Completed    No Known Allergies  Allergies as of 02/24/2017   No Known Allergies     Medication List       Accurate as of 02/24/17  9:39 AM. Always use your most recent med list.          aspirin 81 MG tablet Take 81 mg by mouth every morning.   citalopram 20 MG tablet Commonly known as:  CELEXA Take 1 tablet (20 mg total) by mouth every morning.   esomeprazole 40 MG capsule Commonly known as:  NEXIUM Take 40 mg by mouth daily as needed. Reported on 05/10/2016   ezetimibe 10 MG tablet Commonly known as:  ZETIA Take 1 tablet (10 mg total) by mouth at bedtime.   insulin glargine 100 UNIT/ML injection Commonly known as:  LANTUS Inject 0.07 mLs (7 Units total) into the skin at bedtime.   latanoprost 0.005 % ophthalmic solution Commonly known as:  XALATAN Place 1 drop into both eyes at bedtime.   multivitamin with minerals Tabs tablet Take 1 tablet by mouth daily.   NOVOLOG FLEXPEN 100 UNIT/ML FlexPen Generic drug:  insulin aspart Give per sliding scale   oxyCODONE-acetaminophen 5-325 MG tablet Commonly known as:  PERCOCET/ROXICET Take 1 tablet by mouth every 8 (eight) hours as needed for severe pain.   polyethylene glycol powder powder Commonly known as:  GLYCOLAX/MIRALAX Take 34 g by mouth every morning.   potassium chloride SA 20 MEQ tablet Commonly known as:  K-DUR,KLOR-CON Take 20 mEq by  mouth daily.   rivaroxaban 20 MG Tabs tablet Commonly known as:  XARELTO Take 1 tablet (20 mg total) by mouth daily with supper.   SANTYL ointment Generic drug:  collagenase Apply to coccyx wound per tx orders   VENELEX Oint Apply to right buttocks and sacrum for prevention every shift   vitamin B-12 1000 MCG tablet Commonly known as:  CYANOCOBALAMIN Take 1,000 mcg by mouth daily.   Vitamin D (Ergocalciferol) 50000 units Caps capsule Commonly known as:  DRISDOL Take 50,000 Units by mouth every  7 (seven) days.       Review of Systems  Review of Systems  Constitutional: Negative for activity change, appetite change, chills, diaphoresis, fatigue and fever.  HENT: Negative for mouth sores, postnasal drip, rhinorrhea, sinus pain and sore throat.   Respiratory: Negative for apnea, cough, chest tightness, shortness of breath and wheezing.   Cardiovascular: Negative for chest pain, palpitations and leg swelling.  Gastrointestinal: Negative for abdominal distention, abdominal pain, constipation, diarrhea, nausea and vomiting.  Genitourinary: Negative for dysuria and frequency.  Musculoskeletal: Negative for arthralgias, joint swelling and myalgias.  Skin: Negative for rash.  Neurological: Negative for dizziness, syncope, weakness, light-headedness and numbness.  Psychiatric/Behavioral: Negative for behavioral problems, confusion and sleep disturbance.     Vitals:   02/24/17 0938  BP: (!) 95/57 repeat manually was 130/70  Pulse: 85  Resp: 20  Temp: 97.4 F (36.3 C)  TempSrc: Oral   There is no height or weight on file to calculate BMI. Physical Exam  Constitutional: He is oriented to person, place, and time. He appears well-developed and well-nourished.  HENT:  Head: Normocephalic.  Mouth/Throat: Oropharynx is clear and moist.  Eyes: Pupils are equal, round, and reactive to light.  Neck: Neck supple.  Cardiovascular: Normal rate, regular rhythm and normal heart sounds.     Pulmonary/Chest: Effort normal and breath sounds normal. No respiratory distress. He has no wheezes. He has no rales.  Abdominal: Soft. Bowel sounds are normal. He exhibits no distension. There is no tenderness. There is no rebound.  Musculoskeletal: He exhibits no edema.  Neurological: He is alert and oriented to person, place, and time.  Patient was oriented and did not have any focal deficits.  Skin: Skin is warm and dry.  Psychiatric: He has a normal mood and affect. His behavior is normal. Thought content normal.    Labs reviewed: Basic Metabolic Panel:  Recent Labs  02/20/17 0557 02/21/17 1411 02/22/17 0730  NA 140 142 137  K 4.2 3.3* 4.6  CL 105 106 101  CO2 23 28 17*  GLUCOSE 194* 228* 470*  BUN 25* 26* 32*  CREATININE 1.12 1.13 1.50*  CALCIUM 8.6* 9.2 8.8*   Liver Function Tests:  Recent Labs  05/07/16 0949 02/19/17 1541  AST 17 22  ALT 12 16*  ALKPHOS 171* 93  BILITOT 0.4 1.2  PROT 6.5 6.3*  ALBUMIN 3.8 3.3*   No results for input(s): LIPASE, AMYLASE in the last 8760 hours. No results for input(s): AMMONIA in the last 8760 hours. CBC:  Recent Labs  02/20/17 0557 02/21/17 1411 02/22/17 0730  WBC 6.5 8.1 9.9  NEUTROABS  --  6.7 8.4*  HGB 10.1* 9.2* 10.0*  HCT 30.1* 27.3* 30.4*  MCV 92.0 91.6 94.7  PLT 308 317 321   Cardiac Enzymes: No results for input(s): CKTOTAL, CKMB, CKMBINDEX, TROPONINI in the last 8760 hours. BNP: Invalid input(s): POCBNP Lab Results  Component Value Date   HGBA1C 8.5 09/27/2013   No results found for: TSH Lab Results  Component Value Date   VITAMINB12 382 12/05/2010   No results found for: FOLATE No results found for: IRON, TIBC, FERRITIN  Imaging and Procedures obtained prior to SNF admission: No results found.  Assessment/Plan  Closed fracture of distal end of left femur I have d/w the nurses again patient has not seen Ortho in town and wants to see  Dr Aline Brochure to get his input if patient will heal  without Surgery. At this time pain is controlled on PRN  percocet. Therapy has not been started yet.  Altered Mental status Possible Delirium sec to Fracture and Poain Meds. Doing much better now. Continue Nursing care. Patient family wants to take him Home eventually with 24 hour care.  Diabetes Mellitus Type 1 Will restart his [place] and d/c Lantus. Also start him on 3 units of Humalog before each Meal.  Normocytic anemia Possible due to CRI Will get iron studies and Occult blood  History of pulmonary embolism./ DVT Continue Xarelto  Chronic renal Insufficiency Creat was slightly elevated in the hospital and he was given IV fluids. Will repeat Labs  Urinary retention Foley is now out. Will continue to monitor for retention.   Family/ staff Communication:   Labs/tests ordered:

## 2017-02-25 ENCOUNTER — Encounter (HOSPITAL_COMMUNITY)
Admission: AD | Admit: 2017-02-25 | Discharge: 2017-02-25 | Disposition: A | Discharge status: Home / Self Care | Payer: Medicare Other | Source: Skilled Nursing Facility | Attending: *Deleted | Admitting: *Deleted

## 2017-02-25 DIAGNOSIS — Z9181 History of falling: Secondary | ICD-10-CM | POA: Diagnosis not present

## 2017-02-25 DIAGNOSIS — R279 Unspecified lack of coordination: Secondary | ICD-10-CM | POA: Diagnosis not present

## 2017-02-25 DIAGNOSIS — M6281 Muscle weakness (generalized): Secondary | ICD-10-CM | POA: Diagnosis not present

## 2017-02-25 DIAGNOSIS — S72141D Displaced intertrochanteric fracture of right femur, subsequent encounter for closed fracture with routine healing: Secondary | ICD-10-CM | POA: Diagnosis not present

## 2017-02-25 LAB — BASIC METABOLIC PANEL
ANION GAP: 18 — AB (ref 5–15)
BUN: 32 mg/dL — AB (ref 6–20)
CHLORIDE: 96 mmol/L — AB (ref 101–111)
CO2: 22 mmol/L (ref 22–32)
Calcium: 8.9 mg/dL (ref 8.9–10.3)
Creatinine, Ser: 1.3 mg/dL — ABNORMAL HIGH (ref 0.61–1.24)
GFR calc Af Amer: 54 mL/min — ABNORMAL LOW (ref >60)
GFR calc non Af Amer: 47 mL/min — ABNORMAL LOW (ref >60)
GLUCOSE: 440 mg/dL — AB (ref 65–99)
POTASSIUM: 4.8 mmol/L (ref 3.5–5.1)
Sodium: 136 mmol/L (ref 135–145)

## 2017-02-25 LAB — CBC
HEMATOCRIT: 30.8 % — AB (ref 39.0–52.0)
HEMOGLOBIN: 10.2 g/dL — AB (ref 13.0–17.0)
MCH: 31 pg (ref 26.0–34.0)
MCHC: 33.1 g/dL (ref 30.0–36.0)
MCV: 93.6 fL (ref 78.0–100.0)
Platelets: 402 10*3/uL — ABNORMAL HIGH (ref 150–400)
RBC: 3.29 MIL/uL — AB (ref 4.22–5.81)
RDW: 15.2 % (ref 11.5–15.5)
WBC: 8.4 10*3/uL (ref 4.0–10.5)

## 2017-02-25 NOTE — Telephone Encounter (Signed)
April 24 th will need xrays ordered at the [facility] before he comes

## 2017-02-26 ENCOUNTER — Inpatient Hospital Stay (HOSPITAL_COMMUNITY)
Admission: EM | Admit: 2017-02-26 | Discharge: 2017-03-01 | DRG: 698 | Disposition: A | Discharge status: Skilled Nursing Facility | Payer: Medicare Other | Attending: Internal Medicine | Admitting: Internal Medicine

## 2017-02-26 ENCOUNTER — Encounter (HOSPITAL_COMMUNITY): Payer: Self-pay | Admitting: Emergency Medicine

## 2017-02-26 ENCOUNTER — Non-Acute Institutional Stay (SKILLED_NURSING_FACILITY): Payer: Medicare Other | Admitting: Internal Medicine

## 2017-02-26 ENCOUNTER — Emergency Department (HOSPITAL_COMMUNITY): Payer: Medicare Other

## 2017-02-26 ENCOUNTER — Encounter: Payer: Self-pay | Admitting: Internal Medicine

## 2017-02-26 DIAGNOSIS — W1830XD Fall on same level, unspecified, subsequent encounter: Secondary | ICD-10-CM | POA: Diagnosis not present

## 2017-02-26 DIAGNOSIS — M810 Age-related osteoporosis without current pathological fracture: Secondary | ICD-10-CM | POA: Diagnosis present

## 2017-02-26 DIAGNOSIS — Z86718 Personal history of other venous thrombosis and embolism: Secondary | ICD-10-CM

## 2017-02-26 DIAGNOSIS — N183 Chronic kidney disease, stage 3 (moderate): Secondary | ICD-10-CM | POA: Diagnosis present

## 2017-02-26 DIAGNOSIS — E1149 Type 2 diabetes mellitus with other diabetic neurological complication: Secondary | ICD-10-CM | POA: Diagnosis not present

## 2017-02-26 DIAGNOSIS — G9340 Encephalopathy, unspecified: Secondary | ICD-10-CM | POA: Diagnosis not present

## 2017-02-26 DIAGNOSIS — Z86711 Personal history of pulmonary embolism: Secondary | ICD-10-CM | POA: Diagnosis not present

## 2017-02-26 DIAGNOSIS — R279 Unspecified lack of coordination: Secondary | ICD-10-CM | POA: Diagnosis not present

## 2017-02-26 DIAGNOSIS — S7290XA Unspecified fracture of unspecified femur, initial encounter for closed fracture: Secondary | ICD-10-CM | POA: Diagnosis present

## 2017-02-26 DIAGNOSIS — E1122 Type 2 diabetes mellitus with diabetic chronic kidney disease: Secondary | ICD-10-CM

## 2017-02-26 DIAGNOSIS — R400 Somnolence: Secondary | ICD-10-CM | POA: Diagnosis not present

## 2017-02-26 DIAGNOSIS — I10 Essential (primary) hypertension: Secondary | ICD-10-CM | POA: Diagnosis not present

## 2017-02-26 DIAGNOSIS — E7800 Pure hypercholesterolemia, unspecified: Secondary | ICD-10-CM | POA: Diagnosis present

## 2017-02-26 DIAGNOSIS — S82102A Unspecified fracture of upper end of left tibia, initial encounter for closed fracture: Secondary | ICD-10-CM

## 2017-02-26 DIAGNOSIS — B961 Klebsiella pneumoniae [K. pneumoniae] as the cause of diseases classified elsewhere: Secondary | ICD-10-CM | POA: Diagnosis present

## 2017-02-26 DIAGNOSIS — Z833 Family history of diabetes mellitus: Secondary | ICD-10-CM

## 2017-02-26 DIAGNOSIS — S82102G Unspecified fracture of upper end of left tibia, subsequent encounter for closed fracture with delayed healing: Secondary | ICD-10-CM | POA: Diagnosis not present

## 2017-02-26 DIAGNOSIS — E1165 Type 2 diabetes mellitus with hyperglycemia: Secondary | ICD-10-CM

## 2017-02-26 DIAGNOSIS — E1042 Type 1 diabetes mellitus with diabetic polyneuropathy: Secondary | ICD-10-CM | POA: Diagnosis present

## 2017-02-26 DIAGNOSIS — I129 Hypertensive chronic kidney disease with stage 1 through stage 4 chronic kidney disease, or unspecified chronic kidney disease: Secondary | ICD-10-CM | POA: Diagnosis present

## 2017-02-26 DIAGNOSIS — A4159 Other Gram-negative sepsis: Secondary | ICD-10-CM

## 2017-02-26 DIAGNOSIS — E1022 Type 1 diabetes mellitus with diabetic chronic kidney disease: Secondary | ICD-10-CM | POA: Diagnosis present

## 2017-02-26 DIAGNOSIS — R1312 Dysphagia, oropharyngeal phase: Secondary | ICD-10-CM | POA: Diagnosis not present

## 2017-02-26 DIAGNOSIS — Z9181 History of falling: Secondary | ICD-10-CM | POA: Diagnosis not present

## 2017-02-26 DIAGNOSIS — S82832A Other fracture of upper and lower end of left fibula, initial encounter for closed fracture: Secondary | ICD-10-CM

## 2017-02-26 DIAGNOSIS — G909 Disorder of the autonomic nervous system, unspecified: Secondary | ICD-10-CM | POA: Diagnosis not present

## 2017-02-26 DIAGNOSIS — K3184 Gastroparesis: Secondary | ICD-10-CM | POA: Diagnosis present

## 2017-02-26 DIAGNOSIS — E1043 Type 1 diabetes mellitus with diabetic autonomic (poly)neuropathy: Secondary | ICD-10-CM | POA: Diagnosis present

## 2017-02-26 DIAGNOSIS — G9341 Metabolic encephalopathy: Secondary | ICD-10-CM

## 2017-02-26 DIAGNOSIS — L8990 Pressure ulcer of unspecified site, unspecified stage: Secondary | ICD-10-CM | POA: Diagnosis present

## 2017-02-26 DIAGNOSIS — H548 Legal blindness, as defined in USA: Secondary | ICD-10-CM | POA: Diagnosis present

## 2017-02-26 DIAGNOSIS — E860 Dehydration: Secondary | ICD-10-CM | POA: Diagnosis present

## 2017-02-26 DIAGNOSIS — N390 Urinary tract infection, site not specified: Secondary | ICD-10-CM | POA: Diagnosis not present

## 2017-02-26 DIAGNOSIS — I4891 Unspecified atrial fibrillation: Secondary | ICD-10-CM | POA: Diagnosis present

## 2017-02-26 DIAGNOSIS — S72141D Displaced intertrochanteric fracture of right femur, subsequent encounter for closed fracture with routine healing: Secondary | ICD-10-CM | POA: Diagnosis not present

## 2017-02-26 DIAGNOSIS — Z794 Long term (current) use of insulin: Secondary | ICD-10-CM

## 2017-02-26 DIAGNOSIS — Z7901 Long term (current) use of anticoagulants: Secondary | ICD-10-CM

## 2017-02-26 DIAGNOSIS — T83511A Infection and inflammatory reaction due to indwelling urethral catheter, initial encounter: Principal | ICD-10-CM | POA: Diagnosis present

## 2017-02-26 DIAGNOSIS — E1065 Type 1 diabetes mellitus with hyperglycemia: Secondary | ICD-10-CM

## 2017-02-26 DIAGNOSIS — A419 Sepsis, unspecified organism: Secondary | ICD-10-CM | POA: Diagnosis present

## 2017-02-26 DIAGNOSIS — E1143 Type 2 diabetes mellitus with diabetic autonomic (poly)neuropathy: Secondary | ICD-10-CM | POA: Diagnosis not present

## 2017-02-26 DIAGNOSIS — Y846 Urinary catheterization as the cause of abnormal reaction of the patient, or of later complication, without mention of misadventure at the time of the procedure: Secondary | ICD-10-CM | POA: Diagnosis present

## 2017-02-26 DIAGNOSIS — Z8673 Personal history of transient ischemic attack (TIA), and cerebral infarction without residual deficits: Secondary | ICD-10-CM

## 2017-02-26 DIAGNOSIS — S8290XD Unspecified fracture of unspecified lower leg, subsequent encounter for closed fracture with routine healing: Secondary | ICD-10-CM | POA: Diagnosis not present

## 2017-02-26 DIAGNOSIS — S82122A Displaced fracture of lateral condyle of left tibia, initial encounter for closed fracture: Secondary | ICD-10-CM | POA: Diagnosis not present

## 2017-02-26 DIAGNOSIS — Z7982 Long term (current) use of aspirin: Secondary | ICD-10-CM

## 2017-02-26 DIAGNOSIS — S82142D Displaced bicondylar fracture of left tibia, subsequent encounter for closed fracture with routine healing: Secondary | ICD-10-CM

## 2017-02-26 DIAGNOSIS — E1129 Type 2 diabetes mellitus with other diabetic kidney complication: Secondary | ICD-10-CM | POA: Diagnosis not present

## 2017-02-26 DIAGNOSIS — Z87891 Personal history of nicotine dependence: Secondary | ICD-10-CM

## 2017-02-26 DIAGNOSIS — L89150 Pressure ulcer of sacral region, unstageable: Secondary | ICD-10-CM | POA: Diagnosis present

## 2017-02-26 DIAGNOSIS — E876 Hypokalemia: Secondary | ICD-10-CM | POA: Diagnosis present

## 2017-02-26 DIAGNOSIS — R4182 Altered mental status, unspecified: Secondary | ICD-10-CM

## 2017-02-26 DIAGNOSIS — Z66 Do not resuscitate: Secondary | ICD-10-CM | POA: Diagnosis present

## 2017-02-26 DIAGNOSIS — G8929 Other chronic pain: Secondary | ICD-10-CM | POA: Diagnosis not present

## 2017-02-26 DIAGNOSIS — D6489 Other specified anemias: Secondary | ICD-10-CM | POA: Diagnosis not present

## 2017-02-26 DIAGNOSIS — M80051D Age-related osteoporosis with current pathological fracture, right femur, subsequent encounter for fracture with routine healing: Secondary | ICD-10-CM | POA: Diagnosis not present

## 2017-02-26 DIAGNOSIS — S82209A Unspecified fracture of shaft of unspecified tibia, initial encounter for closed fracture: Secondary | ICD-10-CM

## 2017-02-26 DIAGNOSIS — E10319 Type 1 diabetes mellitus with unspecified diabetic retinopathy without macular edema: Secondary | ICD-10-CM | POA: Diagnosis present

## 2017-02-26 DIAGNOSIS — S7292XA Unspecified fracture of left femur, initial encounter for closed fracture: Secondary | ICD-10-CM

## 2017-02-26 DIAGNOSIS — M6281 Muscle weakness (generalized): Secondary | ICD-10-CM | POA: Diagnosis not present

## 2017-02-26 DIAGNOSIS — R159 Full incontinence of feces: Secondary | ICD-10-CM | POA: Diagnosis present

## 2017-02-26 DIAGNOSIS — D72829 Elevated white blood cell count, unspecified: Secondary | ICD-10-CM | POA: Diagnosis not present

## 2017-02-26 DIAGNOSIS — A414 Sepsis due to anaerobes: Secondary | ICD-10-CM

## 2017-02-26 DIAGNOSIS — R634 Abnormal weight loss: Secondary | ICD-10-CM | POA: Diagnosis not present

## 2017-02-26 DIAGNOSIS — Z79899 Other long term (current) drug therapy: Secondary | ICD-10-CM

## 2017-02-26 DIAGNOSIS — E1140 Type 2 diabetes mellitus with diabetic neuropathy, unspecified: Secondary | ICD-10-CM | POA: Diagnosis not present

## 2017-02-26 HISTORY — DX: Legal blindness, as defined in USA: H54.8

## 2017-02-26 LAB — CBC WITH DIFFERENTIAL/PLATELET
BASOS ABS: 0 10*3/uL (ref 0.0–0.1)
Basophils Relative: 0 %
EOS PCT: 1 %
Eosinophils Absolute: 0.1 10*3/uL (ref 0.0–0.7)
HCT: 32.1 % — ABNORMAL LOW (ref 39.0–52.0)
Hemoglobin: 10.7 g/dL — ABNORMAL LOW (ref 13.0–17.0)
LYMPHS PCT: 6 %
Lymphs Abs: 0.8 10*3/uL (ref 0.7–4.0)
MCH: 31 pg (ref 26.0–34.0)
MCHC: 33.3 g/dL (ref 30.0–36.0)
MCV: 93 fL (ref 78.0–100.0)
Monocytes Absolute: 1.1 10*3/uL — ABNORMAL HIGH (ref 0.1–1.0)
Monocytes Relative: 8 %
Neutro Abs: 12 10*3/uL — ABNORMAL HIGH (ref 1.7–7.7)
Neutrophils Relative %: 85 %
PLATELETS: 463 10*3/uL — AB (ref 150–400)
RBC: 3.45 MIL/uL — AB (ref 4.22–5.81)
RDW: 15 % (ref 11.5–15.5)
WBC: 14.1 10*3/uL — AB (ref 4.0–10.5)

## 2017-02-26 LAB — URINALYSIS, ROUTINE W REFLEX MICROSCOPIC
Bilirubin Urine: NEGATIVE
Glucose, UA: 150 mg/dL — AB
Ketones, ur: 5 mg/dL — AB
Nitrite: NEGATIVE
Protein, ur: 30 mg/dL — AB
SPECIFIC GRAVITY, URINE: 1.024 (ref 1.005–1.030)
pH: 5 (ref 5.0–8.0)

## 2017-02-26 LAB — PROCALCITONIN: Procalcitonin: 0.13 ng/mL

## 2017-02-26 LAB — BLOOD GAS, VENOUS
ACID-BASE EXCESS: 7.6 mmol/L — AB (ref 0.0–2.0)
BICARBONATE: 30.8 mmol/L — AB (ref 20.0–28.0)
FIO2: 0.21
O2 SAT: 90.7 %
PATIENT TEMPERATURE: 37
pCO2, Ven: 49.7 mmHg (ref 44.0–60.0)
pH, Ven: 7.426 (ref 7.250–7.430)
pO2, Ven: 61.4 mmHg — ABNORMAL HIGH (ref 32.0–45.0)

## 2017-02-26 LAB — COMPREHENSIVE METABOLIC PANEL
ALT: 13 U/L — ABNORMAL LOW (ref 17–63)
ANION GAP: 7 (ref 5–15)
AST: 20 U/L (ref 15–41)
Albumin: 2.7 g/dL — ABNORMAL LOW (ref 3.5–5.0)
Alkaline Phosphatase: 139 U/L — ABNORMAL HIGH (ref 38–126)
BILIRUBIN TOTAL: 0.8 mg/dL (ref 0.3–1.2)
BUN: 29 mg/dL — AB (ref 6–20)
CO2: 33 mmol/L — ABNORMAL HIGH (ref 22–32)
Calcium: 8.9 mg/dL (ref 8.9–10.3)
Chloride: 98 mmol/L — ABNORMAL LOW (ref 101–111)
Creatinine, Ser: 1.1 mg/dL (ref 0.61–1.24)
GFR, EST NON AFRICAN AMERICAN: 57 mL/min — AB (ref >60)
Glucose, Bld: 235 mg/dL — ABNORMAL HIGH (ref 65–99)
POTASSIUM: 4.1 mmol/L (ref 3.5–5.1)
Sodium: 138 mmol/L (ref 135–145)
TOTAL PROTEIN: 5.4 g/dL — AB (ref 6.5–8.1)

## 2017-02-26 LAB — APTT: APTT: 42 s — AB (ref 24–36)

## 2017-02-26 LAB — CBG MONITORING, ED: Glucose-Capillary: 229 mg/dL — ABNORMAL HIGH (ref 65–99)

## 2017-02-26 LAB — PROTIME-INR
INR: 2.63
Prothrombin Time: 28.6 seconds — ABNORMAL HIGH (ref 11.4–15.2)

## 2017-02-26 LAB — LACTIC ACID, PLASMA
LACTIC ACID, VENOUS: 1 mmol/L (ref 0.5–1.9)
Lactic Acid, Venous: 1.3 mmol/L (ref 0.5–1.9)

## 2017-02-26 LAB — I-STAT CG4 LACTIC ACID, ED: LACTIC ACID, VENOUS: 2.75 mmol/L — AB (ref 0.5–1.9)

## 2017-02-26 LAB — I-STAT TROPONIN, ED: TROPONIN I, POC: 0.01 ng/mL (ref 0.00–0.08)

## 2017-02-26 MED ORDER — SODIUM CHLORIDE 0.9 % IV BOLUS (SEPSIS)
500.0000 mL | Freq: Once | INTRAVENOUS | Status: AC
  Administered 2017-02-26: 500 mL via INTRAVENOUS

## 2017-02-26 MED ORDER — ONDANSETRON HCL 4 MG/2ML IJ SOLN: 4.0000 mg | Freq: Four times a day (QID) | INTRAMUSCULAR | Status: DC | PRN

## 2017-02-26 MED ORDER — ACETAMINOPHEN 650 MG RE SUPP: 650.0000 mg | Freq: Four times a day (QID) | RECTAL | Status: DC | PRN

## 2017-02-26 MED ORDER — INSULIN ASPART 100 UNIT/ML ~~LOC~~ SOLN: 0.0000 [IU] | Freq: Three times a day (TID) | SUBCUTANEOUS | Status: DC

## 2017-02-26 MED ORDER — SODIUM CHLORIDE 0.9 % IV SOLN
Freq: Once | INTRAVENOUS | Status: AC
  Administered 2017-02-26: 15:00:00 via INTRAVENOUS

## 2017-02-26 MED ORDER — RIVAROXABAN 20 MG PO TABS
20.0000 mg | ORAL_TABLET | Freq: Every day | ORAL | Status: DC
Start: 2017-02-26 — End: 2017-03-01
  Administered 2017-02-26 – 2017-02-28 (×3): 20 mg via ORAL
  Filled 2017-02-26 (×3): qty 1

## 2017-02-26 MED ORDER — ACETAMINOPHEN 325 MG PO TABS: 650.0000 mg | ORAL_TABLET | Freq: Four times a day (QID) | ORAL | Status: DC | PRN

## 2017-02-26 MED ORDER — VANCOMYCIN HCL 10 G IV SOLR
1250.0000 mg | Freq: Once | INTRAVENOUS | Status: AC
  Administered 2017-02-26: 1250 mg via INTRAVENOUS
  Filled 2017-02-26: qty 1250

## 2017-02-26 MED ORDER — CITALOPRAM HYDROBROMIDE 20 MG PO TABS
20.0000 mg | ORAL_TABLET | Freq: Every morning | ORAL | Status: DC
  Administered 2017-02-27 – 2017-03-01 (×3): 20 mg via ORAL
  Filled 2017-02-26 (×3): qty 1

## 2017-02-26 MED ORDER — LACTATED RINGERS IV SOLN
INTRAVENOUS | Status: DC
  Administered 2017-02-26: 18:00:00 via INTRAVENOUS

## 2017-02-26 MED ORDER — ONDANSETRON HCL 4 MG PO TABS: 4.0000 mg | ORAL_TABLET | Freq: Four times a day (QID) | ORAL | Status: DC | PRN

## 2017-02-26 MED ORDER — PIPERACILLIN-TAZOBACTAM 3.375 G IVPB 30 MIN
3.3750 g | Freq: Once | INTRAVENOUS | Status: AC
  Administered 2017-02-26: 3.375 g via INTRAVENOUS
  Filled 2017-02-26: qty 50

## 2017-02-26 MED ORDER — PIPERACILLIN-TAZOBACTAM 3.375 G IVPB: 3.3750 g | Freq: Three times a day (TID) | INTRAVENOUS | Status: DC

## 2017-02-26 MED ORDER — PANTOPRAZOLE SODIUM 40 MG PO TBEC
80.0000 mg | DELAYED_RELEASE_TABLET | Freq: Every day | ORAL | Status: DC
  Administered 2017-02-27 – 2017-03-01 (×3): 80 mg via ORAL
  Filled 2017-02-26 (×3): qty 2

## 2017-02-26 MED ORDER — DEXTROSE 5 % IV SOLN
2.0000 g | INTRAVENOUS | Status: DC
  Administered 2017-02-26 – 2017-02-28 (×3): 2 g via INTRAVENOUS
  Filled 2017-02-26 (×5): qty 2

## 2017-02-26 MED ORDER — ASPIRIN 81 MG PO CHEW
81.0000 mg | CHEWABLE_TABLET | Freq: Every morning | ORAL | Status: DC
Start: 2017-02-27 — End: 2017-03-01
  Administered 2017-02-27 – 2017-03-01 (×3): 81 mg via ORAL
  Filled 2017-02-26 (×3): qty 1

## 2017-02-26 MED ORDER — LATANOPROST 0.005 % OP SOLN
OPHTHALMIC | Status: AC
  Filled 2017-02-26: qty 2.5

## 2017-02-26 MED ORDER — INSULIN ASPART 100 UNIT/ML ~~LOC~~ SOLN
0.0000 [IU] | Freq: Every day | SUBCUTANEOUS | Status: DC
  Administered 2017-02-26: 4 [IU] via SUBCUTANEOUS

## 2017-02-26 MED ORDER — CHLORHEXIDINE GLUCONATE 0.12 % MT SOLN
15.0000 mL | Freq: Four times a day (QID) | OROMUCOSAL | Status: DC
  Administered 2017-02-26 – 2017-02-28 (×7): 15 mL via OROMUCOSAL
  Filled 2017-02-26 (×6): qty 15

## 2017-02-26 MED ORDER — LATANOPROST 0.005 % OP SOLN
1.0000 [drp] | Freq: Every day | OPHTHALMIC | Status: DC
  Administered 2017-02-26 – 2017-02-28 (×3): 1 [drp] via OPHTHALMIC
  Filled 2017-02-26: qty 2.5

## 2017-02-26 MED ORDER — VANCOMYCIN HCL IN DEXTROSE 1-5 GM/200ML-% IV SOLN: 1000.0000 mg | Freq: Once | INTRAVENOUS | Status: DC

## 2017-02-26 MED ORDER — VANCOMYCIN HCL IN DEXTROSE 1-5 GM/200ML-% IV SOLN: 1000.0000 mg | INTRAVENOUS | Status: DC

## 2017-02-26 MED ORDER — DEXTROSE 5 % IV SOLN
INTRAVENOUS | Status: AC
  Filled 2017-02-26: qty 2

## 2017-02-26 NOTE — ED Notes (Signed)
Pt resting with eyes closed, appears to be in no distress. Respirations are even and unlabored.  

## 2017-02-26 NOTE — H&P (Signed)
History and Physical    DAISON BRAXTON WER:[id number] DOB: February 18, 1926 DOA: 02/26/2017  PCP: Mickie Hillier, MD Consultants:  Endocrinology in [place] Patient coming from: [facility], just brought in Thursday; [place]: son, [phone], [phone]  Chief Complaint: AMS  HPI: KENTARO ALEWINE is a 83 y.o. male with medical history significant of DM, HTN, neuropathy, CVA, and history of DVT/PE presenting with AMS.  He is too somnolent to effectively provide any history and so history is obtained from his son and from [place].  The patient fell and broke his leg(distal femoral fracture) a week ago on Saturday; he was seen at a hospital in [facility] and was referred to orthopedics that Monday.  His family reports that it needs surgical repair but he was not cleared for surgery and so trying to heal without.  Generally is awake and alert, but he has had decreased mental status since Monday when they put the cast on it at an orthopedics office.  Not with it at all, which is very different for him.  No other apparent symptoms.  His family realized that he was unsafe living alone or with a friend in [facility] (his cast extends from his left ankle to his thigh) and so they retrieved him Wednesday (3/22).  They brought him to [facility] and he was observed overnight and then discharged the following day.   Because the family recognized an inability to care for him at home in this situation, they self-paid to have hi admitted to the [facility].  Foley was placed at the [facility] about 0300 Friday; he was very agitated and was found to have 2L urinary retention.  They removed the foley yesterday and he was unable to void, left it 4-5 hours bvefore they replaced it.  No known h/o urinary issues.  With his increasing somnolence and less ability to respond to questions, the PA at the [facility] sent him here for evaluation.   ED Course:  Changed out foley, appears to be UTI.  Meets sepsis criteria with elevated lactate.  Given  Vanc/Zosyn and 1L bolus.  Review of Systems: unable to perform  Ambulatory Status:  Ambulated unsteadily with a walker prior to fall, now not at all  Past Medical History:  Diagnosis Date  . Diabetes mellitus   . DVT (deep venous thrombosis) ([place])   . Gastroparesis   . Hypertension   . Legally blind    diabetic retinopathy  . Neuropathy ([place])   . Osteoporosis   . Pulmonary embolism ([place]) 12/2010   on blood thinners  . Stroke ([place])   . Weight loss     Past Surgical History:  Procedure Laterality Date  . ANKLE SURGERY     Patient states that he had pins place in the left foot  . BACK SURGERY  2008  . CARPAL TUNNEL RELEASE  08/2011   Left hand  . COLONOSCOPY  01/2011  . ORIF HIP FRACTURE  10/31/2011   Procedure: OPEN REDUCTION INTERNAL FIXATION HIP;  Surgeon: Arther Abbott, MD;  Location: [facility];  Service: Orthopedics;  Laterality: Right;  with Gamma Nail  . TRANSURETHRAL RESECTION OF PROSTATE  1984  . UPPER GASTROINTESTINAL ENDOSCOPY  01/2011    Social History   Social History  . Marital status: Divorced    Spouse name: N/A  . Number of children: N/A  . Years of education: N/A   Occupational History  . Not on file.   Social History Main Topics  . Smoking  status: Former Smoker    Years: 20.00    Types: Cigars    Quit date: 10/27/1981  . Smokeless tobacco: Never Used  . Alcohol use No     Comment: Drinks Scotch 3 times per week prior to [employer]- hasn't drank x 1 month  . Drug use: No  . Sexual activity: No   Other Topics Concern  . Not on file   Social History Narrative  . No narrative on file    No Known Allergies  Family History  Problem Relation Age of Onset  . Diabetes Mother   . Diabetes Father   . Diabetes Sister   . Diabetes Brother   . Diabetes Sister   . Diabetes Son   . Healthy Son   . Healthy Son     Prior to Admission medications   Medication Sig Start Date End Date Taking? Authorizing Provider  aspirin 81 MG tablet Take 81 mg  by mouth every morning.    Yes Historical Provider, MD  Janne Lab Oil [facility]) OINT Apply to right buttocks and sacrum for prevention every shift   Yes Historical Provider, MD  citalopram (CELEXA) 20 MG tablet Take 1 tablet (20 mg total) by mouth every morning. 11/11/16  Yes Mikey Kirschner, MD  collagenase (SANTYL) ointment Apply to coccyx wound per tx orders   Yes Historical Provider, MD  esomeprazole (NEXIUM) 40 MG capsule Take 40 mg by mouth daily as needed. Reported on 05/10/2016   Yes Historical Provider, MD  ezetimibe (ZETIA) 10 MG tablet Take 1 tablet (10 mg total) by mouth at bedtime. 11/11/16  Yes Mikey Kirschner, MD  latanoprost (XALATAN) 0.005 % ophthalmic solution Place 1 drop into both eyes at bedtime.    Yes Historical Provider, MD  Multiple Vitamin (MULTIVITAMIN WITH MINERALS) TABS tablet Take 1 tablet by mouth daily. 02/21/17  Yes Estela Leonie Green, MD  oxyCODONE-acetaminophen (PERCOCET/ROXICET) 5-325 MG tablet Take 1 tablet by mouth every 8 (eight) hours as needed for severe pain. 02/21/17  Yes Lauree Chandler, NP  polyethylene glycol powder (GLYCOLAX/MIRALAX) powder Take 34 g by mouth every morning.   Yes Historical Provider, MD  rivaroxaban (XARELTO) 20 MG TABS tablet Take 1 tablet (20 mg total) by mouth daily with supper. 11/11/16  Yes Mikey Kirschner, MD  TRESIBA FLEXTOUCH 100 UNIT/ML SOPN FlexTouch Pen Inject 5 Units into the muscle at bedtime.  02/25/17  Yes Historical Provider, MD  vitamin B-12 (CYANOCOBALAMIN) 1000 MCG tablet Take 1,000 mcg by mouth daily.     Yes Historical Provider, MD  Vitamin D, Ergocalciferol, (DRISDOL) 50000 UNITS CAPS Take 50,000 Units by mouth every 7 (seven) days.    Yes Historical Provider, MD  HUMALOG KWIKPEN 100 UNIT/ML KiwkPen Inject 3 Units into the muscle 3 (three) times daily. Before meals 02/25/17   Historical Provider, MD    Physical Exam: Vitals:   02/26/17 1500 02/26/17 1530 02/26/17 1600 02/26/17 1658  BP: 122/67  96/85 139/61 (!) 132/50  Pulse: 85   88  Resp: (!) 21 18 (!) 24   Temp:    97.9 F (36.6 C)  TempSrc:    Axillary  SpO2: 100%   100%  Weight:    62.6 kg (138 lb)  Height:    5\' 8"  (1.727 m)     General: Appears calm and comfortable and is NAD; snoring quietly and sleeping throughout the encounter although he briefly awakens to stimulation and responds to questions/directives Eyes:   EOMI, normal lids,  iris ENT:  grossly normal hearing, lips & tongue dry, dry mm Neck: no LAD, masses or thyromegaly Cardiovascular: Irregularly irregular but rate controlled, no m/r/g. No LE edema.  Respiratory: CTA bilaterally, no w/r/r. Normal respiratory effort. Abdomen:  soft, ntnd, NABS Skin:  no rash or induration seen on limited exam Musculoskeletal:  grossly normal tone BUE/BLE, good ROM, no bony abnormality Psychiatric:  Able to answer limited questions but extremely somnolent throughout Neurologic:  Unable to perform  Labs on Admission: I have personally reviewed following labs and imaging studies  CBC:  Recent Labs Lab 02/20/17 0557 02/21/17 1411 02/22/17 0730 02/25/17 0700 02/26/17 1252  WBC 6.5 8.1 9.9 8.4 14.1*  NEUTROABS  --  6.7 8.4*  --  12.0*  HGB 10.1* 9.2* 10.0* 10.2* 10.7*  HCT 30.1* 27.3* 30.4* 30.8* 32.1*  MCV 92.0 91.6 94.7 93.6 93.0  PLT 308 317 321 402* 585*   Basic Metabolic Panel:  Recent Labs Lab 02/20/17 0557 02/21/17 1411 02/22/17 0730 02/25/17 0700 02/26/17 1252  NA 140 142 137 136 138  K 4.2 3.3* 4.6 4.8 4.1  CL 105 106 101 96* 98*  CO2 23 28 17* 22 33*  GLUCOSE 194* 228* 470* 440* 235*  BUN 25* 26* 32* 32* 29*  CREATININE 1.12 1.13 1.50* 1.30* 1.10  CALCIUM 8.6* 9.2 8.8* 8.9 8.9   GFR: Estimated Creatinine Clearance: 40.3 mL/min (by C-G formula based on SCr of 1.1 mg/dL). Liver Function Tests:  Recent Labs Lab 02/26/17 1252  AST 20  ALT 13*  ALKPHOS 139*  BILITOT 0.8  PROT 5.4*  ALBUMIN 2.7*   No results for input(s): LIPASE,  AMYLASE in the last 168 hours. No results for input(s): AMMONIA in the last 168 hours. Coagulation Profile:  Recent Labs Lab 02/26/17 1825  INR 2.63   Cardiac Enzymes: No results for input(s): CKTOTAL, CKMB, CKMBINDEX, TROPONINI in the last 168 hours. BNP (last 3 results) No results for input(s): PROBNP in the last 8760 hours. HbA1C: No results for input(s): HGBA1C in the last 72 hours. CBG:  Recent Labs Lab 02/20/17 0045 02/20/17 0525 02/20/17 0759 02/20/17 1636 02/26/17 1250  GLUCAP 348* 197* 145* 325* 229*   Lipid Profile: No results for input(s): CHOL, HDL, LDLCALC, TRIG, CHOLHDL, LDLDIRECT in the last 72 hours. Thyroid Function Tests: No results for input(s): TSH, T4TOTAL, FREET4, T3FREE, THYROIDAB in the last 72 hours. Anemia Panel: No results for input(s): VITAMINB12, FOLATE, FERRITIN, TIBC, IRON, RETICCTPCT in the last 72 hours. Urine analysis:    Component Value Date/Time   COLORURINE YELLOW 02/26/2017 1305   APPEARANCEUR CLOUDY (A) 02/26/2017 1305   LABSPEC 1.024 02/26/2017 1305   PHURINE 5.0 02/26/2017 1305   GLUCOSEU 150 (A) 02/26/2017 1305   HGBUR LARGE (A) 02/26/2017 1305   BILIRUBINUR NEGATIVE 02/26/2017 1305   KETONESUR 5 (A) 02/26/2017 1305   PROTEINUR 30 (A) 02/26/2017 1305   UROBILINOGEN 0.2 10/29/2011 0555   NITRITE NEGATIVE 02/26/2017 1305   LEUKOCYTESUR LARGE (A) 02/26/2017 1305    Creatinine Clearance: Estimated Creatinine Clearance: 40.3 mL/min (by C-G formula based on SCr of 1.1 mg/dL).  Sepsis Labs: @LABRCNTIP (procalcitonin:4,lacticidven:4) ) Recent Results (from the past 240 hour(s))  Culture, Urine     Status: Abnormal   Collection Time: 02/20/17 11:50 AM  Result Value Ref Range Status   Specimen Description URINE, CLEAN CATCH  Final   Special Requests NONE  Final   Culture (A)  Final    <10,000 COLONIES/mL INSIGNIFICANT GROWTH Performed at [facility],  [address], [place], [place] [address]    Report Status  02/22/2017 FINAL  Final  Culture, Urine     Status: None   Collection Time: 02/21/17  3:00 AM  Result Value Ref Range Status   Specimen Description URINE, RANDOM  Final   Special Requests NONE  Final   Culture   Final    NO GROWTH Performed at [facility], [place] [address], [place], [place] [address]    Report Status 02/22/2017 FINAL  Final  Blood Culture (routine x 2)     Status: None (Preliminary result)   Collection Time: 02/26/17  1:52 PM  Result Value Ref Range Status   Specimen Description BLOOD LEFT ANTECUBITAL  Final   Special Requests BOTTLES DRAWN AEROBIC AND ANAEROBIC 10 CC EACH  Final   Culture PENDING  Incomplete   Report Status PENDING  Incomplete  Blood Culture (routine x 2)     Status: None (Preliminary result)   Collection Time: 02/26/17  1:58 PM  Result Value Ref Range Status   Specimen Description BLOOD BLOOD LEFT HAND  Final   Special Requests BOTTLES DRAWN AEROBIC ONLY 10 CC  Final   Culture PENDING  Incomplete   Report Status PENDING  Incomplete     Radiological Exams on Admission: Dg Chest 2 View  Result Date: 02/26/2017 CLINICAL DATA:  Altered mental status EXAM: CHEST  2 VIEW COMPARISON:  October 29, 2011 FINDINGS: There is no edema or consolidation. Heart size and pulmonary vascularity are normal. No adenopathy. There is degenerative change in the lower thoracic region. IMPRESSION: No edema or consolidation. Electronically Signed   By: Lowella Grip III M.D.   On: 02/26/2017 14:40    EKG: Independently reviewed.  Afib with rate 97; nonspecific ST changes with no evidence of acute ischemia  Assessment/Plan Principal Problem:   Acute encephalopathy Active Problems:   Chronic anticoagulation   History of pulmonary embolism./ DVT   Fracture, femur (HCC)   Type 1 diabetes mellitus with hyperglycemia (HCC)   Sepsis (HCC)   Pressure injury of skin   Atrial fibrillation (HCC)   Acute encephalopathy, likely related to sepsis from urinary  source -While this may be multifactorial including recent leg fracture with sedating medications and CVA, sepsis from a urinary source seems the current most likely etiology -Elevated WBC count (14.1), tachypnea with elevated lactate to 2.75 (repeat 1.3) and elevated procalcitonin (0.13) -While awaiting blood cultures, this appears to be a preseptic condition. -Sepsis protocol initiated but did not receive full bolus due to lactate <4 and not hypotensive -Suspect urinary source with recent foley and urinary retention -Blood and urine cultures pending -3/22 and 3/23 urine cultures with insignificant/no growth -ABG 7.426/49.7/61.4/30.8/90.7% -UA: few bacteria, 150 glucose, large Hgb, large LE, negative nitrite, TNTC RBC and WBC -Will admit and continue to monitor -Treat with IV Cefepime since he is coming from a SNF -Will trend lactate/procalcitonin to ensure improvement -He may have some underlying dementia, does appear to have sundowning behaviors; if agitated, his MD niece prefers use of Seroquel for him -If his mental status does not improve with antibiotics, would consider CT/MRI for CVA evaluation.  However, his symptoms have been progressive for days; his head CT was negative on 3/21; and he is already on blood thinners so it seems unlikely that we would do anything differently at this time.  Distal femur fracture -Patient was determined to not be a surgical candidate; currently, this is very likely to be the case -  Consult with orthopedics tomorrow to determine if there is anything else that can be done -He is in a full leg cast, which essentially makes him non-mobile -If he remains immobile, his mortality rate is exceedingly high - although that may be the case regardless of treatment plan -Will attempt to avoid sedating medications; Tylenol for pain now and if he appears to have uncontrolled pain then will treat with morphine (short-acting)  Afib -This appears to be new in onset -He  is rate controlled and anticoagulated (Xarelto for h/o DVT/PE) -As of now, would not suggest any additional treatment -Will repeat EKG in AM, as it is possible that today's abnormal EKG was related to artifact -INR 2.63 - despite taking Xarelto for anticoagulation -Troponin 0.01  Diabetes -Glucose 235 (440 yesterday) -He is on a minimal dose of insulin as an outpatient -Will cover with SSI without additional longer-acting medications at this time  Pressure ulcer -Wound care consult   DVT prophylaxis: Xarelto Code Status: DNR - confirmed with patient/family Family Communication: Son and daughter-in-law were present throughout evaluation and have been informed that they should be prepared for all potential outcomes; they report that they are told this every time the patient comes in the hospital and are aware that with his age and multiple medical conditions that he may not survive the hospitalization or long-term  Disposition Plan: Back to SNF once clinically improved Consults called: orthopedics, wound care Admission status: Admit - It is my clinical opinion that admission to [place] is reasonable and necessary because this patient will require at least 2 midnights in the hospital to treat this condition based on the medical complexity of the problems presented.  Given the aforementioned information, the predictability of an adverse outcome is felt to be significant.    Karmen Bongo MD [facility]  If 7PM-7AM, please contact night-coverage [email or web address] Password [facility]  02/26/2017, 8:07 PM

## 2017-02-26 NOTE — ED Provider Notes (Signed)
Emergency Department Provider Note   I have reviewed the triage vital signs and the nursing notes.  Level 5 caveat: AMS   HISTORY  Chief Complaint Altered Mental Status   HPI Eduardo Lee is a 83 y.o. male with PMH of DM, HTN, neuropathy, CVA, and history of DVT/PE presents to the emergency room in for evaluation of worsening confusion and weakness. The patient is currently staying at the [facility]. Unclear last known normal. In review of Epic the patient had acute mental status change (somnolence) this AM. In the past this was thought to be due to medication vs lack of sleep. He is at the [facility] for rehab after sustaining a femur fracture and confusion after being sent home.   Level 5 caveat: Patient with somnolence and mild confusion during conversation.    Past Medical History:  Diagnosis Date  . Diabetes mellitus   . DVT (deep venous thrombosis) ([place])   . Gastroparesis   . Hypertension   . Legally blind    diabetic retinopathy  . Neuropathy ([place])   . Osteoporosis   . Pulmonary embolism ([place]) 12/2010   on blood thinners  . Stroke ([place])   . Weight loss     Patient Active Problem List   Diagnosis Date Noted  . Sepsis ([place]) 02/26/2017  . Altered mental status 02/19/2017  . History of pulmonary embolism./ DVT 02/19/2017  . Fracture, femur ([place]) 02/19/2017  . Type 1 diabetes mellitus with hyperglycemia ([place])   . Hypercholesterolemia 01/29/2017  . Diabetic peripheral neuropathy ([place]) 12/11/2014  . Chronic upper back pain 12/11/2014  . Osteoporosis, unspecified 06/23/2014  . Chronic anticoagulation 05/26/2013  . Acute DVT (deep venous thrombosis) ([place]) 05/09/2013  . Left leg DVT ([place]) 05/07/2013  . Hypertension   . Intertrochanteric fracture of right femur ([place]) 12/09/2011  . ARF (acute renal failure) ([place]) 10/29/2011  . Leukocytosis 10/29/2011  . Normocytic anemia 10/29/2011  . Diabetic gastroparesis ([place]) 10/29/2011  . Weight loss, abnormal  10/29/2011    Past Surgical History:  Procedure Laterality Date  . ANKLE SURGERY     Patient states that he had pins place in the left foot  . BACK SURGERY  2008  . CARPAL TUNNEL RELEASE  08/2011   Left hand  . COLONOSCOPY  01/2011  . ORIF HIP FRACTURE  10/31/2011   Procedure: OPEN REDUCTION INTERNAL FIXATION HIP;  Surgeon: Arther Abbott, MD;  Location: [facility];  Service: Orthopedics;  Laterality: Right;  with Gamma Nail  . TRANSURETHRAL RESECTION OF PROSTATE  1984  . UPPER GASTROINTESTINAL ENDOSCOPY  01/2011      Allergies Patient has no known allergies.  Family History  Problem Relation Age of Onset  . Diabetes Mother   . Diabetes Father   . Diabetes Sister   . Diabetes Brother   . Diabetes Sister   . Diabetes Son   . Healthy Son   . Healthy Son     Social History Social History  Substance Use Topics  . Smoking status: Former Smoker    Years: 20.00    Types: Cigars    Quit date: 10/27/1981  . Smokeless tobacco: Never Used  . Alcohol use No     Comment: Drinks Scotch 3 times per week prior to [employer]- hasn't drank x 1 month    Review of Systems  Level 5 caveat: Confusion and Somnolence.   ____________________________________________   PHYSICAL EXAM:  VITAL SIGNS: ED Triage Vitals  Enc Vitals Group  BP 02/26/17 1251 104/62     Pulse Rate 02/26/17 1251 93     Resp 02/26/17 1251 (!) 27     Temp 02/26/17 1253 97.8 F (36.6 C)     Temp src --      SpO2 02/26/17 1251 95 %     Weight 02/26/17 1251 135 lb (61.2 kg)     Height 02/26/17 1251 5\' 8"  (1.727 m)     Pain Score 02/26/17 1251 0   Constitutional: Somnolent. Chronically ill-appearing.  Eyes: Conjunctivae are normal. PERRL.  Head: Atraumatic. Nose: No congestion/rhinnorhea. Mouth/Throat: Mucous membranes are dry.  Neck: No stridor. Cardiovascular: Normal rate, regular rhythm. Good peripheral circulation. Grossly normal heart sounds.   Respiratory: Increased respiratory effort.  No  retractions. Lungs CTAB. Gastrointestinal: Soft and nontender. No distention.  Musculoskeletal: No lower extremity tenderness nor edema. No gross deformities of extremities. Neurologic: No gross focal neurologic deficits are appreciated.  Skin:  Skin is warm, dry and intact. No rash noted. ____________________________________________   LABS (all labs ordered are listed, but only abnormal results are displayed)  Labs Reviewed  COMPREHENSIVE METABOLIC PANEL - Abnormal; Notable for the following:       Result Value   Chloride 98 (*)    CO2 33 (*)    Glucose, Bld 235 (*)    BUN 29 (*)    Total Protein 5.4 (*)    Albumin 2.7 (*)    ALT 13 (*)    Alkaline Phosphatase 139 (*)    GFR calc non Af Amer 57 (*)    All other components within normal limits  CBC WITH DIFFERENTIAL/PLATELET - Abnormal; Notable for the following:    WBC 14.1 (*)    RBC 3.45 (*)    Hemoglobin 10.7 (*)    HCT 32.1 (*)    Platelets 463 (*)    Neutro Abs 12.0 (*)    Monocytes Absolute 1.1 (*)    All other components within normal limits  BLOOD GAS, VENOUS - Abnormal; Notable for the following:    pO2, Ven 61.4 (*)    Bicarbonate 30.8 (*)    Acid-Base Excess 7.6 (*)    All other components within normal limits  URINALYSIS, ROUTINE W REFLEX MICROSCOPIC - Abnormal; Notable for the following:    APPearance CLOUDY (*)    Glucose, UA 150 (*)    Hgb urine dipstick LARGE (*)    Ketones, ur 5 (*)    Protein, ur 30 (*)    Leukocytes, UA LARGE (*)    Bacteria, UA FEW (*)    Squamous Epithelial / LPF 0-5 (*)    Non Squamous Epithelial 0-5 (*)    All other components within normal limits  CBG MONITORING, ED - Abnormal; Notable for the following:    Glucose-Capillary 229 (*)    All other components within normal limits  I-STAT CG4 LACTIC ACID, ED - Abnormal; Notable for the following:    Lactic Acid, Venous 2.75 (*)    All other components within normal limits  CULTURE, BLOOD (ROUTINE X 2)  CULTURE, BLOOD  (ROUTINE X 2)  LACTIC ACID, PLASMA  LACTIC ACID, PLASMA  PROCALCITONIN  PROTIME-INR  APTT  BASIC METABOLIC PANEL  CBC  I-STAT TROPOININ, ED   ____________________________________________  EKG   EKG Interpretation  Date/Time:  Wednesday February 26 2017 12:50:33 EDT Ventricular Rate:  97 PR Interval:    QRS Duration: 78 QT Interval:  346 QTC Calculation: 440 R Axis:   55 Text Interpretation:  Atrial fibrillation Abnormal R-wave progression, early transition Borderline T wave abnormalities No STEMI.  Confirmed by Chapin Arduini MD, Yuval Rubens [phone]) on 02/26/2017 12:59:15 PM       ____________________________________________  RADIOLOGY  Dg Chest 2 View  Result Date: 02/26/2017 CLINICAL DATA:  Altered mental status EXAM: CHEST  2 VIEW COMPARISON:  October 29, 2011 FINDINGS: There is no edema or consolidation. Heart size and pulmonary vascularity are normal. No adenopathy. There is degenerative change in the lower thoracic region. IMPRESSION: No edema or consolidation. Electronically Signed   By: Lowella Grip III M.D.   On: 02/26/2017 14:40    ____________________________________________   PROCEDURES  Procedure(s) performed:   Procedures  CRITICAL CARE Performed by: Margette Fast Total critical care time: 30 minutes Critical care time was exclusive of separately billable procedures and treating other patients. Critical care was necessary to treat or prevent imminent or life-threatening deterioration. Critical care was time spent personally by me on the following activities: development of treatment plan with patient and/or surrogate as well as nursing, discussions with consultants, evaluation of patient's response to treatment, examination of patient, obtaining history from patient or surrogate, ordering and performing treatments and interventions, ordering and review of laboratory studies, ordering and review of radiographic studies, pulse oximetry and re-evaluation of patient's  condition.  Nanda Quinton, MD Emergency Medicine  ____________________________________________   INITIAL IMPRESSION / ASSESSMENT AND PLAN / ED COURSE  Pertinent labs & imaging results that were available during my care of the patient were reviewed by me and considered in my medical decision making (see chart for details).  Patient resents to the emergency department for evaluation of somnolence and some mild confusion that began abruptly this morning. Patient is currently living at a skilled nursing facility after recent admission. Review of lab work from prior days shows a mild anion gap acidosis. Patient does appear dehydrated. Need to repeat lab work today to rule out DKA. No evidence of head trauma. No focal neurological deficits to suggest stroke. Patient also has an indwelling Foley catheter which represents an infection source. Plan to exchange the catheter and obtain a urine sample. Patient is on anticoagulation for DVT/PE in 2012. His EKG on arrival shows atrial fibrillation which appears to be new. Patient does have a DNR form at bedside and no family here to provide additional history.   Patient with elevated lactate (2.75). Starting Vanc and Zosyn with presumed sepsis and AMS. UA pending. Updated family at bedside.   Patient with UTI, presumed urosepsis. Plan for admission.   Discussed patient's case with hospitalist, Dr. Lorin Mercy. Patient and family (if present) updated with plan. Care transferred to hospitalist service.  I reviewed all nursing notes, vitals, pertinent old records, EKGs, labs, imaging (as available).  ____________________________________________  FINAL CLINICAL IMPRESSION(S) / ED DIAGNOSES  Final diagnoses:  Somnolence  Urinary tract infection associated with indwelling urethral catheter, initial encounter ([place])  Sepsis, due to unspecified organism [facility])     MEDICATIONS GIVEN DURING THIS VISIT:  Medications  0.9 %  sodium chloride infusion ( Intravenous  Transfusing/Transfer 02/26/17 1641)  citalopram (CELEXA) tablet 20 mg (not administered)  rivaroxaban (XARELTO) tablet 20 mg (20 mg Oral Given 02/26/17 1817)  pantoprazole (PROTONIX) EC tablet 80 mg (not administered)  aspirin chewable tablet 81 mg (not administered)  latanoprost (XALATAN) 0.005 % ophthalmic solution 1 drop (not administered)  acetaminophen (TYLENOL) tablet 650 mg (not administered)    Or  acetaminophen (TYLENOL) suppository 650 mg (not administered)  ondansetron (ZOFRAN) tablet 4  mg (not administered)    Or  ondansetron (ZOFRAN) injection 4 mg (not administered)  ceFEPIme (MAXIPIME) 2 g in dextrose 5 % 50 mL IVPB (2 g Intravenous Given 02/26/17 1823)  lactated ringers infusion ( Intravenous New Bag/Given 02/26/17 1817)  sodium chloride 0.9 % bolus 500 mL (0 mLs Intravenous Stopped 02/26/17 1401)  piperacillin-tazobactam (ZOSYN) IVPB 3.375 g (0 g Intravenous Stopped 02/26/17 1433)  vancomycin (VANCOCIN) 1,250 mg in sodium chloride 0.9 % 250 mL IVPB (0 mg Intravenous Stopped 02/26/17 1603)  sodium chloride 0.9 % bolus 500 mL (0 mLs Intravenous Stopped 02/26/17 1516)     NEW OUTPATIENT MEDICATIONS STARTED DURING THIS VISIT:  None   Note:  This document was prepared using Dragon voice recognition software and may include unintentional dictation errors.  Nanda Quinton, MD Emergency Medicine  Margette Fast, MD 02/26/17 [id number]

## 2017-02-26 NOTE — ED Notes (Signed)
Pt responds to voice. Bloody drainage noted to penis, foley, and adult diaper. Amber cloudy urine noted in drainage bag. MM dry, pt with mouth open, breathing through mouth.

## 2017-02-26 NOTE — Progress Notes (Signed)
Location:   [facility] Room Number: 139/D Place of Service:  SNF [phone]) Provider:  Geronimo Running, MD  Patient Care Team: Mikey Kirschner, MD as PCP - General  Extended Emergency Contact Information Primary Emergency Contact: Ransier,Mike Address: [place], [place] Phone: [phone] Mobile Phone: [phone] Relation: Son Secondary Emergency Contact: Eduard Clos [place] Phone: [phone] Relation: Son  Code Status:  DNR Goals of care: Advanced Directive information Advanced Directives 02/26/2017  Does Patient Have a Medical Advance Directive? Yes  Type of Advance Directive Out of facility DNR (pink MOST or yellow form)  Does patient want to make changes to medical advance directive? No - Patient declined  Copy of [place] in Chart? -  Pre-existing out of facility DNR order (yellow form or pink MOST form) -     Chief Complaint  Patient presents with  . Acute Visit  Secondary to altered mental status  HPI:  Pt is a 83 y.o. male seen today for an acute visit for altered mental status.  According to therapy and his sitter patient was alert Bright interactive earlier today visiting with friends.  However shortly before I was called to the room he had an acute change according to staff were he became somnolent lying back in bed.  He does have a history of elevated blood sugars but  sugar was actually in the 200s  Temperature was difficult to obtain however blood pressure. was stable at 109/64 pulse was 86 O2 saturation was 98% on room air  He does have some history of altered mental status after sustaining a distal left femur fracture in [place] currently in a cast and he is awaiting orthopedic consult on this.  His altered mental status previously was thought possibly to be due to lack asleep and this apparently improved shortly after  he arrived at the facility.  He did receive a Percocet for pain earlier this morning but according the therapy was bright and alert after receiving this.  Currently he is denying any pain chest pain or shortness of breath or abdominal discomfort.  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 And appears mildly uncomfortable  He does not appear to be in  acute distress but according to his sitter in the room this is an acute change of fairly sudden onset.  I did speak with nursing as stated that last evening he did have some anxiety and agitation-unclear exactly how well he slept last night.     Past Medical History:  Diagnosis Date  . Diabetes mellitus   . DVT (deep venous thrombosis) ([place])   . Gastroparesis   . Hypertension   . Neuropathy ([place])   . Osteoporosis   . Pulmonary embolism ([place]) 12/2010  . Stroke ([place])   . Weight loss    Past Surgical History:  Procedure Laterality Date  . ANKLE SURGERY     Patient states that he had pins place in the left foot  . BACK SURGERY  2008  . CARPAL TUNNEL RELEASE  08/2011   Left hand  . COLONOSCOPY  01/2011  . ORIF HIP FRACTURE  10/31/2011   Procedure: OPEN REDUCTION INTERNAL FIXATION HIP;  Surgeon: Arther Abbott,  MD;  Location: [facility];  Service: Orthopedics;  Laterality: Right;  with Gamma Nail  . TRANSURETHRAL RESECTION OF PROSTATE  1984  . UPPER GASTROINTESTINAL ENDOSCOPY  01/2011    No Known Allergies  Current Outpatient Prescriptions on File Prior to Visit  Medication Sig Dispense Refill  . aspirin 81 MG tablet Take 81 mg by mouth every morning.     Roseanne Kaufman Peru-Castor Oil (VENELEX) OINT Apply to right buttocks and sacrum for prevention every shift    . citalopram (CELEXA) 20 MG tablet Take 1 tablet (20 mg total) by mouth every morning. 90 tablet 1  . collagenase  (SANTYL) ointment Apply to coccyx wound per tx orders    . esomeprazole (NEXIUM) 40 MG capsule Take 40 mg by mouth daily as needed. Reported on 05/10/2016    . ezetimibe (ZETIA) 10 MG tablet Take 1 tablet (10 mg total) by mouth at bedtime. 90 tablet 1  . insulin aspart (NOVOLOG FLEXPEN) 100 UNIT/ML FlexPen Give per sliding scale    . insulin glargine (LANTUS) 100 UNIT/ML injection Inject 0.07 mLs (7 Units total) into the skin at bedtime. 10 mL 11  . latanoprost (XALATAN) 0.005 % ophthalmic solution Place 1 drop into both eyes at bedtime.     . Multiple Vitamin (MULTIVITAMIN WITH MINERALS) TABS tablet Take 1 tablet by mouth daily.    Marland Kitchen oxyCODONE-acetaminophen (PERCOCET/ROXICET) 5-325 MG tablet Take 1 tablet by mouth every 8 (eight) hours as needed for severe pain. 90 tablet 0  . polyethylene glycol powder (GLYCOLAX/MIRALAX) powder Take 34 g by mouth every morning.    . potassium chloride SA (K-DUR,KLOR-CON) 20 MEQ tablet Take 20 mEq by mouth daily.    . rivaroxaban (XARELTO) 20 MG TABS tablet Take 1 tablet (20 mg total) by mouth daily with supper. 30 tablet 11  . vitamin B-12 (CYANOCOBALAMIN) 1000 MCG tablet Take 1,000 mcg by mouth daily.      . Vitamin D, Ergocalciferol, (DRISDOL) 50000 UNITS CAPS Take 50,000 Units by mouth every 7 (seven) days.      No current facility-administered medications on file prior to visit.      Review of Systems   Again this is limited second of patient's somnolence but when asked directly he denies chest pain abdominal pain or shortness of breath denies pain in general  Immunization History  Administered Date(s) Administered  . Influenza Split 08/31/2013  . Influenza,inj,Quad PF,36+ Mos 09/07/2014, 09/12/2015  . Influenza-Unspecified 09/02/2011, 10/02/2016  . Pneumococcal Conjugate-13 06/07/2014  . Pneumococcal Polysaccharide-23 09/01/2001  . Td 06/01/2009  . Tdap 04/26/2016  . Zoster 05/23/2009   Pertinent  Health Maintenance Due  Topic Date Due  .  URINE MICROALBUMIN  02/20/1936  . HEMOGLOBIN A1C  06/10/2016  . OPHTHALMOLOGY EXAM  06/12/2016  . FOOT EXAM  04/08/2017  . INFLUENZA VACCINE  Completed  . PNA vac Low Risk Adult  Completed   Fall Risk  06/10/2016  Falls in the past year? Yes  Number falls in past yr: 1  Injury with Fall? Yes  Risk Factor Category  High Fall Risk  Risk for fall due to : Impaired vision  Follow up Falls evaluation completed;Education provided;Falls prevention discussed   Functional Status Survey:   Temperature is pending pulse 86 respirations 19 blood pressure 109/64 O2 saturation is in the 90s on room air   Physical Exam   In general this is a frail appearing elderly male appears somnolent but will answer questions at times-he does not appear to be  in acute distress but appears somewhat uncomfortable  His skin is warm and dry.  Eyes pupils appear somewhat sluggish sclera and conjunctiva  for clear he does have some history of blindness with limited visual acuity.--  Oropharynx is clear mucous membranes appear slightly dry.  Chest is clear to auscultation there is no labored breathing. There is poor respiratory effort however  Heart is regular with frequent irregular beats-he does not have significant lower extremity edema  Abdomen is soft with positive bowel sounds could not really appreciate tenderness.  GU could not appreciate any suprapubic distention does have a Foley catheter in place draining  of amber colored urine  Muscle skeletal does have his left leg in a hard cast otherwise appears able to move his other extremities at baseline-- grip strength appears to be quite strong bilaterally-- he is able to lift his arms  Neurologic -again is able to move all his extremities except his left leg which is in a cast-moves the other extremities appears at baseline he has good grip strength bilaterally is able to open his eyes does have again have limited visual acuity speech appears to be  clear although he remains quite somnolent  Psych Again he is able to tell me 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  Sodium 136 potassium 4.8 BUN 32 creatinine 1.30 CO2 is 22.  WBC 8.4 hemoglobin 10.2 platelets 402.        Recent Labs  02/21/17 1411 02/22/17 0730 02/25/17 0700  NA 142 137 136  K 3.3* 4.6 4.8  CL 106 101 96*  CO2 28 17* 22  GLUCOSE 228* 470* 440*  BUN 26* 32* 32*  CREATININE 1.13 1.50* 1.30*  CALCIUM 9.2 8.8* 8.9    Recent Labs  05/07/16 0949 02/19/17 1541  AST 17 22  ALT 12 16*  ALKPHOS 171* 93  BILITOT 0.4 1.2  PROT 6.5 6.3*  ALBUMIN 3.8 3.3*    Recent Labs  02/21/17 1411 02/22/17 0730 02/25/17 0700  WBC 8.1 9.9 8.4  NEUTROABS 6.7 8.4*  --   HGB 9.2* 10.0* 10.2*  HCT 27.3* 30.4* 30.8*  MCV 91.6 94.7 93.6  PLT 317 321 402*   No results found for: TSH Lab Results  Component Value Date   HGBA1C 8.5 09/27/2013   Lab Results  Component Value Date   CHOL 208 (H) 05/07/2016   HDL 72 05/07/2016   LDLCALC 119 (H) 05/07/2016   TRIG 86 05/07/2016   CHOLHDL 2.9 05/07/2016    Significant Diagnostic Results in last 30 days:  Ct Head Wo Contrast  Result Date: 02/19/2017 CLINICAL DATA:  Altered mental status with history of fall EXAM: CT HEAD WITHOUT CONTRAST TECHNIQUE: Contiguous axial images were obtained from the base of the skull through the vertex without intravenous contrast. COMPARISON:  10/29/2011 FINDINGS: Brain: No acute territorial infarction, hemorrhage or mass is visualized. Moderate diffuse atrophy. Mild to moderate periventricular and subcortical white matter small vessel ischemic changes. Old lacunar infarcts within the thalamus and right basal ganglia. Enlarged ventricles are stable in size and felt secondary to atrophy. Vascular: No hyperdense  vessels. Carotid artery calcifications and vertebral artery calcifications. Skull: No fracture.  No suspicious bone lesion. Sinuses/Orbits: Mucosal thickening in the ethmoid and maxillary sinuses. Bony remottling of the left maxillary sinus wall consistent with chronic sinusitis. No acute orbital  abnormality. Other: None IMPRESSION: 1. No CT evidence for acute intracranial abnormality 2. Atrophy with mild to moderate white matter small vessel ischemic changes. Electronically Signed   By: Donavan Foil M.D.   On: 02/19/2017 16:57    Assessment/Plan #1 altered mental status apparently a fairly acute onset-vital signs appear to be stable blood sugar is relatively stable considering his history of hyperglycemia.  He does respond at times to questions but appears to be significantly somnolent-I did discuss this with his daughter-in-law via phone-and will send him to the ER for expedient evaluation.  Rule out neurologic event possibly infection-cannot really rule out that this may be medication related or patient is extremely tired from being up at  night although again the history is somewhat unclear here.   [id number]

## 2017-02-26 NOTE — ED Triage Notes (Signed)
Pt here from the Eduardo Lee for altered mental status.  Unsure when it occurred.

## 2017-02-26 NOTE — Progress Notes (Addendum)
Pharmacy Antibiotic Note  Eduardo Lee is a 83 y.o. male admitted on 02/26/2017 with sepsis.  Pharmacy has been consulted for Cefepime  Plan: Cefepime 2gm IV every 24 hours. F/U cxs, labs, and clinical progress Deescalate therapy as indicated  Height: 5\' 8"  (172.7 cm) Weight: 138 lb (62.6 kg) IBW/kg (Calculated) : 68.4  Temp (24hrs), Avg:97.9 F (36.6 C), Min:97.8 F (36.6 C), Max:97.9 F (36.6 C)   Recent Labs Lab 02/20/17 0557 02/21/17 1411 02/22/17 0730 02/25/17 0700 02/26/17 1252 02/26/17 1318  WBC 6.5 8.1 9.9 8.4 14.1*  --   CREATININE 1.12 1.13 1.50* 1.30* 1.10  --   LATICACIDVEN  --   --   --   --   --  2.75*    Estimated Creatinine Clearance: 40.3 mL/min (by C-G formula based on SCr of 1.1 mg/dL).    No Known Allergies  Antimicrobials this admission: Vancomycin 3/28 >>3/28 Zosyn 3/28 >>3/28 Cefepime 3/28 >>   Dose adjustments this admission: N/A     Microbiology results: 3/28 BCx: pending 3/28 UCx: pending  Thank you for allowing pharmacy to be a part of this patient's care.  Pricilla Larsson, [facility]  02/26/2017 5:06 PM

## 2017-02-27 ENCOUNTER — Inpatient Hospital Stay (HOSPITAL_COMMUNITY): Payer: Medicare Other

## 2017-02-27 ENCOUNTER — Ambulatory Visit (INDEPENDENT_AMBULATORY_CARE_PROVIDER_SITE_OTHER): Payer: Medicare Other | Admitting: Orthopaedic Surgery

## 2017-02-27 DIAGNOSIS — G9341 Metabolic encephalopathy: Secondary | ICD-10-CM

## 2017-02-27 DIAGNOSIS — Z7901 Long term (current) use of anticoagulants: Secondary | ICD-10-CM

## 2017-02-27 DIAGNOSIS — A419 Sepsis, unspecified organism: Secondary | ICD-10-CM

## 2017-02-27 DIAGNOSIS — E1165 Type 2 diabetes mellitus with hyperglycemia: Secondary | ICD-10-CM

## 2017-02-27 DIAGNOSIS — E1143 Type 2 diabetes mellitus with diabetic autonomic (poly)neuropathy: Secondary | ICD-10-CM

## 2017-02-27 DIAGNOSIS — T83511A Infection and inflammatory reaction due to indwelling urethral catheter, initial encounter: Principal | ICD-10-CM

## 2017-02-27 DIAGNOSIS — E1122 Type 2 diabetes mellitus with diabetic chronic kidney disease: Secondary | ICD-10-CM

## 2017-02-27 DIAGNOSIS — N390 Urinary tract infection, site not specified: Secondary | ICD-10-CM

## 2017-02-27 DIAGNOSIS — Z86711 Personal history of pulmonary embolism: Secondary | ICD-10-CM

## 2017-02-27 DIAGNOSIS — N183 Chronic kidney disease, stage 3 unspecified: Secondary | ICD-10-CM

## 2017-02-27 LAB — BASIC METABOLIC PANEL
Anion gap: 15 (ref 5–15)
BUN: 24 mg/dL — AB (ref 6–20)
CALCIUM: 8.3 mg/dL — AB (ref 8.9–10.3)
CO2: 25 mmol/L (ref 22–32)
CREATININE: 1.14 mg/dL (ref 0.61–1.24)
Chloride: 97 mmol/L — ABNORMAL LOW (ref 101–111)
GFR calc Af Amer: >60 mL/min (ref >60)
GFR, EST NON AFRICAN AMERICAN: 55 mL/min — AB (ref >60)
GLUCOSE: 334 mg/dL — AB (ref 65–99)
Potassium: 4.6 mmol/L (ref 3.5–5.1)
Sodium: 137 mmol/L (ref 135–145)

## 2017-02-27 LAB — GLUCOSE, CAPILLARY
GLUCOSE-CAPILLARY: 338 mg/dL — AB (ref 65–99)
GLUCOSE-CAPILLARY: 355 mg/dL — AB (ref 65–99)
GLUCOSE-CAPILLARY: 322 mg/dL — AB (ref 65–99)
GLUCOSE-CAPILLARY: 220 mg/dL — AB (ref 65–99)
Glucose-Capillary: 213 mg/dL — ABNORMAL HIGH (ref 65–99)

## 2017-02-27 LAB — CBC
HCT: 27.9 % — ABNORMAL LOW (ref 39.0–52.0)
Hemoglobin: 9.2 g/dL — ABNORMAL LOW (ref 13.0–17.0)
MCH: 31 pg (ref 26.0–34.0)
MCHC: 33 g/dL (ref 30.0–36.0)
MCV: 93.9 fL (ref 78.0–100.0)
Platelets: 397 10*3/uL (ref 150–400)
RBC: 2.97 MIL/uL — ABNORMAL LOW (ref 4.22–5.81)
RDW: 15.5 % (ref 11.5–15.5)
WBC: 11.4 10*3/uL — ABNORMAL HIGH (ref 4.0–10.5)

## 2017-02-27 LAB — MRSA PCR SCREENING: MRSA BY PCR: NEGATIVE

## 2017-02-27 LAB — MAGNESIUM: MAGNESIUM: 2.1 mg/dL (ref 1.7–2.4)

## 2017-02-27 MED ORDER — SODIUM CHLORIDE 0.9 % IV SOLN
INTRAVENOUS | Status: AC
  Administered 2017-02-27 (×2): via INTRAVENOUS

## 2017-02-27 MED ORDER — SODIUM CHLORIDE 0.9 % IV SOLN
INTRAVENOUS | Status: DC
  Administered 2017-02-27: 10:00:00 via INTRAVENOUS
  Filled 2017-02-27 (×3): qty 1000

## 2017-02-27 MED ORDER — INSULIN ASPART 100 UNIT/ML ~~LOC~~ SOLN
0.0000 [IU] | SUBCUTANEOUS | Status: DC
  Administered 2017-02-27: 9 [IU] via SUBCUTANEOUS
  Administered 2017-02-27 (×2): 3 [IU] via SUBCUTANEOUS
  Administered 2017-02-27: 7 [IU] via SUBCUTANEOUS
  Administered 2017-02-28: 1 [IU] via SUBCUTANEOUS
  Administered 2017-02-28: 2 [IU] via SUBCUTANEOUS
  Administered 2017-02-28: 1 [IU] via SUBCUTANEOUS

## 2017-02-27 MED ORDER — INSULIN GLARGINE 100 UNIT/ML ~~LOC~~ SOLN
6.0000 [IU] | Freq: Every day | SUBCUTANEOUS | Status: DC
  Administered 2017-02-27 – 2017-03-01 (×3): 6 [IU] via SUBCUTANEOUS
  Filled 2017-02-27 (×5): qty 0.06

## 2017-02-27 MED ORDER — COLLAGENASE 250 UNIT/GM EX OINT
TOPICAL_OINTMENT | Freq: Every day | CUTANEOUS | Status: DC
  Administered 2017-02-27 – 2017-03-01 (×3): via TOPICAL
  Filled 2017-02-27: qty 30

## 2017-02-27 NOTE — Care Management Note (Signed)
Case Management Note  Patient Details  Name: Eduardo Lee MRN: [id number] Date of Birth: 17-Nov-1926  Subjective/Objective:                  Pt admitted with AMS/sepsis. Pt from [facility] where he was recently placed as PP. CSW following.   Action/Plan: Anticipate return to facility. CSW following. No CM needs anticipated.   Expected Discharge Date:  03/01/17               Expected Discharge Plan:  [place]  In-House Referral:  Clinical Social Work  Discharge planning Services  CM Consult  Post Acute Care Choice:  NA Choice offered to:  NA  Status of Service:  Completed, signed off  Sherald Barge, RN 02/27/2017, 10:14 AM

## 2017-02-27 NOTE — Progress Notes (Signed)
Inpatient Diabetes Program Recommendations  AACE/ADA: New Consensus Statement on Inpatient Glycemic Control (2015)  Target Ranges:  Prepandial:   less than 140 mg/dL      Peak postprandial:   less than 180 mg/dL (1-2 hours)      Critically ill patients:  140 - 180 mg/dL  Results for Eduardo Lee, Eduardo Lee (MRN [id number]) as of 02/27/2017 07:42  Ref. Range 02/26/2017 12:52 02/27/2017 04:36  Glucose Latest Ref Range: 65 - 99 mg/dL 235 (H) 334 (H)   Results for Eduardo Lee, Eduardo Lee (MRN [id number]) as of 02/27/2017 07:42  Ref. Range 02/26/2017 12:50 02/26/2017 21:30  Glucose-Capillary Latest Ref Range: 65 - 99 mg/dL 229 (H) 338 (H)   Review of Glycemic Control  Diabetes history: DM2  Outpatient Diabetes medications: Tresiba 5 units QHS, Humalog 3 units TID with meals Current orders for Inpatient glycemic control: Novolog 0-9 units TID with meals, Novolog 0-5 units QHS  Inpatient Diabetes Program Recommendations: Insulin - Basal: Fasting glucose is 334 mg/dl this morning. Please consider ordering low dose basal insulin; recommend ordering Lantus 6 units Q24H starting now. Correction (SSI): Please change frequency of CBGs and Novolog 0-9 units to Q4H since patient is NPO.  Thanks, Barnie Alderman, RN, MSN, CDE Diabetes Coordinator Inpatient Diabetes Program [phone] (Team Pager from 8am to 5pm)

## 2017-02-27 NOTE — Consult Note (Signed)
[place] Nurse wound consult note Reason for Consult: Unstageable pressure injury to coccyx, fecal incontinence. Positioning is complicated by thigh-high cast.  Wound healing will be complicated by uncontrolled diabetes. Wound type:Pressure Pressure Injury POA: Yes Measurement: Area f involvement measures 6cm x 5cm with a 2cm x 1.5cm Unstageable pressure injury over the coccyx whose depth is obscured by the presence of white slough (non-viable tissue).  Adjacent to the Unstageable pressure injury is a 3cm x 3cm x 0.2cm  full thickness wound (Stage 3) with a red, moist wound bed. Wound bed:As described above Drainage (amount, consistency, odor) Small amount of serous to light yellow exudate-consistent with autolytically debriding slough Periwound: Mild erythema measuring 1cm circumferentially around wound, no induration or warmth. Dressing procedure/placement/frequency: Patient with a thigh-high cast for a left LE fracture that has limited mobility according to family (paid) caregiver.  The caregiver assists with meals and communication, but does not perform incontinence care or turn and reposition the patient-she has never seen the patient's buttocks, reporting that "only his sons" have..  She is not surprised by the presence of a pressure injury as she notes, "he is on his back all the time because of the cast".  We will today implement topical wound care aimed to enzymatically debride the non-viable tissue using collagenase so that the true depth of the pressure injury is revealed, also care will center on turning and repositioning, the timely provision of incontinence care using our house products and the provision of a pressure redistribution mattress with low air loss feature.  We will place the feet into pressure redistribution heel boots to prevent pressure injury and provide a pressure redistribution chair cushion for when patient is able to get OOB to the chair. [place] nursing team will not follow, but  will remain available to this patient, the nursing and medical teams.  Please re-consult if needed, specifically if the wound does not improve in concert with the patient's overall improvement in status. Thanks, Maudie Flakes, MSN, RN, [place], Arther Abbott  Pager# [phone]

## 2017-02-27 NOTE — Progress Notes (Signed)
PROGRESS NOTE  Eduardo Lee HYW:[id number] DOB: 03/25/1926 DOA: 02/26/2017 PCP: Mickie Hillier, MD  Brief History:  83 year old male with a history of diabetes mellitus, hypertension, stroke, DVT/PE presenting from the [facility] with increasing confusion. Apparently, the patient had a mechanical fall at her restaurant in [place] resulting in a left distal femur fracture. He was seen at a hospital in [place], but he was not deemed to be a good surgical candidate by orthopedics. A long-leg cast was placed in [place] on 02/17/2017. The patient's family brought the patient back to [place], but noted that he had increasing confusion. He was admitted for 02/19/2017 through 02/20/2017 after which he was discharged to the [facility]. Notably, the patient had a recent Foley catheter placed on 02/21/2017 secondary to urinary retention. He failed a voiding trial on 02/25/2017 and Foley catheter had to be placed back. In the past 24-48 hours, the patient's family has noted increasing confusion. As a result, the patient was brought to the hospital for further evaluation  Assessment/Plan: Acute metabolic encephalopathy -Multifactorial including UTI, dehydration, and sedative medications -Continue cefepime pending culture data -Mental status is slightly better on the morning of 02/27/2017  Sepsis -Patient presented with leukocytosis and elevated lactic acid -Secondary to urinary source -UA with TNTC WBC -Lactic acid peaked at 2.75 -Elevated coags likely secondary to rivaroxaban  UTI -continue cefepime pending culture data  Left distal femur fracture -Consulted orthopedics for opinion -Left leg cast was placed on 02/17/2017 in [place]  Dehydration -Secondary to cellular diuresis from poorly controlled diabetes mellitus -The patient may have had hyperosmolar state on 02/25/2017 with a serum glucose up to 440  Diabetes mellitus type 2, uncontrolled with  gastroparesis -Hemoglobin A1c -Start Lantus 6 units daily -NovoLog sliding scale  CKD stage 2-3 -Baseline creatinine 1.1-1.3  DVT/PE history -Continue rivaroxaban  Unstageable sacral pressure injury -Present at the time of admission -Appreciate wound care consult   Disposition Plan:   SNF in 2-3 days  Family Communication:   Family at bedside  Consultants:    Code Status:  FULL / DNR  DVT Prophylaxis:  [place] Heparin / [place] Lovenox   Procedures: As Listed in Progress Note Above  Antibiotics: None    Subjective: Patient is pleasantly confused but able to answer simple questions. Denies any headache, chest pain, short of breath, denies any vomiting. Remainder review of systems unobtainable secondary to patient's encephalopathy  Objective: Vitals:   02/26/17 1600 02/26/17 1658 02/26/17 2300 02/27/17 0457  BP: 139/61 (!) 132/50 (!) 120/50 (!) 119/39  Pulse:  88 97 93  Resp: (!) 24  20 20   Temp:  97.9 F (36.6 C) 98.1 F (36.7 C) 98.4 F (36.9 C)  TempSrc:  Axillary Oral Oral  SpO2:  100% 98% 96%  Weight:  62.6 kg (138 lb)    Height:  5\' 8"  (1.727 m)      Intake/Output Summary (Last 24 hours) at 02/27/17 0807 Last data filed at 02/27/17 0458  Gross per 24 hour  Intake          1600.83 ml  Output              650 ml  Net           950.83 ml   Weight change:  Exam:   General:  Pt is alert, follows commands appropriately, not in acute distress  HEENT: No icterus, No thrush, No neck  mass, [place]/AT  Cardiovascular: RRR, S1/S2, no rubs, no gallops  Respiratory: CTA bilaterally, no wheezing, no crackles, no rhonchi  Abdomen: Soft/+BS, non tender, non distended, no guarding  Extremities: No edema, No lymphangitis, No petechiae, No rashes, no synovitis; long left leg cast in place   Data Reviewed: I have personally reviewed following labs and imaging studies Basic Metabolic Panel:  Recent Labs Lab 02/21/17 1411 02/22/17 0730 02/25/17 0700 02/26/17 1252  02/27/17 0436  NA 142 137 136 138 137  K 3.3* 4.6 4.8 4.1 4.6  CL 106 101 96* 98* 97*  CO2 28 17* 22 33* 25  GLUCOSE 228* 470* 440* 235* 334*  BUN 26* 32* 32* 29* 24*  CREATININE 1.13 1.50* 1.30* 1.10 1.14  CALCIUM 9.2 8.8* 8.9 8.9 8.3*   Liver Function Tests:  Recent Labs Lab 02/26/17 1252  AST 20  ALT 13*  ALKPHOS 139*  BILITOT 0.8  PROT 5.4*  ALBUMIN 2.7*   No results for input(s): LIPASE, AMYLASE in the last 168 hours. No results for input(s): AMMONIA in the last 168 hours. Coagulation Profile:  Recent Labs Lab 02/26/17 1825  INR 2.63   CBC:  Recent Labs Lab 02/21/17 1411 02/22/17 0730 02/25/17 0700 02/26/17 1252 02/27/17 0436  WBC 8.1 9.9 8.4 14.1* 11.4*  NEUTROABS 6.7 8.4*  --  12.0*  --   HGB 9.2* 10.0* 10.2* 10.7* 9.2*  HCT 27.3* 30.4* 30.8* 32.1* 27.9*  MCV 91.6 94.7 93.6 93.0 93.9  PLT 317 321 402* 463* 397   Cardiac Enzymes: No results for input(s): CKTOTAL, CKMB, CKMBINDEX, TROPONINI in the last 168 hours. BNP: Invalid input(s): POCBNP CBG:  Recent Labs Lab 02/20/17 1636 02/26/17 1250 02/26/17 2130 02/27/17 0751  GLUCAP 325* 229* 338* 355*   HbA1C: No results for input(s): HGBA1C in the last 72 hours. Urine analysis:    Component Value Date/Time   COLORURINE YELLOW 02/26/2017 1305   APPEARANCEUR CLOUDY (A) 02/26/2017 1305   LABSPEC 1.024 02/26/2017 1305   PHURINE 5.0 02/26/2017 1305   GLUCOSEU 150 (A) 02/26/2017 1305   HGBUR LARGE (A) 02/26/2017 1305   BILIRUBINUR NEGATIVE 02/26/2017 1305   KETONESUR 5 (A) 02/26/2017 1305   PROTEINUR 30 (A) 02/26/2017 1305   UROBILINOGEN 0.2 10/29/2011 0555   NITRITE NEGATIVE 02/26/2017 1305   LEUKOCYTESUR LARGE (A) 02/26/2017 1305   Sepsis Labs: @LABRCNTIP (procalcitonin:4,lacticidven:4) ) Recent Results (from the past 240 hour(s))  Culture, Urine     Status: Abnormal   Collection Time: 02/20/17 11:50 AM  Result Value Ref Range Status   Specimen Description URINE, CLEAN CATCH  Final    Special Requests NONE  Final   Culture (A)  Final    <10,000 COLONIES/mL INSIGNIFICANT GROWTH Performed at [facility], [place] [address] [place], [place] [address]    Report Status 02/22/2017 FINAL  Final  Culture, Urine     Status: None   Collection Time: 02/21/17  3:00 AM  Result Value Ref Range Status   Specimen Description URINE, RANDOM  Final   Special Requests NONE  Final   Culture   Final    NO GROWTH Performed at [facility], [place] [address] [place], [place] [address]    Report Status 02/22/2017 FINAL  Final  Blood Culture (routine x 2)     Status: None (Preliminary result)   Collection Time: 02/26/17  1:52 PM  Result Value Ref Range Status   Specimen Description BLOOD LEFT ANTECUBITAL  Final   Special Requests BOTTLES DRAWN  AEROBIC AND ANAEROBIC 10 CC EACH  Final   Culture PENDING  Incomplete   Report Status PENDING  Incomplete  Blood Culture (routine x 2)     Status: None (Preliminary result)   Collection Time: 02/26/17  1:58 PM  Result Value Ref Range Status   Specimen Description BLOOD BLOOD LEFT HAND  Final   Special Requests BOTTLES DRAWN AEROBIC ONLY 10 CC  Final   Culture PENDING  Incomplete   Report Status PENDING  Incomplete  MRSA PCR Screening     Status: None   Collection Time: 02/26/17  7:32 PM  Result Value Ref Range Status   MRSA by PCR NEGATIVE NEGATIVE Final    Comment:        The GeneXpert MRSA Assay (FDA approved for NASAL specimens only), is one component of a comprehensive MRSA colonization surveillance program. It is not intended to diagnose MRSA infection nor to guide or monitor treatment for MRSA infections.      Scheduled Meds: . aspirin  81 mg Oral q morning - 10a  . ceFEPime (MAXIPIME) IV  2 g Intravenous Q24H  . chlorhexidine  15 mL Mouth/Throat QID  . citalopram  20 mg Oral q morning - 10a  . collagenase   Topical Daily  . insulin aspart  0-5 Units Subcutaneous QHS  . insulin aspart  0-9 Units Subcutaneous TID WC    . latanoprost  1 drop Both Eyes QHS  . pantoprazole  80 mg Oral Daily  . rivaroxaban  20 mg Oral Q supper   Continuous Infusions: . lactated ringers 75 mL/hr at 02/26/17 1817    Procedures/Studies: Dg Chest 2 View  Result Date: 02/26/2017 CLINICAL DATA:  Altered mental status EXAM: CHEST  2 VIEW COMPARISON:  October 29, 2011 FINDINGS: There is no edema or consolidation. Heart size and pulmonary vascularity are normal. No adenopathy. There is degenerative change in the lower thoracic region. IMPRESSION: No edema or consolidation. Electronically Signed   By: Lowella Grip III M.D.   On: 02/26/2017 14:40   Ct Head Wo Contrast  Result Date: 02/19/2017 CLINICAL DATA:  Altered mental status with history of fall EXAM: CT HEAD WITHOUT CONTRAST TECHNIQUE: Contiguous axial images were obtained from the base of the skull through the vertex without intravenous contrast. COMPARISON:  10/29/2011 FINDINGS: Brain: No acute territorial infarction, hemorrhage or mass is visualized. Moderate diffuse atrophy. Mild to moderate periventricular and subcortical white matter small vessel ischemic changes. Old lacunar infarcts within the thalamus and right basal ganglia. Enlarged ventricles are stable in size and felt secondary to atrophy. Vascular: No hyperdense vessels. Carotid artery calcifications and vertebral artery calcifications. Skull: No fracture.  No suspicious bone lesion. Sinuses/Orbits: Mucosal thickening in the ethmoid and maxillary sinuses. Bony remottling of the left maxillary sinus wall consistent with chronic sinusitis. No acute orbital abnormality. Other: None IMPRESSION: 1. No CT evidence for acute intracranial abnormality 2. Atrophy with mild to moderate white matter small vessel ischemic changes. Electronically Signed   By: Donavan Foil M.D.   On: 02/19/2017 16:57    Tani Virgo, DO  [facility] Pager [phone]  If 7PM-7AM, please contact night-coverage [email or web address] Password  [email or web address] 02/27/2017, 8:07 AM   LOS: 1 day

## 2017-02-27 NOTE — Progress Notes (Signed)
Please see below most recent assessment completed 1 week ago. Patient is from [facility], paying privately due to not meeting Medicare criteria of 3 midnight stay.  Plan at this time is for patient to return to [facility] [place].  Patient currently within inpatient stay and may qualify for Medicare to cover inpatient stay at [facility]. Will need PT order.  Will continue to follow and assist with disposition.    Lane Hacker, MSW Clinical Social Work: System Wide Float Coverage for :   [phone]   Clinical Social Work Assessment  Patient Details  Name: Eduardo Lee MRN: [id number] Date of Birth: 1925-12-24  Date of referral:  02/20/17               Reason for consult:  Discharge Planning                           Permission sought to share information with:    Permission granted to share information::                Name::                   Agency::                Relationship::                Contact Information:     Housing/Transportation Living arrangements for the past 2 months:  Single Family Home Source of Information:  Adult Children Patient Interpreter Needed:  None Criminal Activity/Legal Involvement Pertinent to Current Situation/Hospitalization:  No - Comment as needed Significant Relationships:  Adult Children Lives with:  Self Do you feel safe going back to the place where you live?  No Need for family participation in patient care:  Yes (Comment)  Care giving concerns:  Family unable to provide around the clock care for pt.    Social Worker assessment / plan:  CSW met with pt's son, Richardson Landry at bedside. Pt resting during visit and did not contribute to assessment. Richardson Landry states that pt was recently living with his girlfriend who was also helping with his care. He broke his femur on Saturday and since Monday has been very confused. Family unable to care for pt at this time at home. Discussed placement and ALF vs SNF. Son agrees SNF is most appropriate and is  aware it would be private pay due to observation status. Pt has been to [facility] in the past and this is preference. CSW will send referral.   Employment status:  Retired Forensic scientist:  Medicare PT Recommendations:  Not assessed at this time Information / Referral to community resources:  [place]  Patient/Family's Response to care:  Pt's son acknowledges that pt will require placement at d/c.   Patient/Family's Understanding of and Emotional Response to Diagnosis, Current Treatment, and Prognosis:  Pt's son aware of admission diagnosis and that pt is likely stable for d/c today.   Emotional Assessment Appearance:  Appears stated age Attitude/Demeanor/Rapport:  Unable to Assess Affect (typically observed):  Unable to Assess Orientation:  Oriented to Self Alcohol / Substance use:  Not Applicable Psych involvement (Current and /or in the community):  No (Comment)  Discharge Needs  Concerns to be addressed:  Discharge Planning Concerns Readmission within the last 30 days:  No Current discharge risk:  Physical Impairment Barriers to Discharge:  No Barriers Identified   [name], LCSW 02/20/2017, 2:33  PM [phone]

## 2017-02-28 DIAGNOSIS — E1149 Type 2 diabetes mellitus with other diabetic neurological complication: Secondary | ICD-10-CM

## 2017-02-28 DIAGNOSIS — E1129 Type 2 diabetes mellitus with other diabetic kidney complication: Secondary | ICD-10-CM

## 2017-02-28 DIAGNOSIS — G909 Disorder of the autonomic nervous system, unspecified: Secondary | ICD-10-CM

## 2017-02-28 DIAGNOSIS — S82102G Unspecified fracture of upper end of left tibia, subsequent encounter for closed fracture with delayed healing: Secondary | ICD-10-CM

## 2017-02-28 DIAGNOSIS — S82102A Unspecified fracture of upper end of left tibia, initial encounter for closed fracture: Secondary | ICD-10-CM

## 2017-02-28 DIAGNOSIS — S82832A Other fracture of upper and lower end of left fibula, initial encounter for closed fracture: Secondary | ICD-10-CM

## 2017-02-28 LAB — GLUCOSE, CAPILLARY
GLUCOSE-CAPILLARY: 121 mg/dL — AB (ref 65–99)
GLUCOSE-CAPILLARY: 145 mg/dL — AB (ref 65–99)
GLUCOSE-CAPILLARY: 157 mg/dL — AB (ref 65–99)
Glucose-Capillary: 116 mg/dL — ABNORMAL HIGH (ref 65–99)
Glucose-Capillary: 150 mg/dL — ABNORMAL HIGH (ref 65–99)
Glucose-Capillary: 169 mg/dL — ABNORMAL HIGH (ref 65–99)

## 2017-02-28 LAB — CBC
HEMATOCRIT: 26.9 % — AB (ref 39.0–52.0)
HEMOGLOBIN: 9.2 g/dL — AB (ref 13.0–17.0)
MCH: 31.8 pg (ref 26.0–34.0)
MCHC: 34.2 g/dL (ref 30.0–36.0)
MCV: 93.1 fL (ref 78.0–100.0)
Platelets: 409 10*3/uL — ABNORMAL HIGH (ref 150–400)
RBC: 2.89 MIL/uL — ABNORMAL LOW (ref 4.22–5.81)
RDW: 15.6 % — ABNORMAL HIGH (ref 11.5–15.5)
WBC: 8.7 10*3/uL (ref 4.0–10.5)

## 2017-02-28 LAB — BASIC METABOLIC PANEL
ANION GAP: 8 (ref 5–15)
BUN: 22 mg/dL — AB (ref 6–20)
CALCIUM: 8 mg/dL — AB (ref 8.9–10.3)
CO2: 29 mmol/L (ref 22–32)
CREATININE: 0.93 mg/dL (ref 0.61–1.24)
Chloride: 102 mmol/L (ref 101–111)
Glucose, Bld: 138 mg/dL — ABNORMAL HIGH (ref 65–99)
POTASSIUM: 3.3 mmol/L — AB (ref 3.5–5.1)
Sodium: 139 mmol/L (ref 135–145)

## 2017-02-28 MED ORDER — INSULIN ASPART 100 UNIT/ML ~~LOC~~ SOLN: 0.0000 [IU] | Freq: Every day | SUBCUTANEOUS | Status: DC

## 2017-02-28 MED ORDER — INSULIN ASPART 100 UNIT/ML ~~LOC~~ SOLN
0.0000 [IU] | Freq: Three times a day (TID) | SUBCUTANEOUS | Status: DC
  Administered 2017-02-28: 1 [IU] via SUBCUTANEOUS
  Administered 2017-03-01: 3 [IU] via SUBCUTANEOUS
  Administered 2017-03-01: 5 [IU] via SUBCUTANEOUS

## 2017-02-28 MED ORDER — POTASSIUM CHLORIDE CRYS ER 20 MEQ PO TBCR
40.0000 meq | EXTENDED_RELEASE_TABLET | Freq: Once | ORAL | Status: AC
  Administered 2017-02-28: 40 meq via ORAL
  Filled 2017-02-28: qty 2

## 2017-02-28 NOTE — Consult Note (Signed)
Reason for Consult: Fracture of the left proximal tibia Referring Physician: Dr. Leandra Kern is an 83 y.o. male.  HPI: 83 year old male had a prior femur fracture on the left treated with IM nail fractured his proximal tibia in [place]. He was deemed non-surgical candidate and therefore was placed in a long-leg cast. He presents to the hospital complaining of various things including I think some mental status changes and therefore was admitted for correction of that at the time they wanted his leg evaluated.  His x-ray shows a proximal tibia fracture apex anterior angulation and fracture of the lateral plateau which goes into the joint  Past Medical History:  Diagnosis Date  . Diabetes mellitus   . DVT (deep venous thrombosis) ([place])   . Gastroparesis   . Hypertension   . Legally blind    diabetic retinopathy  . Neuropathy ([place])   . Osteoporosis   . Pulmonary embolism ([place]) 12/2010   on blood thinners  . Stroke ([place])   . Weight loss     Past Surgical History:  Procedure Laterality Date  . ANKLE SURGERY     Patient states that he had pins place in the left foot  . BACK SURGERY  2008  . CARPAL TUNNEL RELEASE  08/2011   Left hand  . COLONOSCOPY  01/2011  . ORIF HIP FRACTURE  10/31/2011   Procedure: OPEN REDUCTION INTERNAL FIXATION HIP;  Surgeon: Arther Abbott, MD;  Location: [facility];  Service: Orthopedics;  Laterality: Right;  with Gamma Nail  . TRANSURETHRAL RESECTION OF PROSTATE  1984  . UPPER GASTROINTESTINAL ENDOSCOPY  01/2011    Family History  Problem Relation Age of Onset  . Diabetes Mother   . Diabetes Father   . Diabetes Sister   . Diabetes Brother   . Diabetes Sister   . Diabetes Son   . Healthy Son   . Healthy Son     Social History:  reports that he quit smoking about 35 years ago. His smoking use included Cigars. He quit after 20.00 years of use. He has never used smokeless tobacco. He reports that he does not drink alcohol or use  drugs.  Allergies: No Known Allergies  Medications: I have reviewed the patient's current medications.  Results for orders placed or performed during the hospital encounter of 02/26/17 (from the past 48 hour(s))  CBG monitoring, ED     Status: Abnormal   Collection Time: 02/26/17 12:50 PM  Result Value Ref Range   Glucose-Capillary 229 (H) 65 - 99 mg/dL  Comprehensive metabolic panel     Status: Abnormal   Collection Time: 02/26/17 12:52 PM  Result Value Ref Range   Sodium 138 135 - 145 mmol/L   Potassium 4.1 3.5 - 5.1 mmol/L   Chloride 98 (L) 101 - 111 mmol/L   CO2 33 (H) 22 - 32 mmol/L   Glucose, Bld 235 (H) 65 - 99 mg/dL   BUN 29 (H) 6 - 20 mg/dL   Creatinine, Ser 1.10 0.61 - 1.24 mg/dL   Calcium 8.9 8.9 - 10.3 mg/dL   Total Protein 5.4 (L) 6.5 - 8.1 g/dL   Albumin 2.7 (L) 3.5 - 5.0 g/dL   AST 20 15 - 41 U/L   ALT 13 (L) 17 - 63 U/L   Alkaline Phosphatase 139 (H) 38 - 126 U/L   Total Bilirubin 0.8 0.3 - 1.2 mg/dL   GFR calc non Af Amer 57 (L) >60 mL/min   GFR calc  Af Amer >60 >60 mL/min    Comment: (NOTE) The eGFR has been calculated using the CKD EPI equation. This calculation has not been validated in all clinical situations. eGFR's persistently <60 mL/min signify possible Chronic Kidney Disease.    Anion gap 7 5 - 15  CBC with Differential     Status: Abnormal   Collection Time: 02/26/17 12:52 PM  Result Value Ref Range   WBC 14.1 (H) 4.0 - 10.5 K/uL   RBC 3.45 (L) 4.22 - 5.81 MIL/uL   Hemoglobin 10.7 (L) 13.0 - 17.0 g/dL   HCT 32.1 (L) 39.0 - 52.0 %   MCV 93.0 78.0 - 100.0 fL   MCH 31.0 26.0 - 34.0 pg   MCHC 33.3 30.0 - 36.0 g/dL   RDW 15.0 11.5 - 15.5 %   Platelets 463 (H) 150 - 400 K/uL   Neutrophils Relative % 85 %   Neutro Abs 12.0 (H) 1.7 - 7.7 K/uL   Lymphocytes Relative 6 %   Lymphs Abs 0.8 0.7 - 4.0 K/uL   Monocytes Relative 8 %   Monocytes Absolute 1.1 (H) 0.1 - 1.0 K/uL   Eosinophils Relative 1 %   Eosinophils Absolute 0.1 0.0 - 0.7 K/uL    Basophils Relative 0 %   Basophils Absolute 0.0 0.0 - 0.1 K/uL  Blood gas, venous     Status: Abnormal   Collection Time: 02/26/17  1:04 PM  Result Value Ref Range   FIO2 0.21    pH, Ven 7.426 7.250 - 7.430   pCO2, Ven 49.7 44.0 - 60.0 mmHg   pO2, Ven 61.4 (H) 32.0 - 45.0 mmHg   Bicarbonate 30.8 (H) 20.0 - 28.0 mmol/L   Acid-Base Excess 7.6 (H) 0.0 - 2.0 mmol/L   O2 Saturation 90.7 %   Patient temperature 37.0    Collection site VENOUS    Drawn by DRAWN BY RN    Sample type VENOUS   Urinalysis, Routine w reflex microscopic     Status: Abnormal   Collection Time: 02/26/17  1:05 PM  Result Value Ref Range   Color, Urine YELLOW YELLOW   APPearance CLOUDY (A) CLEAR   Specific Gravity, Urine 1.024 1.005 - 1.030   pH 5.0 5.0 - 8.0   Glucose, UA 150 (A) NEGATIVE mg/dL   Hgb urine dipstick LARGE (A) NEGATIVE   Bilirubin Urine NEGATIVE NEGATIVE   Ketones, ur 5 (A) NEGATIVE mg/dL   Protein, ur 30 (A) NEGATIVE mg/dL   Nitrite NEGATIVE NEGATIVE   Leukocytes, UA LARGE (A) NEGATIVE   RBC / HPF TOO NUMEROUS TO COUNT 0 - 5 RBC/hpf   WBC, UA TOO NUMEROUS TO COUNT 0 - 5 WBC/hpf   Bacteria, UA FEW (A) NONE SEEN   Squamous Epithelial / LPF 0-5 (A) NONE SEEN   WBC Clumps PRESENT    Non Squamous Epithelial 0-5 (A) NONE SEEN  I-stat troponin, ED     Status: None   Collection Time: 02/26/17  1:16 PM  Result Value Ref Range   Troponin i, poc 0.01 0.00 - 0.08 ng/mL   Comment 3            Comment: Due to the release kinetics of cTnI, a negative result within the first hours of the onset of symptoms does not rule out myocardial infarction with certainty. If myocardial infarction is still suspected, repeat the test at appropriate intervals.   I-Stat CG4 Lactic Acid, ED     Status: Abnormal   Collection Time: 02/26/17  1:18 PM  Result Value Ref Range   Lactic Acid, Venous 2.75 (HH) 0.5 - 1.9 mmol/L   Comment NOTIFIED PHYSICIAN   Blood Culture (routine x 2)     Status: None (Preliminary result)    Collection Time: 02/26/17  1:52 PM  Result Value Ref Range   Specimen Description BLOOD LEFT ANTECUBITAL    Special Requests BOTTLES DRAWN AEROBIC AND ANAEROBIC 10 CC EACH    Culture NO GROWTH 2 DAYS    Report Status PENDING   Blood Culture (routine x 2)     Status: None (Preliminary result)   Collection Time: 02/26/17  1:58 PM  Result Value Ref Range   Specimen Description BLOOD BLOOD LEFT HAND    Special Requests BOTTLES DRAWN AEROBIC ONLY 10 CC    Culture NO GROWTH 2 DAYS    Report Status PENDING   Lactic acid, plasma     Status: None   Collection Time: 02/26/17  6:25 PM  Result Value Ref Range   Lactic Acid, Venous 1.3 0.5 - 1.9 mmol/L  Procalcitonin     Status: None   Collection Time: 02/26/17  6:25 PM  Result Value Ref Range   Procalcitonin 0.13 ng/mL    Comment:        Interpretation: PCT (Procalcitonin) <= 0.5 ng/mL: Systemic infection (sepsis) is not likely. Local bacterial infection is possible. (NOTE)         ICU PCT Algorithm               Non ICU PCT Algorithm    ----------------------------     ------------------------------         PCT < 0.25 ng/mL                 PCT < 0.1 ng/mL     Stopping of antibiotics            Stopping of antibiotics       strongly encouraged.               strongly encouraged.    ----------------------------     ------------------------------       PCT level decrease by               PCT < 0.25 ng/mL       >= 80% from peak PCT       OR PCT 0.25 - 0.5 ng/mL          Stopping of antibiotics                                             encouraged.     Stopping of antibiotics           encouraged.    ----------------------------     ------------------------------       PCT level decrease by              PCT >= 0.25 ng/mL       < 80% from peak PCT        AND PCT >= 0.5 ng/mL            Continuin g antibiotics  encouraged.       Continuing antibiotics            encouraged.     ----------------------------     ------------------------------     PCT level increase compared          PCT > 0.5 ng/mL         with peak PCT AND          PCT >= 0.5 ng/mL             Escalation of antibiotics                                          strongly encouraged.      Escalation of antibiotics        strongly encouraged.   Protime-INR     Status: Abnormal   Collection Time: 02/26/17  6:25 PM  Result Value Ref Range   Prothrombin Time 28.6 (H) 11.4 - 15.2 seconds   INR 2.63   APTT     Status: Abnormal   Collection Time: 02/26/17  6:25 PM  Result Value Ref Range   aPTT 42 (H) 24 - 36 seconds    Comment:        IF BASELINE aPTT IS ELEVATED, SUGGEST PATIENT RISK ASSESSMENT BE USED TO DETERMINE APPROPRIATE ANTICOAGULANT THERAPY.   MRSA PCR Screening     Status: None   Collection Time: 02/26/17  7:32 PM  Result Value Ref Range   MRSA by PCR NEGATIVE NEGATIVE    Comment:        The GeneXpert MRSA Assay (FDA approved for NASAL specimens only), is one component of a comprehensive MRSA colonization surveillance program. It is not intended to diagnose MRSA infection nor to guide or monitor treatment for MRSA infections.   Lactic acid, plasma     Status: None   Collection Time: 02/26/17  8:51 PM  Result Value Ref Range   Lactic Acid, Venous 1.0 0.5 - 1.9 mmol/L  Glucose, capillary     Status: Abnormal   Collection Time: 02/26/17  9:30 PM  Result Value Ref Range   Glucose-Capillary 338 (H) 65 - 99 mg/dL  Basic metabolic panel     Status: Abnormal   Collection Time: 02/27/17  4:36 AM  Result Value Ref Range   Sodium 137 135 - 145 mmol/L   Potassium 4.6 3.5 - 5.1 mmol/L   Chloride 97 (L) 101 - 111 mmol/L   CO2 25 22 - 32 mmol/L   Glucose, Bld 334 (H) 65 - 99 mg/dL   BUN 24 (H) 6 - 20 mg/dL   Creatinine, Ser 1.14 0.61 - 1.24 mg/dL   Calcium 8.3 (L) 8.9 - 10.3 mg/dL   GFR calc non Af Amer 55 (L) >60 mL/min   GFR calc Af Amer >60 >60 mL/min    Comment: (NOTE) The  eGFR has been calculated using the CKD EPI equation. This calculation has not been validated in all clinical situations. eGFR's persistently <60 mL/min signify possible Chronic Kidney Disease.    Anion gap 15 5 - 15  CBC     Status: Abnormal   Collection Time: 02/27/17  4:36 AM  Result Value Ref Range   WBC 11.4 (H) 4.0 - 10.5 K/uL   RBC 2.97 (L) 4.22 - 5.81 MIL/uL   Hemoglobin 9.2 (L) 13.0 - 17.0 g/dL   HCT 27.9 (L)  39.0 - 52.0 %   MCV 93.9 78.0 - 100.0 fL   MCH 31.0 26.0 - 34.0 pg   MCHC 33.0 30.0 - 36.0 g/dL   RDW 15.5 11.5 - 15.5 %   Platelets 397 150 - 400 K/uL  Magnesium     Status: None   Collection Time: 02/27/17  4:36 AM  Result Value Ref Range   Magnesium 2.1 1.7 - 2.4 mg/dL  Glucose, capillary     Status: Abnormal   Collection Time: 02/27/17  7:51 AM  Result Value Ref Range   Glucose-Capillary 355 (H) 65 - 99 mg/dL   Comment 1 Notify RN    Comment 2 Document in Chart   Glucose, capillary     Status: Abnormal   Collection Time: 02/27/17 11:36 AM  Result Value Ref Range   Glucose-Capillary 322 (H) 65 - 99 mg/dL   Comment 1 Notify RN    Comment 2 Document in Chart   Glucose, capillary     Status: Abnormal   Collection Time: 02/27/17  4:36 PM  Result Value Ref Range   Glucose-Capillary 213 (H) 65 - 99 mg/dL   Comment 1 Notify RN    Comment 2 Document in Chart   Glucose, capillary     Status: Abnormal   Collection Time: 02/27/17  9:16 PM  Result Value Ref Range   Glucose-Capillary 220 (H) 65 - 99 mg/dL   Comment 1 Notify RN    Comment 2 Document in Chart   Glucose, capillary     Status: Abnormal   Collection Time: 02/28/17  4:06 AM  Result Value Ref Range   Glucose-Capillary 121 (H) 65 - 99 mg/dL   Comment 1 Notify RN    Comment 2 Document in Chart   CBC     Status: Abnormal   Collection Time: 02/28/17  5:02 AM  Result Value Ref Range   WBC 8.7 4.0 - 10.5 K/uL   RBC 2.89 (L) 4.22 - 5.81 MIL/uL   Hemoglobin 9.2 (L) 13.0 - 17.0 g/dL   HCT 26.9 (L) 39.0 -  52.0 %   MCV 93.1 78.0 - 100.0 fL   MCH 31.8 26.0 - 34.0 pg   MCHC 34.2 30.0 - 36.0 g/dL   RDW 15.6 (H) 11.5 - 15.5 %   Platelets 409 (H) 150 - 400 K/uL  Basic metabolic panel     Status: Abnormal   Collection Time: 02/28/17  5:02 AM  Result Value Ref Range   Sodium 139 135 - 145 mmol/L   Potassium 3.3 (L) 3.5 - 5.1 mmol/L    Comment: DELTA CHECK NOTED   Chloride 102 101 - 111 mmol/L   CO2 29 22 - 32 mmol/L   Glucose, Bld 138 (H) 65 - 99 mg/dL   BUN 22 (H) 6 - 20 mg/dL   Creatinine, Ser 0.93 0.61 - 1.24 mg/dL   Calcium 8.0 (L) 8.9 - 10.3 mg/dL   GFR calc non Af Amer >60 >60 mL/min   GFR calc Af Amer >60 >60 mL/min    Comment: (NOTE) The eGFR has been calculated using the CKD EPI equation. This calculation has not been validated in all clinical situations. eGFR's persistently <60 mL/min signify possible Chronic Kidney Disease.    Anion gap 8 5 - 15  Glucose, capillary     Status: Abnormal   Collection Time: 02/28/17  7:50 AM  Result Value Ref Range   Glucose-Capillary 116 (H) 65 - 99 mg/dL   Comment 1 Notify RN  Comment 2 Document in Chart     Dg Chest 2 View  Result Date: 02/26/2017 CLINICAL DATA:  Altered mental status EXAM: CHEST  2 VIEW COMPARISON:  October 29, 2011 FINDINGS: There is no edema or consolidation. Heart size and pulmonary vascularity are normal. No adenopathy. There is degenerative change in the lower thoracic region. IMPRESSION: No edema or consolidation. Electronically Signed   By: Lowella Grip III M.D.   On: 02/26/2017 14:40   Dg Knee Left Port  Result Date: 02/27/2017 CLINICAL DATA:  Fall, 02/15/2017, left femur fracture EXAM: PORTABLE LEFT KNEE - 1-2 VIEW COMPARISON:  01/09/2016 FINDINGS: Two views of the left knee submitted. Study is markedly limited by casting material artifact. Partially visualized intramedullary rod in distal left femur. There is mild displaced minimal angulated fracture in proximal left tibia. Please note the fracture line  extends oblique into the lateral tibial plateau. Minimal displaced impacted fracture proximal left fibula IMPRESSION: Study is markedly limited by casting material artifact. Partially visualized intramedullary rod in distal left femur. There is mild displaced minimal angulated fracture in proximal left tibia. Please note the fracture line extends oblique into the lateral tibial plateau. Minimal displaced impacted fracture proximal left fibula. Electronically Signed   By: Lahoma Crocker M.D.   On: 02/27/2017 11:25   Dg Tibia/fibula Left Port  Result Date: 02/27/2017 CLINICAL DATA:  Left lower leg fracture.  Fall 02/15/2017. EXAM: PORTABLE LEFT TIBIA AND FIBULA - 2 VIEW COMPARISON:  Left femur radiographs 01/09/2016 FINDINGS: A comminuted proximal tibia fracture demonstrates anterior angulation of 20 degrees. A lateral tibial plateau fracture is present. A proximal fibular head fracture is again seen. Distal tibia and fibula ORIF is noted. The study is limited by overlying fiberglass cast. An IM rod is again seen within the distal femur. IMPRESSION: 1. Comminuted proximal tibial fracture with anterior angulation of approximately 20 degrees. 2. Lateral tibial plateau fracture. 3. Proximal fibular head fracture. 4. Inter medullary rod of the femur. 5. Previous ORIF of the distal tibia and fibula. Electronically Signed   By: San Morelle M.D.   On: 02/27/2017 15:50   Dg Femur Port Min 2 Views Left  Result Date: 02/27/2017 CLINICAL DATA:  Fall. EXAM: LEFT FEMUR PORTABLE 2 VIEWS COMPARISON:  01/09/2016. FINDINGS: ORIF left femur. Hardware intact. Posttraumatic bony changes are stable. No acute bony abnormality. Peripheral vascular disease . Penile prosthesis. IMPRESSION: 1. ORIF left femur. Hardware intact. Anatomic alignment. Posttraumatic bony changes are stable. No acute bony abnormality identified. 2. Peripheral vascular disease. Electronically Signed   By: [place]   On: 02/27/2017 11:13     Review of Systems  Constitutional: Negative for fever, malaise/fatigue and weight loss.  Musculoskeletal: Positive for joint pain and myalgias.  Psychiatric/Behavioral: Positive for memory loss.   Blood pressure (!) 135/50, pulse 87, temperature 98.2 F (36.8 C), temperature source Oral, resp. rate 18, height '5\' 8"'  (1.727 m), weight 138 lb (62.6 kg), SpO2 94 %. Physical Exam  Constitutional: He appears well-developed. No distress.  HENT:  Head: Normocephalic and atraumatic.  Mouth/Throat: No oropharyngeal exudate.  Eyes: Conjunctivae are normal. Right eye exhibits no discharge. Left eye exhibits no discharge.  Neck: No JVD present. No tracheal deviation present.  Cardiovascular: Intact distal pulses.   Respiratory: No stridor.  Musculoskeletal:  There is a cast on his left leg is a long-leg cast is well fitting checked the distal and proximal areas and there was no pressure tension or tightness area  Color of his toes  were normal he can move all of them  Lymphadenopathy:    He has no cervical adenopathy.  Neurological: He is alert. He exhibits normal muscle tone.  Oriented to person and place  Skin: Skin is warm and dry. He is not diaphoretic.  Psychiatric: He has a normal mood and affect. His behavior is normal.    Assessment/Plan: Femur and tibia x-rays were ordered and evaluated. I interpreted the x-rays as a old femur fracture with IM nail stable a proximal tibia fracture with apex anterior angulation.  Recommend continue casting. I don't think he is a surgical candidate at least in my hands at this hospital  I will speak to a family member regarding what they want done think is high risk patient  Although casting is not the optimal treatment it may be the optimal treatment in this case  Arther Abbott 02/28/2017, 10:05 AM

## 2017-02-28 NOTE — Evaluation (Signed)
Clinical/Bedside Swallow Evaluation Patient Details  Name: Eduardo Lee MRN: [id number] Date of Birth: 09-07-1926  Today's Date: 02/28/2017 Time: SLP Start Time (ACUTE ONLY): 66 SLP Stop Time (ACUTE ONLY): 1352 SLP Time Calculation (min) (ACUTE ONLY): 23 min  Past Medical History:  Past Medical History:  Diagnosis Date  . Diabetes mellitus   . DVT (deep venous thrombosis) ([place])   . Gastroparesis   . Hypertension   . Legally blind    diabetic retinopathy  . Neuropathy ([place])   . Osteoporosis   . Pulmonary embolism ([place]) 12/2010   on blood thinners  . Stroke ([place])   . Weight loss    Past Surgical History:  Past Surgical History:  Procedure Laterality Date  . ANKLE SURGERY     Patient states that he had pins place in the left foot  . BACK SURGERY  2008  . CARPAL TUNNEL RELEASE  08/2011   Left hand  . COLONOSCOPY  01/2011  . ORIF HIP FRACTURE  10/31/2011   Procedure: OPEN REDUCTION INTERNAL FIXATION HIP;  Surgeon: Arther Abbott, MD;  Location: [facility];  Service: Orthopedics;  Laterality: Right;  with Gamma Nail  . TRANSURETHRAL RESECTION OF PROSTATE  1984  . UPPER GASTROINTESTINAL ENDOSCOPY  01/2011   HPI:  83 year old male with a history of diabetes mellitus, hypertension, stroke, DVT/PE presenting from the [facility] increasing confusion. Apparently, the patient had a mechanical fall at her restaurant in [place] resulting in a left distal femur fracture. He was seen at a hospital in [place], but he was not deemed to be a good surgical candidate by orthopedics. A long-leg cast was placed in [place] on 02/17/2017. The patient's family brought the patient back to [place], but noted that he had increasing confusion. He was admitted for 02/19/2017 through 02/20/2017 after which he was discharged to the [facility].Notably, the patient had a recent Foley catheter placed on 02/21/2017 secondary to urinary retention. He failed a voiding trial on  02/25/2017 and Foley catheter had to be placed back. In the past 24-48 hours, the patient's family has noted increasing confusion. As a result, the patient was brought to the hospital for further evaluation   Assessment / Plan / Recommendation Clinical Impression  Pt was evaluated at bedside, Pt was easily roused and was pleasant throughout evaluation. Pt is known to SLP from SNF setting. Pt demonstrated no overt s/sx of aspiration with any textures/consistencies presented (thin via straw and puree/mech soft textures). Pt's sitter/caregiver was present for evaluation reporting if he "drinks too fast" he gets "choked" or if he takes "big bites" or eats dry food he has some difficulty. Recommend downgrade diet to D2/ground with extra gravy to facilitate moist textures and continue with thin liquids, straws are ok. Pt should take small bites/sips, always be seated at 90 degrees, and alternate bites/sips and follow standard aspiration precautions. Recommend meds to be crushed in puree. Pt benefits from 1:1 feeder (provided by private sitter at most meals) to ensure compliance with above compensatory strategies. There are no further ST needs at this time. ST to sign off. SLP Visit Diagnosis: Dysphagia, oropharyngeal phase (R13.12)    Aspiration Risk  Mild aspiration risk    Diet Recommendation Dysphagia 2 (Fine chop);Thin liquid   Liquid Administration via: Cup;Straw Medication Administration: Crushed with puree Supervision: Full supervision/cueing for compensatory strategies Compensations: Minimize environmental distractions;Slow rate;Small sips/bites;Multiple dry swallows after each bite/sip;Clear throat intermittently Postural Changes: Seated upright at 90 degrees  Other  Recommendations Oral Care Recommendations: Oral care BID   Follow up Recommendations Skilled Nursing facility;24 hour supervision/assistance      Frequency and Duration            Prognosis Prognosis for Safe Diet  Advancement: Guarded      Swallow Study   General Date of Onset: 02/26/17 HPI: 83 year old male with a history of diabetes mellitus, hypertension, stroke, DVT/PE presenting from the [place] increasing confusion. Apparently, the patient had a mechanical fall at her restaurant in [place] resulting in a left distal femur fracture. He was seen at a hospital in [place], but he was not deemed to be a good surgical candidate by orthopedics. A long-leg cast was placed in [place] on 02/17/2017. The patient's family brought the patient back to [place], but noted that he had increasing confusion. He was admitted for 02/19/2017 through 02/20/2017 after which he was discharged to the [facility].Notably, the patient had a recent Foley catheter placed on 02/21/2017 secondary to urinary retention. He failed a voiding trial on 02/25/2017 and Foley catheter had to be placed back. In the past 24-48 hours, the patient's family has noted increasing confusion. As a result, the patient was brought to the hospital for further evaluation Type of Study: Bedside Swallow Evaluation Previous Swallow Assessment: none Diet Prior to this Study: Regular;Thin liquids Temperature Spikes Noted: No Respiratory Status: Room air History of Recent Intubation: No Behavior/Cognition: Alert;Cooperative;Pleasant mood Oral Cavity Assessment: Within Functional Limits;Dry Oral Care Completed by SLP: Recent completion by staff Oral Cavity - Dentition: Adequate natural dentition Vision: Functional for self-feeding Self-Feeding Abilities: Total assist Patient Positioning: Upright in bed Baseline Vocal Quality: Normal Volitional Cough: Strong Volitional Swallow: Able to elicit    Oral/Motor/Sensory Function Overall Oral Motor/Sensory Function: Within functional limits   Ice Chips Ice chips: Within functional limits   Thin Liquid Thin Liquid: Within functional limits    Nectar Thick Nectar Thick  Liquid: Not tested   Honey Thick Honey Thick Liquid: Not tested   Puree Puree: Within functional limits   Solid      My Rinke H. Roddie Mc, CCC-SLP Speech Language Pathologist  Solid: Within functional limits        Wende Bushy 02/28/2017,2:03 PM

## 2017-02-28 NOTE — Progress Notes (Signed)
PROGRESS NOTE  Eduardo Lee WTU:[id number] DOB: Apr 17, 1926 DOA: 02/26/2017 PCP: Mickie Hillier, MD   Brief History:  83 year old male with a history of diabetes mellitus, hypertension, stroke, DVT/PE presenting from the [facility] with increasing confusion. Apparently, the patient had a mechanical fall at her restaurant in [place] resulting in a left distal femur fracture. He was seen at a hospital in [place], but he was not deemed to be a good surgical candidate by orthopedics. A long-leg cast was placed in [place] on 02/17/2017. The patient's family brought the patient back to [place], but noted that he had increasing confusion. He was admitted for 02/19/2017 through 02/20/2017 after which he was discharged to the [facility]. Notably, the patient had a recent Foley catheter placed on 02/21/2017 secondary to urinary retention. He failed a voiding trial on 02/25/2017 and Foley catheter had to be placed back. In the past 24-48 hours, the patient's family has noted increasing confusion. As a result, the patient was brought to the hospital for further evaluation  Assessment/Plan: Acute metabolic encephalopathy -Multifactorial including UTI, dehydration, and sedative medications -Continue cefepime pending culture data -Mental status is slightly better on the morning of 02/27/2017-->near baseline on 02/28/17  Sepsis -Patient presented with leukocytosis and elevated lactic acid -Secondary to urinary source -UA with TNTC WBC -Lactic acid peaked at 2.75 -Elevated coags likely secondary to rivaroxaban  UTI--GNR -continue cefepime pending culture data  Left proximal tibia fracture -appreciate Ortho consult-->poor surgical candidate -Left leg cast was placed on 02/17/2017 in [place] -Left tib-fib x-ray--comminuted proximal tibia fracture with anterior angulation, lateral tibial plateau fracture, proximal fibular head  fracture  Dehydration -Secondary to cellular diuresis from poorly controlled diabetes mellitus -The patient may have had hyperosmolar state on 02/25/2017 with a serum glucose up to 440 -improved with IVF  Diabetes mellitus type 2, uncontrolled with gastroparesis -Hemoglobin A1c--pending -Start Lantus 6 units daily -NovoLog sliding scale  CKD stage 2-3 -Baseline creatinine 1.0-1.3  DVT/PE history -Continue rivaroxaban  Unstageable sacral pressure injury -Present at the time of admission -Appreciate wound care consult  Hypokalemia -replete -check mag   Disposition Plan:   SNF 3/31 or 4/1 Family Communication:  Son updated on phone 3/30  Consultants:  Ortho  Code Status:  DNR  DVT Prophylaxis:  Xarelto   Procedures: As Listed in Progress Note Above  Antibiotics: Cefepime 3/28>>>    Subjective: Patient denies fevers, chills, headache, chest pain, dyspnea, nausea, vomiting, diarrhea, abdominal pain, dysuria, hematuria, hematochezia, and melena.   Objective: Vitals:   02/26/17 2300 02/27/17 0457 02/27/17 2156 02/28/17 0502  BP: (!) 120/50 (!) 119/39 (!) 130/52 (!) 135/50  Pulse: 97 93 83 87  Resp: 20 20 20 18   Temp: 98.1 F (36.7 C) 98.4 F (36.9 C) 99 F (37.2 C) 98.2 F (36.8 C)  TempSrc: Oral Oral Oral Oral  SpO2: 98% 96% 90% 94%  Weight:      Height:        Intake/Output Summary (Last 24 hours) at 02/28/17 1216 Last data filed at 02/28/17 0900  Gross per 24 hour  Intake          2133.33 ml  Output             1190 ml  Net           943.33 ml   Weight change:  Exam:   General:  Pt is alert, follows commands appropriately, not in  acute distress  HEENT: No icterus, No thrush, No neck mass, [place]/AT  Cardiovascular: RRR, S1/S2, no rubs, no gallops  Respiratory: Fine bibasilar crackles. No wheeze  Abdomen: Soft/+BS, non tender, non distended, no guarding  Extremities: No edema, No lymphangitis, No petechiae, No rashes, no  synovitis; left lower extremity in fiberglass cast   Data Reviewed: I have personally reviewed following labs and imaging studies Basic Metabolic Panel:  Recent Labs Lab 02/22/17 0730 02/25/17 0700 02/26/17 1252 02/27/17 0436 02/28/17 0502  NA 137 136 138 137 139  K 4.6 4.8 4.1 4.6 3.3*  CL 101 96* 98* 97* 102  CO2 17* 22 33* 25 29  GLUCOSE 470* 440* 235* 334* 138*  BUN 32* 32* 29* 24* 22*  CREATININE 1.50* 1.30* 1.10 1.14 0.93  CALCIUM 8.8* 8.9 8.9 8.3* 8.0*  MG  --   --   --  2.1  --    Liver Function Tests:  Recent Labs Lab 02/26/17 1252  AST 20  ALT 13*  ALKPHOS 139*  BILITOT 0.8  PROT 5.4*  ALBUMIN 2.7*   No results for input(s): LIPASE, AMYLASE in the last 168 hours. No results for input(s): AMMONIA in the last 168 hours. Coagulation Profile:  Recent Labs Lab 02/26/17 1825  INR 2.63   CBC:  Recent Labs Lab 02/21/17 1411 02/22/17 0730 02/25/17 0700 02/26/17 1252 02/27/17 0436 02/28/17 0502  WBC 8.1 9.9 8.4 14.1* 11.4* 8.7  NEUTROABS 6.7 8.4*  --  12.0*  --   --   HGB 9.2* 10.0* 10.2* 10.7* 9.2* 9.2*  HCT 27.3* 30.4* 30.8* 32.1* 27.9* 26.9*  MCV 91.6 94.7 93.6 93.0 93.9 93.1  PLT 317 321 402* 463* 397 409*   Cardiac Enzymes: No results for input(s): CKTOTAL, CKMB, CKMBINDEX, TROPONINI in the last 168 hours. BNP: Invalid input(s): POCBNP CBG:  Recent Labs Lab 02/27/17 2116 02/28/17 0006 02/28/17 0406 02/28/17 0750 02/28/17 1119  GLUCAP 220* 150* 121* 116* 169*   HbA1C: No results for input(s): HGBA1C in the last 72 hours. Urine analysis:    Component Value Date/Time   COLORURINE YELLOW 02/26/2017 1305   APPEARANCEUR CLOUDY (A) 02/26/2017 1305   LABSPEC 1.024 02/26/2017 1305   PHURINE 5.0 02/26/2017 1305   GLUCOSEU 150 (A) 02/26/2017 1305   HGBUR LARGE (A) 02/26/2017 1305   BILIRUBINUR NEGATIVE 02/26/2017 1305   KETONESUR 5 (A) 02/26/2017 1305   PROTEINUR 30 (A) 02/26/2017 1305   UROBILINOGEN 0.2 10/29/2011 0555   NITRITE  NEGATIVE 02/26/2017 1305   LEUKOCYTESUR LARGE (A) 02/26/2017 1305   Sepsis Labs: @LABRCNTIP (procalcitonin:4,lacticidven:4) ) Recent Results (from the past 240 hour(s))  Culture, Urine     Status: Abnormal   Collection Time: 02/20/17 11:50 AM  Result Value Ref Range Status   Specimen Description URINE, CLEAN CATCH  Final   Special Requests NONE  Final   Culture (A)  Final    <10,000 COLONIES/mL INSIGNIFICANT GROWTH Performed at [facility], [place] [address] [place], [place] [address]    Report Status 02/22/2017 FINAL  Final  Culture, Urine     Status: None   Collection Time: 02/21/17  3:00 AM  Result Value Ref Range Status   Specimen Description URINE, RANDOM  Final   Special Requests NONE  Final   Culture   Final    NO GROWTH Performed at [facility], [place] [address], [place], [place] [address]    Report Status 02/22/2017 FINAL  Final  Blood Culture (routine x 2)  Status: None (Preliminary result)   Collection Time: 02/26/17  1:52 PM  Result Value Ref Range Status   Specimen Description BLOOD LEFT ANTECUBITAL  Final   Special Requests BOTTLES DRAWN AEROBIC AND ANAEROBIC 10 CC EACH  Final   Culture NO GROWTH 2 DAYS  Final   Report Status PENDING  Incomplete  Blood Culture (routine x 2)     Status: None (Preliminary result)   Collection Time: 02/26/17  1:58 PM  Result Value Ref Range Status   Specimen Description BLOOD BLOOD LEFT HAND  Final   Special Requests BOTTLES DRAWN AEROBIC ONLY 10 CC  Final   Culture NO GROWTH 2 DAYS  Final   Report Status PENDING  Incomplete  MRSA PCR Screening     Status: None   Collection Time: 02/26/17  7:32 PM  Result Value Ref Range Status   MRSA by PCR NEGATIVE NEGATIVE Final    Comment:        The GeneXpert MRSA Assay (FDA approved for NASAL specimens only), is one component of a comprehensive MRSA colonization surveillance program. It is not intended to diagnose MRSA infection nor to guide or monitor treatment  for MRSA infections.   Culture, Urine     Status: Abnormal (Preliminary result)   Collection Time: 02/26/17  8:11 PM  Result Value Ref Range Status   Specimen Description URINE, RANDOM  Final   Special Requests NONE  Final   Culture >=100,000 COLONIES/mL GRAM NEGATIVE RODS (A)  Final   Report Status PENDING  Incomplete     Scheduled Meds: . aspirin  81 mg Oral q morning - 10a  . ceFEPime (MAXIPIME) IV  2 g Intravenous Q24H  . chlorhexidine  15 mL Mouth/Throat QID  . citalopram  20 mg Oral q morning - 10a  . collagenase   Topical Daily  . insulin aspart  0-9 Units Subcutaneous Q4H  . insulin glargine  6 Units Subcutaneous Daily  . latanoprost  1 drop Both Eyes QHS  . pantoprazole  80 mg Oral Daily  . rivaroxaban  20 mg Oral Q supper   Continuous Infusions:  Procedures/Studies: Dg Chest 2 View  Result Date: 02/26/2017 CLINICAL DATA:  Altered mental status EXAM: CHEST  2 VIEW COMPARISON:  October 29, 2011 FINDINGS: There is no edema or consolidation. Heart size and pulmonary vascularity are normal. No adenopathy. There is degenerative change in the lower thoracic region. IMPRESSION: No edema or consolidation. Electronically Signed   By: Lowella Grip III M.D.   On: 02/26/2017 14:40   Ct Head Wo Contrast  Result Date: 02/19/2017 CLINICAL DATA:  Altered mental status with history of fall EXAM: CT HEAD WITHOUT CONTRAST TECHNIQUE: Contiguous axial images were obtained from the base of the skull through the vertex without intravenous contrast. COMPARISON:  10/29/2011 FINDINGS: Brain: No acute territorial infarction, hemorrhage or mass is visualized. Moderate diffuse atrophy. Mild to moderate periventricular and subcortical white matter small vessel ischemic changes. Old lacunar infarcts within the thalamus and right basal ganglia. Enlarged ventricles are stable in size and felt secondary to atrophy. Vascular: No hyperdense vessels. Carotid artery calcifications and vertebral artery  calcifications. Skull: No fracture.  No suspicious bone lesion. Sinuses/Orbits: Mucosal thickening in the ethmoid and maxillary sinuses. Bony remottling of the left maxillary sinus wall consistent with chronic sinusitis. No acute orbital abnormality. Other: None IMPRESSION: 1. No CT evidence for acute intracranial abnormality 2. Atrophy with mild to moderate white matter small vessel ischemic changes. Electronically Signed   By:  Donavan Foil M.D.   On: 02/19/2017 16:57   Dg Knee Left Port  Result Date: 02/27/2017 CLINICAL DATA:  Fall, 02/15/2017, left femur fracture EXAM: PORTABLE LEFT KNEE - 1-2 VIEW COMPARISON:  01/09/2016 FINDINGS: Two views of the left knee submitted. Study is markedly limited by casting material artifact. Partially visualized intramedullary rod in distal left femur. There is mild displaced minimal angulated fracture in proximal left tibia. Please note the fracture line extends oblique into the lateral tibial plateau. Minimal displaced impacted fracture proximal left fibula IMPRESSION: Study is markedly limited by casting material artifact. Partially visualized intramedullary rod in distal left femur. There is mild displaced minimal angulated fracture in proximal left tibia. Please note the fracture line extends oblique into the lateral tibial plateau. Minimal displaced impacted fracture proximal left fibula. Electronically Signed   By: Lahoma Crocker M.D.   On: 02/27/2017 11:25   Dg Tibia/fibula Left Port  Result Date: 02/27/2017 CLINICAL DATA:  Left lower leg fracture.  Fall 02/15/2017. EXAM: PORTABLE LEFT TIBIA AND FIBULA - 2 VIEW COMPARISON:  Left femur radiographs 01/09/2016 FINDINGS: A comminuted proximal tibia fracture demonstrates anterior angulation of 20 degrees. A lateral tibial plateau fracture is present. A proximal fibular head fracture is again seen. Distal tibia and fibula ORIF is noted. The study is limited by overlying fiberglass cast. An IM rod is again seen within the  distal femur. IMPRESSION: 1. Comminuted proximal tibial fracture with anterior angulation of approximately 20 degrees. 2. Lateral tibial plateau fracture. 3. Proximal fibular head fracture. 4. Inter medullary rod of the femur. 5. Previous ORIF of the distal tibia and fibula. Electronically Signed   By: San Morelle M.D.   On: 02/27/2017 15:50   Dg Femur Port Min 2 Views Left  Result Date: 02/27/2017 CLINICAL DATA:  Fall. EXAM: LEFT FEMUR PORTABLE 2 VIEWS COMPARISON:  01/09/2016. FINDINGS: ORIF left femur. Hardware intact. Posttraumatic bony changes are stable. No acute bony abnormality. Peripheral vascular disease . Penile prosthesis. IMPRESSION: 1. ORIF left femur. Hardware intact. Anatomic alignment. Posttraumatic bony changes are stable. No acute bony abnormality identified. 2. Peripheral vascular disease. Electronically Signed   By: Marcello Moores  Register   On: 02/27/2017 11:13    Tula Schryver, DO  [facility] Pager [phone]  If 7PM-7AM, please contact night-coverage [email or web address] Password [email or web address] 02/28/2017, 12:16 PM   LOS: 2 days

## 2017-02-28 NOTE — Progress Notes (Signed)
LCSW following for disposition:  Return to SNF:  [facility]  LCSW discussed with facility current plan for patient and possible weekend discharge. Patient pending consult with ortho and medical clearance.  Patient can return to SNF over weekend if medically cleared.  Lane Hacker, MSW Clinical Social Work: [employer] Coverage for :  [phone]

## 2017-02-28 NOTE — Care Management Important Message (Signed)
Important Message  Patient Details  Name: Eduardo Lee MRN: [id number] Date of Birth: March 07, 1926   Medicare Important Message Given:  Yes    Sherald Barge, RN 02/28/2017, 9:44 AM

## 2017-03-01 DIAGNOSIS — I10 Essential (primary) hypertension: Secondary | ICD-10-CM | POA: Diagnosis not present

## 2017-03-01 DIAGNOSIS — E1140 Type 2 diabetes mellitus with diabetic neuropathy, unspecified: Secondary | ICD-10-CM | POA: Diagnosis not present

## 2017-03-01 DIAGNOSIS — Z794 Long term (current) use of insulin: Secondary | ICD-10-CM | POA: Diagnosis not present

## 2017-03-01 DIAGNOSIS — E1042 Type 1 diabetes mellitus with diabetic polyneuropathy: Secondary | ICD-10-CM | POA: Diagnosis not present

## 2017-03-01 DIAGNOSIS — S82102G Unspecified fracture of upper end of left tibia, subsequent encounter for closed fracture with delayed healing: Secondary | ICD-10-CM | POA: Diagnosis not present

## 2017-03-01 DIAGNOSIS — I129 Hypertensive chronic kidney disease with stage 1 through stage 4 chronic kidney disease, or unspecified chronic kidney disease: Secondary | ICD-10-CM | POA: Diagnosis not present

## 2017-03-01 DIAGNOSIS — Z87891 Personal history of nicotine dependence: Secondary | ICD-10-CM | POA: Diagnosis not present

## 2017-03-01 DIAGNOSIS — S82832A Other fracture of upper and lower end of left fibula, initial encounter for closed fracture: Secondary | ICD-10-CM | POA: Diagnosis not present

## 2017-03-01 DIAGNOSIS — G9341 Metabolic encephalopathy: Secondary | ICD-10-CM | POA: Diagnosis not present

## 2017-03-01 DIAGNOSIS — M810 Age-related osteoporosis without current pathological fracture: Secondary | ICD-10-CM | POA: Diagnosis not present

## 2017-03-01 DIAGNOSIS — E1022 Type 1 diabetes mellitus with diabetic chronic kidney disease: Secondary | ICD-10-CM | POA: Diagnosis not present

## 2017-03-01 DIAGNOSIS — M80051D Age-related osteoporosis with current pathological fracture, right femur, subsequent encounter for fracture with routine healing: Secondary | ICD-10-CM | POA: Diagnosis not present

## 2017-03-01 DIAGNOSIS — E103593 Type 1 diabetes mellitus with proliferative diabetic retinopathy without macular edema, bilateral: Secondary | ICD-10-CM | POA: Diagnosis not present

## 2017-03-01 DIAGNOSIS — S8290XD Unspecified fracture of unspecified lower leg, subsequent encounter for closed fracture with routine healing: Secondary | ICD-10-CM

## 2017-03-01 DIAGNOSIS — E1143 Type 2 diabetes mellitus with diabetic autonomic (poly)neuropathy: Secondary | ICD-10-CM | POA: Diagnosis not present

## 2017-03-01 DIAGNOSIS — M6281 Muscle weakness (generalized): Secondary | ICD-10-CM | POA: Diagnosis not present

## 2017-03-01 DIAGNOSIS — R4182 Altered mental status, unspecified: Secondary | ICD-10-CM | POA: Diagnosis not present

## 2017-03-01 DIAGNOSIS — R05 Cough: Secondary | ICD-10-CM | POA: Diagnosis not present

## 2017-03-01 DIAGNOSIS — S82132S Displaced fracture of medial condyle of left tibia, sequela: Secondary | ICD-10-CM | POA: Diagnosis not present

## 2017-03-01 DIAGNOSIS — Z7901 Long term (current) use of anticoagulants: Secondary | ICD-10-CM | POA: Diagnosis not present

## 2017-03-01 DIAGNOSIS — R609 Edema, unspecified: Secondary | ICD-10-CM | POA: Diagnosis not present

## 2017-03-01 DIAGNOSIS — D500 Iron deficiency anemia secondary to blood loss (chronic): Secondary | ICD-10-CM | POA: Diagnosis not present

## 2017-03-01 DIAGNOSIS — Z86711 Personal history of pulmonary embolism: Secondary | ICD-10-CM | POA: Diagnosis not present

## 2017-03-01 DIAGNOSIS — E1122 Type 2 diabetes mellitus with diabetic chronic kidney disease: Secondary | ICD-10-CM | POA: Diagnosis not present

## 2017-03-01 DIAGNOSIS — S72141D Displaced intertrochanteric fracture of right femur, subsequent encounter for closed fracture with routine healing: Secondary | ICD-10-CM | POA: Diagnosis not present

## 2017-03-01 DIAGNOSIS — B351 Tinea unguium: Secondary | ICD-10-CM | POA: Diagnosis not present

## 2017-03-01 DIAGNOSIS — M7989 Other specified soft tissue disorders: Secondary | ICD-10-CM | POA: Diagnosis not present

## 2017-03-01 DIAGNOSIS — E1129 Type 2 diabetes mellitus with other diabetic kidney complication: Secondary | ICD-10-CM | POA: Diagnosis not present

## 2017-03-01 DIAGNOSIS — R634 Abnormal weight loss: Secondary | ICD-10-CM | POA: Diagnosis not present

## 2017-03-01 DIAGNOSIS — S82832D Other fracture of upper and lower end of left fibula, subsequent encounter for closed fracture with routine healing: Secondary | ICD-10-CM | POA: Diagnosis not present

## 2017-03-01 DIAGNOSIS — N390 Urinary tract infection, site not specified: Secondary | ICD-10-CM | POA: Diagnosis not present

## 2017-03-01 DIAGNOSIS — S82192S Other fracture of upper end of left tibia, sequela: Secondary | ICD-10-CM | POA: Diagnosis not present

## 2017-03-01 DIAGNOSIS — Z79899 Other long term (current) drug therapy: Secondary | ICD-10-CM | POA: Diagnosis not present

## 2017-03-01 DIAGNOSIS — E1065 Type 1 diabetes mellitus with hyperglycemia: Secondary | ICD-10-CM | POA: Diagnosis not present

## 2017-03-01 DIAGNOSIS — X58XXXD Exposure to other specified factors, subsequent encounter: Secondary | ICD-10-CM | POA: Diagnosis not present

## 2017-03-01 DIAGNOSIS — J690 Pneumonitis due to inhalation of food and vomit: Secondary | ICD-10-CM | POA: Diagnosis not present

## 2017-03-01 DIAGNOSIS — E1149 Type 2 diabetes mellitus with other diabetic neurological complication: Secondary | ICD-10-CM | POA: Diagnosis not present

## 2017-03-01 DIAGNOSIS — E7800 Pure hypercholesterolemia, unspecified: Secondary | ICD-10-CM | POA: Diagnosis not present

## 2017-03-01 DIAGNOSIS — G8929 Other chronic pain: Secondary | ICD-10-CM | POA: Diagnosis not present

## 2017-03-01 DIAGNOSIS — T148XXA Other injury of unspecified body region, initial encounter: Secondary | ICD-10-CM | POA: Diagnosis present

## 2017-03-01 DIAGNOSIS — E559 Vitamin D deficiency, unspecified: Secondary | ICD-10-CM | POA: Diagnosis not present

## 2017-03-01 DIAGNOSIS — R279 Unspecified lack of coordination: Secondary | ICD-10-CM | POA: Diagnosis not present

## 2017-03-01 DIAGNOSIS — R55 Syncope and collapse: Secondary | ICD-10-CM | POA: Diagnosis not present

## 2017-03-01 DIAGNOSIS — S82302A Unspecified fracture of lower end of left tibia, initial encounter for closed fracture: Secondary | ICD-10-CM | POA: Diagnosis not present

## 2017-03-01 DIAGNOSIS — E1142 Type 2 diabetes mellitus with diabetic polyneuropathy: Secondary | ICD-10-CM | POA: Diagnosis not present

## 2017-03-01 DIAGNOSIS — Z833 Family history of diabetes mellitus: Secondary | ICD-10-CM | POA: Diagnosis not present

## 2017-03-01 DIAGNOSIS — R1312 Dysphagia, oropharyngeal phase: Secondary | ICD-10-CM | POA: Diagnosis not present

## 2017-03-01 DIAGNOSIS — R600 Localized edema: Secondary | ICD-10-CM | POA: Diagnosis not present

## 2017-03-01 DIAGNOSIS — Z7982 Long term (current) use of aspirin: Secondary | ICD-10-CM | POA: Diagnosis not present

## 2017-03-01 DIAGNOSIS — I739 Peripheral vascular disease, unspecified: Secondary | ICD-10-CM | POA: Diagnosis not present

## 2017-03-01 DIAGNOSIS — S72142A Displaced intertrochanteric fracture of left femur, initial encounter for closed fracture: Secondary | ICD-10-CM | POA: Diagnosis not present

## 2017-03-01 DIAGNOSIS — G909 Disorder of the autonomic nervous system, unspecified: Secondary | ICD-10-CM | POA: Diagnosis not present

## 2017-03-01 DIAGNOSIS — R339 Retention of urine, unspecified: Secondary | ICD-10-CM | POA: Diagnosis not present

## 2017-03-01 DIAGNOSIS — Z9289 Personal history of other medical treatment: Secondary | ICD-10-CM | POA: Diagnosis not present

## 2017-03-01 DIAGNOSIS — Z86718 Personal history of other venous thrombosis and embolism: Secondary | ICD-10-CM | POA: Diagnosis not present

## 2017-03-01 DIAGNOSIS — S82202D Unspecified fracture of shaft of left tibia, subsequent encounter for closed fracture with routine healing: Secondary | ICD-10-CM | POA: Diagnosis not present

## 2017-03-01 DIAGNOSIS — K3184 Gastroparesis: Secondary | ICD-10-CM | POA: Diagnosis not present

## 2017-03-01 DIAGNOSIS — E876 Hypokalemia: Secondary | ICD-10-CM | POA: Diagnosis not present

## 2017-03-01 DIAGNOSIS — Z9181 History of falling: Secondary | ICD-10-CM | POA: Diagnosis not present

## 2017-03-01 DIAGNOSIS — D649 Anemia, unspecified: Secondary | ICD-10-CM | POA: Diagnosis not present

## 2017-03-01 DIAGNOSIS — A4159 Other Gram-negative sepsis: Secondary | ICD-10-CM

## 2017-03-01 DIAGNOSIS — D6489 Other specified anemias: Secondary | ICD-10-CM | POA: Diagnosis not present

## 2017-03-01 DIAGNOSIS — S8992XS Unspecified injury of left lower leg, sequela: Secondary | ICD-10-CM | POA: Diagnosis present

## 2017-03-01 DIAGNOSIS — J189 Pneumonia, unspecified organism: Secondary | ICD-10-CM | POA: Diagnosis not present

## 2017-03-01 DIAGNOSIS — S82142D Displaced bicondylar fracture of left tibia, subsequent encounter for closed fracture with routine healing: Secondary | ICD-10-CM | POA: Diagnosis not present

## 2017-03-01 DIAGNOSIS — T83511S Infection and inflammatory reaction due to indwelling urethral catheter, sequela: Secondary | ICD-10-CM | POA: Diagnosis not present

## 2017-03-01 DIAGNOSIS — L03116 Cellulitis of left lower limb: Secondary | ICD-10-CM | POA: Diagnosis not present

## 2017-03-01 DIAGNOSIS — I824Y2 Acute embolism and thrombosis of unspecified deep veins of left proximal lower extremity: Secondary | ICD-10-CM | POA: Diagnosis not present

## 2017-03-01 DIAGNOSIS — A414 Sepsis due to anaerobes: Secondary | ICD-10-CM

## 2017-03-01 DIAGNOSIS — R400 Somnolence: Secondary | ICD-10-CM | POA: Diagnosis not present

## 2017-03-01 DIAGNOSIS — E1165 Type 2 diabetes mellitus with hyperglycemia: Secondary | ICD-10-CM | POA: Diagnosis not present

## 2017-03-01 DIAGNOSIS — M79672 Pain in left foot: Secondary | ICD-10-CM | POA: Diagnosis not present

## 2017-03-01 DIAGNOSIS — S82402D Unspecified fracture of shaft of left fibula, subsequent encounter for closed fracture with routine healing: Secondary | ICD-10-CM | POA: Diagnosis not present

## 2017-03-01 DIAGNOSIS — X58XXXA Exposure to other specified factors, initial encounter: Secondary | ICD-10-CM | POA: Diagnosis not present

## 2017-03-01 DIAGNOSIS — N183 Chronic kidney disease, stage 3 (moderate): Secondary | ICD-10-CM | POA: Diagnosis not present

## 2017-03-01 DIAGNOSIS — D72829 Elevated white blood cell count, unspecified: Secondary | ICD-10-CM | POA: Diagnosis not present

## 2017-03-01 LAB — BASIC METABOLIC PANEL
ANION GAP: 14 (ref 5–15)
BUN: 17 mg/dL (ref 6–20)
CALCIUM: 8.1 mg/dL — AB (ref 8.9–10.3)
CO2: 27 mmol/L (ref 22–32)
Chloride: 98 mmol/L — ABNORMAL LOW (ref 101–111)
Creatinine, Ser: 0.88 mg/dL (ref 0.61–1.24)
GLUCOSE: 268 mg/dL — AB (ref 65–99)
POTASSIUM: 4 mmol/L (ref 3.5–5.1)
SODIUM: 139 mmol/L (ref 135–145)

## 2017-03-01 LAB — GLUCOSE, CAPILLARY
GLUCOSE-CAPILLARY: 264 mg/dL — AB (ref 65–99)
Glucose-Capillary: 217 mg/dL — ABNORMAL HIGH (ref 65–99)

## 2017-03-01 LAB — HEMOGLOBIN A1C
Hgb A1c MFr Bld: 7.6 % — ABNORMAL HIGH (ref 4.8–5.6)
Mean Plasma Glucose: 171 mg/dL

## 2017-03-01 LAB — URINE CULTURE: Culture: >=100000 — AB

## 2017-03-01 LAB — MAGNESIUM: MAGNESIUM: 2 mg/dL (ref 1.7–2.4)

## 2017-03-01 MED ORDER — CEFUROXIME AXETIL 250 MG PO TABS
500.0000 mg | ORAL_TABLET | Freq: Two times a day (BID) | ORAL | Status: DC
  Administered 2017-03-01: 500 mg via ORAL
  Filled 2017-03-01: qty 2

## 2017-03-01 MED ORDER — CEFUROXIME AXETIL 500 MG PO TABS: 500.0000 mg | ORAL_TABLET | Freq: Two times a day (BID) | ORAL | 0 refills | Status: DC

## 2017-03-01 NOTE — Progress Notes (Signed)
CSW called [facility] to request when pt could return to the facility. Facility representative states that pt can return any time. RN can call report to [phone]. Pt will be returning to room 133.  Georga Kaufmann, MSW, [place] Covering [phone]

## 2017-03-01 NOTE — Discharge Summary (Addendum)
Physician Discharge Summary  Eduardo Lee QMV:[id number] DOB: 28-Jun-1926 DOA: 02/26/2017  PCP: Mickie Hillier, MD  Admit date: 02/26/2017 Discharge date: 03/01/2017  Admitted From: SNF Disposition:  [facility]  Recommendations for Outpatient Follow-up:  1. Follow up with PCP in 1-2 weeks 2. Please obtain BMP/CBC in one week 3. Please remove foley for voiding trial on 03/07/17   Discharge Condition: Stable CODE STATUS: DNR Diet recommendation: Carb Modified / Dysphagia 2 with thin liquids   Brief/Interim Summary: 83 year old male with a history of diabetes mellitus, hypertension, stroke, DVT/PE presenting from the [facility] increasing confusion. Apparently, the patient had a mechanical fall at her restaurant in [place] resulting in a left distal femur fracture. He was seen at a hospital in [place], but he was not deemed to be a good surgical candidate by orthopedics. A long-leg cast was placed in [place] on 02/17/2017. The patient's family brought the patient back to [place], but noted that he had increasing confusion. He was admitted for 02/19/2017 through 02/20/2017 after which he was discharged to the [facility].Notably, the patient had a recent Foley catheter placed on 02/21/2017 secondary to urinary retention. He failed a voiding trial on 02/25/2017 and Foley catheter had to be placed back. In the past 24-48 hours, the patient's family has noted increasing confusion. As a result, the patient was brought to the hospital for further evaluation  Discharge Diagnoses:  Acute metabolic encephalopathy -Multifactorial including UTI, dehydration, and sedative medications -Continue cefepime pending culture data -Mental status is slightly better on the morning of 02/27/2017-->near baseline on 02/28/17  Sepsis -Patient presented with leukocytosis and elevated lactic acid -Secondary to urinary source -UA with TNTC WBC -Lactic acid peaked at 2.75 -Elevated  coags likely secondary to rivaroxaban  UTI--Klebsiella--associated with foley catheter -continued cefepime pending culture data -d/c with cefuroxime x 7 more days to complete 10 days of therapy  Left proximal tibia fracture -appreciate Ortho consult-->poor surgical candidate -Left leg cast was placed on 02/17/2017 in [place] -Left tib-fib x-ray--comminuted proximal tibia fracture with anterior angulation, lateral tibial plateau fracture, proximal fibular head fracture  Dehydration -Secondary to cellular diuresis from poorly controlled diabetes mellitus -The patient may have had hyperosmolar state on 02/25/2017 with a serum glucose up to 440 -improved with IVF  Urine retention -failed voiding trial on 3/27 -maintained foley during hospitalization -remove foley for voiding trial on 03/07/17  Diabetes mellitus type 2, uncontrolled with gastroparesis -Hemoglobin A1c--pending -Start Lantus 6 units daily--discharge with previous tresiba 5 units daily with humalog 3 units with meals -NovoLog sliding scale  CKD stage 2-3 -Baseline creatinine 1.0-1.3  DVT/PE history -Continue rivaroxaban  Unstageable sacral pressure injury -Present at the time of admission -Appreciate wound care consult  Hypokalemia -repleted -check mag--2.0   Discharge Instructions  Discharge Instructions    Diet Carb Modified    Complete by:  As directed    Increase activity slowly    Complete by:  As directed      Allergies as of 03/01/2017   No Known Allergies     Medication List    STOP taking these medications   VENELEX Oint     TAKE these medications   aspirin 81 MG tablet Take 81 mg by mouth every morning.   cefUROXime 500 MG tablet Commonly known as:  CEFTIN Take 1 tablet (500 mg total) by mouth 2 (two) times daily with a meal.   citalopram 20 MG tablet Commonly known as:  CELEXA Take 1 tablet (  20 mg total) by mouth every morning.   esomeprazole 40 MG  capsule Commonly known as:  NEXIUM Take 40 mg by mouth daily as needed. Reported on 05/10/2016   ezetimibe 10 MG tablet Commonly known as:  ZETIA Take 1 tablet (10 mg total) by mouth at bedtime.   HUMALOG KWIKPEN 100 UNIT/ML KiwkPen Generic drug:  insulin lispro Inject 3 Units into the muscle 3 (three) times daily. Before meals   latanoprost 0.005 % ophthalmic solution Commonly known as:  XALATAN Place 1 drop into both eyes at bedtime.   multivitamin with minerals Tabs tablet Take 1 tablet by mouth daily.   oxyCODONE-acetaminophen 5-325 MG tablet Commonly known as:  PERCOCET/ROXICET Take 1 tablet by mouth every 8 (eight) hours as needed for severe pain.   polyethylene glycol powder powder Commonly known as:  GLYCOLAX/MIRALAX Take 34 g by mouth every morning.   rivaroxaban 20 MG Tabs tablet Commonly known as:  XARELTO Take 1 tablet (20 mg total) by mouth daily with supper.   SANTYL ointment Generic drug:  collagenase Apply to coccyx wound per tx orders   TRESIBA FLEXTOUCH 100 UNIT/ML Sopn FlexTouch Pen Generic drug:  insulin degludec Inject 5 Units into the muscle at bedtime.   vitamin B-12 1000 MCG tablet Commonly known as:  CYANOCOBALAMIN Take 1,000 mcg by mouth daily.   Vitamin D (Ergocalciferol) 50000 units Caps capsule Commonly known as:  DRISDOL Take 50,000 Units by mouth every 7 (seven) days.       No Known Allergies  Consultations:  none   Procedures/Studies: Dg Chest 2 View  Result Date: 02/26/2017 CLINICAL DATA:  Altered mental status EXAM: CHEST  2 VIEW COMPARISON:  October 29, 2011 FINDINGS: There is no edema or consolidation. Heart size and pulmonary vascularity are normal. No adenopathy. There is degenerative change in the lower thoracic region. IMPRESSION: No edema or consolidation. Electronically Signed   By: Lowella Grip III M.D.   On: 02/26/2017 14:40   Ct Head Wo Contrast  Result Date: 02/19/2017 CLINICAL DATA:  Altered mental  status with history of fall EXAM: CT HEAD WITHOUT CONTRAST TECHNIQUE: Contiguous axial images were obtained from the base of the skull through the vertex without intravenous contrast. COMPARISON:  10/29/2011 FINDINGS: Brain: No acute territorial infarction, hemorrhage or mass is visualized. Moderate diffuse atrophy. Mild to moderate periventricular and subcortical white matter small vessel ischemic changes. Old lacunar infarcts within the thalamus and right basal ganglia. Enlarged ventricles are stable in size and felt secondary to atrophy. Vascular: No hyperdense vessels. Carotid artery calcifications and vertebral artery calcifications. Skull: No fracture.  No suspicious bone lesion. Sinuses/Orbits: Mucosal thickening in the ethmoid and maxillary sinuses. Bony remottling of the left maxillary sinus wall consistent with chronic sinusitis. No acute orbital abnormality. Other: None IMPRESSION: 1. No CT evidence for acute intracranial abnormality 2. Atrophy with mild to moderate white matter small vessel ischemic changes. Electronically Signed   By: Donavan Foil M.D.   On: 02/19/2017 16:57   Dg Knee Left Port  Result Date: 02/27/2017 CLINICAL DATA:  Fall, 02/15/2017, left femur fracture EXAM: PORTABLE LEFT KNEE - 1-2 VIEW COMPARISON:  01/09/2016 FINDINGS: Two views of the left knee submitted. Study is markedly limited by casting material artifact. Partially visualized intramedullary rod in distal left femur. There is mild displaced minimal angulated fracture in proximal left tibia. Please note the fracture line extends oblique into the lateral tibial plateau. Minimal displaced impacted fracture proximal left fibula IMPRESSION: Study is markedly limited by  casting material artifact. Partially visualized intramedullary rod in distal left femur. There is mild displaced minimal angulated fracture in proximal left tibia. Please note the fracture line extends oblique into the lateral tibial plateau. Minimal displaced  impacted fracture proximal left fibula. Electronically Signed   By: Lahoma Crocker M.D.   On: 02/27/2017 11:25   Dg Tibia/fibula Left Port  Result Date: 02/27/2017 CLINICAL DATA:  Left lower leg fracture.  Fall 02/15/2017. EXAM: PORTABLE LEFT TIBIA AND FIBULA - 2 VIEW COMPARISON:  Left femur radiographs 01/09/2016 FINDINGS: A comminuted proximal tibia fracture demonstrates anterior angulation of 20 degrees. A lateral tibial plateau fracture is present. A proximal fibular head fracture is again seen. Distal tibia and fibula ORIF is noted. The study is limited by overlying fiberglass cast. An IM rod is again seen within the distal femur. IMPRESSION: 1. Comminuted proximal tibial fracture with anterior angulation of approximately 20 degrees. 2. Lateral tibial plateau fracture. 3. Proximal fibular head fracture. 4. Inter medullary rod of the femur. 5. Previous ORIF of the distal tibia and fibula. Electronically Signed   By: San Morelle M.D.   On: 02/27/2017 15:50   Dg Femur Port Min 2 Views Left  Result Date: 02/27/2017 CLINICAL DATA:  Fall. EXAM: LEFT FEMUR PORTABLE 2 VIEWS COMPARISON:  01/09/2016. FINDINGS: ORIF left femur. Hardware intact. Posttraumatic bony changes are stable. No acute bony abnormality. Peripheral vascular disease . Penile prosthesis. IMPRESSION: 1. ORIF left femur. Hardware intact. Anatomic alignment. Posttraumatic bony changes are stable. No acute bony abnormality identified. 2. Peripheral vascular disease. Electronically Signed   By: Marcello Moores  Register   On: 02/27/2017 11:13         Discharge Exam: Vitals:   02/28/17 2100 03/01/17 0630  BP: 132/61 115/71  Pulse: 87 (!) 110  Resp: 18 18  Temp: 98.7 F (37.1 C) 98.1 F (36.7 C)   Vitals:   02/28/17 0502 02/28/17 1400 02/28/17 2100 03/01/17 0630  BP: (!) 135/50 140/86 132/61 115/71  Pulse: 87 88 87 (!) 110  Resp: 18 18 18 18   Temp: 98.2 F (36.8 C) 98.7 F (37.1 C) 98.7 F (37.1 C) 98.1 F (36.7 C)  TempSrc:  Oral Oral Oral Oral  SpO2: 94% 96% 97% 98%  Weight:      Height:        General: Pt is alert, awake, not in acute distress Cardiovascular: RRR, S1/S2 +, no rubs, no gallops Respiratory: CTA bilaterally, no wheezing, no rhonchi Abdominal: Soft, NT, ND, bowel sounds + Extremities: no edema, no cyanosis   The results of significant diagnostics from this hospitalization (including imaging, microbiology, ancillary and laboratory) are listed below for reference.    Significant Diagnostic Studies: Dg Chest 2 View  Result Date: 02/26/2017 CLINICAL DATA:  Altered mental status EXAM: CHEST  2 VIEW COMPARISON:  October 29, 2011 FINDINGS: There is no edema or consolidation. Heart size and pulmonary vascularity are normal. No adenopathy. There is degenerative change in the lower thoracic region. IMPRESSION: No edema or consolidation. Electronically Signed   By: Lowella Grip III M.D.   On: 02/26/2017 14:40   Ct Head Wo Contrast  Result Date: 02/19/2017 CLINICAL DATA:  Altered mental status with history of fall EXAM: CT HEAD WITHOUT CONTRAST TECHNIQUE: Contiguous axial images were obtained from the base of the skull through the vertex without intravenous contrast. COMPARISON:  10/29/2011 FINDINGS: Brain: No acute territorial infarction, hemorrhage or mass is visualized. Moderate diffuse atrophy. Mild to moderate periventricular and subcortical white matter small vessel  ischemic changes. Old lacunar infarcts within the thalamus and right basal ganglia. Enlarged ventricles are stable in size and felt secondary to atrophy. Vascular: No hyperdense vessels. Carotid artery calcifications and vertebral artery calcifications. Skull: No fracture.  No suspicious bone lesion. Sinuses/Orbits: Mucosal thickening in the ethmoid and maxillary sinuses. Bony remottling of the left maxillary sinus wall consistent with chronic sinusitis. No acute orbital abnormality. Other: None IMPRESSION: 1. No CT evidence for acute  intracranial abnormality 2. Atrophy with mild to moderate white matter small vessel ischemic changes. Electronically Signed   By: Donavan Foil M.D.   On: 02/19/2017 16:57   Dg Knee Left Port  Result Date: 02/27/2017 CLINICAL DATA:  Fall, 02/15/2017, left femur fracture EXAM: PORTABLE LEFT KNEE - 1-2 VIEW COMPARISON:  01/09/2016 FINDINGS: Two views of the left knee submitted. Study is markedly limited by casting material artifact. Partially visualized intramedullary rod in distal left femur. There is mild displaced minimal angulated fracture in proximal left tibia. Please note the fracture line extends oblique into the lateral tibial plateau. Minimal displaced impacted fracture proximal left fibula IMPRESSION: Study is markedly limited by casting material artifact. Partially visualized intramedullary rod in distal left femur. There is mild displaced minimal angulated fracture in proximal left tibia. Please note the fracture line extends oblique into the lateral tibial plateau. Minimal displaced impacted fracture proximal left fibula. Electronically Signed   By: Lahoma Crocker M.D.   On: 02/27/2017 11:25   Dg Tibia/fibula Left Port  Result Date: 02/27/2017 CLINICAL DATA:  Left lower leg fracture.  Fall 02/15/2017. EXAM: PORTABLE LEFT TIBIA AND FIBULA - 2 VIEW COMPARISON:  Left femur radiographs 01/09/2016 FINDINGS: A comminuted proximal tibia fracture demonstrates anterior angulation of 20 degrees. A lateral tibial plateau fracture is present. A proximal fibular head fracture is again seen. Distal tibia and fibula ORIF is noted. The study is limited by overlying fiberglass cast. An IM rod is again seen within the distal femur. IMPRESSION: 1. Comminuted proximal tibial fracture with anterior angulation of approximately 20 degrees. 2. Lateral tibial plateau fracture. 3. Proximal fibular head fracture. 4. Inter medullary rod of the femur. 5. Previous ORIF of the distal tibia and fibula. Electronically Signed   By:  San Morelle M.D.   On: 02/27/2017 15:50   Dg Femur Port Min 2 Views Left  Result Date: 02/27/2017 CLINICAL DATA:  Fall. EXAM: LEFT FEMUR PORTABLE 2 VIEWS COMPARISON:  01/09/2016. FINDINGS: ORIF left femur. Hardware intact. Posttraumatic bony changes are stable. No acute bony abnormality. Peripheral vascular disease . Penile prosthesis. IMPRESSION: 1. ORIF left femur. Hardware intact. Anatomic alignment. Posttraumatic bony changes are stable. No acute bony abnormality identified. 2. Peripheral vascular disease. Electronically Signed   By: Marcello Moores  Register   On: 02/27/2017 11:13     Microbiology: Recent Results (from the past 240 hour(s))  Culture, Urine     Status: Abnormal   Collection Time: 02/20/17 11:50 AM  Result Value Ref Range Status   Specimen Description URINE, CLEAN CATCH  Final   Special Requests NONE  Final   Culture (A)  Final    <10,000 COLONIES/mL INSIGNIFICANT GROWTH Performed at [facility], [address], [place], [place] [address]    Report Status 02/22/2017 FINAL  Final  Culture, Urine     Status: None   Collection Time: 02/21/17  3:00 AM  Result Value Ref Range Status   Specimen Description URINE, RANDOM  Final   Special Requests NONE  Final   Culture  Final    NO GROWTH Performed at [facility], [place] [address], [place], [place] [address]    Report Status 02/22/2017 FINAL  Final  Blood Culture (routine x 2)     Status: None (Preliminary result)   Collection Time: 02/26/17  1:52 PM  Result Value Ref Range Status   Specimen Description BLOOD LEFT ANTECUBITAL  Final   Special Requests BOTTLES DRAWN AEROBIC AND ANAEROBIC 10 CC EACH  Final   Culture NO GROWTH 3 DAYS  Final   Report Status PENDING  Incomplete  Blood Culture (routine x 2)     Status: None (Preliminary result)   Collection Time: 02/26/17  1:58 PM  Result Value Ref Range Status   Specimen Description BLOOD BLOOD LEFT HAND  Final   Special Requests BOTTLES DRAWN AEROBIC ONLY  10 CC  Final   Culture NO GROWTH 3 DAYS  Final   Report Status PENDING  Incomplete  MRSA PCR Screening     Status: None   Collection Time: 02/26/17  7:32 PM  Result Value Ref Range Status   MRSA by PCR NEGATIVE NEGATIVE Final    Comment:        The GeneXpert MRSA Assay (FDA approved for NASAL specimens only), is one component of a comprehensive MRSA colonization surveillance program. It is not intended to diagnose MRSA infection nor to guide or monitor treatment for MRSA infections.   Culture, Urine     Status: Abnormal   Collection Time: 02/26/17  8:11 PM  Result Value Ref Range Status   Specimen Description URINE, RANDOM  Final   Special Requests NONE  Final   Culture >=100,000 COLONIES/mL KLEBSIELLA PNEUMONIAE (A)  Final   Report Status 03/01/2017 FINAL  Final   Organism ID, Bacteria KLEBSIELLA PNEUMONIAE (A)  Final      Susceptibility   Klebsiella pneumoniae - MIC*    AMPICILLIN >=32 RESISTANT Resistant     CEFAZOLIN <=4 SENSITIVE Sensitive     CEFTRIAXONE <=1 SENSITIVE Sensitive     CIPROFLOXACIN <=0.25 SENSITIVE Sensitive     GENTAMICIN <=1 SENSITIVE Sensitive     IMIPENEM <=0.25 SENSITIVE Sensitive     NITROFURANTOIN 256 RESISTANT Resistant     TRIMETH/SULFA <=20 SENSITIVE Sensitive     AMPICILLIN/SULBACTAM >=32 RESISTANT Resistant     PIP/TAZO 16 SENSITIVE Sensitive     Extended ESBL NEGATIVE Sensitive     * >=100,000 COLONIES/mL KLEBSIELLA PNEUMONIAE     Labs: Basic Metabolic Panel:  Recent Labs Lab 02/25/17 0700 02/26/17 1252 02/27/17 0436 02/28/17 0502 03/01/17 0626  NA 136 138 137 139 139  K 4.8 4.1 4.6 3.3* 4.0  CL 96* 98* 97* 102 98*  CO2 22 33* 25 29 27   GLUCOSE 440* 235* 334* 138* 268*  BUN 32* 29* 24* 22* 17  CREATININE 1.30* 1.10 1.14 0.93 0.88  CALCIUM 8.9 8.9 8.3* 8.0* 8.1*  MG  --   --  2.1  --  2.0   Liver Function Tests:  Recent Labs Lab 02/26/17 1252  AST 20  ALT 13*  ALKPHOS 139*  BILITOT 0.8  PROT 5.4*  ALBUMIN 2.7*    No results for input(s): LIPASE, AMYLASE in the last 168 hours. No results for input(s): AMMONIA in the last 168 hours. CBC:  Recent Labs Lab 02/25/17 0700 02/26/17 1252 02/27/17 0436 02/28/17 0502  WBC 8.4 14.1* 11.4* 8.7  NEUTROABS  --  12.0*  --   --   HGB 10.2* 10.7* 9.2* 9.2*  HCT  30.8* 32.1* 27.9* 26.9*  MCV 93.6 93.0 93.9 93.1  PLT 402* 463* 397 409*   Cardiac Enzymes: No results for input(s): CKTOTAL, CKMB, CKMBINDEX, TROPONINI in the last 168 hours. BNP: Invalid input(s): POCBNP CBG:  Recent Labs Lab 02/28/17 0750 02/28/17 1119 02/28/17 1643 02/28/17 2011 03/01/17 0759  GLUCAP 116* 169* 145* 157* 264*    Time coordinating discharge:  Greater than 30 minutes  Signed:  Baylee Campus, DO [facility] Pager: [phone] 03/01/2017, 11:30 AM

## 2017-03-01 NOTE — Progress Notes (Signed)
Pharmacy Antibiotic Note  Eduardo Lee is a 83 y.o. male admitted on 02/26/2017 with urosepsis.  UC with Klebsiella  Plan: Consider change to po antibiotic.  Height: 5\' 8"  (172.7 cm) Weight: 138 lb (62.6 kg) IBW/kg (Calculated) : 68.4  Temp (24hrs), Avg:98.5 F (36.9 C), Min:98.1 F (36.7 C), Max:98.7 F (37.1 C)   Recent Labs Lab 02/25/17 0700 02/26/17 1252 02/26/17 1318 02/26/17 1825 02/26/17 2051 02/27/17 0436 02/28/17 0502 03/01/17 0626  WBC 8.4 14.1*  --   --   --  11.4* 8.7  --   CREATININE 1.30* 1.10  --   --   --  1.14 0.93 0.88  LATICACIDVEN  --   --  2.75* 1.3 1.0  --   --   --     Estimated Creatinine Clearance: 50.4 mL/min (by C-G formula based on SCr of 0.88 mg/dL).    No Known Allergies  Antimicrobials this admission: Vancomycin 3/28 >>3/28 Zosyn 3/28 >>3/28 Cefepime 3/28 >>   Dose adjustments this admission: N/A     Microbiology results: 3/28 BCx: pending 3/28 UCx: Klebsiella  Thank you for allowing pharmacy to be a part of this patient's care.  Beverlee Nims, [facility]  03/01/2017 8:58 AM

## 2017-03-01 NOTE — Progress Notes (Signed)
Patient is to be discharged back to [facility] in stable condition. Patient aware of transfer and report has been called to [employer], a nurse at [facility]. Patient will be transported to [facility] by staff.   Celestia Khat, RN

## 2017-03-02 ENCOUNTER — Inpatient Hospital Stay
Admission: RE | Admit: 2017-03-02 | Discharge: 2017-04-09 | Disposition: A | Discharge status: Other Acute Inpatient Hospital | Payer: Medicare Other | Source: Ambulatory Visit | Attending: Internal Medicine | Admitting: Internal Medicine

## 2017-03-02 DIAGNOSIS — T148XXA Other injury of unspecified body region, initial encounter: Principal | ICD-10-CM

## 2017-03-03 ENCOUNTER — Telehealth: Payer: Self-pay | Admitting: *Deleted

## 2017-03-03 ENCOUNTER — Encounter: Payer: Self-pay | Admitting: Internal Medicine

## 2017-03-03 ENCOUNTER — Other Ambulatory Visit (HOSPITAL_COMMUNITY)
Admission: AD | Admit: 2017-03-03 | Discharge: 2017-03-03 | Disposition: A | Discharge status: Home / Self Care | Payer: Medicare Other | Source: Skilled Nursing Facility | Attending: Internal Medicine | Admitting: Internal Medicine

## 2017-03-03 ENCOUNTER — Non-Acute Institutional Stay (SKILLED_NURSING_FACILITY): Payer: Medicare Other | Admitting: Internal Medicine

## 2017-03-03 DIAGNOSIS — R400 Somnolence: Secondary | ICD-10-CM | POA: Diagnosis not present

## 2017-03-03 DIAGNOSIS — S82102G Unspecified fracture of upper end of left tibia, subsequent encounter for closed fracture with delayed healing: Secondary | ICD-10-CM

## 2017-03-03 DIAGNOSIS — T83511S Infection and inflammatory reaction due to indwelling urethral catheter, sequela: Secondary | ICD-10-CM | POA: Diagnosis not present

## 2017-03-03 DIAGNOSIS — E1065 Type 1 diabetes mellitus with hyperglycemia: Secondary | ICD-10-CM | POA: Diagnosis not present

## 2017-03-03 DIAGNOSIS — R4182 Altered mental status, unspecified: Secondary | ICD-10-CM | POA: Insufficient documentation

## 2017-03-03 DIAGNOSIS — N390 Urinary tract infection, site not specified: Secondary | ICD-10-CM | POA: Diagnosis not present

## 2017-03-03 LAB — CBC WITH DIFFERENTIAL/PLATELET
BASOS ABS: 0 10*3/uL (ref 0.0–0.1)
BASOS PCT: 0 %
EOS ABS: 0.1 10*3/uL (ref 0.0–0.7)
EOS PCT: 1 %
HCT: 29.9 % — ABNORMAL LOW (ref 39.0–52.0)
Hemoglobin: 10 g/dL — ABNORMAL LOW (ref 13.0–17.0)
Lymphocytes Relative: 13 %
Lymphs Abs: 0.9 10*3/uL (ref 0.7–4.0)
MCH: 31.3 pg (ref 26.0–34.0)
MCHC: 33.4 g/dL (ref 30.0–36.0)
MCV: 93.4 fL (ref 78.0–100.0)
MONO ABS: 1.2 10*3/uL — AB (ref 0.1–1.0)
Monocytes Relative: 16 %
Neutro Abs: 4.9 10*3/uL (ref 1.7–7.7)
Neutrophils Relative %: 70 %
Platelets: 539 10*3/uL — ABNORMAL HIGH (ref 150–400)
RBC: 3.2 MIL/uL — AB (ref 4.22–5.81)
RDW: 16 % — ABNORMAL HIGH (ref 11.5–15.5)
WBC: 7.1 10*3/uL (ref 4.0–10.5)

## 2017-03-03 LAB — COMPREHENSIVE METABOLIC PANEL
ALBUMIN: 2.3 g/dL — AB (ref 3.5–5.0)
ALT: 13 U/L — AB (ref 17–63)
AST: 17 U/L (ref 15–41)
Alkaline Phosphatase: 196 U/L — ABNORMAL HIGH (ref 38–126)
Anion gap: 11 (ref 5–15)
BUN: 26 mg/dL — AB (ref 6–20)
CHLORIDE: 95 mmol/L — AB (ref 101–111)
CO2: 28 mmol/L (ref 22–32)
CREATININE: 1.27 mg/dL — AB (ref 0.61–1.24)
Calcium: 8.2 mg/dL — ABNORMAL LOW (ref 8.9–10.3)
GFR calc Af Amer: 56 mL/min — ABNORMAL LOW (ref >60)
GFR calc non Af Amer: 48 mL/min — ABNORMAL LOW (ref >60)
GLUCOSE: 276 mg/dL — AB (ref 65–99)
Potassium: 3.7 mmol/L (ref 3.5–5.1)
SODIUM: 134 mmol/L — AB (ref 135–145)
Total Bilirubin: 1.9 mg/dL — ABNORMAL HIGH (ref 0.3–1.2)
Total Protein: 4.8 g/dL — ABNORMAL LOW (ref 6.5–8.1)

## 2017-03-03 LAB — CULTURE, BLOOD (ROUTINE X 2)
Culture: NO GROWTH
Culture: NO GROWTH

## 2017-03-03 LAB — PROTIME-INR
INR: 2.28
Prothrombin Time: 25.5 seconds — ABNORMAL HIGH (ref 11.4–15.2)

## 2017-03-03 NOTE — Progress Notes (Addendum)
Provider: Veleta Miners  Location:   [place] Room Number: 133/P Place of Service:  SNF (31)  PCP: Mickie Hillier, MD Patient Care Team: Mikey Kirschner, MD as PCP - General  Extended Emergency Contact Information Primary Emergency Contact: Faniel,Mike Address: [address] [place] [address] [place] Phone: [phone] Mobile Phone: [phone] Relation: Son Secondary Emergency Contact: Livia Snellen, [place] [address] [place]itter of [place] Phone: [phone] Relation: Son  Code Status: DNR Goals of Care: Advanced Directive information Advanced Directives 03/03/2017  Does Patient Have a Medical Advance Directive? Yes  Type of Advance Directive Out of facility DNR (pink MOST or yellow form)  Does patient want to make changes to medical advance directive? No - Patient declined  Copy of [place] in Chart? -  Pre-existing out of facility DNR order (yellow form or pink MOST form) -      Chief Complaint  Patient presents with  . Readmit To SNF    HPI: Patient is a 83 y.o. male seen today for admission to [facility] for therapy.  Patient has h/o IDDM With Neuropathy,followed by Dr Altheimer, Chronic Kidney disaese Stage 3 , Hyperlipidemia, Vit D deficiency, Gastroparesis, S/P Hip fracture in 11/12 and 2014 with osteoporosis treated before, DVT with PE in 2012 on chronic Anticoagulation, Diabetic retinopathy.  With recent Proximal Left  Proximal fracture of Tibia after Mechanical Fall.  After his Fall patient was not deemed Surgical candidate and was put in the long leg cast he was in the facility when was noticed to have altered Mental status. He was send to be evaluate in the ED. He was admitted and found to have UTI which was treated with Antibiotics. His Mental status improved and was near Baseline on discharge.  He was also seen by Dr Aline Brochure for Fracture and he does not think patient  Surgical candidate and will only benefit with Long leg Cast.   Patient also had Urinary retention and has foley cathter for now.  He also has Unstageable Sacral Pressure injury.  Patient was doing well initially but since this morning he is more somnolent agin per nurses and his sitter. He will wake up but then wants to be left alone and says he wants to sleep and is very weak. Has cough with Sputum. No chest pain or SOB. No fever or chills. Appetite is less. No nausea or vomiting.   Past Medical History:  Diagnosis Date  . Diabetes mellitus   . DVT (deep venous thrombosis) ([place])   . Gastroparesis   . Hypertension   . Legally blind    diabetic retinopathy  . Neuropathy ([place])   . Osteoporosis   . Pulmonary embolism ([place]) 12/2010   on blood thinners  . Stroke ([place])   . Weight loss    Past Surgical History:  Procedure Laterality Date  . ANKLE SURGERY     Patient states that he had pins place in the left foot  . BACK SURGERY  2008  . CARPAL TUNNEL RELEASE  08/2011   Left hand  . COLONOSCOPY  01/2011  . ORIF HIP FRACTURE  10/31/2011   Procedure: OPEN REDUCTION INTERNAL FIXATION HIP;  Surgeon: Arther Abbott, MD;  Location: [facility];  Service: Orthopedics;  Laterality: Right;  with Gamma Nail  . TRANSURETHRAL RESECTION OF PROSTATE  1984  . UPPER GASTROINTESTINAL ENDOSCOPY  01/2011    reports that he quit smoking about 35 years ago. His smoking use included Cigars. He quit after 20.00 years of use. He has never used smokeless tobacco. He reports that he does not drink alcohol or use drugs. Social History   Social History  . Marital status: Divorced    Spouse name: N/A  . Number of children: N/A  . Years of education: N/A   Occupational History  . Not on file.   Social History Main Topics  . Smoking status: Former Smoker    Years: 20.00    Types: Cigars    Quit date: 10/27/1981  . Smokeless tobacco: Never Used  . Alcohol use No     Comment: Drinks Scotch 3 times per  week prior to [employer]- hasn't drank x 1 month  . Drug use: No  . Sexual activity: No   Other Topics Concern  . Not on file   Social History Narrative  . No narrative on file    Functional Status Survey:    Family History  Problem Relation Age of Onset  . Diabetes Mother   . Diabetes Father   . Diabetes Sister   . Diabetes Brother   . Diabetes Sister   . Diabetes Son   . Healthy Son   . Healthy Son     Health Maintenance  Topic Date Due  . URINE MICROALBUMIN  02/20/1936  . OPHTHALMOLOGY EXAM  06/12/2016  . FOOT EXAM  04/08/2017  . INFLUENZA VACCINE  07/02/2017  . HEMOGLOBIN A1C  08/31/2017  . TETANUS/TDAP  04/26/2026  . PNA vac Low Risk Adult  Completed    No Known Allergies  Allergies as of 03/03/2017   No Known Allergies     Medication List       Accurate as of 03/03/17 12:02 PM. Always use your most recent med list.          aspirin 81 MG tablet Take 81 mg by mouth every morning.   cefUROXime 500 MG tablet Commonly known as:  CEFTIN Take 1 tablet (500 mg total) by mouth 2 (two) times daily with a meal.   citalopram 20 MG tablet Commonly known as:  CELEXA Take 1 tablet (20 mg total) by mouth every morning.   esomeprazole 40 MG capsule Commonly known as:  NEXIUM Take 40 mg by mouth daily as needed. Reported on 05/10/2016   ezetimibe 10 MG tablet Commonly known as:  ZETIA Take 1 tablet (10 mg total) by mouth at bedtime.   HUMALOG KWIKPEN 100 UNIT/ML KiwkPen Generic drug:  insulin lispro Inject 3 Units into the muscle 3 (three) times daily. Before meals   latanoprost 0.005 % ophthalmic solution Commonly known as:  XALATAN Place 1 drop into both eyes at bedtime.   multivitamin with minerals Tabs tablet Take 1 tablet by mouth daily.   oxyCODONE-acetaminophen 5-325 MG tablet Commonly known as:  PERCOCET/ROXICET Take 1 tablet by mouth every 8 (eight) hours as needed for severe pain.   polyethylene glycol powder powder Commonly known as:   GLYCOLAX/MIRALAX Take 34 g by mouth every morning.   rivaroxaban 20 MG Tabs tablet Commonly known as:  XARELTO Take 1 tablet (20 mg total) by mouth daily with supper.   saccharomyces boulardii 250 MG capsule Commonly known as:  FLORASTOR Take 250 mg by mouth 2 (two) times daily. Until 03/10/17   SANTYL ointment Generic drug:  collagenase Apply to coccyx wound per tx orders   TRESIBA FLEXTOUCH 100 UNIT/ML Sopn  FlexTouch Pen Generic drug:  insulin degludec Inject 5 Units into the muscle at bedtime.   VENELEX Oint Apply to right buttocks and sacrum for prevention every shift   vitamin B-12 1000 MCG tablet Commonly known as:  CYANOCOBALAMIN Take 1,000 mcg by mouth daily.   Vitamin D (Ergocalciferol) 50000 units Caps capsule Commonly known as:  DRISDOL Take 50,000 Units by mouth every 7 (seven) days.       Review of Systems  Constitutional: Positive for activity change and appetite change. Negative for chills, diaphoresis and fever.  HENT: Negative.   Respiratory: Positive for cough. Negative for apnea, chest tightness, shortness of breath and wheezing.   Cardiovascular: Negative.   Gastrointestinal: Negative.   Genitourinary: Negative.   Musculoskeletal: Negative.   Skin: Positive for wound.  Neurological: Positive for weakness. Negative for dizziness, speech difficulty and light-headedness.  Psychiatric/Behavioral: Positive for confusion. Negative for hallucinations and sleep disturbance. The patient is not nervous/anxious.     Vitals:   03/03/17 1142  BP: 107/61  Pulse: (!) 104  Resp: 20  Temp: 97.7 F (36.5 C)  TempSrc: Oral   There is no height or weight on file to calculate BMI. Physical Exam  Constitutional: He appears well-developed and well-nourished.  HENT:  Head: Normocephalic.  Mouth/Throat: Oropharynx is clear and moist.  Eyes: Pupils are equal, round, and reactive to light.  Neck: Neck supple.  Cardiovascular: Normal rate, regular rhythm and  normal heart sounds.   Pulmonary/Chest: Effort normal. He has rales.  B/L Rales  Abdominal: Soft. Bowel sounds are normal. He exhibits no distension. There is no tenderness. There is no rebound.  Musculoskeletal: He exhibits no edema.  Neurological: He is alert.  Patient very Sleepy. Would wake up but then go to sleep again. And then would not follow any commands. Would not answer my questions.  Skin: Skin is warm and dry.  Psychiatric:  Unable to asseses    Labs reviewed: Basic Metabolic Panel:  Recent Labs  02/27/17 0436 02/28/17 0502 03/01/17 0626  NA 137 139 139  K 4.6 3.3* 4.0  CL 97* 102 98*  CO2 25 29 27   GLUCOSE 334* 138* 268*  BUN 24* 22* 17  CREATININE 1.14 0.93 0.88  CALCIUM 8.3* 8.0* 8.1*  MG 2.1  --  2.0   Liver Function Tests:  Recent Labs  05/07/16 0949 02/19/17 1541 02/26/17 1252  AST 17 22 20   ALT 12 16* 13*  ALKPHOS 171* 93 139*  BILITOT 0.4 1.2 0.8  PROT 6.5 6.3* 5.4*  ALBUMIN 3.8 3.3* 2.7*   No results for input(s): LIPASE, AMYLASE in the last 8760 hours. No results for input(s): AMMONIA in the last 8760 hours. CBC:  Recent Labs  02/21/17 1411 02/22/17 0730  02/26/17 1252 02/27/17 0436 02/28/17 0502  WBC 8.1 9.9  < > 14.1* 11.4* 8.7  NEUTROABS 6.7 8.4*  --  12.0*  --   --   HGB 9.2* 10.0*  < > 10.7* 9.2* 9.2*  HCT 27.3* 30.4*  < > 32.1* 27.9* 26.9*  MCV 91.6 94.7  < > 93.0 93.9 93.1  PLT 317 321  < > 463* 397 409*  < > = values in this interval not displayed. Cardiac Enzymes: No results for input(s): CKTOTAL, CKMB, CKMBINDEX, TROPONINI in the last 8760 hours. BNP: Invalid input(s): POCBNP Lab Results  Component Value Date   HGBA1C 7.6 (H) 02/28/2017   No results found for: TSH Lab Results  Component Value Date   VITAMINB12 382  12/05/2010   No results found for: FOLATE No results found for: IRON, TIBC, FERRITIN  Imaging and Procedures obtained prior to SNF admission: Dg Chest 2 View  Result Date: 02/26/2017 CLINICAL  DATA:  Altered mental status EXAM: CHEST  2 VIEW COMPARISON:  October 29, 2011 FINDINGS: There is no edema or consolidation. Heart size and pulmonary vascularity are normal. No adenopathy. There is degenerative change in the lower thoracic region. IMPRESSION: No edema or consolidation. Electronically Signed   By: Lowella Grip III M.D.   On: 02/26/2017 14:40   Dg Knee Left Port  Result Date: 02/27/2017 CLINICAL DATA:  Fall, 02/15/2017, left femur fracture EXAM: PORTABLE LEFT KNEE - 1-2 VIEW COMPARISON:  01/09/2016 FINDINGS: Two views of the left knee submitted. Study is markedly limited by casting material artifact. Partially visualized intramedullary rod in distal left femur. There is mild displaced minimal angulated fracture in proximal left tibia. Please note the fracture line extends oblique into the lateral tibial plateau. Minimal displaced impacted fracture proximal left fibula IMPRESSION: Study is markedly limited by casting material artifact. Partially visualized intramedullary rod in distal left femur. There is mild displaced minimal angulated fracture in proximal left tibia. Please note the fracture line extends oblique into the lateral tibial plateau. Minimal displaced impacted fracture proximal left fibula. Electronically Signed   By: Lahoma Crocker M.D.   On: 02/27/2017 11:25   Dg Tibia/fibula Left Port  Result Date: 02/27/2017 CLINICAL DATA:  Left lower leg fracture.  Fall 02/15/2017. EXAM: PORTABLE LEFT TIBIA AND FIBULA - 2 VIEW COMPARISON:  Left femur radiographs 01/09/2016 FINDINGS: A comminuted proximal tibia fracture demonstrates anterior angulation of 20 degrees. A lateral tibial plateau fracture is present. A proximal fibular head fracture is again seen. Distal tibia and fibula ORIF is noted. The study is limited by overlying fiberglass cast. An IM rod is again seen within the distal femur. IMPRESSION: 1. Comminuted proximal tibial fracture with anterior angulation of approximately 20  degrees. 2. Lateral tibial plateau fracture. 3. Proximal fibular head fracture. 4. Inter medullary rod of the femur. 5. Previous ORIF of the distal tibia and fibula. Electronically Signed   By: San Morelle M.D.   On: 02/27/2017 15:50   Dg Femur Port Min 2 Views Left  Result Date: 02/27/2017 CLINICAL DATA:  Fall. EXAM: LEFT FEMUR PORTABLE 2 VIEWS COMPARISON:  01/09/2016. FINDINGS: ORIF left femur. Hardware intact. Posttraumatic bony changes are stable. No acute bony abnormality. Peripheral vascular disease . Penile prosthesis. IMPRESSION: 1. ORIF left femur. Hardware intact. Anatomic alignment. Posttraumatic bony changes are stable. No acute bony abnormality identified. 2. Peripheral vascular disease. Electronically Signed   By: [place]   On: 02/27/2017 11:13    Assessment/Plan  Altered Mental status.  It was thought to be multifactorial. Combination of Percocet and UTI. Patient is Somnolent again today. Will Repeat labs with CBC with Diff, CMP  Chest Xray to R/o Pneumonia Also talked to Speech therapy and is not Aspiration risk. Discontinue Percocet  Proximal Tibia and fibula fracture Per ortho he is suppose to stay in the Long leg Cast Follow up with ortho in few weeks.  Diabetes Mellitus BS running more then 300 Will increase his Treshiba to 8 units and start on sliding scale for coverage.  Urinary Retension with UTI    Patient has foley Cathter again. He is on Cefuroxime Will try to remove foley after he finishes Antibiotics. Also will consider Flomax .  H/o DVT and PE On Chronic Xarelto.  Chronic  renal Insufficiency Creat was slightly elevated in the hospital and he was given IV fluids. Will repeat Labs  Family/ staff Communication:   Labs/tests ordered: Stat Labs CBC with Diff, CMP, PT/INR Stat Chest Xray.  Pateint labs are back and he is mildly dehydrated. WBC is normal. But Total protein is low. Reevaluated patient again and he is still in bed more  awake now. Says he wants to be left alone. His Son is there in the room and he thinks patient has given up. D/W Hospice option with him but he says he has to talk to the other family.I have told them that his prognosis is poor with failure to thrive , Malnutrition and Worsening pressure ulcers. Will re discuss with family tomorrow.

## 2017-03-03 NOTE — Telephone Encounter (Signed)
Per Dr Richardson Landry :  Call pt, see how he is foing, document, rec o v with me latter part of the week, we will do hgb then plus f/ u visit

## 2017-03-03 NOTE — Telephone Encounter (Signed)
Left message return call 03/03/2017 

## 2017-03-04 ENCOUNTER — Non-Acute Institutional Stay (SKILLED_NURSING_FACILITY): Payer: Medicare Other | Admitting: Internal Medicine

## 2017-03-04 ENCOUNTER — Encounter: Payer: Self-pay | Admitting: Internal Medicine

## 2017-03-04 DIAGNOSIS — N390 Urinary tract infection, site not specified: Secondary | ICD-10-CM

## 2017-03-04 DIAGNOSIS — T83511S Infection and inflammatory reaction due to indwelling urethral catheter, sequela: Secondary | ICD-10-CM | POA: Diagnosis not present

## 2017-03-04 DIAGNOSIS — E1065 Type 1 diabetes mellitus with hyperglycemia: Secondary | ICD-10-CM | POA: Diagnosis not present

## 2017-03-04 DIAGNOSIS — J690 Pneumonitis due to inhalation of food and vomit: Secondary | ICD-10-CM | POA: Diagnosis not present

## 2017-03-04 NOTE — Progress Notes (Signed)
Location:   [facility] Room Number: 133/P Place of Service:  SNF [phone]) Provider:  Sherlyn Hay, MD  Patient Care Team: Mikey Kirschner, MD as PCP - General  Extended Emergency Contact Information Primary Emergency Contact: Sliwinski,Mike Address: [address], [place] [address] [place] Phone: [phone] Mobile Phone: [phone] Relation: Son Secondary Emergency Contact: Livia Snellen, [place] [address] [place] of [place] Phone: [phone] Relation: Son  Code Status:  DNR Goals of care: Advanced Directive information Advanced Directives 03/04/2017  Does Patient Have a Medical Advance Directive? Yes  Type of Advance Directive Out of facility DNR (pink MOST or yellow form)  Does patient want to make changes to medical advance directive? No - Patient declined  Copy of [place] in Chart? -  Pre-existing out of facility DNR order (yellow form or pink MOST form) -     Chief Complaint  Patient presents with  . Acute Visit    F/U Chest X-Ray     HPI:  Pt is a 83 y.o. male seen today for an acute visit for Follow up on his chest Xray.  Patient with Recent Proximal Left Fracture of Tibia in Long leg cast. He was seen yesterday for readmission. But was very somnolent. Further work up was done to evaluate increased somnolence. His CBC did not show any increased white count. BMP showed Milidily elevated BUN and creat most likely due to decreased oral intake. Chest Xray was positive for Bilateral infiltrate.vs atelectasis. With Suspicious of aspiration pneumonia he was started on Augmentin last night. Today patient seems more awake. Sitting in the [place] chair. He is still not eating well per her Sitter. He says' I choke when I eat.' C/O Cough but no SOB, Chest pain, Fever or chills.   Past Medical History:  Diagnosis Date  . Diabetes mellitus   . DVT (deep venous  thrombosis) ([facility])   . Gastroparesis   . Hypertension   . Legally blind    diabetic retinopathy  . Neuropathy ([facility])   . Osteoporosis   . Pulmonary embolism ([facility]) 12/2010   on blood thinners  . Stroke ([facility])   . Weight loss    Past Surgical History:  Procedure Laterality Date  . ANKLE SURGERY     Patient states that he had pins place in the left foot  . BACK SURGERY  2008  . CARPAL TUNNEL RELEASE  08/2011   Left hand  . COLONOSCOPY  01/2011  . ORIF HIP FRACTURE  10/31/2011   Procedure: OPEN REDUCTION INTERNAL FIXATION HIP;  Surgeon: Arther Abbott, MD;  Location: [facility];  Service: Orthopedics;  Laterality: Right;  with Gamma Nail  . TRANSURETHRAL RESECTION OF PROSTATE  1984  . UPPER GASTROINTESTINAL ENDOSCOPY  01/2011    No Known Allergies  Allergies as of 03/04/2017   No Known Allergies     Medication List       Accurate as of 03/04/17  1:58 PM. Always use your most recent med list.          amoxicillin-clavulanate 500-125 MG tablet Commonly known as:  AUGMENTIN Take 1 tablet by mouth 3 (three) times daily. Until 03/13/17   aspirin 81 MG tablet Take 81 mg by mouth every morning.   cefUROXime 500 MG tablet Commonly known as:  CEFTIN Take 1 tablet (500 mg total)  by mouth 2 (two) times daily with a meal.   citalopram 20 MG tablet Commonly known as:  CELEXA Take 1 tablet (20 mg total) by mouth every morning.   esomeprazole 40 MG capsule Commonly known as:  NEXIUM Take 40 mg by mouth daily as needed. Reported on 05/10/2016   ezetimibe 10 MG tablet Commonly known as:  ZETIA Take 1 tablet (10 mg total) by mouth at bedtime.   latanoprost 0.005 % ophthalmic solution Commonly known as:  XALATAN Place 1 drop into both eyes at bedtime.   multivitamin with minerals Tabs tablet Take 1 tablet by mouth daily.   NOVOLOG FLEXPEN 100 UNIT/ML FlexPen Generic drug:  insulin aspart Give per sliding scale   polyethylene glycol powder powder Commonly known as:   GLYCOLAX/MIRALAX Take 34 g by mouth every morning.   rivaroxaban 20 MG Tabs tablet Commonly known as:  XARELTO Take 1 tablet (20 mg total) by mouth daily with supper.   saccharomyces boulardii 250 MG capsule Commonly known as:  FLORASTOR Take 250 mg by mouth 2 (two) times daily. Until 03/10/17   SANTYL ointment Generic drug:  collagenase Apply to coccyx wound per tx orders   TRESIBA FLEXTOUCH 100 UNIT/ML Sopn FlexTouch Pen Generic drug:  insulin degludec Inject 8 Units into the muscle at bedtime.   VENELEX Oint Apply to right buttocks and sacrum for prevention every shift   vitamin B-12 1000 MCG tablet Commonly known as:  CYANOCOBALAMIN Take 1,000 mcg by mouth daily.   Vitamin D (Ergocalciferol) 50000 units Caps capsule Commonly known as:  DRISDOL Take 50,000 Units by mouth every 7 (seven) days.       Review of Systems  Constitutional: Positive for activity change and appetite change. Negative for chills, fatigue and fever.  HENT: Positive for trouble swallowing.   Respiratory: Positive for cough and choking. Negative for apnea, chest tightness, shortness of breath and wheezing.   Cardiovascular: Negative.   Gastrointestinal: Negative.   Genitourinary: Negative.   Musculoskeletal: Positive for back pain.  Neurological: Negative.   Psychiatric/Behavioral: Negative.         Immunization History  Administered Date(s) Administered  . Influenza Split 08/31/2013  . Influenza,inj,Quad PF,36+ Mos 09/07/2014, 09/12/2015  . Influenza-Unspecified 09/02/2011, 10/02/2016  . Pneumococcal Conjugate-13 06/07/2014  . Pneumococcal Polysaccharide-23 09/01/2001  . Td 06/01/2009  . Tdap 04/26/2016  . Zoster 05/23/2009   Pertinent  Health Maintenance Due  Topic Date Due  . URINE MICROALBUMIN  02/20/1936  . OPHTHALMOLOGY EXAM  06/12/2016  . FOOT EXAM  04/08/2017  . INFLUENZA VACCINE  07/02/2017  . HEMOGLOBIN A1C  08/31/2017  . PNA vac Low Risk Adult  Completed   Fall Risk   06/10/2016  Falls in the past year? Yes  Number falls in past yr: 1  Injury with Fall? Yes  Risk Factor Category  High Fall Risk  Risk for fall due to : Impaired vision  Follow up Falls evaluation completed;Education provided;Falls prevention discussed   Functional Status Survey:    Vitals:   03/04/17 1357  BP: 121/65  Pulse: 90  Resp: 20  Temp: 97.9 F (36.6 C)  TempSrc: Oral   There is no height or weight on file to calculate BMI. Physical Exam  Constitutional: He is oriented to person, place, and time. He appears well-developed and well-nourished.  HENT:  Head: Normocephalic.  Mouth/Throat: Oropharynx is clear and moist.  Eyes: Pupils are equal, round, and reactive to light.  Cardiovascular: Normal rate, regular rhythm and normal heart  sounds.   Pulmonary/Chest: Effort normal. No respiratory distress. He has no wheezes.  Bilateral Rales.   Abdominal: Soft. Bowel sounds are normal. He exhibits no distension. There is no tenderness.  Musculoskeletal: He exhibits no edema.  Neurological: He is alert and oriented to person, place, and time.    Labs reviewed:  Recent Labs  02/27/17 0436 02/28/17 0502 03/01/17 0626 03/03/17 1400  NA 137 139 139 134*  K 4.6 3.3* 4.0 3.7  CL 97* 102 98* 95*  CO2 25 29 27 28   GLUCOSE 334* 138* 268* 276*  BUN 24* 22* 17 26*  CREATININE 1.14 0.93 0.88 1.27*  CALCIUM 8.3* 8.0* 8.1* 8.2*  MG 2.1  --  2.0  --     Recent Labs  02/19/17 1541 02/26/17 1252 03/03/17 1400  AST 22 20 17   ALT 16* 13* 13*  ALKPHOS 93 139* 196*  BILITOT 1.2 0.8 1.9*  PROT 6.3* 5.4* 4.8*  ALBUMIN 3.3* 2.7* 2.3*    Recent Labs  02/22/17 0730  02/26/17 1252 02/27/17 0436 02/28/17 0502 03/03/17 1400  WBC 9.9  < > 14.1* 11.4* 8.7 7.1  NEUTROABS 8.4*  --  12.0*  --   --  4.9  HGB 10.0*  < > 10.7* 9.2* 9.2* 10.0*  HCT 30.4*  < > 32.1* 27.9* 26.9* 29.9*  MCV 94.7  < > 93.0 93.9 93.1 93.4  PLT 321  < > 463* 397 409* 539*  < > = values in this  interval not displayed. No results found for: TSH Lab Results  Component Value Date   HGBA1C 7.6 (H) 02/28/2017   Lab Results  Component Value Date   CHOL 208 (H) 05/07/2016   HDL 72 05/07/2016   LDLCALC 119 (H) 05/07/2016   TRIG 86 05/07/2016   CHOLHDL 2.9 05/07/2016    Significant Diagnostic Results in last 30 days:  Dg Chest 2 View  Result Date: 02/26/2017 CLINICAL DATA:  Altered mental status EXAM: CHEST  2 VIEW COMPARISON:  October 29, 2011 FINDINGS: There is no edema or consolidation. Heart size and pulmonary vascularity are normal. No adenopathy. There is degenerative change in the lower thoracic region. IMPRESSION: No edema or consolidation. Electronically Signed   By: Lowella Grip III M.D.   On: 02/26/2017 14:40   Ct Head Wo Contrast  Result Date: 02/19/2017 CLINICAL DATA:  Altered mental status with history of fall EXAM: CT HEAD WITHOUT CONTRAST TECHNIQUE: Contiguous axial images were obtained from the base of the skull through the vertex without intravenous contrast. COMPARISON:  10/29/2011 FINDINGS: Brain: No acute territorial infarction, hemorrhage or mass is visualized. Moderate diffuse atrophy. Mild to moderate periventricular and subcortical white matter small vessel ischemic changes. Old lacunar infarcts within the thalamus and right basal ganglia. Enlarged ventricles are stable in size and felt secondary to atrophy. Vascular: No hyperdense vessels. Carotid artery calcifications and vertebral artery calcifications. Skull: No fracture.  No suspicious bone lesion. Sinuses/Orbits: Mucosal thickening in the ethmoid and maxillary sinuses. Bony remottling of the left maxillary sinus wall consistent with chronic sinusitis. No acute orbital abnormality. Other: None IMPRESSION: 1. No CT evidence for acute intracranial abnormality 2. Atrophy with mild to moderate white matter small vessel ischemic changes. Electronically Signed   By: Donavan Foil M.D.   On: 02/19/2017 16:57   Dg  Knee Left Port  Result Date: 02/27/2017 CLINICAL DATA:  Fall, 02/15/2017, left femur fracture EXAM: PORTABLE LEFT KNEE - 1-2 VIEW COMPARISON:  01/09/2016 FINDINGS: Two views of the left  knee submitted. Study is markedly limited by casting material artifact. Partially visualized intramedullary rod in distal left femur. There is mild displaced minimal angulated fracture in proximal left tibia. Please note the fracture line extends oblique into the lateral tibial plateau. Minimal displaced impacted fracture proximal left fibula IMPRESSION: Study is markedly limited by casting material artifact. Partially visualized intramedullary rod in distal left femur. There is mild displaced minimal angulated fracture in proximal left tibia. Please note the fracture line extends oblique into the lateral tibial plateau. Minimal displaced impacted fracture proximal left fibula. Electronically Signed   By: Lahoma Crocker M.D.   On: 02/27/2017 11:25   Dg Tibia/fibula Left Port  Result Date: 02/27/2017 CLINICAL DATA:  Left lower leg fracture.  Fall 02/15/2017. EXAM: PORTABLE LEFT TIBIA AND FIBULA - 2 VIEW COMPARISON:  Left femur radiographs 01/09/2016 FINDINGS: A comminuted proximal tibia fracture demonstrates anterior angulation of 20 degrees. A lateral tibial plateau fracture is present. A proximal fibular head fracture is again seen. Distal tibia and fibula ORIF is noted. The study is limited by overlying fiberglass cast. An IM rod is again seen within the distal femur. IMPRESSION: 1. Comminuted proximal tibial fracture with anterior angulation of approximately 20 degrees. 2. Lateral tibial plateau fracture. 3. Proximal fibular head fracture. 4. Inter medullary rod of the femur. 5. Previous ORIF of the distal tibia and fibula. Electronically Signed   By: San Morelle M.D.   On: 02/27/2017 15:50   Dg Femur Port Min 2 Views Left  Result Date: 02/27/2017 CLINICAL DATA:  Fall. EXAM: LEFT FEMUR PORTABLE 2 VIEWS COMPARISON:   01/09/2016. FINDINGS: ORIF left femur. Hardware intact. Posttraumatic bony changes are stable. No acute bony abnormality. Peripheral vascular disease . Penile prosthesis. IMPRESSION: 1. ORIF left femur. Hardware intact. Anatomic alignment. Posttraumatic bony changes are stable. No acute bony abnormality identified. 2. Peripheral vascular disease. Electronically Signed   By: Marcello Moores  Register   On: 02/27/2017 11:13    Assessment/Plan  Aspiration pneumonia  Pateint was started on Augmentin.D/W family and  sitter to be careful when feeding the patient. Also d/w the Speech therapist. She will reassess the patient again for Dysphagia. According to her he had done well  before on assessment Will also plan to get modified barium swallow..  Urinary tract infection associated with indwelling urethral catheter He is still on Cefuroxime as his Bacteria is resistant to Augmentin. But he just will have few days of overlap.  Diabetes mellitus Patient actually doing well with his sugars since his Tyler Aas was increased to 8 units.  Altered mental status His Percocet was dced and since he had started on Augmentin he is much Alert. Will continue to follow D/W Family again today about his Poor Prognosis.     Family/ staff Communication:   Labs/tests ordered:

## 2017-03-05 ENCOUNTER — Other Ambulatory Visit: Payer: Self-pay | Admitting: *Deleted

## 2017-03-05 ENCOUNTER — Encounter (HOSPITAL_COMMUNITY)
Admission: RE | Admit: 2017-03-05 | Discharge: 2017-03-05 | Disposition: A | Discharge status: Home / Self Care | Payer: Medicare Other | Source: Skilled Nursing Facility | Attending: Internal Medicine | Admitting: Internal Medicine

## 2017-03-05 DIAGNOSIS — E1165 Type 2 diabetes mellitus with hyperglycemia: Secondary | ICD-10-CM | POA: Insufficient documentation

## 2017-03-05 DIAGNOSIS — D6489 Other specified anemias: Secondary | ICD-10-CM | POA: Insufficient documentation

## 2017-03-05 LAB — CBC WITH DIFFERENTIAL/PLATELET
BASOS PCT: 0 %
Basophils Absolute: 0 10*3/uL (ref 0.0–0.1)
EOS PCT: 2 %
Eosinophils Absolute: 0.2 10*3/uL (ref 0.0–0.7)
HEMATOCRIT: 35.9 % — AB (ref 39.0–52.0)
Hemoglobin: 12 g/dL — ABNORMAL LOW (ref 13.0–17.0)
Lymphocytes Relative: 13 %
Lymphs Abs: 1 10*3/uL (ref 0.7–4.0)
MCH: 31.3 pg (ref 26.0–34.0)
MCHC: 33.4 g/dL (ref 30.0–36.0)
MCV: 93.5 fL (ref 78.0–100.0)
MONO ABS: 0.9 10*3/uL (ref 0.1–1.0)
MONOS PCT: 12 %
NEUTROS ABS: 5.4 10*3/uL (ref 1.7–7.7)
Neutrophils Relative %: 73 %
PLATELETS: 456 10*3/uL — AB (ref 150–400)
RBC: 3.84 MIL/uL — ABNORMAL LOW (ref 4.22–5.81)
RDW: 16.3 % — AB (ref 11.5–15.5)
WBC: 7.4 10*3/uL (ref 4.0–10.5)

## 2017-03-05 LAB — COMPREHENSIVE METABOLIC PANEL
ALBUMIN: 2.4 g/dL — AB (ref 3.5–5.0)
ALT: 14 U/L — ABNORMAL LOW (ref 17–63)
ANION GAP: 8 (ref 5–15)
AST: 18 U/L (ref 15–41)
Alkaline Phosphatase: 205 U/L — ABNORMAL HIGH (ref 38–126)
BILIRUBIN TOTAL: 0.8 mg/dL (ref 0.3–1.2)
BUN: 18 mg/dL (ref 6–20)
CHLORIDE: 95 mmol/L — AB (ref 101–111)
CO2: 33 mmol/L — ABNORMAL HIGH (ref 22–32)
Calcium: 8.1 mg/dL — ABNORMAL LOW (ref 8.9–10.3)
Creatinine, Ser: 0.94 mg/dL (ref 0.61–1.24)
GFR calc Af Amer: >60 mL/min (ref >60)
GFR calc non Af Amer: >60 mL/min (ref >60)
Glucose, Bld: 146 mg/dL — ABNORMAL HIGH (ref 65–99)
POTASSIUM: 3.5 mmol/L (ref 3.5–5.1)
Sodium: 136 mmol/L (ref 135–145)
TOTAL PROTEIN: 5 g/dL — AB (ref 6.5–8.1)

## 2017-03-05 MED ORDER — OXYCODONE-ACETAMINOPHEN 5-325 MG PO TABS: ORAL_TABLET | ORAL | 0 refills | Status: DC

## 2017-03-05 NOTE — Telephone Encounter (Signed)
[facility] #[phone] Fax: [phone]   

## 2017-03-07 ENCOUNTER — Encounter: Payer: Self-pay | Admitting: Internal Medicine

## 2017-03-07 ENCOUNTER — Non-Acute Institutional Stay (SKILLED_NURSING_FACILITY): Payer: Medicare Other | Admitting: Internal Medicine

## 2017-03-07 ENCOUNTER — Encounter (HOSPITAL_COMMUNITY)
Admission: RE | Admit: 2017-03-07 | Discharge: 2017-03-07 | Disposition: A | Discharge status: Home / Self Care | Payer: Medicare Other | Source: Skilled Nursing Facility | Attending: Internal Medicine | Admitting: Internal Medicine

## 2017-03-07 DIAGNOSIS — E876 Hypokalemia: Secondary | ICD-10-CM | POA: Diagnosis not present

## 2017-03-07 DIAGNOSIS — N390 Urinary tract infection, site not specified: Secondary | ICD-10-CM | POA: Diagnosis not present

## 2017-03-07 DIAGNOSIS — D6489 Other specified anemias: Secondary | ICD-10-CM | POA: Insufficient documentation

## 2017-03-07 DIAGNOSIS — J181 Lobar pneumonia, unspecified organism: Secondary | ICD-10-CM

## 2017-03-07 DIAGNOSIS — J189 Pneumonia, unspecified organism: Secondary | ICD-10-CM | POA: Diagnosis not present

## 2017-03-07 LAB — CBC WITH DIFFERENTIAL/PLATELET
BASOS PCT: 0 %
Basophils Absolute: 0 10*3/uL (ref 0.0–0.1)
EOS ABS: 0.1 10*3/uL (ref 0.0–0.7)
EOS PCT: 1 %
HCT: 35.8 % — ABNORMAL LOW (ref 39.0–52.0)
HEMOGLOBIN: 12 g/dL — AB (ref 13.0–17.0)
LYMPHS ABS: 0.8 10*3/uL (ref 0.7–4.0)
Lymphocytes Relative: 15 %
MCH: 31.3 pg (ref 26.0–34.0)
MCHC: 33.5 g/dL (ref 30.0–36.0)
MCV: 93.5 fL (ref 78.0–100.0)
MONO ABS: 0.7 10*3/uL (ref 0.1–1.0)
MONOS PCT: 12 %
Neutro Abs: 4.1 10*3/uL (ref 1.7–7.7)
Neutrophils Relative %: 72 %
Platelets: 407 10*3/uL — ABNORMAL HIGH (ref 150–400)
RBC: 3.83 MIL/uL — ABNORMAL LOW (ref 4.22–5.81)
RDW: 16.2 % — AB (ref 11.5–15.5)
WBC: 5.6 10*3/uL (ref 4.0–10.5)

## 2017-03-07 LAB — BASIC METABOLIC PANEL
Anion gap: 7 (ref 5–15)
BUN: 24 mg/dL — AB (ref 6–20)
CHLORIDE: 95 mmol/L — AB (ref 101–111)
CO2: 34 mmol/L — ABNORMAL HIGH (ref 22–32)
Calcium: 8.5 mg/dL — ABNORMAL LOW (ref 8.9–10.3)
Creatinine, Ser: 1.04 mg/dL (ref 0.61–1.24)
GFR calc non Af Amer: >60 mL/min (ref >60)
Glucose, Bld: 192 mg/dL — ABNORMAL HIGH (ref 65–99)
Potassium: 3.1 mmol/L — ABNORMAL LOW (ref 3.5–5.1)
SODIUM: 136 mmol/L (ref 135–145)

## 2017-03-07 NOTE — Progress Notes (Signed)
Location:   [facility] Room Number: 133/P Place of Service:  SNF [phone]) Provider:  Geronimo Running, MD  Patient Care Team: Mikey Kirschner, MD as PCP - General  Extended Emergency Contact Information Primary Emergency Contact: Eiben,Mike Address: [address] [place] [address] [place] Phone: [phone] Mobile Phone: [phone] Relation: Son Secondary Emergency Contact: Livia Snellen, [place] [address] [place] of [place] Phone: [phone] Relation: Son  Code Status:  DNR Goals of care: Advanced Directive information Advanced Directives 03/07/2017  Does Patient Have a Medical Advance Directive? Yes  Type of Advance Directive Out of facility DNR (pink MOST or yellow form)  Does patient want to make changes to medical advance directive? No - Patient declined  Copy of [place] in Chart? -  Pre-existing out of facility DNR order (yellow form or pink MOST form) -     Chief Complaint  Patient presents with  . Acute Visit    Low Potassium    HPI:  Pt is a 82 y.o. male seen today for an acute visit for Follow-up of hypokalemia.  Patient was seen earlier this week by Dr. Lyndel Safe for somnolence.  Chest x-ray was done which was positive for bilateral infiltrate versus atelectasis and he's been started on Augmentin.  He also was previously hospitalized for UTI and is completing a course of Cefuroxime as of today.  Today he appears to be more alert more interactive although still quite weak with a guarded prognosis.  Labs were done today which appear to be fairly unremarkable except for potassium of 3.1 earlier this week potassium was 3.5 he is not on any diuretic or potassium supplementation.  He is not complaining of any cough or shortness of breath chest pain or palpitations   Past Medical History:  Diagnosis Date  . Diabetes mellitus   . DVT (deep venous  thrombosis) ([place])   . Gastroparesis   . Hypertension   . Legally blind    diabetic retinopathy  . Neuropathy ([place])   . Osteoporosis   . Pulmonary embolism ([place]) 12/2010   on blood thinners  . Stroke ([place])   . Weight loss    Past Surgical History:  Procedure Laterality Date  . ANKLE SURGERY     Patient states that he had pins place in the left foot  . BACK SURGERY  2008  . CARPAL TUNNEL RELEASE  08/2011   Left hand  . COLONOSCOPY  01/2011  . ORIF HIP FRACTURE  10/31/2011   Procedure: OPEN REDUCTION INTERNAL FIXATION HIP;  Surgeon: Arther Abbott, MD;  Location: [facility];  Service: Orthopedics;  Laterality: Right;  with Gamma Nail  . TRANSURETHRAL RESECTION OF PROSTATE  1984  . UPPER GASTROINTESTINAL ENDOSCOPY  01/2011    No Known Allergies  Allergies as of 03/07/2017   No Known Allergies     Medication List       Accurate as of 03/07/17  1:58 PM. Always use your most recent med list.          amoxicillin-clavulanate 500-125 MG tablet Commonly known as:  AUGMENTIN Take 1 tablet by mouth 3 (three) times daily. Until 03/13/17   aspirin 81 MG tablet Take 81 mg by mouth every morning.   cefUROXime 500 MG tablet Commonly known as:  CEFTIN Take 1 tablet (500 mg total) by  mouth 2 (two) times daily with a meal.   citalopram 20 MG tablet Commonly known as:  CELEXA Take 1 tablet (20 mg total) by mouth every morning.   esomeprazole 40 MG capsule Commonly known as:  NEXIUM Take 40 mg by mouth daily as needed. Reported on 05/10/2016   ezetimibe 10 MG tablet Commonly known as:  ZETIA Take 1 tablet (10 mg total) by mouth at bedtime.   feeding supplement (PRO-STAT 64) Liqd Take 30 mLs by mouth 2 (two) times daily.   latanoprost 0.005 % ophthalmic solution Commonly known as:  XALATAN Place 1 drop into both eyes at bedtime.   multivitamin with minerals Tabs tablet Take 1 tablet by mouth daily.   NOVOLOG FLEXPEN 100 UNIT/ML FlexPen Generic drug:  insulin aspart Give per  sliding scale   polyethylene glycol powder powder Commonly known as:  GLYCOLAX/MIRALAX Take 34 g by mouth every morning.   potassium chloride SA 20 MEQ tablet Commonly known as:  K-DUR,KLOR-CON Take 40 meq by mouth once a day until 03/08/17 then take 20 meq by mouth once a day   rivaroxaban 20 MG Tabs tablet Commonly known as:  XARELTO Take 1 tablet (20 mg total) by mouth daily with supper.   saccharomyces boulardii 250 MG capsule Commonly known as:  FLORASTOR Take 250 mg by mouth 2 (two) times daily. Until 03/10/17   SANTYL ointment Generic drug:  collagenase Apply to coccyx wound per tx orders   TRESIBA FLEXTOUCH 100 UNIT/ML Sopn FlexTouch Pen Generic drug:  insulin degludec Inject 8 Units into the muscle at bedtime.   VENELEX Oint Apply to right buttocks and sacrum for prevention every shift   vitamin B-12 1000 MCG tablet Commonly known as:  CYANOCOBALAMIN Take 1,000 mcg by mouth daily.   Vitamin D (Ergocalciferol) 50000 units Caps capsule Commonly known as:  DRISDOL Take 50,000 Units by mouth every 7 (seven) days.       Review of Systems   In general does not complaining fever chills does have weakness and somewhat spotty appetite.  Skin is not rash or itching.  Head ears eyes nose mouth and throat does not complain a sore throat does have some history of trouble swallowing.  Does not complain of visual changes.  Respiratory is not really complaining of cough or shortness of breath he is a poor historian somewhat.  Heart is not complaining of chest pain or significant lower extremity edema.  GI does not complain of any nausea vomiting diarrhea constipation or abdominal pain.  Muscle skeletal does have history at times of back pain also has a history of a fracture of the distal end of the left femur currently in a cast.  He is not really complaining of leg pain at this time.  Neurologic is not complaining of dizziness headache or syncope.  Psych does have  a history of confusion which apparently waxes and wanes apparently this has improved somewhat recently  Immunization History  Administered Date(s) Administered  . Influenza Split 08/31/2013  . Influenza,inj,Quad PF,36+ Mos 09/07/2014, 09/12/2015  . Influenza-Unspecified 09/02/2011, 10/02/2016  . Pneumococcal Conjugate-13 06/07/2014  . Pneumococcal Polysaccharide-23 09/01/2001  . Td 06/01/2009  . Tdap 04/26/2016  . Zoster 05/23/2009   Pertinent  Health Maintenance Due  Topic Date Due  . URINE MICROALBUMIN  02/20/1936  . OPHTHALMOLOGY EXAM  06/12/2016  . FOOT EXAM  04/08/2017  . INFLUENZA VACCINE  07/02/2017  . HEMOGLOBIN A1C  08/31/2017  . PNA vac Low Risk Adult  Completed  Fall Risk  06/10/2016  Falls in the past year? Yes  Number falls in past yr: 1  Injury with Fall? Yes  Risk Factor Category  High Fall Risk  Risk for fall due to : Impaired vision  Follow up Falls evaluation completed;Education provided;Falls prevention discussed   Functional Status Survey:    Vitals:   03/07/17 1356  BP: 114/76  Pulse: 96  Resp: 20  Temp: 97.6 F (36.4 C)  TempSrc: Oral   There is no height or weight on file to calculate BMI. Physical Exam  In general this is a frail appearing male in no distress lying comfortably in bed he is bright and alert, but appears weak.  His skin is warm and dry.  Eyes sclera and conjunctiva are clear he does have a history of visual deficits at baseline.  Oropharynx is clear mucous membranes moist.  Chest is clear to auscultation with somewhat shallow air entry there is no labored breathing.  Heart is regular rate and rhythm with occasional irregular beats he does not have significant lower extremity edema on the right.  His left leg is in a cast.  Abdomen is soft nontender with positive bowel sounds.  Muscle skeletal does have a cast on his left leg otherwise moves other extremities at baseline somewhat limited exam since he is in  bed.  Neurologic is grossly intact no lateralizing findings her speech is clear he does have generalized weakness.  Psyche is oriented to self and place nose the month and year somewhat confused today of week-could basically identify people in the room.    Labs reviewed:  Recent Labs  02/27/17 0436  03/01/17 0626 03/03/17 1400 03/05/17 0700 03/07/17 0700  NA 137  < > 139 134* 136 136  K 4.6  < > 4.0 3.7 3.5 3.1*  CL 97*  < > 98* 95* 95* 95*  CO2 25  < > 27 28 33* 34*  GLUCOSE 334*  < > 268* 276* 146* 192*  BUN 24*  < > 17 26* 18 24*  CREATININE 1.14  < > 0.88 1.27* 0.94 1.04  CALCIUM 8.3*  < > 8.1* 8.2* 8.1* 8.5*  MG 2.1  --  2.0  --   --   --   < > = values in this interval not displayed.  Recent Labs  02/26/17 1252 03/03/17 1400 03/05/17 0700  AST 20 17 18   ALT 13* 13* 14*  ALKPHOS 139* 196* 205*  BILITOT 0.8 1.9* 0.8  PROT 5.4* 4.8* 5.0*  ALBUMIN 2.7* 2.3* 2.4*    Recent Labs  03/03/17 1400 03/05/17 0700 03/07/17 0700  WBC 7.1 7.4 5.6  NEUTROABS 4.9 5.4 4.1  HGB 10.0* 12.0* 12.0*  HCT 29.9* 35.9* 35.8*  MCV 93.4 93.5 93.5  PLT 539* 456* 407*   No results found for: TSH Lab Results  Component Value Date   HGBA1C 7.6 (H) 02/28/2017   Lab Results  Component Value Date   CHOL 208 (H) 05/07/2016   HDL 72 05/07/2016   LDLCALC 119 (H) 05/07/2016   TRIG 86 05/07/2016   CHOLHDL 2.9 05/07/2016    Significant Diagnostic Results in last 30 days:  Dg Chest 2 View  Result Date: 02/26/2017 CLINICAL DATA:  Altered mental status EXAM: CHEST  2 VIEW COMPARISON:  October 29, 2011 FINDINGS: There is no edema or consolidation. Heart size and pulmonary vascularity are normal. No adenopathy. There is degenerative change in the lower thoracic region. IMPRESSION: No edema or consolidation. Electronically  Signed   By: Lowella Grip III M.D.   On: 02/26/2017 14:40   Ct Head Wo Contrast  Result Date: 02/19/2017 CLINICAL DATA:  Altered mental status with history  of fall EXAM: CT HEAD WITHOUT CONTRAST TECHNIQUE: Contiguous axial images were obtained from the base of the skull through the vertex without intravenous contrast. COMPARISON:  10/29/2011 FINDINGS: Brain: No acute territorial infarction, hemorrhage or mass is visualized. Moderate diffuse atrophy. Mild to moderate periventricular and subcortical white matter small vessel ischemic changes. Old lacunar infarcts within the thalamus and right basal ganglia. Enlarged ventricles are stable in size and felt secondary to atrophy. Vascular: No hyperdense vessels. Carotid artery calcifications and vertebral artery calcifications. Skull: No fracture.  No suspicious bone lesion. Sinuses/Orbits: Mucosal thickening in the ethmoid and maxillary sinuses. Bony remottling of the left maxillary sinus wall consistent with chronic sinusitis. No acute orbital abnormality. Other: None IMPRESSION: 1. No CT evidence for acute intracranial abnormality 2. Atrophy with mild to moderate white matter small vessel ischemic changes. Electronically Signed   By: Donavan Foil M.D.   On: 02/19/2017 16:57   Dg Knee Left Port  Result Date: 02/27/2017 CLINICAL DATA:  Fall, 02/15/2017, left femur fracture EXAM: PORTABLE LEFT KNEE - 1-2 VIEW COMPARISON:  01/09/2016 FINDINGS: Two views of the left knee submitted. Study is markedly limited by casting material artifact. Partially visualized intramedullary rod in distal left femur. There is mild displaced minimal angulated fracture in proximal left tibia. Please note the fracture line extends oblique into the lateral tibial plateau. Minimal displaced impacted fracture proximal left fibula IMPRESSION: Study is markedly limited by casting material artifact. Partially visualized intramedullary rod in distal left femur. There is mild displaced minimal angulated fracture in proximal left tibia. Please note the fracture line extends oblique into the lateral tibial plateau. Minimal displaced impacted fracture  proximal left fibula. Electronically Signed   By: Lahoma Crocker M.D.   On: 02/27/2017 11:25   Dg Tibia/fibula Left Port  Result Date: 02/27/2017 CLINICAL DATA:  Left lower leg fracture.  Fall 02/15/2017. EXAM: PORTABLE LEFT TIBIA AND FIBULA - 2 VIEW COMPARISON:  Left femur radiographs 01/09/2016 FINDINGS: A comminuted proximal tibia fracture demonstrates anterior angulation of 20 degrees. A lateral tibial plateau fracture is present. A proximal fibular head fracture is again seen. Distal tibia and fibula ORIF is noted. The study is limited by overlying fiberglass cast. An IM rod is again seen within the distal femur. IMPRESSION: 1. Comminuted proximal tibial fracture with anterior angulation of approximately 20 degrees. 2. Lateral tibial plateau fracture. 3. Proximal fibular head fracture. 4. Inter medullary rod of the femur. 5. Previous ORIF of the distal tibia and fibula. Electronically Signed   By: San Morelle M.D.   On: 02/27/2017 15:50   Dg Femur Port Min 2 Views Left  Result Date: 02/27/2017 CLINICAL DATA:  Fall. EXAM: LEFT FEMUR PORTABLE 2 VIEWS COMPARISON:  01/09/2016. FINDINGS: ORIF left femur. Hardware intact. Posttraumatic bony changes are stable. No acute bony abnormality. Peripheral vascular disease . Penile prosthesis. IMPRESSION: 1. ORIF left femur. Hardware intact. Anatomic alignment. Posttraumatic bony changes are stable. No acute bony abnormality identified. 2. Peripheral vascular disease. Electronically Signed   By: Marcello Moores  Register   On: 02/27/2017 11:13    Assessment/Plan  #1-hypokalemia-Will start potassium will give 40 mEq a day for 2 days and then 20 mEq a day thereafter.  Also will check a magnesium level when we recheck his BMP on Monday, April 9.  Also will need  a repeat BMP in approximately 10 days to ensure stability.  #2 history of pneumonia this appears stable at this point he is on Augmentin-- in regards to UTI has finished his antibiotic as of today does not  complain of dysuria mental status appears to be relatively baseline possibly somewhat improved.  [id number].

## 2017-03-08 ENCOUNTER — Other Ambulatory Visit (HOSPITAL_COMMUNITY)
Admission: RE | Admit: 2017-03-08 | Discharge: 2017-03-08 | Disposition: A | Discharge status: Home / Self Care | Payer: Medicare Other | Source: Skilled Nursing Facility | Attending: Internal Medicine | Admitting: Internal Medicine

## 2017-03-08 DIAGNOSIS — D500 Iron deficiency anemia secondary to blood loss (chronic): Secondary | ICD-10-CM | POA: Insufficient documentation

## 2017-03-08 LAB — OCCULT BLOOD X 1 CARD TO LAB, STOOL: Fecal Occult Bld: NEGATIVE

## 2017-03-09 ENCOUNTER — Encounter (HOSPITAL_COMMUNITY)
Admission: RE | Admit: 2017-03-09 | Discharge: 2017-03-09 | Disposition: A | Discharge status: Home / Self Care | Payer: Medicare Other

## 2017-03-09 LAB — OCCULT BLOOD X 1 CARD TO LAB, STOOL: FECAL OCCULT BLD: NEGATIVE

## 2017-03-10 ENCOUNTER — Encounter (HOSPITAL_COMMUNITY)
Admission: RE | Admit: 2017-03-10 | Discharge: 2017-03-10 | Disposition: A | Discharge status: Home / Self Care | Payer: Medicare Other | Source: Skilled Nursing Facility | Attending: Internal Medicine | Admitting: Internal Medicine

## 2017-03-10 DIAGNOSIS — I10 Essential (primary) hypertension: Secondary | ICD-10-CM | POA: Insufficient documentation

## 2017-03-10 LAB — BASIC METABOLIC PANEL
Anion gap: 15 (ref 5–15)
BUN: 24 mg/dL — AB (ref 6–20)
CALCIUM: 8 mg/dL — AB (ref 8.9–10.3)
CO2: 22 mmol/L (ref 22–32)
CREATININE: 1.12 mg/dL (ref 0.61–1.24)
Chloride: 97 mmol/L — ABNORMAL LOW (ref 101–111)
GFR calc Af Amer: >60 mL/min (ref >60)
GFR, EST NON AFRICAN AMERICAN: 56 mL/min — AB (ref >60)
GLUCOSE: 328 mg/dL — AB (ref 65–99)
Potassium: 4.3 mmol/L (ref 3.5–5.1)
Sodium: 134 mmol/L — ABNORMAL LOW (ref 135–145)

## 2017-03-10 LAB — MAGNESIUM: Magnesium: 2 mg/dL (ref 1.7–2.4)

## 2017-03-10 NOTE — Telephone Encounter (Signed)
Scheduled accordingly

## 2017-03-11 ENCOUNTER — Telehealth: Payer: Self-pay | Admitting: Orthopedic Surgery

## 2017-03-11 NOTE — Telephone Encounter (Signed)
Nurse had some concerns regarding cast due to patient having some loose stools, states some got on the cast the other day and they know they cannot get cast wet to properly clean it, also concerned that some may have gotten under cast.  Advised nurse to attempt to protect cast with whatever they have available, insure no stool goes down in cast, and keep close eye on skin. Advised to contact our office if cast becomes soiled (she stated not soiled at this time) or skin becomes compromised. Nurse Renae agreeable.

## 2017-03-11 NOTE — Telephone Encounter (Signed)
OK 

## 2017-03-11 NOTE — Telephone Encounter (Signed)
Call received from [place], nurse at [facility]; states patient is still in "full cast" on leg, femur fracture, as states Dr Aline Brochure is aware.  States patient's cast has become soiled, due to patient having loose stools.  Discussed appointment. Nurse requests to speak with our nurse.  Direct Ph# [phone]

## 2017-03-14 ENCOUNTER — Other Ambulatory Visit (HOSPITAL_COMMUNITY)
Admission: RE | Admit: 2017-03-14 | Discharge: 2017-03-14 | Disposition: A | Discharge status: Home / Self Care | Payer: Medicare Other | Source: Skilled Nursing Facility | Attending: Internal Medicine | Admitting: Internal Medicine

## 2017-03-14 DIAGNOSIS — D500 Iron deficiency anemia secondary to blood loss (chronic): Secondary | ICD-10-CM | POA: Insufficient documentation

## 2017-03-14 LAB — OCCULT BLOOD X 1 CARD TO LAB, STOOL: Fecal Occult Bld: NEGATIVE

## 2017-03-17 ENCOUNTER — Encounter (HOSPITAL_COMMUNITY)
Admission: RE | Admit: 2017-03-17 | Discharge: 2017-03-17 | Disposition: A | Discharge status: Home / Self Care | Payer: Medicare Other | Source: Skilled Nursing Facility | Attending: *Deleted | Admitting: *Deleted

## 2017-03-19 ENCOUNTER — Other Ambulatory Visit (HOSPITAL_COMMUNITY)
Admission: AD | Admit: 2017-03-19 | Discharge: 2017-03-19 | Disposition: A | Discharge status: Home / Self Care | Payer: Medicare Other | Source: Skilled Nursing Facility | Attending: Internal Medicine | Admitting: Internal Medicine

## 2017-03-19 ENCOUNTER — Encounter: Payer: Self-pay | Admitting: Internal Medicine

## 2017-03-19 ENCOUNTER — Non-Acute Institutional Stay (SKILLED_NURSING_FACILITY): Payer: Medicare Other | Admitting: Internal Medicine

## 2017-03-19 DIAGNOSIS — M6281 Muscle weakness (generalized): Secondary | ICD-10-CM | POA: Insufficient documentation

## 2017-03-19 DIAGNOSIS — R55 Syncope and collapse: Secondary | ICD-10-CM | POA: Diagnosis not present

## 2017-03-19 LAB — CBC WITH DIFFERENTIAL/PLATELET
Basophils Absolute: 0 10*3/uL (ref 0.0–0.1)
Basophils Relative: 0 %
EOS ABS: 0 10*3/uL (ref 0.0–0.7)
EOS PCT: 0 %
HCT: 28.8 % — ABNORMAL LOW (ref 39.0–52.0)
Hemoglobin: 9.6 g/dL — ABNORMAL LOW (ref 13.0–17.0)
LYMPHS ABS: 0.6 10*3/uL — AB (ref 0.7–4.0)
Lymphocytes Relative: 8 %
MCH: 31.8 pg (ref 26.0–34.0)
MCHC: 33.3 g/dL (ref 30.0–36.0)
MCV: 95.4 fL (ref 78.0–100.0)
Monocytes Absolute: 0.5 10*3/uL (ref 0.1–1.0)
Monocytes Relative: 7 %
Neutro Abs: 6.2 10*3/uL (ref 1.7–7.7)
Neutrophils Relative %: 85 %
PLATELETS: 303 10*3/uL (ref 150–400)
RBC: 3.02 MIL/uL — AB (ref 4.22–5.81)
RDW: 16.9 % — AB (ref 11.5–15.5)
WBC: 7.3 10*3/uL (ref 4.0–10.5)

## 2017-03-19 LAB — COMPREHENSIVE METABOLIC PANEL
ALT: 11 U/L — AB (ref 17–63)
ANION GAP: 6 (ref 5–15)
AST: 19 U/L (ref 15–41)
Albumin: 2.5 g/dL — ABNORMAL LOW (ref 3.5–5.0)
Alkaline Phosphatase: 167 U/L — ABNORMAL HIGH (ref 38–126)
BUN: 30 mg/dL — ABNORMAL HIGH (ref 6–20)
CHLORIDE: 100 mmol/L — AB (ref 101–111)
CO2: 28 mmol/L (ref 22–32)
Calcium: 8.2 mg/dL — ABNORMAL LOW (ref 8.9–10.3)
Creatinine, Ser: 0.83 mg/dL (ref 0.61–1.24)
GFR calc non Af Amer: >60 mL/min (ref >60)
Glucose, Bld: 198 mg/dL — ABNORMAL HIGH (ref 65–99)
POTASSIUM: 3.8 mmol/L (ref 3.5–5.1)
SODIUM: 134 mmol/L — AB (ref 135–145)
Total Bilirubin: 0.5 mg/dL (ref 0.3–1.2)
Total Protein: 5.3 g/dL — ABNORMAL LOW (ref 6.5–8.1)

## 2017-03-19 LAB — TROPONIN I: Troponin I: <0.03 ng/mL (ref <0.03)

## 2017-03-19 NOTE — Progress Notes (Signed)
Location:   [facility] Room Number: 133/P Place of Service:  SNF [phone]) Provider:  Geronimo Running, MD  Patient Care Team: Mikey Kirschner, MD as PCP - General  Extended Emergency Contact Information Primary Emergency Contact: Cappella,Mike Address: [address] [place] [address] [place] Phone: [phone] Mobile Phone: [phone] Relation: Son Secondary Emergency Contact: Livia Snellen, [place] [address] [place] of [place] Phone: [phone] Relation: Son  Code Status:  DNR Goals of care: Advanced Directive information Advanced Directives 03/19/2017  Does Patient Have a Medical Advance Directive? Yes  Type of Advance Directive Out of facility DNR (pink MOST or yellow form)  Does patient want to make changes to medical advance directive? No - Patient declined  Copy of [place] in Chart? -  Pre-existing out of facility DNR order (yellow form or pink MOST form) -     Chief Complaint  Patient presents with  . Acute Visit    Syncope    HPI:  Pt is a 83 y.o. male seen today for an acute visit for A possible syncopal episode.  He has a complicated medical history with a recent proximal left fracture of the tibia and continues on a long leg cast.  Since his admission here is been hospitalized for sepsis with a UTI-.  On readmission at some point he was found to be somewhat somnolent and workup did show possible pneumonia and he was treated with antibiotics for that  He continues to be somewhat weak and frail-he does have a history of diabetes is well he is on [place] --  He also has a history of DVT and pulmonary embolism and is on Xarelto.  Apparently one he was being weighed this morning he had an unresponsive episode-he was quickly put in bed were apparently he responded fairly quickly in fact by the time I entered the room he was alert and talking appeared weak  but relatively at his baseline.  He denied any chest pain or shortness of breath dizziness or headache-his vital signs were stable systolic blood pressure was actually in the 130s-I do note at times he has lower end systolics in the low 503T there is some thought that some of this may of been orthostatic related.   did do serial checks on him. He remained stable without complaints of pain-appeared to be resting comfortably according to his sitter who knows him very well the episode with the possible syncope was unusual for him but now he appears to be essentially at his baseline-he is intermittently napping which is not new for him at this time of day.       Past Medical History:  Diagnosis Date  . Diabetes mellitus   . DVT (deep venous thrombosis) ([facility])   . Gastroparesis   . Hypertension   . Legally blind    diabetic retinopathy  . Neuropathy   . Osteoporosis   . Pulmonary embolism ([facility]) 12/2010   on blood thinners  . Stroke ([facility])   . Weight loss    Past Surgical History:  Procedure Laterality Date  . ANKLE SURGERY     Patient states that he had pins place in the left foot  . BACK SURGERY  2008  . CARPAL TUNNEL RELEASE  08/2011   Left hand  . COLONOSCOPY  01/2011  . ORIF  HIP FRACTURE  10/31/2011   Procedure: OPEN REDUCTION INTERNAL FIXATION HIP;  Surgeon: Arther Abbott, MD;  Location: [facility];  Service: Orthopedics;  Laterality: Right;  with Gamma Nail  . TRANSURETHRAL RESECTION OF PROSTATE  1984  . UPPER GASTROINTESTINAL ENDOSCOPY  01/2011    No Known Allergies  Outpatient Encounter Prescriptions as of 03/19/2017  Medication Sig  . Amino Acids-Protein Hydrolys (FEEDING SUPPLEMENT, PRO-STAT 64,) LIQD Take 30 mLs by mouth 2 (two) times daily.  Marland Kitchen aspirin 81 MG tablet Take 81 mg by mouth every morning.   Roseanne Kaufman [place]-Castor Oil (VENELEX) OINT Apply to right buttocks and sacrum for prevention every shift  . citalopram (CELEXA) 20 MG tablet Take 1 tablet (20 mg total) by  mouth every morning.  . collagenase (SANTYL) ointment Apply to coccyx wound per tx orders  . esomeprazole (NEXIUM) 40 MG capsule Take 40 mg by mouth daily as needed. Reported on 05/10/2016  . ezetimibe (ZETIA) 10 MG tablet Take 1 tablet (10 mg total) by mouth at bedtime.  . insulin aspart (NOVOLOG FLEXPEN) 100 UNIT/ML FlexPen Give per sliding scale  . latanoprost (XALATAN) 0.005 % ophthalmic solution Place 1 drop into both eyes at bedtime.   . Multiple Vitamin (MULTIVITAMIN WITH MINERALS) TABS tablet Take 1 tablet by mouth daily.  . polyethylene glycol powder (GLYCOLAX/MIRALAX) powder Take 34 g by mouth every morning.  . potassium chloride SA (K-DUR,KLOR-CON) 20 MEQ tablet Take 20 meq by mouth once a day until 03/08/17 then take 20 meq by mouth once a day  . rivaroxaban (XARELTO) 20 MG TABS tablet Take 1 tablet (20 mg total) by mouth daily with supper.  Tyler Aas FLEXTOUCH 100 UNIT/ML SOPN FlexTouch Pen Inject 8 Units into the muscle at bedtime.   . vitamin B-12 (CYANOCOBALAMIN) 1000 MCG tablet Take 1,000 mcg by mouth daily.    . Vitamin D, Ergocalciferol, (DRISDOL) 50000 UNITS CAPS Take 50,000 Units by mouth every 7 (seven) days.   . [DISCONTINUED] amoxicillin-clavulanate (AUGMENTIN) 500-125 MG tablet Take 1 tablet by mouth 3 (three) times daily. Until 03/13/17  . [DISCONTINUED] cefUROXime (CEFTIN) 500 MG tablet Take 1 tablet (500 mg total) by mouth 2 (two) times daily with a meal.  . [DISCONTINUED] saccharomyces boulardii (FLORASTOR) 250 MG capsule Take 250 mg by mouth 2 (two) times daily. Until 03/10/17   No facility-administered encounter medications on file as of 03/19/2017.     Review of Systems   In general is not complaining of any fever or chills.  Skin does not complain diaphoresis or itching.  Head ears eyes nose mouth and throat denies visual changes or difficulty swallowing.  Respiratory denies any cough or shortness of breath   Cardiac consistently denies any chest pain does not  appear to have significant lower extremity edema.  GI he is not complaining of any abdominal discomfort nausea vomiting diarrhea or constipation.  Musculoskeletal other than weakness has no complaints of joint pain does have his left leg in a long leg cast not be a good surgical candidate.  Neurologic is not complaining of dizziness headache or numbness or syncopal-type feelings at this time.  Psych does not complain of being anxious   Immunization History  Administered Date(s) Administered  . Influenza Split 08/31/2013  . Influenza,inj,Quad PF,36+ Mos 09/07/2014, 09/12/2015  . Influenza-Unspecified 09/02/2011, 10/02/2016  . Pneumococcal Conjugate-13 06/07/2014  . Pneumococcal Polysaccharide-23 09/01/2001  . Td 06/01/2009  . Tdap 04/26/2016  . Zoster 05/23/2009   Pertinent  Health Maintenance Due  Topic Date  Due  . URINE MICROALBUMIN  02/20/1936  . OPHTHALMOLOGY EXAM  06/12/2016  . FOOT EXAM  04/08/2017  . INFLUENZA VACCINE  07/02/2017  . HEMOGLOBIN A1C  08/31/2017  . PNA vac Low Risk Adult  Completed   Fall Risk  06/10/2016  Falls in the past year? Yes  Number falls in past yr: 1  Injury with Fall? Yes  Risk Factor Category  High Fall Risk  Risk for fall due to : Impaired vision  Follow up Falls evaluation completed;Education provided;Falls prevention discussed   Functional Status Survey:    He is currently afebrile pulse is in the 90s and respirations of 18 blood pressure and also for systolic 161 O2 saturation is in the 90s on room air CBG is in the mid 200s  Physical Exam   In general this is a frail appearing only barely does not appear to be in any distress he is alert and responsive.  His skin is warm and dry he is not diaphoretic.  Heart is somewhat distant heart sounds regular rate and rhythm he does not have significant lower extremity edema.  Chest is clear to auscultation with somewhat shallow air entry there is no labored breathing.  Abdomen is soft  nontender with positive bowel sounds.  Musculoskeletal moves all extremities 4 again limited left leg secondary to the hard cast  Neurologic is grossly intact to speech is clear-moves all extremities at baseline grip strength is strong bilaterally limited strength of his left leg secondary to the hard cast but moves his extremities essentially at baseline shoulder shrug is intact able to lift his arms without difficulty.  Visual acuity appears grossly intact--extraocular movements are intact pupils appear to be reactive.  I do not see any changes from baseline  Psyche is oriented to self somewhat confused but this is not new he is able to actually tell me his name where he is at panel status appears to be at baseline.    Labs reviewed:  Recent Labs  02/27/17 0436  03/01/17 0626  03/05/17 0700 03/07/17 0700 03/10/17 0700  NA 137  < > 139  < > 136 136 134*  K 4.6  < > 4.0  < > 3.5 3.1* 4.3  CL 97*  < > 98*  < > 95* 95* 97*  CO2 25  < > 27  < > 33* 34* 22  GLUCOSE 334*  < > 268*  < > 146* 192* 328*  BUN 24*  < > 17  < > 18 24* 24*  CREATININE 1.14  < > 0.88  < > 0.94 1.04 1.12  CALCIUM 8.3*  < > 8.1*  < > 8.1* 8.5* 8.0*  MG 2.1  --  2.0  --   --   --  2.0  < > = values in this interval not displayed.  Recent Labs  02/26/17 1252 03/03/17 1400 03/05/17 0700  AST '20 17 18  ' ALT 13* 13* 14*  ALKPHOS 139* 196* 205*  BILITOT 0.8 1.9* 0.8  PROT 5.4* 4.8* 5.0*  ALBUMIN 2.7* 2.3* 2.4*    Recent Labs  03/03/17 1400 03/05/17 0700 03/07/17 0700  WBC 7.1 7.4 5.6  NEUTROABS 4.9 5.4 4.1  HGB 10.0* 12.0* 12.0*  HCT 29.9* 35.9* 35.8*  MCV 93.4 93.5 93.5  PLT 539* 456* 407*   No results found for: TSH Lab Results  Component Value Date   HGBA1C 7.6 (H) 02/28/2017   Lab Results  Component Value Date   CHOL 208 (  H) 05/07/2016   HDL 72 05/07/2016   LDLCALC 119 (H) 05/07/2016   TRIG 86 05/07/2016   CHOLHDL 2.9 05/07/2016    Significant Diagnostic Results in last 30  days:  Dg Chest 2 View  Result Date: 02/26/2017 CLINICAL DATA:  Altered mental status EXAM: CHEST  2 VIEW COMPARISON:  October 29, 2011 FINDINGS: There is no edema or consolidation. Heart size and pulmonary vascularity are normal. No adenopathy. There is degenerative change in the lower thoracic region. IMPRESSION: No edema or consolidation. Electronically Signed   By: Lowella Grip III M.D.   On: 02/26/2017 14:40   Ct Head Wo Contrast  Result Date: 02/19/2017 CLINICAL DATA:  Altered mental status with history of fall EXAM: CT HEAD WITHOUT CONTRAST TECHNIQUE: Contiguous axial images were obtained from the base of the skull through the vertex without intravenous contrast. COMPARISON:  10/29/2011 FINDINGS: Brain: No acute territorial infarction, hemorrhage or mass is visualized. Moderate diffuse atrophy. Mild to moderate periventricular and subcortical white matter small vessel ischemic changes. Old lacunar infarcts within the thalamus and right basal ganglia. Enlarged ventricles are stable in size and felt secondary to atrophy. Vascular: No hyperdense vessels. Carotid artery calcifications and vertebral artery calcifications. Skull: No fracture.  No suspicious bone lesion. Sinuses/Orbits: Mucosal thickening in the ethmoid and maxillary sinuses. Bony remottling of the left maxillary sinus wall consistent with chronic sinusitis. No acute orbital abnormality. Other: None IMPRESSION: 1. No CT evidence for acute intracranial abnormality 2. Atrophy with mild to moderate white matter small vessel ischemic changes. Electronically Signed   By: Donavan Foil M.D.   On: 02/19/2017 16:57   Dg Knee Left Port  Result Date: 02/27/2017 CLINICAL DATA:  Fall, 02/15/2017, left femur fracture EXAM: PORTABLE LEFT KNEE - 1-2 VIEW COMPARISON:  01/09/2016 FINDINGS: Two views of the left knee submitted. Study is markedly limited by casting material artifact. Partially visualized intramedullary rod in distal left femur. There  is mild displaced minimal angulated fracture in proximal left tibia. Please note the fracture line extends oblique into the lateral tibial plateau. Minimal displaced impacted fracture proximal left fibula IMPRESSION: Study is markedly limited by casting material artifact. Partially visualized intramedullary rod in distal left femur. There is mild displaced minimal angulated fracture in proximal left tibia. Please note the fracture line extends oblique into the lateral tibial plateau. Minimal displaced impacted fracture proximal left fibula. Electronically Signed   By: Lahoma Crocker M.D.   On: 02/27/2017 11:25   Dg Tibia/fibula Left Port  Result Date: 02/27/2017 CLINICAL DATA:  Left lower leg fracture.  Fall 02/15/2017. EXAM: PORTABLE LEFT TIBIA AND FIBULA - 2 VIEW COMPARISON:  Left femur radiographs 01/09/2016 FINDINGS: A comminuted proximal tibia fracture demonstrates anterior angulation of 20 degrees. A lateral tibial plateau fracture is present. A proximal fibular head fracture is again seen. Distal tibia and fibula ORIF is noted. The study is limited by overlying fiberglass cast. An IM rod is again seen within the distal femur. IMPRESSION: 1. Comminuted proximal tibial fracture with anterior angulation of approximately 20 degrees. 2. Lateral tibial plateau fracture. 3. Proximal fibular head fracture. 4. Inter medullary rod of the femur. 5. Previous ORIF of the distal tibia and fibula. Electronically Signed   By: San Morelle M.D.   On: 02/27/2017 15:50   Dg Femur Port Min 2 Views Left  Result Date: 02/27/2017 CLINICAL DATA:  Fall. EXAM: LEFT FEMUR PORTABLE 2 VIEWS COMPARISON:  01/09/2016. FINDINGS: ORIF left femur. Hardware intact. Posttraumatic bony changes are stable. No  acute bony abnormality. Peripheral vascular disease . Penile prosthesis. IMPRESSION: 1. ORIF left femur. Hardware intact. Anatomic alignment. Posttraumatic bony changes are stable. No acute bony abnormality identified. 2.  Peripheral vascular disease. Electronically Signed   By: Marcello Moores  Register   On: 02/27/2017 11:13    Assessment/Plan  Question syncope unresponsive episode of short duration-described as above-I did speak with micaceous son he would prefer as follows to the facility in avoiding ER visit-at this point will order stat blood work including a CBC with differential a CMP a TSH and a troponin level.  Also will obtain an EKG.  Also will order vital signs with neural checks every hour 2 then every 2 hours 2-every 4 hours 2 and then every shift.  We will await results of the labs and the EKG but I did do serial exams his status remained unchanged continued to be alert -- physical exam was relatively unchanged  Also will order orthostatic blood pressures lying and standing daily he is not on any antihypertensive medications  [id number]  Addendum we have obtained the EKG which shows normal sinus rhythm with possible first-degree AV block-.  Lab work appears to be fairly stable sodium of 134 potassium 3.8 BUN 30 creatinine 0.83.  Alk phosphatase is mildly elevated at 167 which is not new this is relatively baseline.  His hemoglobin is 9.6 which appears down from 12 range here earlier this month however his hemoglobin had been 9.2 back in late March-occult  blood testing has been negative at this point we will update this next week.          Oralia Manis, [place]

## 2017-03-21 ENCOUNTER — Non-Acute Institutional Stay (SKILLED_NURSING_FACILITY): Payer: Medicare Other | Admitting: Internal Medicine

## 2017-03-21 ENCOUNTER — Encounter: Payer: Self-pay | Admitting: Internal Medicine

## 2017-03-21 DIAGNOSIS — D649 Anemia, unspecified: Secondary | ICD-10-CM

## 2017-03-21 DIAGNOSIS — R634 Abnormal weight loss: Secondary | ICD-10-CM | POA: Diagnosis not present

## 2017-03-21 LAB — URINE CULTURE: CULTURE: NO GROWTH

## 2017-03-21 NOTE — Progress Notes (Signed)
Location:   [facility] Room Number: 133/P Place of Service:  SNF [phone]) Provider:  Geronimo Running, MD  Patient Care Team: Mikey Kirschner, MD as PCP - General  Extended Emergency Contact Information Primary Emergency Contact: Baugh,Mike Address: [address] [place] [address] [place] Phone: [phone] Mobile Phone: [phone] Relation: Son Secondary Emergency Contact: Livia Snellen, [place] [address] [place] of [place] Phone: [phone] Relation: Son  Code Status:  DNR Goals of care: Advanced Directive information Advanced Directives 03/21/2017  Does Patient Have a Medical Advance Directive? Yes  Type of Advance Directive Out of facility DNR (pink MOST or yellow form)  Does patient want to make changes to medical advance directive? No - Patient declined  Copy of [place] in Chart? -  Pre-existing out of facility DNR order (yellow form or pink MOST form) -     Chief Complaint  Patient presents with  . Acute Visit    Wt. Loss    HPI:  Pt is a 83 y.o. male seen today for an acute visit for Follow-up of weight loss. Per chart review it appears he has lost about 14 pounds since March 24.  He has a complicated medical history with a recent proximal left fracture of the tibia and continues on a long leg cast.  Since his admission here is been hospitalized for sepsis with a UTI-.  On readmission at some point he was found to be somewhat somnolent and workup did show possible pneumonia and he was treated with antibiotics for that  He continues to be somewhat weak and frail-he does have a history of diabetes is well he is on [place] --  He also has a history of DVT and pulmonary embolism and is on Xarelto  I saw him earlier this week for suspected syncopal episode-lab work and EKG did not really show any acute process-per discussion with his family  apparently he's had a history of these episodes over a period of years they appear to resolve unremarkably.  Currently he is resting in his room comfortably his sitter says he eats fairly well at times but apparently appetite is somewhat spotty.  He denies any abdominal discomfort-.  He thinks he eats enough     Past Medical History:  Diagnosis Date  . Diabetes mellitus   . DVT (deep venous thrombosis) ([place])   . Gastroparesis   . Hypertension   . Legally blind    diabetic retinopathy  . Neuropathy   . Osteoporosis   . Pulmonary embolism ([place]) 12/2010   on blood thinners  . Stroke ([place])   . Weight loss    Past Surgical History:  Procedure Laterality Date  . ANKLE SURGERY     Patient states that he had pins place in the left foot  . BACK SURGERY  2008  . CARPAL TUNNEL RELEASE  08/2011   Left hand  . COLONOSCOPY  01/2011  . ORIF HIP FRACTURE  10/31/2011   Procedure: OPEN REDUCTION INTERNAL FIXATION HIP;  Surgeon: Arther Abbott, MD;  Location: [facility];  Service: Orthopedics;  Laterality: Right;  with Gamma Nail  . TRANSURETHRAL RESECTION OF PROSTATE  1984  . UPPER GASTROINTESTINAL ENDOSCOPY  01/2011    No Known Allergies  Outpatient Encounter Prescriptions as of 03/21/2017  Medication Sig  . Amino Acids-Protein Hydrolys (  FEEDING SUPPLEMENT, PRO-STAT 64,) LIQD Take 30 mLs by mouth 2 (two) times daily.  Marland Kitchen aspirin 81 MG tablet Take 81 mg by mouth every morning.   Roseanne Kaufman [place]-Castor Oil (VENELEX) OINT Apply to right buttocks and sacrum for prevention every shift  . citalopram (CELEXA) 20 MG tablet Take 1 tablet (20 mg total) by mouth every morning.  . collagenase (SANTYL) ointment Apply to coccyx wound per tx orders  . esomeprazole (NEXIUM) 40 MG capsule Take 40 mg by mouth daily as needed. Reported on 05/10/2016  . ezetimibe (ZETIA) 10 MG tablet Take 1 tablet (10 mg total) by mouth at bedtime.  . insulin aspart (NOVOLOG FLEXPEN) 100 UNIT/ML FlexPen Give per sliding scale    . latanoprost (XALATAN) 0.005 % ophthalmic solution Place 1 drop into both eyes at bedtime.   . Multiple Vitamin (MULTIVITAMIN WITH MINERALS) TABS tablet Take 1 tablet by mouth daily.  . polyethylene glycol powder (GLYCOLAX/MIRALAX) powder Take 34 g by mouth every morning.  . potassium chloride SA (K-DUR,KLOR-CON) 20 MEQ tablet Take 20 meq by mouth once a day until 03/08/17 then take 20 meq by mouth once a day  . rivaroxaban (XARELTO) 20 MG TABS tablet Take 1 tablet (20 mg total) by mouth daily with supper.  Tyler Aas FLEXTOUCH 100 UNIT/ML SOPN FlexTouch Pen Inject 8 Units into the muscle at bedtime.   . vitamin B-12 (CYANOCOBALAMIN) 1000 MCG tablet Take 1,000 mcg by mouth daily.    . Vitamin D, Ergocalciferol, (DRISDOL) 50000 UNITS CAPS Take 50,000 Units by mouth every 7 (seven) days.    No facility-administered encounter medications on file as of 03/21/2017.     Review of Systems   In general is not complaining of any fever or chills.  Skin does not complain diaphoresis or itching.  Head ears eyes nose mouth and throat denies visual changes or increased difficulty swallowing.  Respiratory denies any cough or shortness of breath   Cardiac  denies any chest pain does not appear to have significant lower extremity edema.  GI he is not complaining of any abdominal discomfort nausea vomiting or constipation.--Has diarrhea intermittently at times apparently  Musculoskeletal other than weakness has no complaints of joint pain does have his left leg in a long leg cast not be a good surgical candidate.  Neurologic is not complaining of dizziness headache or numbness or syncopal-type feelings at this time.  Psych does not complain of being anxious    Immunization History  Administered Date(s) Administered  . Influenza Split 08/31/2013  . Influenza,inj,Quad PF,36+ Mos 09/07/2014, 09/12/2015  . Influenza-Unspecified 09/02/2011, 10/02/2016  . Pneumococcal Conjugate-13 06/07/2014   . Pneumococcal Polysaccharide-23 09/01/2001  . Td 06/01/2009  . Tdap 04/26/2016  . Zoster 05/23/2009   Pertinent  Health Maintenance Due  Topic Date Due  . URINE MICROALBUMIN  02/20/1936  . OPHTHALMOLOGY EXAM  06/12/2016  . FOOT EXAM  04/08/2017  . INFLUENZA VACCINE  07/02/2017  . HEMOGLOBIN A1C  08/31/2017  . PNA vac Low Risk Adult  Completed   Fall Risk  06/10/2016  Falls in the past year? Yes  Number falls in past yr: 1  Injury with Fall? Yes  Risk Factor Category  High Fall Risk  Risk for fall due to : Impaired vision  Follow up Falls evaluation completed;Education provided;Falls prevention discussed   Functional Status Survey:    Vitals:   03/21/17 1432  BP: 123/73  Pulse: 92  Resp: 20  Temp: (!) 96.3 F (35.7 C)  TempSrc:  Oral  Weight is 117.8 pounds  Physical Exam   In general this is a frail appearing elderly male does not appear to be in any distress he is alert and responsive.  His skin is warm and dry he is not diaphoretic.  Oropharynx is clear mucous membranes moist  Heart is somewhat distant heart sounds regular rate and rhythm he does not have significant lower extremity edema.  Chest is clear to auscultation -- shallow air entry there is no labored breathing.  Abdomen is soft nontender with positive bowel sounds.  Musculoskeletal moves all extremities 4 again limited left leg secondary to the hard cast  Neurologic is grossly intact to speech is clear-moves all extremities at baseline  With limited movement of his left leg because of the cast he is able to wiggle his toes  Visual acuity appears grossly intact--extraocular movements are intact pupils appear to be reactive.  I do not see any changes from baseline  Psyche is oriented to self somewhat confused but this is not new he continues to be pleasant and appropriate   Labs reviewed:  Recent Labs  02/27/17 0436  03/01/17 0626  03/07/17 0700 03/10/17 0700 03/19/17 1412   NA 137  < > 139  < > 136 134* 134*  K 4.6  < > 4.0  < > 3.1* 4.3 3.8  CL 97*  < > 98*  < > 95* 97* 100*  CO2 25  < > 27  < > 34* 22 28  GLUCOSE 334*  < > 268*  < > 192* 328* 198*  BUN 24*  < > 17  < > 24* 24* 30*  CREATININE 1.14  < > 0.88  < > 1.04 1.12 0.83  CALCIUM 8.3*  < > 8.1*  < > 8.5* 8.0* 8.2*  MG 2.1  --  2.0  --   --  2.0  --   < > = values in this interval not displayed.  Recent Labs  03/03/17 1400 03/05/17 0700 03/19/17 1412  AST 17 18 19   ALT 13* 14* 11*  ALKPHOS 196* 205* 167*  BILITOT 1.9* 0.8 0.5  PROT 4.8* 5.0* 5.3*  ALBUMIN 2.3* 2.4* 2.5*    Recent Labs  03/05/17 0700 03/07/17 0700 03/19/17 1412  WBC 7.4 5.6 7.3  NEUTROABS 5.4 4.1 6.2  HGB 12.0* 12.0* 9.6*  HCT 35.9* 35.8* 28.8*  MCV 93.5 93.5 95.4  PLT 456* 407* 303   No results found for: TSH Lab Results  Component Value Date   HGBA1C 7.6 (H) 02/28/2017   Lab Results  Component Value Date   CHOL 208 (H) 05/07/2016   HDL 72 05/07/2016   LDLCALC 119 (H) 05/07/2016   TRIG 86 05/07/2016   CHOLHDL 2.9 05/07/2016    Significant Diagnostic Results in last 30 days:  Dg Chest 2 View  Result Date: 02/26/2017 CLINICAL DATA:  Altered mental status EXAM: CHEST  2 VIEW COMPARISON:  October 29, 2011 FINDINGS: There is no edema or consolidation. Heart size and pulmonary vascularity are normal. No adenopathy. There is degenerative change in the lower thoracic region. IMPRESSION: No edema or consolidation. Electronically Signed   By: Lowella Grip III M.D.   On: 02/26/2017 14:40   Ct Head Wo Contrast  Result Date: 02/19/2017 CLINICAL DATA:  Altered mental status with history of fall EXAM: CT HEAD WITHOUT CONTRAST TECHNIQUE: Contiguous axial images were obtained from the base of the skull through the vertex without intravenous contrast. COMPARISON:  10/29/2011 FINDINGS: Brain:  No acute territorial infarction, hemorrhage or mass is visualized. Moderate diffuse atrophy. Mild to moderate periventricular  and subcortical white matter small vessel ischemic changes. Old lacunar infarcts within the thalamus and right basal ganglia. Enlarged ventricles are stable in size and felt secondary to atrophy. Vascular: No hyperdense vessels. Carotid artery calcifications and vertebral artery calcifications. Skull: No fracture.  No suspicious bone lesion. Sinuses/Orbits: Mucosal thickening in the ethmoid and maxillary sinuses. Bony remottling of the left maxillary sinus wall consistent with chronic sinusitis. No acute orbital abnormality. Other: None IMPRESSION: 1. No CT evidence for acute intracranial abnormality 2. Atrophy with mild to moderate white matter small vessel ischemic changes. Electronically Signed   By: Donavan Foil M.D.   On: 02/19/2017 16:57   Dg Knee Left Port  Result Date: 02/27/2017 CLINICAL DATA:  Fall, 02/15/2017, left femur fracture EXAM: PORTABLE LEFT KNEE - 1-2 VIEW COMPARISON:  01/09/2016 FINDINGS: Two views of the left knee submitted. Study is markedly limited by casting material artifact. Partially visualized intramedullary rod in distal left femur. There is mild displaced minimal angulated fracture in proximal left tibia. Please note the fracture line extends oblique into the lateral tibial plateau. Minimal displaced impacted fracture proximal left fibula IMPRESSION: Study is markedly limited by casting material artifact. Partially visualized intramedullary rod in distal left femur. There is mild displaced minimal angulated fracture in proximal left tibia. Please note the fracture line extends oblique into the lateral tibial plateau. Minimal displaced impacted fracture proximal left fibula. Electronically Signed   By: Lahoma Crocker M.D.   On: 02/27/2017 11:25   Dg Tibia/fibula Left Port  Result Date: 02/27/2017 CLINICAL DATA:  Left lower leg fracture.  Fall 02/15/2017. EXAM: PORTABLE LEFT TIBIA AND FIBULA - 2 VIEW COMPARISON:  Left femur radiographs 01/09/2016 FINDINGS: A comminuted proximal  tibia fracture demonstrates anterior angulation of 20 degrees. A lateral tibial plateau fracture is present. A proximal fibular head fracture is again seen. Distal tibia and fibula ORIF is noted. The study is limited by overlying fiberglass cast. An IM rod is again seen within the distal femur. IMPRESSION: 1. Comminuted proximal tibial fracture with anterior angulation of approximately 20 degrees. 2. Lateral tibial plateau fracture. 3. Proximal fibular head fracture. 4. Inter medullary rod of the femur. 5. Previous ORIF of the distal tibia and fibula. Electronically Signed   By: San Morelle M.D.   On: 02/27/2017 15:50   Dg Femur Port Min 2 Views Left  Result Date: 02/27/2017 CLINICAL DATA:  Fall. EXAM: LEFT FEMUR PORTABLE 2 VIEWS COMPARISON:  01/09/2016. FINDINGS: ORIF left femur. Hardware intact. Posttraumatic bony changes are stable. No acute bony abnormality. Peripheral vascular disease . Penile prosthesis. IMPRESSION: 1. ORIF left femur. Hardware intact. Anatomic alignment. Posttraumatic bony changes are stable. No acute bony abnormality identified. 2. Peripheral vascular disease. Electronically Signed   By: Marcello Moores  Register   On: 02/27/2017 11:13    Assessment/Plan #1 weight loss-I suspect the patient's comorbidities there is some failure to thrive-this was discussed with Dr. Lyndel Safe via phone and will start Remeron 7.5 mg daily and continue to monitor.  Lab work done on April 18 showed stability-albumin was low however at 2.5 which is baseline with the previous value.  Continue supplements he is followed by dietary as well.  #2 anemia suspect there is an element of chronic disease here hemoglobin has been somewhat variable ranging from 9.2 on March 30 up to 12.0 on April 6-hemoglobin most recently 9.6 on lab done on April 18  update  CBC is pending for next week-- occult blood testing has been negative  This was discussed with Dr. Lyndel Safe via phone as  well.  [place], [place], [place]

## 2017-03-25 ENCOUNTER — Encounter (HOSPITAL_COMMUNITY)
Admission: RE | Admit: 2017-03-25 | Discharge: 2017-03-25 | Disposition: A | Discharge status: Home / Self Care | Payer: Medicare Other | Source: Skilled Nursing Facility | Attending: Internal Medicine | Admitting: Internal Medicine

## 2017-03-25 ENCOUNTER — Ambulatory Visit (HOSPITAL_COMMUNITY): Payer: No Typology Code available for payment source | Attending: Internal Medicine

## 2017-03-25 ENCOUNTER — Ambulatory Visit (INDEPENDENT_AMBULATORY_CARE_PROVIDER_SITE_OTHER): Payer: Medicare Other | Admitting: Orthopedic Surgery

## 2017-03-25 ENCOUNTER — Encounter: Payer: Self-pay | Admitting: Orthopedic Surgery

## 2017-03-25 ENCOUNTER — Telehealth: Payer: Self-pay | Admitting: Orthopedic Surgery

## 2017-03-25 VITALS — BP 110/67 | HR 68 | Ht 68.0 in | Wt 127.0 lb

## 2017-03-25 DIAGNOSIS — S82302A Unspecified fracture of lower end of left tibia, initial encounter for closed fracture: Secondary | ICD-10-CM | POA: Diagnosis not present

## 2017-03-25 DIAGNOSIS — S82832A Other fracture of upper and lower end of left fibula, initial encounter for closed fracture: Secondary | ICD-10-CM | POA: Diagnosis not present

## 2017-03-25 DIAGNOSIS — S82832D Other fracture of upper and lower end of left fibula, subsequent encounter for closed fracture with routine healing: Secondary | ICD-10-CM | POA: Insufficient documentation

## 2017-03-25 DIAGNOSIS — S72142A Displaced intertrochanteric fracture of left femur, initial encounter for closed fracture: Secondary | ICD-10-CM | POA: Diagnosis not present

## 2017-03-25 DIAGNOSIS — S82142D Displaced bicondylar fracture of left tibia, subsequent encounter for closed fracture with routine healing: Secondary | ICD-10-CM | POA: Diagnosis not present

## 2017-03-25 DIAGNOSIS — I739 Peripheral vascular disease, unspecified: Secondary | ICD-10-CM | POA: Insufficient documentation

## 2017-03-25 DIAGNOSIS — X58XXXD Exposure to other specified factors, subsequent encounter: Secondary | ICD-10-CM | POA: Insufficient documentation

## 2017-03-25 DIAGNOSIS — D6489 Other specified anemias: Secondary | ICD-10-CM | POA: Insufficient documentation

## 2017-03-25 LAB — CBC WITH DIFFERENTIAL/PLATELET
BASOS PCT: 0 %
Basophils Absolute: 0 10*3/uL (ref 0.0–0.1)
EOS ABS: 0.2 10*3/uL (ref 0.0–0.7)
Eosinophils Relative: 3 %
HCT: 38.1 % — ABNORMAL LOW (ref 39.0–52.0)
Hemoglobin: 12.3 g/dL — ABNORMAL LOW (ref 13.0–17.0)
Lymphocytes Relative: 20 %
Lymphs Abs: 1.5 10*3/uL (ref 0.7–4.0)
MCH: 31.2 pg (ref 26.0–34.0)
MCHC: 32.3 g/dL (ref 30.0–36.0)
MCV: 96.7 fL (ref 78.0–100.0)
MONO ABS: 0.7 10*3/uL (ref 0.1–1.0)
MONOS PCT: 10 %
Neutro Abs: 4.9 10*3/uL (ref 1.7–7.7)
Neutrophils Relative %: 67 %
Platelets: 337 10*3/uL (ref 150–400)
RBC: 3.94 MIL/uL — ABNORMAL LOW (ref 4.22–5.81)
RDW: 16.1 % — AB (ref 11.5–15.5)
WBC: 7.3 10*3/uL (ref 4.0–10.5)

## 2017-03-25 NOTE — Telephone Encounter (Signed)
Sonya from [facility] facility called with question regarding weight-bearing status.  Also, patient's visit summary does not include when patient is to return for his follow-up visit with Xrays.  Ph [phone], please ask for nurse Renae. Their department will also fax a consult sheet for Dr Aline Brochure to fill out, if helpful, as they did not send a sheet to visit today, 03/25/17.

## 2017-03-25 NOTE — Progress Notes (Signed)
Fracture care follow-up   Chief Complaint  Patient presents with  . Follow-up    ER follow up on left tibia fracture, DOI 02-15-17.   Post injury day #38  Fracture treated with  a long-leg cast  X-rays today show apex anterior angulation of the proximal tibia fracture gross alignment normal on the [facility] view  He also has distal fixation from a previous injury in the ankle and a gamma nail left femur from a previous injury as well  No orders of the defined types were placed in this encounter.   Clinical exam   Encounter Diagnosis  Name Primary?  . Closed fracture of left tibial plateau with routine healing, subsequent encounter Yes    Plan  CAST OFF   SKIN WAS INTACT  THE FRACTURE WAS NON TENDER BUT MOBILE   CYLINDER CAST APPLIED   XRAYS IN 4 WEEKS IN CAST

## 2017-03-25 NOTE — Telephone Encounter (Signed)
Relayed as noted.  Copy faxed to [facility] [phone]

## 2017-03-25 NOTE — Telephone Encounter (Signed)
Appointment completed as scheduled

## 2017-03-25 NOTE — Telephone Encounter (Signed)
Xray in cast in 4 weeks

## 2017-03-25 NOTE — Telephone Encounter (Signed)
No weightbearing,  4 weeks, xrays prior

## 2017-03-28 ENCOUNTER — Other Ambulatory Visit: Payer: Self-pay | Admitting: *Deleted

## 2017-03-28 DIAGNOSIS — S82132D Displaced fracture of medial condyle of left tibia, subsequent encounter for closed fracture with routine healing: Secondary | ICD-10-CM

## 2017-04-03 ENCOUNTER — Encounter (HOSPITAL_COMMUNITY)
Admission: RE | Admit: 2017-04-03 | Discharge: 2017-04-03 | Disposition: A | Discharge status: Home / Self Care | Payer: Medicare Other | Source: Skilled Nursing Facility | Attending: Internal Medicine | Admitting: Internal Medicine

## 2017-04-03 DIAGNOSIS — Z9181 History of falling: Secondary | ICD-10-CM | POA: Insufficient documentation

## 2017-04-03 DIAGNOSIS — X58XXXD Exposure to other specified factors, subsequent encounter: Secondary | ICD-10-CM | POA: Insufficient documentation

## 2017-04-03 DIAGNOSIS — Z7901 Long term (current) use of anticoagulants: Secondary | ICD-10-CM | POA: Diagnosis not present

## 2017-04-03 DIAGNOSIS — R634 Abnormal weight loss: Secondary | ICD-10-CM | POA: Diagnosis not present

## 2017-04-03 DIAGNOSIS — D6489 Other specified anemias: Secondary | ICD-10-CM | POA: Diagnosis not present

## 2017-04-03 DIAGNOSIS — S72141D Displaced intertrochanteric fracture of right femur, subsequent encounter for closed fracture with routine healing: Secondary | ICD-10-CM | POA: Insufficient documentation

## 2017-04-03 LAB — BASIC METABOLIC PANEL
ANION GAP: 8 (ref 5–15)
BUN: 40 mg/dL — AB (ref 6–20)
CO2: 29 mmol/L (ref 22–32)
Calcium: 8.9 mg/dL (ref 8.9–10.3)
Chloride: 101 mmol/L (ref 101–111)
Creatinine, Ser: 0.94 mg/dL (ref 0.61–1.24)
GFR calc Af Amer: >60 mL/min (ref >60)
GFR calc non Af Amer: >60 mL/min (ref >60)
GLUCOSE: 203 mg/dL — AB (ref 65–99)
POTASSIUM: 5.1 mmol/L (ref 3.5–5.1)
Sodium: 138 mmol/L (ref 135–145)

## 2017-04-07 ENCOUNTER — Encounter (HOSPITAL_COMMUNITY)
Admission: RE | Admit: 2017-04-07 | Discharge: 2017-04-07 | Disposition: A | Discharge status: Home / Self Care | Payer: Medicare Other | Source: Skilled Nursing Facility | Attending: Internal Medicine | Admitting: Internal Medicine

## 2017-04-07 DIAGNOSIS — Z7901 Long term (current) use of anticoagulants: Secondary | ICD-10-CM | POA: Diagnosis not present

## 2017-04-07 DIAGNOSIS — D6489 Other specified anemias: Secondary | ICD-10-CM | POA: Insufficient documentation

## 2017-04-07 DIAGNOSIS — Z9181 History of falling: Secondary | ICD-10-CM | POA: Insufficient documentation

## 2017-04-07 DIAGNOSIS — X58XXXD Exposure to other specified factors, subsequent encounter: Secondary | ICD-10-CM | POA: Insufficient documentation

## 2017-04-07 DIAGNOSIS — S72141D Displaced intertrochanteric fracture of right femur, subsequent encounter for closed fracture with routine healing: Secondary | ICD-10-CM | POA: Insufficient documentation

## 2017-04-07 LAB — BASIC METABOLIC PANEL
Anion gap: 6 (ref 5–15)
BUN: 33 mg/dL — AB (ref 6–20)
CHLORIDE: 102 mmol/L (ref 101–111)
CO2: 30 mmol/L (ref 22–32)
CREATININE: 0.97 mg/dL (ref 0.61–1.24)
Calcium: 8.9 mg/dL (ref 8.9–10.3)
GFR calc Af Amer: >60 mL/min (ref >60)
GFR calc non Af Amer: >60 mL/min (ref >60)
GLUCOSE: 253 mg/dL — AB (ref 65–99)
POTASSIUM: 4.4 mmol/L (ref 3.5–5.1)
Sodium: 138 mmol/L (ref 135–145)

## 2017-04-09 ENCOUNTER — Emergency Department (HOSPITAL_COMMUNITY): Payer: Medicare Other

## 2017-04-09 ENCOUNTER — Encounter (HOSPITAL_COMMUNITY): Payer: Self-pay

## 2017-04-09 ENCOUNTER — Encounter: Payer: Self-pay | Admitting: Internal Medicine

## 2017-04-09 ENCOUNTER — Encounter (HOSPITAL_COMMUNITY)
Admission: AD | Admit: 2017-04-09 | Discharge: 2017-04-09 | Disposition: A | Discharge status: Home / Self Care | Payer: Medicare Other | Source: Skilled Nursing Facility | Attending: *Deleted | Admitting: *Deleted

## 2017-04-09 ENCOUNTER — Emergency Department (HOSPITAL_COMMUNITY)
Admission: EM | Admit: 2017-04-09 | Discharge: 2017-04-09 | Disposition: A | Discharge status: Home / Self Care | Payer: Medicare Other | Attending: Emergency Medicine | Admitting: Emergency Medicine

## 2017-04-09 ENCOUNTER — Inpatient Hospital Stay
Admission: RE | Admit: 2017-04-09 | Discharge: 2017-06-03 | Disposition: A | Discharge status: Home / Self Care | Payer: Medicare Other | Source: Ambulatory Visit | Attending: Internal Medicine | Admitting: Internal Medicine

## 2017-04-09 ENCOUNTER — Non-Acute Institutional Stay (SKILLED_NURSING_FACILITY): Payer: Medicare Other | Admitting: Internal Medicine

## 2017-04-09 DIAGNOSIS — Z87891 Personal history of nicotine dependence: Secondary | ICD-10-CM | POA: Insufficient documentation

## 2017-04-09 DIAGNOSIS — Z7901 Long term (current) use of anticoagulants: Secondary | ICD-10-CM | POA: Diagnosis not present

## 2017-04-09 DIAGNOSIS — S82102G Unspecified fracture of upper end of left tibia, subsequent encounter for closed fracture with delayed healing: Secondary | ICD-10-CM | POA: Diagnosis not present

## 2017-04-09 DIAGNOSIS — S82832A Other fracture of upper and lower end of left fibula, initial encounter for closed fracture: Secondary | ICD-10-CM | POA: Diagnosis not present

## 2017-04-09 DIAGNOSIS — S82132S Displaced fracture of medial condyle of left tibia, sequela: Secondary | ICD-10-CM | POA: Diagnosis not present

## 2017-04-09 DIAGNOSIS — Z794 Long term (current) use of insulin: Secondary | ICD-10-CM | POA: Diagnosis not present

## 2017-04-09 DIAGNOSIS — S72141D Displaced intertrochanteric fracture of right femur, subsequent encounter for closed fracture with routine healing: Secondary | ICD-10-CM | POA: Diagnosis not present

## 2017-04-09 DIAGNOSIS — Z86718 Personal history of other venous thrombosis and embolism: Secondary | ICD-10-CM | POA: Diagnosis not present

## 2017-04-09 DIAGNOSIS — I129 Hypertensive chronic kidney disease with stage 1 through stage 4 chronic kidney disease, or unspecified chronic kidney disease: Secondary | ICD-10-CM | POA: Insufficient documentation

## 2017-04-09 DIAGNOSIS — S82192S Other fracture of upper end of left tibia, sequela: Secondary | ICD-10-CM | POA: Insufficient documentation

## 2017-04-09 DIAGNOSIS — R634 Abnormal weight loss: Secondary | ICD-10-CM | POA: Diagnosis not present

## 2017-04-09 DIAGNOSIS — M7989 Other specified soft tissue disorders: Secondary | ICD-10-CM | POA: Diagnosis not present

## 2017-04-09 DIAGNOSIS — E1122 Type 2 diabetes mellitus with diabetic chronic kidney disease: Secondary | ICD-10-CM | POA: Insufficient documentation

## 2017-04-09 DIAGNOSIS — X58XXXA Exposure to other specified factors, initial encounter: Secondary | ICD-10-CM | POA: Insufficient documentation

## 2017-04-09 DIAGNOSIS — D6489 Other specified anemias: Secondary | ICD-10-CM | POA: Diagnosis not present

## 2017-04-09 DIAGNOSIS — Z9181 History of falling: Secondary | ICD-10-CM | POA: Diagnosis not present

## 2017-04-09 DIAGNOSIS — Z7982 Long term (current) use of aspirin: Secondary | ICD-10-CM | POA: Diagnosis not present

## 2017-04-09 DIAGNOSIS — Z79899 Other long term (current) drug therapy: Secondary | ICD-10-CM | POA: Insufficient documentation

## 2017-04-09 DIAGNOSIS — R609 Edema, unspecified: Secondary | ICD-10-CM

## 2017-04-09 DIAGNOSIS — N183 Chronic kidney disease, stage 3 (moderate): Secondary | ICD-10-CM | POA: Diagnosis not present

## 2017-04-09 LAB — CBG MONITORING, ED: GLUCOSE-CAPILLARY: 220 mg/dL — AB (ref 65–99)

## 2017-04-09 NOTE — ED Triage Notes (Signed)
Patient fractured left lower leg two months ago and has been in a long leg cast. In rehab at [facility] and staff noticed swelling of left foot. Patient denies pain. Left foot swollen with 3+ pitting edema. Weak dorsalis pedis noted.

## 2017-04-09 NOTE — ED Provider Notes (Signed)
[facility] DEPT Provider Note   CSN: [id number] Arrival date & time: 04/09/17  1553     History   Chief Complaint Chief Complaint  Patient presents with  . Leg Swelling    HPI Eduardo Lee is a 83 y.o. male.  Asian sent in from [facility]. For concerns for swelling to the left foot area. Patient's had a tibial plateau fracture being followed by Dr. Aline Brochure. It occurred originally 2 months ago. Cast was recently changed. Patient's in a long leg cast. Patient without any specific complaints. No increase pain. Does note that there is a blood spot on the anterior part of the cast which is new      Past Medical History:  Diagnosis Date  . Diabetes mellitus   . DVT (deep venous thrombosis) ([place])   . Gastroparesis   . Hypertension   . Legally blind    diabetic retinopathy  . Neuropathy   . Osteoporosis   . Pulmonary embolism ([place]) 12/2010   on blood thinners  . Stroke ([place])   . Weight loss     Patient Active Problem List   Diagnosis Date Noted  . Klebsiella sepsis ([place]) 03/01/2017  . Closed fracture of left proximal tibia 02/28/2017  . Fracture of left proximal fibula 02/28/2017  . Poorly controlled type 2 diabetes mellitus with autonomic neuropathy ([place]) 02/27/2017  . Acute metabolic encephalopathy 85/88/5027  . Sepsis due to undetermined organism ([place]) 02/27/2017  . CKD stage 3 due to type 2 diabetes mellitus ([place]) 02/27/2017  . Urinary tract infection associated with indwelling urethral catheter ([place])   . Sepsis ([place]) 02/26/2017  . Pressure injury of skin 02/26/2017  . Acute encephalopathy 02/26/2017  . Atrial fibrillation ([place]) 02/26/2017  . History of pulmonary embolism./ DVT 02/19/2017  . Fracture, femur ([place]) 02/19/2017  . Type 1 diabetes mellitus with hyperglycemia ([place])   . Hypercholesterolemia 01/29/2017  . Diabetic peripheral neuropathy ([place]) 12/11/2014  . Chronic upper back pain 12/11/2014  . Osteoporosis, unspecified 06/23/2014  . Chronic  anticoagulation 05/26/2013  . Acute DVT (deep venous thrombosis) ([place]) 05/09/2013  . Left leg DVT ([place]) 05/07/2013  . Hypertension   . Intertrochanteric fracture of right femur ([place]) 12/09/2011  . ARF (acute renal failure) ([place]) 10/29/2011  . Leukocytosis 10/29/2011  . Normocytic anemia 10/29/2011  . Diabetic gastroparesis ([place]) 10/29/2011  . Weight loss, abnormal 10/29/2011    Past Surgical History:  Procedure Laterality Date  . ANKLE SURGERY     Patient states that he had pins place in the left foot  . BACK SURGERY  2008  . CARPAL TUNNEL RELEASE  08/2011   Left hand  . COLONOSCOPY  01/2011  . ORIF HIP FRACTURE  10/31/2011   Procedure: OPEN REDUCTION INTERNAL FIXATION HIP;  Surgeon: Arther Abbott, MD;  Location: [facility];  Service: Orthopedics;  Laterality: Right;  with Gamma Nail  . TRANSURETHRAL RESECTION OF PROSTATE  1984  . UPPER GASTROINTESTINAL ENDOSCOPY  01/2011       Home Medications    Prior to Admission medications   Medication Sig Start Date End Date Taking? Authorizing Provider  acetaminophen (TYLENOL) 325 MG tablet Take 650 mg by mouth every 4 (four) hours as needed for mild pain or moderate pain.    Yes [provider]  Amino Acids-Protein Hydrolys (FEEDING SUPPLEMENT, PRO-STAT 64,) LIQD Take 30 mLs by mouth 2 (two) times daily.   Yes [provider]  aspirin 81 MG tablet Take 81 mg by mouth every morning.  Yes [provider]  citalopram (CELEXA) 20 MG tablet Take 1 tablet (20 mg total) by mouth every morning. 11/11/16  Yes Mikey Kirschner, MD  ezetimibe (ZETIA) 10 MG tablet Take 1 tablet (10 mg total) by mouth at bedtime. 11/11/16  Yes Mikey Kirschner, MD  insulin aspart (NOVOLOG FLEXPEN) 100 UNIT/ML FlexPen Inject 2-10 Units into the skin 3 (three) times daily before meals. Less than 80-Call MD 151-200= 2 units 201-250= 4 units 251-300= 6 units 301-350= 8 units 351-400=10units>>>If greater than, call MD   Yes [provider]  latanoprost (XALATAN) 0.005 % ophthalmic solution Place 1 drop into both eyes at bedtime.    Yes [provider]  mirtazapine (REMERON) 7.5 MG tablet Take 7.5 mg by mouth at bedtime.   Yes [provider]  Multiple Vitamin (MULTIVITAMIN WITH MINERALS) TABS tablet Take 1 tablet by mouth daily. 02/21/17  Yes Isaac Bliss, Rayford Halsted, MD  omeprazole (PRILOSEC) 20 MG capsule Take 20 mg by mouth daily.   Yes [provider]  polyethylene glycol powder (GLYCOLAX/MIRALAX) powder Take 34 g by mouth every morning.   Yes [provider]  rivaroxaban (XARELTO) 20 MG TABS tablet Take 1 tablet (20 mg total) by mouth daily with supper. 11/11/16  Yes Mikey Kirschner, MD  TRESIBA FLEXTOUCH 100 UNIT/ML SOPN FlexTouch Pen Inject 12 Units into the muscle at bedtime.  02/25/17  Yes [provider]  vitamin B-12 (CYANOCOBALAMIN) 1000 MCG tablet Take 1,000 mcg by mouth daily.     Yes [provider]  Vitamin D, Ergocalciferol, (DRISDOL) 50000 UNITS CAPS Take 50,000 Units by mouth every Sunday.    Yes [provider]  Janne Lab Oil [facility]) OINT Apply to right buttocks and sacrum for prevention every shift    [provider]  collagenase (SANTYL) ointment Apply to coccyx wound per tx orders    [provider]    Family History Family History  Problem Relation Age of Onset  . Diabetes Mother   . Diabetes Father   . Diabetes Sister   . Diabetes Brother   . Diabetes Sister   . Diabetes Son   . Healthy Son   . Healthy Son     Social History Social History  Substance Use Topics  . Smoking status: Former Smoker    Years: 20.00    Types: Cigars    Quit date: 10/27/1981  . Smokeless tobacco: Never Used  . Alcohol use No     Comment: Drinks Scotch 3 times per week prior to [employer]- hasn't drank x 1 month     Allergies   Patient has no known allergies.   Review of Systems Review of Systems    Constitutional: Negative for fever.  HENT: Negative for congestion.   Eyes: Negative for redness.  Respiratory: Negative for shortness of breath.   Cardiovascular: Positive for leg swelling. Negative for chest pain.  Gastrointestinal: Negative for abdominal pain.  Genitourinary: Positive for difficulty urinating. Negative for hematuria.  Musculoskeletal: Negative for back pain.  Skin: Positive for wound.  Neurological: Negative for headaches.  Hematological: Does not bruise/bleed easily.  Psychiatric/Behavioral: Negative for confusion.     Physical Exam Updated Vital Signs BP (!) 145/71   Pulse 81   Temp 97.7 F (36.5 C) (Oral)   Resp 17   Ht 5\' 7"  (1.702 m)   Wt 54 kg   SpO2 100%   BMI 18.64 kg/m   Physical Exam  Constitutional: He is oriented  to person, place, and time. He appears well-developed and well-nourished. No distress.  HENT:  Head: Normocephalic and atraumatic.  Mouth/Throat: Oropharynx is clear and moist.  Eyes: Conjunctivae and EOM are normal. Pupils are equal, round, and reactive to light.  Neck: Normal range of motion.  Cardiovascular: Normal rate, regular rhythm and normal heart sounds.   Pulmonary/Chest: Effort normal and breath sounds normal. No respiratory distress.  Abdominal: Soft. There is no tenderness.  Musculoskeletal: Normal range of motion. He exhibits edema.  Long leg cast to the left leg. From above the knee down to the ankle area. Swelling to the left foot. Capillary Refill though is 2 seconds or less. Long leg cast is intact there is about 1 cm area of some broad tinged coming through the fiberglass. At the level of the proximal tibia. Cast is not tight. Not swollen tight around the ankles or the thigh area.  Neurological: He is alert and oriented to person, place, and time. No cranial nerve deficit or sensory deficit. He exhibits normal muscle tone. Coordination normal.  Skin: Capillary refill takes less than 2 seconds.  Nursing note and  vitals reviewed.    ED Treatments / Results  Labs (all labs ordered are listed, but only abnormal results are displayed) Labs Reviewed  CBG MONITORING, ED - Abnormal; Notable for the following:       Result Value   Glucose-Capillary 220 (*)    All other components within normal limits    EKG  EKG Interpretation None       Radiology Dg Tibia/fibula Left  Result Date: 04/09/2017 CLINICAL DATA:  Cast on fracture follow-up. Left foot swelling. Recent and proximal proximal tibia and fibular fractures. EXAM: LEFT TIBIA AND FIBULA - 2 VIEW COMPARISON:  Radiographs 03/25/2017 FINDINGS: Overlying cast material in place spanning the visualized knee through the distal lower leg. Comminuted proximal tibia fracture is unchanged in alignment. Possible minimal peripheral callus formation about the medial aspect. Fracture again noted to extend into the lateral tibial plateau. Fibular head/ neck fracture is unchanged in alignment. Overlying cast material partially obscures osseous and soft tissue fine detail. Hardware in the distal femur is partially included. Pending K-wires traverse medial and lateral malleoli, without complication. No new fracture. IMPRESSION: Unchanged alignment of comminuted proximal tibia and fibular fractures. Possible minimal callus formation about the medial tibial fracture line. Overlying cast material is in place, obscuring osseous and soft tissue fine detail. Electronically Signed   By: Jeb Levering M.D.   On: 04/09/2017 19:27   US Venous Img Lower Unilateral Left  Result Date: 04/09/2017 CLINICAL DATA:  83 year old male with left lower extremity swelling. Patient is in a cast beginning in the proximal thigh and extending to the ankle. EXAM: LEFT LOWER EXTREMITY VENOUS DOPPLER ULTRASOUND TECHNIQUE: Gray-scale sonography with graded compression, as well as color Doppler and duplex ultrasound were performed to evaluate the lower extremity deep venous systems from the level of  the common femoral vein and including the common femoral, femoral, profunda femoral, popliteal and calf veins including the posterior tibial, peroneal and gastrocnemius veins when visible. The superficial great saphenous vein was also interrogated. Spectral Doppler was utilized to evaluate flow at rest and with distal augmentation maneuvers in the common femoral, femoral and popliteal veins. COMPARISON:  None. FINDINGS: Contralateral Common Femoral Vein: Respiratory phasicity is normal and symmetric with the symptomatic side. No evidence of thrombus. Normal compressibility. Common Femoral Vein: No evidence of thrombus. Normal compressibility, respiratory phasicity and response to augmentation. Saphenofemoral  Junction: No evidence of thrombus. Normal compressibility and flow on color Doppler imaging. Profunda Femoral Vein: No evidence of thrombus. Normal compressibility and flow on color Doppler imaging. Femoral Vein: Unable to visualize Popliteal Vein: Unable to visualize Calf Veins: Unable to visualize Superficial Great Saphenous Vein: Visualized portion is patent. Venous Reflux:  None. Other Findings:  None. IMPRESSION: No evidence of acute DVT in the visualized portion of the left lower extremity venous system. Please note that the presence of the cast limits evaluation of the femoral vein in the thigh, the popliteal vein, and calf veins. Electronically Signed   By: Jacqulynn Cadet M.D.   On: 04/09/2017 17:07    Procedures Procedures (including critical care time)  Medications Ordered in ED Medications - No data to display   Initial Impression / Assessment and Plan / ED Course  I have reviewed the triage vital signs and the nursing notes.  Pertinent labs & imaging results that were available during my care of the patient were reviewed by me and considered in my medical decision making (see chart for details).    Patient from [facility]. Patient status post left tibial plateau fracture. Being  followed by Dr. Aline Brochure. Sent in from the nursing facility for concerns for swelling to the left foot. Cap refill to that foot is normal. Does have some soft tissue swelling. Cast is not tight knot tied at the ankle was not tight up with the thigh. Patient does have a bloody area on the anterior part of where the shin would be sore where the fracture should've occurred. Patient states that just showed up. Patient without any significant pain to the leg.  Doppler study showed no DVT repeat x-rays show unchanged alignment. Recommend nursing facility contact Dr. Ruthe Mannan office in the morning for follow-up either at [facility] in his office. No acute emergency requiring removal of the cast at this time.   Final Clinical Impressions(s) / ED Diagnoses   Final diagnoses:  Left leg swelling  Other closed fracture of proximal end of left tibia, sequela    New Prescriptions Discharge Medication List as of 04/09/2017  9:07 PM       Fredia Sorrow, MD 04/09/17 2133

## 2017-04-09 NOTE — ED Notes (Signed)
Dr. Elise Benne notified us results are back.

## 2017-04-09 NOTE — Progress Notes (Signed)
Location:   [facility] Room Number: 133/P Place of Service:  SNF [id number]) Provider:  Joya Gaskins, MD  Patient Care Team: Mikey Kirschner, MD as PCP - General  Extended Emergency Contact Information Primary Emergency Contact: Fretwell,Mike Address: [address] [place] [address] [place] Phone: [phone] Mobile Phone: [phone] Relation: Son Secondary Emergency Contact: Livia Snellen, [place] [address] [place] of [place] Phone: [phone] Relation: Son  Code Status:  DNR Goals of care: Advanced Directive information Advanced Directives 04/09/2017  Does Patient Have a Medical Advance Directive? Yes  Type of Advance Directive Out of facility DNR (pink MOST or yellow form)  Does patient want to make changes to medical advance directive? No - Patient declined  Copy of [place] in Chart? -  Pre-existing out of facility DNR order (yellow form or pink MOST form) -     Chief Complaint  Patient presents with  . Acute Visit    Foot edema    HPI:  Pt is a 83 y.o. male seen today for an acute visit for Apparent fairly rapid development of edema of his left foot.  He does have a history of a left tibia fracture that he sustained on 02/15/2017 in [place].  Fracture has been treated with a long leg cast he was seen by orthopedics back on April 24 and cast was replaced with a cylinder cast.  He has been quite stable since then although apparently today he had fairly quick onset of foot edema per nursing.  The edema is cool to touch does not appear to be acutely tender sensation appears to be grossly intact.  Nursing staffhas not noted any edema this significance until today.  he does have a history of DVT appears he has had DVTs in both legs in the past-- and also a history of pulmonary embolism is on Xarelto. 20 mg a day.  Vital signs are stable he is not  complaining of any shortness of breath chest pain or again any acute pain but edema is quite significant       Past Medical History:  Diagnosis Date  . Diabetes mellitus   . DVT (deep venous thrombosis) ([place])   . Gastroparesis   . Hypertension   . Legally blind    diabetic retinopathy  . Neuropathy   . Osteoporosis   . Pulmonary embolism ([place]) 12/2010   on blood thinners  . Stroke ([place])   . Weight loss    Past Surgical History:  Procedure Laterality Date  . ANKLE SURGERY     Patient states that he had pins place in the left foot  . BACK SURGERY  2008  . CARPAL TUNNEL RELEASE  08/2011   Left hand  . COLONOSCOPY  01/2011  . ORIF HIP FRACTURE  10/31/2011   Procedure: OPEN REDUCTION INTERNAL FIXATION HIP;  Surgeon: Arther Abbott, MD;  Location: [facility];  Service: Orthopedics;  Laterality: Right;  with Gamma Nail  . TRANSURETHRAL RESECTION OF PROSTATE  1984  . UPPER GASTROINTESTINAL ENDOSCOPY  01/2011    No Known Allergies  Outpatient Encounter Prescriptions as of 04/09/2017  Medication Sig  . acetaminophen (TYLENOL) 325 MG tablet Take 650 mg by mouth every 4 (four) hours as needed.  . Amino Acids-Protein Hydrolys (FEEDING SUPPLEMENT, PRO-STAT 64,) LIQD Take 30 mLs by  mouth 2 (two) times daily.  Marland Kitchen aspirin 81 MG tablet Take 81 mg by mouth every morning.   Roseanne Kaufman [place]-Castor Oil (VENELEX) OINT Apply to right buttocks and sacrum for prevention every shift  . citalopram (CELEXA) 20 MG tablet Take 1 tablet (20 mg total) by mouth every morning.  . collagenase (SANTYL) ointment Apply to coccyx wound per tx orders  . ezetimibe (ZETIA) 10 MG tablet Take 1 tablet (10 mg total) by mouth at bedtime.  . insulin aspart (NOVOLOG FLEXPEN) 100 UNIT/ML FlexPen Give per sliding scale  . latanoprost (XALATAN) 0.005 % ophthalmic solution Place 1 drop into both eyes at bedtime.   . mirtazapine (REMERON) 7.5 MG tablet Take 7.5 mg by mouth at bedtime.  . Multiple Vitamin (MULTIVITAMIN WITH  MINERALS) TABS tablet Take 1 tablet by mouth daily.  Marland Kitchen omeprazole (PRILOSEC) 20 MG capsule Take 20 mg by mouth daily.  . polyethylene glycol powder (GLYCOLAX/MIRALAX) powder Take 34 g by mouth every morning.  . rivaroxaban (XARELTO) 20 MG TABS tablet Take 1 tablet (20 mg total) by mouth daily with supper.  Tyler Aas FLEXTOUCH 100 UNIT/ML SOPN FlexTouch Pen Inject 12 Units into the muscle at bedtime.   . vitamin B-12 (CYANOCOBALAMIN) 1000 MCG tablet Take 1,000 mcg by mouth daily.    . Vitamin D, Ergocalciferol, (DRISDOL) 50000 UNITS CAPS Take 50,000 Units by mouth every 7 (seven) days.   . [DISCONTINUED] esomeprazole (NEXIUM) 40 MG capsule Take 40 mg by mouth daily as needed. Reported on 05/10/2016  . [DISCONTINUED] potassium chloride SA (K-DUR,KLOR-CON) 20 MEQ tablet Take 20 meq by mouth once a day until 03/08/17 then take 20 meq by mouth once a day   No facility-administered encounter medications on file as of 04/09/2017.     Review of Systems   In general no complaints of fever or chills. Skin does not complain of rashes or itching.  Head ears eyes nose mouth and throat no complaints of visual changes.  Respiratory is not planning of shortness of breath or cough.  Cardiac denies chest pain again he does have some edema noted of his left foot.  GI is not complaining of abdominal discomfort nausea or vomiting.  GU he does have an indwelling Foley catheter secondary to history of urinary retention does not complaining of suprapubic pain however.  Muscle skeletal does not really complain of joint pain or leg pain at this time despite the edema of his left foot.  Neurologic is not complaining of dizziness or headache says he has some chronic lower extremity numbness which is not new.  Pcyche-- has at times had periods of confusion but this appears to have resolved  Immunization History  Administered Date(s) Administered  . Influenza Split 08/31/2013  . Influenza,inj,Quad PF,36+ Mos  09/07/2014, 09/12/2015  . Influenza-Unspecified 09/02/2011, 10/02/2016  . Pneumococcal Conjugate-13 06/07/2014  . Pneumococcal Polysaccharide-23 09/01/2001  . Td 06/01/2009  . Tdap 04/26/2016  . Zoster 05/23/2009   Pertinent  Health Maintenance Due  Topic Date Due  . URINE MICROALBUMIN  02/20/1936  . OPHTHALMOLOGY EXAM  06/12/2016  . FOOT EXAM  04/08/2017  . INFLUENZA VACCINE  07/02/2017  . HEMOGLOBIN A1C  08/31/2017  . PNA vac Low Risk Adult  Completed   Fall Risk  06/10/2016  Falls in the past year? Yes  Number falls in past yr: 1  Injury with Fall? Yes  Risk Factor Category  High Fall Risk  Risk for fall due to : Impaired vision  Follow up Falls  evaluation completed;Education provided;Falls prevention discussed   Functional Status Survey:    Vitals:   04/09/17 1510  BP: 124/79  Pulse: 97  Resp: 20  Temp: 97.4 F (36.3 C)  TempSrc: Oral    Physical Exam  In general this is a frail very pleasant elderly male in no distress lying comfortably in his dairy chair.  His skin is warm and dry.  Oropharynx is clear mucous membranes moist.  Heart is regular rate and rhythm cannot really appreciate a murmur gallop or rub he does have significant edema of his left this is flesh colored pedal pulses difficult to palpate secondary to the edema the area is cool to touch-.  Chest is clear to auscultation with somewhat shallow air entry there is no labored breathing.  Abdomen is soft nontender with positive bowel sounds.  GU has an indwelling Foley catheter draining amber colored urine moderate amount.  Musculoskeletal moves upper extremities and right lower extremity at baseline does have a cylinder cast applied to his left lower extremity again has significant edema of the foot which is the only area of the leg visible outside cast-again edema is flesh-colored with difficult to palpate pedal pulse   touchsensation appears to be grossly intact although he says he has some  baseline numbness here Capillary refill appears intact   Neurologic grossly intact to speech is clear.  Psych he appears grossly alert and oriented pleasant and appropriate   Labs reviewed:  Recent Labs  02/27/17 0436  03/01/17 0626  03/10/17 0700 03/19/17 1412 04/03/17 0700 04/07/17 0700  NA 137  < > 139  < > 134* 134* 138 138  K 4.6  < > 4.0  < > 4.3 3.8 5.1 4.4  CL 97*  < > 98*  < > 97* 100* 101 102  CO2 25  < > 27  < > 22 28 29 30   GLUCOSE 334*  < > 268*  < > 328* 198* 203* 253*  BUN 24*  < > 17  < > 24* 30* 40* 33*  CREATININE 1.14  < > 0.88  < > 1.12 0.83 0.94 0.97  CALCIUM 8.3*  < > 8.1*  < > 8.0* 8.2* 8.9 8.9  MG 2.1  --  2.0  --  2.0  --   --   --   < > = values in this interval not displayed.  Recent Labs  03/03/17 1400 03/05/17 0700 03/19/17 1412  AST 17 18 19   ALT 13* 14* 11*  ALKPHOS 196* 205* 167*  BILITOT 1.9* 0.8 0.5  PROT 4.8* 5.0* 5.3*  ALBUMIN 2.3* 2.4* 2.5*    Recent Labs  03/07/17 0700 03/19/17 1412 03/25/17 0700  WBC 5.6 7.3 7.3  NEUTROABS 4.1 6.2 4.9  HGB 12.0* 9.6* 12.3*  HCT 35.8* 28.8* 38.1*  MCV 93.5 95.4 96.7  PLT 407* 303 337   No results found for: TSH Lab Results  Component Value Date   HGBA1C 7.6 (H) 02/28/2017   Lab Results  Component Value Date   CHOL 208 (H) 05/07/2016   HDL 72 05/07/2016   LDLCALC 119 (H) 05/07/2016   TRIG 86 05/07/2016   CHOLHDL 2.9 05/07/2016    Significant Diagnostic Results in last 30 days:  Dg Tibia/fibula Left  Result Date: 03/25/2017 CLINICAL DATA:  Tibia fracture EXAM: LEFT TIBIA AND FIBULA - 2 VIEW COMPARISON:  02/27/2017 FINDINGS: There is cast material which obscures the bony structures. Comminuted proximal tibia fracture is stable in position. Proximal fibular  fracture is also stable in position. Faint callus formation is suspected. Femur and patella are intact. There is metal hardware in the distal tibia and fibula. IMPRESSION: Stable alignment of the proximal tibia and fibular  fractures. Some callus formation is suspected. Electronically Signed   By: Marybelle Killings M.D.   On: 03/25/2017 09:17   Dg Femur 1v Left  Result Date: 03/25/2017 CLINICAL DATA:  Left hip fracture. EXAM: LEFT FEMUR 1 VIEW COMPARISON:  02/27/2017. FINDINGS: Patient is casted. ORIF left femur. Hardware intact. Stable alignment. Stable posttraumatic changes. Stable plateau fracture. Penile implant . Peripheral vascular calcification. IMPRESSION: 1. Patient is casted. Stable appearance of left femoral ORIF. Hardware intact . Stable alignment. Stable posttraumatic changes. Stable tibial plateau fracture. 2. Peripheral vascular disease. Electronically Signed   By: Marcello Moores  Register   On: 03/25/2017 09:21    Assessment/Plan T  #1 left foot edema fairly acute onset  New finding----it appears he's not having acute discomfort here however the edema is quite significant-secondary to recent tibia fracture-in cast as well as previous histories of DVTs and pulmonary embolism-will send him to the ER for evalution--t he does not appear to be in any distress-again he is on Xarelto with previous history of DVT and pulmonary embolism   [id number]

## 2017-04-09 NOTE — Discharge Instructions (Signed)
Doppler study of the left leg shows no deep vein thrombosis. X-ray shows slow healing. Do have some concern about the blood that showing through the cast in the foot. No acute emergency or compartment syndrome concerns tonight. However does need to follow-up with Dr. Ruthe Mannan orthopedic surgeon either him seen him at the [facility] or patient being evaluated in his office. Nursing facility should call his office tomorrow for follow-up.

## 2017-04-10 ENCOUNTER — Non-Acute Institutional Stay (SKILLED_NURSING_FACILITY): Payer: Medicare Other | Admitting: Internal Medicine

## 2017-04-10 ENCOUNTER — Encounter: Payer: Self-pay | Admitting: Internal Medicine

## 2017-04-10 DIAGNOSIS — E1065 Type 1 diabetes mellitus with hyperglycemia: Secondary | ICD-10-CM | POA: Diagnosis not present

## 2017-04-10 DIAGNOSIS — I824Y2 Acute embolism and thrombosis of unspecified deep veins of left proximal lower extremity: Secondary | ICD-10-CM | POA: Diagnosis not present

## 2017-04-10 DIAGNOSIS — M7989 Other specified soft tissue disorders: Secondary | ICD-10-CM

## 2017-04-10 DIAGNOSIS — S82102G Unspecified fracture of upper end of left tibia, subsequent encounter for closed fracture with delayed healing: Secondary | ICD-10-CM | POA: Diagnosis not present

## 2017-04-10 LAB — VITAMIN D 25 HYDROXY (VIT D DEFICIENCY, FRACTURES): VIT D 25 HYDROXY: 67.2 ng/mL (ref 30.0–100.0)

## 2017-04-10 NOTE — Progress Notes (Signed)
Location:   [facility] Room Number: 133/P Place of Service:  SNF (31) Provider:  Carrolyn Leigh, MD  Patient Care Team: Mikey Kirschner, MD as PCP - General  Extended Emergency Contact Information Primary Emergency Contact: Sedeno,Mike Address: [address] [place] [address] [place] Phone: [phone] Mobile Phone: [phone] Relation: Son Secondary Emergency Contact: Livia Snellen, [place] [address] [place] of [place] Phone: [phone] Relation: Son  Code Status:  DNR Goals of care: Advanced Directive information Advanced Directives 04/10/2017  Does Patient Have a Medical Advance Directive? Yes  Type of Advance Directive Out of facility DNR (pink MOST or yellow form)  Does patient want to make changes to medical advance directive? No - Patient declined  Copy of [place] in Chart? -  Pre-existing out of facility DNR order (yellow form or pink MOST form) -     Chief Complaint  Patient presents with  . Acute Visit    Edema to foot and F/U from ER    HPI:  Pt is a 83 y.o. male seen today for an acute visit for follow up From the ED for swelling of his Left Leg and Foot. Patient has h/o IDDM With Neuropathy,followed by Dr Altheimer, Chronic Kidney disaese Stage 3 , Hyperlipidemia, Vit D deficiency, Gastroparesis, S/P Hip fracture in 11/12 and 2014 with osteoporosis treated before, DVT with PE in 2012 on chronic Anticoagulation, Diabetic retinopathy. With  Proximal Left  Proximal fracture of Tibia after Mechanical Fall.on 03/18  He has been on long leg cast per Dr Aline Brochure and recently had new Cylinder casr placed by their office. He still is non Weight bearing. He was noticed to have swelling of that leg yesterday and was send to ED. His Xray there was negative for any acute change in Bone alignment. And Doppler US was negative for DVT. But were Unable to  Visualize Femoral and Popliteal vein well due to Cast. Patient denies any pain in that extremities. He is doing well in facility. His mood is better and he is eating well. He had lost some weight before but it is stabilizing with Remeron. Patient wants to know when he can get his Foley cathter out.   Past Medical History:  Diagnosis Date  . Diabetes mellitus   . DVT (deep venous thrombosis) ([place])   . Gastroparesis   . Hypertension   . Legally blind    diabetic retinopathy  . Neuropathy   . Osteoporosis   . Pulmonary embolism ([place]) 12/2010   on blood thinners  . Stroke ([place])   . Weight loss    Past Surgical History:  Procedure Laterality Date  . ANKLE SURGERY     Patient states that he had pins place in the left foot  . BACK SURGERY  2008  . CARPAL TUNNEL RELEASE  08/2011   Left hand  . COLONOSCOPY  01/2011  . ORIF HIP FRACTURE  10/31/2011   Procedure: OPEN REDUCTION INTERNAL FIXATION HIP;  Surgeon: Arther Abbott, MD;  Location: [facility];  Service: Orthopedics;  Laterality: Right;  with Gamma Nail  . TRANSURETHRAL RESECTION OF PROSTATE  1984  . UPPER GASTROINTESTINAL ENDOSCOPY  01/2011    No Known Allergies  Allergies as of 04/10/2017   No Known Allergies     Medication List  Accurate as of 04/10/17 10:06 AM. Always use your most recent med list.          acetaminophen 325 MG tablet Commonly known as:  TYLENOL Take 650 mg by mouth every 4 (four) hours as needed for mild pain or moderate pain.   aspirin 81 MG tablet Take 81 mg by mouth every morning.   citalopram 20 MG tablet Commonly known as:  CELEXA Take 1 tablet (20 mg total) by mouth every morning.   ezetimibe 10 MG tablet Commonly known as:  ZETIA Take 1 tablet (10 mg total) by mouth at bedtime.   feeding supplement (PRO-STAT 64) Liqd Take 30 mLs by mouth 2 (two) times daily.   latanoprost 0.005 % ophthalmic solution Commonly known as:  XALATAN Place 1 drop into both eyes at bedtime.     mirtazapine 7.5 MG tablet Commonly known as:  REMERON Take 7.5 mg by mouth at bedtime.   multivitamin with minerals Tabs tablet Take 1 tablet by mouth daily.   NOVOLOG FLEXPEN 100 UNIT/ML FlexPen Generic drug:  insulin aspart Inject 2-10 Units into the skin 3 (three) times daily before meals. Less than 80-Call MD 151-200= 2 units 201-250= 4 units 251-300= 6 units 301-350= 8 units 351-400=10units>>>If greater than, call MD   omeprazole 20 MG capsule Commonly known as:  PRILOSEC Take 20 mg by mouth daily.   polyethylene glycol powder powder Commonly known as:  GLYCOLAX/MIRALAX Take 34 g by mouth every morning.   rivaroxaban 20 MG Tabs tablet Commonly known as:  XARELTO Take 1 tablet (20 mg total) by mouth daily with supper.   SANTYL ointment Generic drug:  collagenase Apply to coccyx wound per tx orders   TRESIBA FLEXTOUCH 100 UNIT/ML Sopn FlexTouch Pen Generic drug:  insulin degludec Inject 12 Units into the muscle at bedtime.   VENELEX Oint Apply to right buttocks and sacrum for prevention every shift   vitamin B-12 1000 MCG tablet Commonly known as:  CYANOCOBALAMIN Take 1,000 mcg by mouth daily.   Vitamin D (Ergocalciferol) 50000 units Caps capsule Commonly known as:  DRISDOL Take 50,000 Units by mouth every Sunday.       Review of Systems  Review of Systems  Constitutional: Negative for activity change, appetite change, chills, diaphoresis, fatigue and fever.  HENT: Negative for mouth sores, postnasal drip, rhinorrhea, sinus pain and sore throat.   Respiratory: Negative for apnea, cough, chest tightness, shortness of breath and wheezing.   Cardiovascular: Negative for chest pain, palpitations and leg swelling.  Gastrointestinal: Negative for abdominal distention, abdominal pain, constipation, diarrhea, nausea and vomiting.  Genitourinary: Negative for dysuria and frequency.  Musculoskeletal: Negative for arthralgias, joint swelling and myalgias.  Skin:  Negative for rash.  Neurological: Negative for dizziness, syncope, weakness, light-headedness and numbness.  Psychiatric/Behavioral: Negative for behavioral problems, confusion and sleep disturbance.     Immunization History  Administered Date(s) Administered  . Influenza Split 08/31/2013  . Influenza,inj,Quad PF,36+ Mos 09/07/2014, 09/12/2015  . Influenza-Unspecified 09/02/2011, 10/02/2016  . Pneumococcal Conjugate-13 06/07/2014  . Pneumococcal Polysaccharide-23 09/01/2001  . Td 06/01/2009  . Tdap 04/26/2016  . Zoster 05/23/2009   Pertinent  Health Maintenance Due  Topic Date Due  . URINE MICROALBUMIN  02/20/1936  . OPHTHALMOLOGY EXAM  06/12/2016  . FOOT EXAM  04/08/2017  . INFLUENZA VACCINE  07/02/2017  . HEMOGLOBIN A1C  08/31/2017  . PNA vac Low Risk Adult  Completed   Fall Risk  06/10/2016  Falls in the past year? Yes  Number falls  in past yr: 1  Injury with Fall? Yes  Risk Factor Category  High Fall Risk  Risk for fall due to : Impaired vision  Follow up Falls evaluation completed;Education provided;Falls prevention discussed   Functional Status Survey:    Vitals:   04/10/17 1005  BP: (!) 96/54  Pulse: 92  Resp: 20  Temp: 98.5 F (36.9 C)  TempSrc: Oral   There is no height or weight on file to calculate BMI. Physical Exam  Constitutional: He is oriented to person, place, and time. He appears well-developed.  HENT:  Head: Normocephalic.  Mouth/Throat: Oropharynx is clear and moist.  Eyes: Pupils are equal, round, and reactive to light.  Neck: Neck supple.  Cardiovascular: Normal rate and normal heart sounds.   Pulmonary/Chest: Effort normal and breath sounds normal. No respiratory distress. He has no wheezes. He has no rales.  Abdominal: Soft. Bowel sounds are normal. He exhibits no distension. There is no tenderness. There is no rebound.  Musculoskeletal:  Has Definite Swelling of Left Foot.Good pulses with no tenderness. Right LE no swelling.    Neurological: He is alert and oriented to person, place, and time.  Skin: Skin is warm and dry. No rash noted. No erythema.  Psychiatric: He has a normal mood and affect. His behavior is normal. Thought content normal.    Labs reviewed:  Recent Labs  02/27/17 0436  03/01/17 0626  03/10/17 0700 03/19/17 1412 04/03/17 0700 04/07/17 0700  NA 137  < > 139  < > 134* 134* 138 138  K 4.6  < > 4.0  < > 4.3 3.8 5.1 4.4  CL 97*  < > 98*  < > 97* 100* 101 102  CO2 25  < > 27  < > 22 28 29 30   GLUCOSE 334*  < > 268*  < > 328* 198* 203* 253*  BUN 24*  < > 17  < > 24* 30* 40* 33*  CREATININE 1.14  < > 0.88  < > 1.12 0.83 0.94 0.97  CALCIUM 8.3*  < > 8.1*  < > 8.0* 8.2* 8.9 8.9  MG 2.1  --  2.0  --  2.0  --   --   --   < > = values in this interval not displayed.  Recent Labs  03/03/17 1400 03/05/17 0700 03/19/17 1412  AST 17 18 19   ALT 13* 14* 11*  ALKPHOS 196* 205* 167*  BILITOT 1.9* 0.8 0.5  PROT 4.8* 5.0* 5.3*  ALBUMIN 2.3* 2.4* 2.5*    Recent Labs  03/07/17 0700 03/19/17 1412 03/25/17 0700  WBC 5.6 7.3 7.3  NEUTROABS 4.1 6.2 4.9  HGB 12.0* 9.6* 12.3*  HCT 35.8* 28.8* 38.1*  MCV 93.5 95.4 96.7  PLT 407* 303 337   No results found for: TSH Lab Results  Component Value Date   HGBA1C 7.6 (H) 02/28/2017   Lab Results  Component Value Date   CHOL 208 (H) 05/07/2016   HDL 72 05/07/2016   LDLCALC 119 (H) 05/07/2016   TRIG 86 05/07/2016   CHOLHDL 2.9 05/07/2016    Significant Diagnostic Results in last 30 days:  Dg Tibia/fibula Left  Result Date: 04/09/2017 CLINICAL DATA:  Cast on fracture follow-up. Left foot swelling. Recent and proximal proximal tibia and fibular fractures. EXAM: LEFT TIBIA AND FIBULA - 2 VIEW COMPARISON:  Radiographs 03/25/2017 FINDINGS: Overlying cast material in place spanning the visualized knee through the distal lower leg. Comminuted proximal tibia fracture is unchanged in alignment. Possible  minimal peripheral callus formation about the  medial aspect. Fracture again noted to extend into the lateral tibial plateau. Fibular head/ neck fracture is unchanged in alignment. Overlying cast material partially obscures osseous and soft tissue fine detail. Hardware in the distal femur is partially included. Pending K-wires traverse medial and lateral malleoli, without complication. No new fracture. IMPRESSION: Unchanged alignment of comminuted proximal tibia and fibular fractures. Possible minimal callus formation about the medial tibial fracture line. Overlying cast material is in place, obscuring osseous and soft tissue fine detail. Electronically Signed   By: Jeb Levering M.D.   On: 04/09/2017 19:27   Dg Tibia/fibula Left  Result Date: 03/25/2017 CLINICAL DATA:  Tibia fracture EXAM: LEFT TIBIA AND FIBULA - 2 VIEW COMPARISON:  02/27/2017 FINDINGS: There is cast material which obscures the bony structures. Comminuted proximal tibia fracture is stable in position. Proximal fibular fracture is also stable in position. Faint callus formation is suspected. Femur and patella are intact. There is metal hardware in the distal tibia and fibula. IMPRESSION: Stable alignment of the proximal tibia and fibular fractures. Some callus formation is suspected. Electronically Signed   By: Marybelle Killings M.D.   On: 03/25/2017 09:17   US Venous Img Lower Unilateral Left  Result Date: 04/09/2017 CLINICAL DATA:  83 year old male with left lower extremity swelling. Patient is in a cast beginning in the proximal thigh and extending to the ankle. EXAM: LEFT LOWER EXTREMITY VENOUS DOPPLER ULTRASOUND TECHNIQUE: Gray-scale sonography with graded compression, as well as color Doppler and duplex ultrasound were performed to evaluate the lower extremity deep venous systems from the level of the common femoral vein and including the common femoral, femoral, profunda femoral, popliteal and calf veins including the posterior tibial, peroneal and gastrocnemius veins when visible.  The superficial great saphenous vein was also interrogated. Spectral Doppler was utilized to evaluate flow at rest and with distal augmentation maneuvers in the common femoral, femoral and popliteal veins. COMPARISON:  None. FINDINGS: Contralateral Common Femoral Vein: Respiratory phasicity is normal and symmetric with the symptomatic side. No evidence of thrombus. Normal compressibility. Common Femoral Vein: No evidence of thrombus. Normal compressibility, respiratory phasicity and response to augmentation. Saphenofemoral Junction: No evidence of thrombus. Normal compressibility and flow on color Doppler imaging. Profunda Femoral Vein: No evidence of thrombus. Normal compressibility and flow on color Doppler imaging. Femoral Vein: Unable to visualize Popliteal Vein: Unable to visualize Calf Veins: Unable to visualize Superficial Great Saphenous Vein: Visualized portion is patent. Venous Reflux:  None. Other Findings:  None. IMPRESSION: No evidence of acute DVT in the visualized portion of the left lower extremity venous system. Please note that the presence of the cast limits evaluation of the femoral vein in the thigh, the popliteal vein, and calf veins. Electronically Signed   By: Jacqulynn Cadet M.D.   On: 04/09/2017 17:07   Dg Femur 1v Left  Result Date: 03/25/2017 CLINICAL DATA:  Left hip fracture. EXAM: LEFT FEMUR 1 VIEW COMPARISON:  02/27/2017. FINDINGS: Patient is casted. ORIF left femur. Hardware intact. Stable alignment. Stable posttraumatic changes. Stable plateau fracture. Penile implant . Peripheral vascular calcification. IMPRESSION: 1. Patient is casted. Stable appearance of left femoral ORIF. Hardware intact . Stable alignment. Stable posttraumatic changes. Stable tibial plateau fracture. 2. Peripheral vascular disease. Electronically Signed   By: Marcello Moores  Register   On: 03/25/2017 09:21    Assessment/Plan Left leg swelling Patient has appointment with Dr Aline Brochure tomorrow. Will wait for  his input Patient on Xarelto already for h/o  DVT  Type 1 diabetes mellitus  His BS are running higher in afternoon and  evening. Sometimes goin gover 400 in evening. But in morning he is running low with BS of 65 to 180 So Will not increase [place] but will tighten his sliding scale. Also repeat A1 C Indwelling cathter Do not see if patient has seen Urologist yet He did have Urinary retention and has needed Cathter in facility and since he is more awake and doing well will need revaluation.  H/o DVT and PE On Chronic Xarelto   Family/ staff Communication:   Labs/tests ordered:  Bmp, CBC A1C in 1 week

## 2017-04-11 ENCOUNTER — Ambulatory Visit (INDEPENDENT_AMBULATORY_CARE_PROVIDER_SITE_OTHER): Payer: Self-pay | Admitting: Orthopedic Surgery

## 2017-04-11 ENCOUNTER — Encounter: Payer: Self-pay | Admitting: Orthopedic Surgery

## 2017-04-11 DIAGNOSIS — S82142D Displaced bicondylar fracture of left tibia, subsequent encounter for closed fracture with routine healing: Secondary | ICD-10-CM

## 2017-04-11 DIAGNOSIS — L89891 Pressure ulcer of other site, stage 1: Secondary | ICD-10-CM

## 2017-04-11 MED ORDER — CEPHALEXIN 500 MG PO CAPS: 500.0000 mg | ORAL_CAPSULE | Freq: Four times a day (QID) | ORAL | 0 refills | Status: AC

## 2017-04-11 NOTE — Progress Notes (Signed)
Chief Complaint  Patient presents with  . Leg Problem    recheck cast   The patient is post injury day #55 4 week #8  He was initially placed in a long-leg cast  On April 24 we placed him in a cylinder cast  He presented to the ER for swelling of his left foot and questionable sore under the cast.  He was worked up for DVT found to be negative although study was inconclusive due to the cast being on the leg  TODAY WE SEE: Swelling of the foot but no swelling above the ankle. I can get 2 fingers well under the cast  However over the apex of the fracture site we do see drainage so we windowed the cast and there is a superficial grade 1 pressure sore there. This was cleaned with alcohol dressed sterilely and I will start him on Keflex 500 mg 3 times a day and have him come back in a week for a check. He is to have the dressing changed once a day as well at the nursing home  Encounter Diagnoses  Name Primary?  . Closed fracture of left tibial plateau with routine healing, subsequent encounter Yes  . Decubitus ulcer of other site, stage 1

## 2017-04-11 NOTE — Addendum Note (Signed)
Addended by: Baldomero Lamy B on: 04/11/2017 01:02 PM   Modules accepted: Orders

## 2017-04-18 ENCOUNTER — Encounter: Payer: Self-pay | Admitting: Orthopedic Surgery

## 2017-04-18 ENCOUNTER — Ambulatory Visit (INDEPENDENT_AMBULATORY_CARE_PROVIDER_SITE_OTHER): Payer: Self-pay | Admitting: Orthopedic Surgery

## 2017-04-18 DIAGNOSIS — L89891 Pressure ulcer of other site, stage 1: Secondary | ICD-10-CM

## 2017-04-18 DIAGNOSIS — S82142D Displaced bicondylar fracture of left tibia, subsequent encounter for closed fracture with routine healing: Secondary | ICD-10-CM

## 2017-04-18 NOTE — Progress Notes (Signed)
Chief Complaint  Patient presents with  . Follow-up    1 week recheck on wound, DOI 02-15-17.    This is a follow-up visit to check a wound under a cast which was windowed. He is on oral antibiotics and he has no pain and swelling went down  Recommend continue current antibiotics and wound care follow-up in 2 weeks for x-ray in the cast and wound check

## 2017-04-21 ENCOUNTER — Non-Acute Institutional Stay (SKILLED_NURSING_FACILITY): Payer: Medicare Other | Admitting: Internal Medicine

## 2017-04-21 ENCOUNTER — Encounter: Payer: Self-pay | Admitting: Internal Medicine

## 2017-04-21 DIAGNOSIS — E1065 Type 1 diabetes mellitus with hyperglycemia: Secondary | ICD-10-CM

## 2017-04-21 DIAGNOSIS — R600 Localized edema: Secondary | ICD-10-CM | POA: Diagnosis not present

## 2017-04-21 DIAGNOSIS — Z9289 Personal history of other medical treatment: Secondary | ICD-10-CM

## 2017-04-21 DIAGNOSIS — Z978 Presence of other specified devices: Secondary | ICD-10-CM

## 2017-04-21 DIAGNOSIS — Z960 Presence of urogenital implants: Principal | ICD-10-CM

## 2017-04-21 DIAGNOSIS — R339 Retention of urine, unspecified: Secondary | ICD-10-CM | POA: Diagnosis not present

## 2017-04-21 NOTE — Progress Notes (Signed)
Location:   [facility] Room Number: 133/P Place of Service:  SNF [phone]) Provider:  Joya Gaskins, MD  Patient Care Team: Mikey Kirschner, MD as PCP - General  Extended Emergency Contact Information Primary Emergency Contact: Dimock,Mike Address: [address] [place] [address] [place] Phone: [phone] Mobile Phone: [phone] Relation: Son Secondary Emergency Contact: Livia Snellen, [place] [address] [place] of [place] Phone: [phone] Relation: Son  Code Status:  DNR Goals of care: Advanced Directive information Advanced Directives 04/21/2017  Does Patient Have a Medical Advance Directive? Yes  Type of Advance Directive Out of facility DNR (pink MOST or yellow form)  Does patient want to make changes to medical advance directive? No - Patient declined  Copy of [place] in Chart? -  Pre-existing out of facility DNR order (yellow form or pink MOST form) -     Chief Complaint  Patient presents with  . Acute Visit    Urinary Rentention    HPI:  Pt is a 83 y.o. male seen today for an acute visit for Follow-up of urinary retention with history of chronic indwelling Foley catheter.  .Patient has h/o IDDM With Neuropathy,followed by Dr Altheimer, Chronic Kidney disaese Stage 3 , Hyperlipidemia, Vit D deficiency, Gastroparesis, S/P Hip fracture in 11/12 and 2014 with osteoporosis treated before, DVT with PE in 2012 on chronic Anticoagulation, Diabetic retinopathy. With  Proximal Left Proximal fracture of Tibia after Mechanical Fall.on 03/18  He is currently in a cylinder cast which was windowed secondary to concerns of possible cellulitis he did have a pressure ulcer noted by Dr. Aline Brochure of orthopedics and has been started on Keflex.  He also did have increase swelling of his left leg at this was dopplered in the ER and no DVT was found-the swelling has  improved.  Regards T the catheter he does have a history of urinary retention ever since his fracture it appears- he would really like to have this removed if possible.  A urology consult is pending.           Past Medical History:  Diagnosis Date  . Diabetes mellitus   . DVT (deep venous thrombosis) ([facility])   . Gastroparesis   . Hypertension   . Legally blind    diabetic retinopathy  . Neuropathy   . Osteoporosis   . Pulmonary embolism ([facility]) 12/2010   on blood thinners  . Stroke ([facility])   . Weight loss    Past Surgical History:  Procedure Laterality Date  . ANKLE SURGERY     Patient states that he had pins place in the left foot  . BACK SURGERY  2008  . CARPAL TUNNEL RELEASE  08/2011   Left hand  . COLONOSCOPY  01/2011  . ORIF HIP FRACTURE  10/31/2011   Procedure: OPEN REDUCTION INTERNAL FIXATION HIP;  Surgeon: Arther Abbott, MD;  Location: [facility];  Service: Orthopedics;  Laterality: Right;  with Gamma Nail  . TRANSURETHRAL RESECTION OF PROSTATE  1984  . UPPER GASTROINTESTINAL ENDOSCOPY  01/2011    No Known Allergies  Outpatient Encounter Prescriptions as of 04/21/2017  Medication Sig  . acetaminophen (TYLENOL) 325 MG tablet Take 650 mg by mouth every 4 (four) hours as needed for mild pain or moderate pain.   . Amino Acids-Protein Hydrolys (FEEDING SUPPLEMENT, PRO-STAT 64,)  LIQD Take 30 mLs by mouth 2 (two) times daily.  Marland Kitchen aspirin 81 MG tablet Take 81 mg by mouth every morning.   Roseanne Kaufman [place]-Castor Oil (VENELEX) OINT Apply to right buttocks and sacrum for prevention every shift  . cephALEXin (KEFLEX) 500 MG capsule Take 1 capsule (500 mg total) by mouth every 6 (six) hours.  . citalopram (CELEXA) 20 MG tablet Take 1 tablet (20 mg total) by mouth every morning.  . collagenase (SANTYL) ointment Apply to coccyx wound per tx orders  . ezetimibe (ZETIA) 10 MG tablet Take 1 tablet (10 mg total) by mouth at bedtime.  . insulin aspart (NOVOLOG FLEXPEN) 100 UNIT/ML  FlexPen Inject 2-10 Units into the skin 3 (three) times daily before meals. Less than 80-Call MD 151-200= 2 units 201-250= 4 units 251-300= 6 units 301-350= 8 units 351-400=10units>>>If greater than, call MD  . latanoprost (XALATAN) 0.005 % ophthalmic solution Place 1 drop into both eyes at bedtime.   . mirtazapine (REMERON) 7.5 MG tablet Take 7.5 mg by mouth at bedtime.  . Multiple Vitamin (MULTIVITAMIN WITH MINERALS) TABS tablet Take 1 tablet by mouth daily.  Marland Kitchen omeprazole (PRILOSEC) 20 MG capsule Take 20 mg by mouth daily.  . polyethylene glycol powder (GLYCOLAX/MIRALAX) powder Take 34 g by mouth every morning.  . rivaroxaban (XARELTO) 20 MG TABS tablet Take 1 tablet (20 mg total) by mouth daily with supper.  Tyler Aas FLEXTOUCH 100 UNIT/ML SOPN FlexTouch Pen Inject 12 Units into the muscle at bedtime.   . vitamin B-12 (CYANOCOBALAMIN) 1000 MCG tablet Take 1,000 mcg by mouth daily.    . Vitamin D, Ergocalciferol, (DRISDOL) 50000 UNITS CAPS Take 50,000 Units by mouth every Sunday.    No facility-administered encounter medications on file as of 04/21/2017.     Review of Systems  General is not complaining of any fever or chills says he feels a bit stronger.  Skin does not complain of rashes or itching is being treated for suspected cellulitis left lower extremity with the pressure ulcer.  Head ears eyes nose mouth and throat does not complaining of of any visual changes or sore throat.  OA does not complaining any shortness breath or cough.  Cardiac does not complain of chest pain leg swelling on left appears to have improved.  GI is not complaining of abdominal discomfort nausea vomiting diarrhea constipation.  GU is not playing of dysuria does have an indwelling Foley catheter that he would like removed with a history of urinary retention of recent onset.  Musculoskeletal does have a left tibial fracture again currently has a cylinder cast followed by orthopedics.  Neurologic is  not complaining of dizziness headache or numbness.  In psych does not complain of overt depression or anxiety.     Immunization History  Administered Date(s) Administered  . Influenza Split 08/31/2013  . Influenza,inj,Quad PF,36+ Mos 09/07/2014, 09/12/2015  . Influenza-Unspecified 09/02/2011, 10/02/2016  . Pneumococcal Conjugate-13 06/07/2014  . Pneumococcal Polysaccharide-23 09/01/2001  . Td 06/01/2009  . Tdap 04/26/2016  . Zoster 05/23/2009   Pertinent  Health Maintenance Due  Topic Date Due  . URINE MICROALBUMIN  02/20/1936  . OPHTHALMOLOGY EXAM  06/12/2016  . FOOT EXAM  04/08/2017  . INFLUENZA VACCINE  07/02/2017  . HEMOGLOBIN A1C  08/31/2017  . PNA vac Low Risk Adult  Completed   Fall Risk  06/10/2016  Falls in the past year? Yes  Number falls in past yr: 1  Injury with Fall? Yes  Risk Factor Category  High Fall Risk  Risk for fall due to : Impaired vision  Follow up Falls evaluation completed;Education provided;Falls prevention discussed   Functional Status Survey:    Vitals:   04/21/17 1605  BP: (!) 95/50  Pulse: 77  Resp: 18  Temp: 97.6 F (36.4 C)  TempSrc: Oral  Note  manual blood pressure was 120/60  Physical Exam   In general this is a frail very pleasant elderly male in no distress lying comfortably in bed  His skin is warm and dry.  Oropharynx is clear mucous membranes moist.  Heart is regular rate and rhythm cannot really appreciate a murmur gallop or rub he does have  edema of his left foot but this is improved significantly from when I last saw  it-.  Chest is clear to auscultation with somewhat shallow air entry there is no labored breathing.  Abdomen is soft nontender with positive bowel sounds.  GU has an indwelling Foley catheter draining amber colored urine moderate amount. Could not appreciate any suprapubic tenderness or distention  Musculoskeletal moves upper extremities and right lower extremity at baseline does have a  cylinder cast applied to his left lower extremity Edema of the left foot appears improved from when I last saw it    Neurologic grossly intact to speech is clear.  Psych he appears grossly alert and oriented pleasant and appropriate  Labs reviewed:  Recent Labs  02/27/17 0436  03/01/17 0626  03/10/17 0700 03/19/17 1412 04/03/17 0700 04/07/17 0700  NA 137  < > 139  < > 134* 134* 138 138  K 4.6  < > 4.0  < > 4.3 3.8 5.1 4.4  CL 97*  < > 98*  < > 97* 100* 101 102  CO2 25  < > 27  < > 22 28 29 30   GLUCOSE 334*  < > 268*  < > 328* 198* 203* 253*  BUN 24*  < > 17  < > 24* 30* 40* 33*  CREATININE 1.14  < > 0.88  < > 1.12 0.83 0.94 0.97  CALCIUM 8.3*  < > 8.1*  < > 8.0* 8.2* 8.9 8.9  MG 2.1  --  2.0  --  2.0  --   --   --   < > = values in this interval not displayed.  Recent Labs  03/03/17 1400 03/05/17 0700 03/19/17 1412  AST 17 18 19   ALT 13* 14* 11*  ALKPHOS 196* 205* 167*  BILITOT 1.9* 0.8 0.5  PROT 4.8* 5.0* 5.3*  ALBUMIN 2.3* 2.4* 2.5*    Recent Labs  03/07/17 0700 03/19/17 1412 03/25/17 0700  WBC 5.6 7.3 7.3  NEUTROABS 4.1 6.2 4.9  HGB 12.0* 9.6* 12.3*  HCT 35.8* 28.8* 38.1*  MCV 93.5 95.4 96.7  PLT 407* 303 337   No results found for: TSH Lab Results  Component Value Date   HGBA1C 7.6 (H) 02/28/2017   Lab Results  Component Value Date   CHOL 208 (H) 05/07/2016   HDL 72 05/07/2016   LDLCALC 119 (H) 05/07/2016   TRIG 86 05/07/2016   CHOLHDL 2.9 05/07/2016    Significant Diagnostic Results in last 30 days:  Dg Tibia/fibula Left  Result Date: 04/09/2017 CLINICAL DATA:  Cast on fracture follow-up. Left foot swelling. Recent and proximal proximal tibia and fibular fractures. EXAM: LEFT TIBIA AND FIBULA - 2 VIEW COMPARISON:  Radiographs 03/25/2017 FINDINGS: Overlying cast material in place spanning the visualized knee through the distal lower leg. Comminuted proximal  tibia fracture is unchanged in alignment. Possible minimal peripheral callus  formation about the medial aspect. Fracture again noted to extend into the lateral tibial plateau. Fibular head/ neck fracture is unchanged in alignment. Overlying cast material partially obscures osseous and soft tissue fine detail. Hardware in the distal femur is partially included. Pending K-wires traverse medial and lateral malleoli, without complication. No new fracture. IMPRESSION: Unchanged alignment of comminuted proximal tibia and fibular fractures. Possible minimal callus formation about the medial tibial fracture line. Overlying cast material is in place, obscuring osseous and soft tissue fine detail. Electronically Signed   By: Jeb Levering M.D.   On: 04/09/2017 19:27   Dg Tibia/fibula Left  Result Date: 03/25/2017 CLINICAL DATA:  Tibia fracture EXAM: LEFT TIBIA AND FIBULA - 2 VIEW COMPARISON:  02/27/2017 FINDINGS: There is cast material which obscures the bony structures. Comminuted proximal tibia fracture is stable in position. Proximal fibular fracture is also stable in position. Faint callus formation is suspected. Femur and patella are intact. There is metal hardware in the distal tibia and fibula. IMPRESSION: Stable alignment of the proximal tibia and fibular fractures. Some callus formation is suspected. Electronically Signed   By: Marybelle Killings M.D.   On: 03/25/2017 09:17   US Venous Img Lower Unilateral Left  Result Date: 04/09/2017 CLINICAL DATA:  83 year old male with left lower extremity swelling. Patient is in a cast beginning in the proximal thigh and extending to the ankle. EXAM: LEFT LOWER EXTREMITY VENOUS DOPPLER ULTRASOUND TECHNIQUE: Gray-scale sonography with graded compression, as well as color Doppler and duplex ultrasound were performed to evaluate the lower extremity deep venous systems from the level of the common femoral vein and including the common femoral, femoral, profunda femoral, popliteal and calf veins including the posterior tibial, peroneal and gastrocnemius  veins when visible. The superficial great saphenous vein was also interrogated. Spectral Doppler was utilized to evaluate flow at rest and with distal augmentation maneuvers in the common femoral, femoral and popliteal veins. COMPARISON:  None. FINDINGS: Contralateral Common Femoral Vein: Respiratory phasicity is normal and symmetric with the symptomatic side. No evidence of thrombus. Normal compressibility. Common Femoral Vein: No evidence of thrombus. Normal compressibility, respiratory phasicity and response to augmentation. Saphenofemoral Junction: No evidence of thrombus. Normal compressibility and flow on color Doppler imaging. Profunda Femoral Vein: No evidence of thrombus. Normal compressibility and flow on color Doppler imaging. Femoral Vein: Unable to visualize Popliteal Vein: Unable to visualize Calf Veins: Unable to visualize Superficial Great Saphenous Vein: Visualized portion is patent. Venous Reflux:  None. Other Findings:  None. IMPRESSION: No evidence of acute DVT in the visualized portion of the left lower extremity venous system. Please note that the presence of the cast limits evaluation of the femoral vein in the thigh, the popliteal vein, and calf veins. Electronically Signed   By: Jacqulynn Cadet M.D.   On: 04/09/2017 17:07   Dg Femur 1v Left  Result Date: 03/25/2017 CLINICAL DATA:  Left hip fracture. EXAM: LEFT FEMUR 1 VIEW COMPARISON:  02/27/2017. FINDINGS: Patient is casted. ORIF left femur. Hardware intact. Stable alignment. Stable posttraumatic changes. Stable plateau fracture. Penile implant . Peripheral vascular calcification. IMPRESSION: 1. Patient is casted. Stable appearance of left femoral ORIF. Hardware intact . Stable alignment. Stable posttraumatic changes. Stable tibial plateau fracture. 2. Peripheral vascular disease. Electronically Signed   By: Marcello Moores  Register   On: 03/25/2017 09:21    Assessment/Plan  #1-history urinary retention with indwelling Foley  catheter-this was discussed with Dr. Lyndel Safe patient  really would like this re moved-we'll do a trial course of removing the catheter and monitor urinary output closely-he does have a urology consult pending.  Also will start Flomax 0.4 mg daily-hold for systolic blood pressure less than 105-per review of blood pressures I don't really see systolics under 927 except for reading today which was done I suspect by machine manually it was more reassuring at 120/60 but this will have to be watched-  #2 history diabetes type 1 he is on a sliding scale which was tightened up recently by Dr. Elayne Snare  has been increased slightly up to 12 units daily-sugars appear to be somewhat variable I see readings ranging from 70 to at times in the high 200s in the morning-later in the day continues variability with read seengs from the lower 100s up to the 300 range actually see 4 50  at night recently as well-consider increasing the [place]  b slightly but will discuss this with Dr. Lyndel Safe again would like to avoid hypoglycemia   #3 left lower extremity edema again Doppler was negative he has been evaluated by orthopedics edema appears to be significantly improved [id number]

## 2017-04-22 ENCOUNTER — Ambulatory Visit: Payer: Medicare Other | Admitting: Orthopedic Surgery

## 2017-05-02 ENCOUNTER — Inpatient Hospital Stay (INDEPENDENT_AMBULATORY_CARE_PROVIDER_SITE_OTHER): Payer: Medicare Other

## 2017-05-02 ENCOUNTER — Encounter: Payer: Self-pay | Admitting: Orthopedic Surgery

## 2017-05-02 ENCOUNTER — Ambulatory Visit (INDEPENDENT_AMBULATORY_CARE_PROVIDER_SITE_OTHER): Payer: Self-pay | Admitting: Orthopedic Surgery

## 2017-05-02 DIAGNOSIS — S82202D Unspecified fracture of shaft of left tibia, subsequent encounter for closed fracture with routine healing: Secondary | ICD-10-CM

## 2017-05-02 DIAGNOSIS — S82142D Displaced bicondylar fracture of left tibia, subsequent encounter for closed fracture with routine healing: Secondary | ICD-10-CM

## 2017-05-02 DIAGNOSIS — S82402D Unspecified fracture of shaft of left fibula, subsequent encounter for closed fracture with routine healing: Secondary | ICD-10-CM

## 2017-05-02 NOTE — Progress Notes (Signed)
Fracture care follow-up  Encounter Diagnosis  Name Primary?  . Closed fracture of left tibia and fibula with routine healing, subsequent encounter Yes    Chief Complaint  Patient presents with  . Follow-up    Recheck on left tibia fibula fracture, DOI 02-15-17.   Wound Is clean.  The x-ray shows that the fracture is healing with some impaction and callus  Recommend x-ray out of plaster 2 weeks conversion to T score brace  Patient start weightbearing as tolerated with physical therapy to assist

## 2017-05-12 ENCOUNTER — Ambulatory Visit: Payer: Medicare Other | Admitting: Family Medicine

## 2017-05-16 ENCOUNTER — Ambulatory Visit (INDEPENDENT_AMBULATORY_CARE_PROVIDER_SITE_OTHER): Payer: Self-pay | Admitting: Orthopedic Surgery

## 2017-05-16 ENCOUNTER — Inpatient Hospital Stay (INDEPENDENT_AMBULATORY_CARE_PROVIDER_SITE_OTHER): Payer: Medicare Other

## 2017-05-16 ENCOUNTER — Encounter: Payer: Self-pay | Admitting: Orthopedic Surgery

## 2017-05-16 DIAGNOSIS — S82202D Unspecified fracture of shaft of left tibia, subsequent encounter for closed fracture with routine healing: Secondary | ICD-10-CM

## 2017-05-16 DIAGNOSIS — S82402D Unspecified fracture of shaft of left fibula, subsequent encounter for closed fracture with routine healing: Secondary | ICD-10-CM

## 2017-05-16 NOTE — Progress Notes (Signed)
Fracture care follow-up  Chief Complaint  Patient presents with  . Follow-up    Recheck on tibia fibula fracture, DOI 02-15-17    Post injury day number  # 48 DOI: (02/15/17)  Fracture treated with  a long-leg cast and window for grade 1 abrasion/cast  X-rays today show fracture healing 90% with mild apex anterior angulation  Clinical exam  No tenderness or motion at the fracture sire and the skin has healed where the cast sore was   Encounter Diagnosis  Name Primary?  . Closed fracture of left tibia and fibula with routine healing, subsequent encounter Yes    Plan  t scope brace  wbat     Return xrays in 5 weeks

## 2017-05-19 ENCOUNTER — Non-Acute Institutional Stay (SKILLED_NURSING_FACILITY): Payer: Medicare Other | Admitting: Internal Medicine

## 2017-05-19 ENCOUNTER — Encounter: Payer: Self-pay | Admitting: Internal Medicine

## 2017-05-19 DIAGNOSIS — S82102G Unspecified fracture of upper end of left tibia, subsequent encounter for closed fracture with delayed healing: Secondary | ICD-10-CM | POA: Diagnosis not present

## 2017-05-19 DIAGNOSIS — E1065 Type 1 diabetes mellitus with hyperglycemia: Secondary | ICD-10-CM

## 2017-05-19 DIAGNOSIS — N183 Chronic kidney disease, stage 3 (moderate): Secondary | ICD-10-CM

## 2017-05-19 DIAGNOSIS — E1122 Type 2 diabetes mellitus with diabetic chronic kidney disease: Secondary | ICD-10-CM

## 2017-05-19 DIAGNOSIS — E7800 Pure hypercholesterolemia, unspecified: Secondary | ICD-10-CM | POA: Diagnosis not present

## 2017-05-19 DIAGNOSIS — D649 Anemia, unspecified: Secondary | ICD-10-CM | POA: Diagnosis not present

## 2017-05-19 DIAGNOSIS — Z86711 Personal history of pulmonary embolism: Secondary | ICD-10-CM | POA: Diagnosis not present

## 2017-05-19 NOTE — Progress Notes (Signed)
Location:   [facility] Room Number: 133/P Place of Service:  SNF (31)SNF Provider: Joya Gaskins, MD  Patient Care Team: Mikey Kirschner, MD as PCP - General  Extended Emergency Contact Information Primary Emergency Contact: Culverhouse,Mike Address: [address], [place] [address] [place] Phone: [phone] Mobile Phone: [phone] Relation: Son Secondary Emergency Contact: Livia Snellen, [place] [address] [place] of [place] Phone: [phone] Relation: Son  Code Status:  DNR Goals of care: Advanced Directive information Advanced Directives 05/19/2017  Does Patient Have a Medical Advance Directive? Yes  Type of Advance Directive Out of facility DNR (pink MOST or yellow form)  Does patient want to make changes to medical advance directive? No - Patient declined  Copy of [place] in Chart? -  Pre-existing out of facility DNR order (yellow form or pink MOST form) -    Chief complaint-routine visit for medical management of chronic medical conditions including history of left proximal tibia fracture-diabetes type 1-urinary retention-history of DVT and pulmonary embolism-syncope-history pneumonia -anemia-   HPI:  Pt is a 83 y.o. male seen today for medical  management of chronic diseases as noted above. He initially came here after hospitalization for renal insufficiency confusion-when he was down in [place] he did break his proximal left tibia-and has been in a long leg cast this was recently removed by orthopedics much the patient's .  He is now in a brace.  In regards to the confusion this was thought to be a combination of pain medications and dehydration he did receive fluids and medications were changed.  His confusion gradually has improved.  He is also been hospitalized for pneumonia but appears to have made an unremarkable recovery from this he  also has a history of UTIs with urinary retention-at one point had a Foley catheter this is since been removed and he is apparently voiding now fairly well.  He appears to be getting stronger somewhat although still frail-he continues to be in good spirits in fact just been a weekend at the beach on the.  Regards to diabetes type 1 he is unsure C but he has quite variable blood sugars in the morning ranging from the low 70s up to the high 100s.  Later in the day has more variability somewhat more elevations at times in the 2-300 range although at times will be more in the lower 100s mid 100s.  He is on sliding scale NovoLog insulin as well as [place]   12 units a day  He also has had some history syncope according to family this is not new and has been chronic with no clear etiology nonetheless he is been stable in this regard as well for fairly extended period of time.  He does have a history of anemia in fact hemoglobin had gone slightly above 9 at one point when his baseline was around 12 however updated labs showed was back at baseline 12.3 this was done in late April we will update this.  Currently he is sitting in his wheelchair comfortably currently has a leg brace on on his left leg-he does not really have any complaints appears to be eating well he pre-much clean his dinner plate  .     Past Medical History:  Diagnosis Date  . Diabetes mellitus   . DVT (deep venous  thrombosis) ([facility])   . Gastroparesis   . Hypertension   . Legally blind    diabetic retinopathy  . Neuropathy   . Osteoporosis   . Pulmonary embolism ([facility]) 12/2010   on blood thinners  . Stroke ([facility])   . Weight loss    Past Surgical History:  Procedure Laterality Date  . ANKLE SURGERY     Patient states that he had pins place in the left foot  . BACK SURGERY  2008  . CARPAL TUNNEL RELEASE  08/2011   Left hand  . COLONOSCOPY  01/2011  . ORIF HIP FRACTURE  10/31/2011   Procedure: OPEN REDUCTION INTERNAL  FIXATION HIP;  Surgeon: Arther Abbott, MD;  Location: [facility];  Service: Orthopedics;  Laterality: Right;  with Gamma Nail  . TRANSURETHRAL RESECTION OF PROSTATE  1984  . UPPER GASTROINTESTINAL ENDOSCOPY  01/2011    No Known Allergies    Review of Systems  In general does not complaining fever chills--her to have gained some weight about 8 pounds in the past month apparently is eating better.  Skin is not rash or itching.  Head ears eyes nose mouth and throat does not complain a sore throat or visual changes.  Does not complain of visual changes.  Respiratory is not really complaining of cough or shortness of breath .  Heart is not complaining of chest pain or significant lower extremity edema.  GI does not complain of any nausea vomiting diarrhea constipation or abdominal pain.  Muscle skeletal does have history at times of back pain also has a history of a fracture of the distal end of the left femur -- any discomfort appears to be controlled  He is not really complaining of leg pain at this time.  Neurologic is not complaining of dizziness headache or syncope.  Psych does have a history of confusion which apparently waxes and wanes apparently this has improved    Immunization History  Administered Date(s) Administered  . Influenza Split 08/31/2013  . Influenza,inj,Quad PF,36+ Mos 09/07/2014, 09/12/2015  . Influenza-Unspecified 09/02/2011, 10/02/2016  . Pneumococcal Conjugate-13 06/07/2014  . Pneumococcal Polysaccharide-23 09/01/2001  . Td 06/01/2009  . Tdap 04/26/2016  . Zoster 05/23/2009   Pertinent  Health Maintenance Due  Topic Date Due  . URINE MICROALBUMIN  02/20/1936  . OPHTHALMOLOGY EXAM  06/12/2016  . FOOT EXAM  04/08/2017  . INFLUENZA VACCINE  07/02/2017  . HEMOGLOBIN A1C  08/31/2017  . PNA vac Low Risk Adult  Completed   Fall Risk  06/10/2016  Falls in the past year? Yes  Number falls in past yr: 1  Injury with Fall? Yes  Risk Factor  Category  High Fall Risk  Risk for fall due to : Impaired vision  Follow up Falls evaluation completed;Education provided;Falls prevention discussed   Functional Status Survey:     He is afebrile pulse of 80 respirations 18 blood pressure taken manually 108/46 weight is 127.6 this appears again of about 8 pounds over the past month Physical Exam  yIn general this is a frail appearing elderly male in no distress -- he is bright and alert,   His skin is warm and dry. He does have some covering over skin tears upper extremities bilaterally  Eyes sclera and conjunctiva are clear he does have a history of visual deficits at baseline.  Oropharynx is clear mucous membranes moist.  Chest is clear to auscultation with somewhat shallow air entry there is no labored breathing.  Heart is regular rate and  rhythm with occasional irregular beats he has fairly minimal lower extremity edema on the left--pedal pulses are intact  His left leg is in a brace  Abdomen is soft nontender with positive bowel sounds.  Muscle skeletal does have a brace on his left leg otherwise moves other extremities at baseline somewhat limited exam since he is in bed.  Neurologic is grossly intact no lateralizing findings her speech is clear he does have generalized weakness.  Psyche is grossly alert and oriented bright and alert continues to be in good spirits   Labs reviewed:  Recent Labs  02/27/17 0436  03/01/17 0626  03/10/17 0700 03/19/17 1412 04/03/17 0700 04/07/17 0700  NA 137  < > 139  < > 134* 134* 138 138  K 4.6  < > 4.0  < > 4.3 3.8 5.1 4.4  CL 97*  < > 98*  < > 97* 100* 101 102  CO2 25  < > 27  < > 22 28 29 30   GLUCOSE 334*  < > 268*  < > 328* 198* 203* 253*  BUN 24*  < > 17  < > 24* 30* 40* 33*  CREATININE 1.14  < > 0.88  < > 1.12 0.83 0.94 0.97  CALCIUM 8.3*  < > 8.1*  < > 8.0* 8.2* 8.9 8.9  MG 2.1  --  2.0  --  2.0  --   --   --   < > = values in this interval not  displayed.  Recent Labs  03/03/17 1400 03/05/17 0700 03/19/17 1412  AST 17 18 19   ALT 13* 14* 11*  ALKPHOS 196* 205* 167*  BILITOT 1.9* 0.8 0.5  PROT 4.8* 5.0* 5.3*  ALBUMIN 2.3* 2.4* 2.5*    Recent Labs  03/07/17 0700 03/19/17 1412 03/25/17 0700  WBC 5.6 7.3 7.3  NEUTROABS 4.1 6.2 4.9  HGB 12.0* 9.6* 12.3*  HCT 35.8* 28.8* 38.1*  MCV 93.5 95.4 96.7  PLT 407* 303 337   No results found for: TSH Lab Results  Component Value Date   HGBA1C 7.6 (H) 02/28/2017   Lab Results  Component Value Date   CHOL 208 (H) 05/07/2016   HDL 72 05/07/2016   LDLCALC 119 (H) 05/07/2016   TRIG 86 05/07/2016   CHOLHDL 2.9 05/07/2016    Significant Diagnostic Results in last 30 days:  Dg Tibia/fibula Left  Result Date: 05/16/2017 Radiograph report Dr. Aline Brochure dictating This is a left proximal tibia fracture AP and lateral and several other views for completion Proximal metaphyseal tibia fracture shows minimal angulation there is also a proximal fibular fracture nondisplaced There is a portion of the fracture which goes into the lateral plateau nondisplaced The lateral x-ray shows impaction of the fracture slight shortening with a small portion of the fracture showing apex anterior angulation The fracture is impacted with sclerotic bone around the fracture. There is also noted ankle in distal femur hardware Impression healing proximal tibial metaphyseal fracture with mild apex anterior angulation  Dg Tibia/fibula Left  Result Date: 05/02/2017 Radiology report for left proximal tibia fracture Aline Brochure dictating On the left tibial x-rays we see a screw and K wire medially fibular nail laterally which are from prior fractures the tibia fracture proximally shows apex anterior angulation with impaction and there is also a hold fixation of the femoral fracture of the femoral. On the AP view we do see some callus especially medially. There is also fibular head fracture which is part of the tib-fib  proximal  Impression healing fracture of the proximal tibia and fibula    Assessment/Plan  #1 history of proximal left fracture of the tibia-this is followed by orthopedics C is now off the long cast and has a brace-appears to be doing well in this regard is not really complaining of pain.  #2 history of diabetes type 1 he is on Tresiba 12 units as well as NovoLog sliding scale insulin with meals-he does have somewhat elevated blood sugars later in the day-this was discussed with Dr. Lyndel Safe and will start him on a basal 5 units of NovoLog if blood sugars greater than 150 over this will he does somewhat more consistent control later in the day will monitor.--Also will update hemoglobin A1c  #3 history of urinary retentionCdoes have urology follow-up catheter has been removed he appears to be voiding better.  He is on Flomax.  #4 history of syncope-this appears to have stabilized again this has been chronic and long-term per family nonetheless this appears to be less frequent now which is encouraging.  #5 history of pneumonia this was treated previously with antibiotics she does not really have increased cough or congestion this appears to have stabilized.  #6-history of DVT and PE in 2012 he is on Xarelto this appears stable he has some mild left lower extremity edema Dopplers have been negative-suspect this could be related to recent fracture.  #7 history of vitamin D deficiency vitamin D level was 67.2 on lab done in May he is on supplements.  Number a history of chronic kidney disease this has been stable with creatinine 0.97 BUN of 33 on lab done in May we will update this.  #9 history of sacral ulcer this is almost healed per patient it is still being treated with protective dressing and followed by wound care.  #10 history of anemia suspect of chronic disease this appears to have stabilized with hemoglobin of 12.3 on lab done on April 24 at one time had dipped closer to 9 but quickly  responded occult blood testing was negative Will update this.  #11 history of hyperlipidemia last lipid panel in June 2017 showed an LDL of 119 HDL 72-we will update this again will be somewhat conservative with his advanced age he is on Zetia  #12 history of B12 deficiency I notice he is on B12 supplementation Will update a level here.  #13 history of depression he is on Remeron this appears to be quite stable appears to be in good spirits.  Of note Will update a CBC with history of anemia as well as CMP with history of renal insufficiency-also will update a B12 level-as well as a fasting lipid panel.  Also will add a basal dose of NovoLog with meals secondary to some elevated blood sugars later in the day. Only give a patient eats greater than or equal to 50% a meal and CBG is  greater than 150  [id number]-of note greater than 45 minutes spent assessing patient-discussing his status with nursing staff as well as with Dr. Laureen Abrahams his chart-reviewing his labs-and coordinating and formulating a plan of care for numerous diagnoses-of note greater than 50% of time spent coordinating plan of care

## 2017-05-20 ENCOUNTER — Encounter (HOSPITAL_COMMUNITY)
Admission: RE | Admit: 2017-05-20 | Discharge: 2017-05-20 | Disposition: A | Discharge status: Home / Self Care | Payer: Medicare Other | Source: Skilled Nursing Facility | Attending: Internal Medicine | Admitting: Internal Medicine

## 2017-05-20 ENCOUNTER — Telehealth: Payer: Self-pay | Admitting: Orthopedic Surgery

## 2017-05-20 DIAGNOSIS — I10 Essential (primary) hypertension: Secondary | ICD-10-CM | POA: Insufficient documentation

## 2017-05-20 DIAGNOSIS — B351 Tinea unguium: Secondary | ICD-10-CM | POA: Diagnosis not present

## 2017-05-20 DIAGNOSIS — E1142 Type 2 diabetes mellitus with diabetic polyneuropathy: Secondary | ICD-10-CM | POA: Diagnosis not present

## 2017-05-20 LAB — COMPREHENSIVE METABOLIC PANEL
ALT: 16 U/L — ABNORMAL LOW (ref 17–63)
ANION GAP: 6 (ref 5–15)
AST: 19 U/L (ref 15–41)
Albumin: 2.9 g/dL — ABNORMAL LOW (ref 3.5–5.0)
Alkaline Phosphatase: 117 U/L (ref 38–126)
BILIRUBIN TOTAL: 0.7 mg/dL (ref 0.3–1.2)
BUN: 33 mg/dL — AB (ref 6–20)
CO2: 31 mmol/L (ref 22–32)
Calcium: 8.8 mg/dL — ABNORMAL LOW (ref 8.9–10.3)
Chloride: 102 mmol/L (ref 101–111)
Creatinine, Ser: 0.98 mg/dL (ref 0.61–1.24)
Glucose, Bld: 77 mg/dL (ref 65–99)
POTASSIUM: 3.9 mmol/L (ref 3.5–5.1)
Sodium: 139 mmol/L (ref 135–145)
TOTAL PROTEIN: 5.7 g/dL — AB (ref 6.5–8.1)

## 2017-05-20 LAB — CBC
HEMATOCRIT: 35.3 % — AB (ref 39.0–52.0)
Hemoglobin: 11.6 g/dL — ABNORMAL LOW (ref 13.0–17.0)
MCH: 31.1 pg (ref 26.0–34.0)
MCHC: 32.9 g/dL (ref 30.0–36.0)
MCV: 94.6 fL (ref 78.0–100.0)
Platelets: 279 10*3/uL (ref 150–400)
RBC: 3.73 MIL/uL — ABNORMAL LOW (ref 4.22–5.81)
RDW: 14.7 % (ref 11.5–15.5)
WBC: 4.6 10*3/uL (ref 4.0–10.5)

## 2017-05-20 LAB — LIPID PANEL
CHOL/HDL RATIO: 2.2 ratio
CHOLESTEROL: 136 mg/dL (ref 0–200)
HDL: 61 mg/dL (ref >40)
LDL Cholesterol: 66 mg/dL (ref 0–99)
TRIGLYCERIDES: 43 mg/dL (ref <150)
VLDL: 9 mg/dL (ref 0–40)

## 2017-05-20 LAB — VITAMIN B12: Vitamin B-12: 398 pg/mL (ref 180–914)

## 2017-05-20 NOTE — Telephone Encounter (Signed)
no

## 2017-05-20 NOTE — Telephone Encounter (Signed)
Called back to notify; reached Iona Beard, nurse supervisor, relayed.

## 2017-05-20 NOTE — Telephone Encounter (Signed)
Call from [place] at [facility] - requests to speak with nurse; her direct ph#'s: [phone] and [phone] - she is there until 3:15pm today.  Question is regarding brace - physical therapist asking if it may be removed during therapy. Aware his orders relay that he may remove it for showers. Please advise.

## 2017-05-20 NOTE — Telephone Encounter (Signed)
SHOULD BRACE BE REMOVED FOR PT?

## 2017-05-20 NOTE — Telephone Encounter (Signed)
Please advise nurse

## 2017-05-21 LAB — HEMOGLOBIN A1C
HEMOGLOBIN A1C: 7.9 % — AB (ref 4.8–5.6)
MEAN PLASMA GLUCOSE: 180 mg/dL

## 2017-05-23 ENCOUNTER — Non-Acute Institutional Stay (SKILLED_NURSING_FACILITY): Payer: Medicare Other | Admitting: Internal Medicine

## 2017-05-23 DIAGNOSIS — D649 Anemia, unspecified: Secondary | ICD-10-CM

## 2017-05-23 DIAGNOSIS — E1022 Type 1 diabetes mellitus with diabetic chronic kidney disease: Secondary | ICD-10-CM | POA: Diagnosis not present

## 2017-05-23 DIAGNOSIS — N183 Chronic kidney disease, stage 3 (moderate): Secondary | ICD-10-CM

## 2017-05-23 DIAGNOSIS — E1122 Type 2 diabetes mellitus with diabetic chronic kidney disease: Secondary | ICD-10-CM

## 2017-05-23 DIAGNOSIS — Z833 Family history of diabetes mellitus: Secondary | ICD-10-CM | POA: Diagnosis not present

## 2017-05-23 DIAGNOSIS — I129 Hypertensive chronic kidney disease with stage 1 through stage 4 chronic kidney disease, or unspecified chronic kidney disease: Secondary | ICD-10-CM | POA: Diagnosis not present

## 2017-05-23 DIAGNOSIS — Z794 Long term (current) use of insulin: Secondary | ICD-10-CM | POA: Diagnosis not present

## 2017-05-23 DIAGNOSIS — E559 Vitamin D deficiency, unspecified: Secondary | ICD-10-CM | POA: Diagnosis not present

## 2017-05-23 DIAGNOSIS — E1042 Type 1 diabetes mellitus with diabetic polyneuropathy: Secondary | ICD-10-CM | POA: Diagnosis not present

## 2017-05-23 DIAGNOSIS — E103593 Type 1 diabetes mellitus with proliferative diabetic retinopathy without macular edema, bilateral: Secondary | ICD-10-CM | POA: Diagnosis not present

## 2017-05-23 DIAGNOSIS — Z86718 Personal history of other venous thrombosis and embolism: Secondary | ICD-10-CM | POA: Diagnosis not present

## 2017-05-23 DIAGNOSIS — E1065 Type 1 diabetes mellitus with hyperglycemia: Secondary | ICD-10-CM | POA: Diagnosis not present

## 2017-05-23 DIAGNOSIS — M810 Age-related osteoporosis without current pathological fracture: Secondary | ICD-10-CM | POA: Diagnosis not present

## 2017-05-23 DIAGNOSIS — E7800 Pure hypercholesterolemia, unspecified: Secondary | ICD-10-CM | POA: Diagnosis not present

## 2017-05-24 NOTE — Progress Notes (Signed)
This is an acute visit.  Level of care skilled.  Facility is [facility].  Chief complaint-acute visit follow-up elevated blood sugars with history of diabetes type 1.  History of present illness.  Patient is a very pleasant 83 year old male seen today for elevated blood sugar.  He initially came here after hospitalization for renal insufficiency confusion-when he was down in [place] he did break his proximal left tibia-and has been in a long leg cast this was recently removed by orthopedics much the patient's .  He is now in a brace.  In regards to the confusion this was thought to be a combination of pain medications and dehydration he did receive fluids and medications were changed.  His confusion gradually has improved.  He is also been hospitalized for pneumonia but appears to have made an unremarkable recovery from this he also has a history of UTIs with urinary retention-at one point had a Foley catheter this is since been removed and he is apparently voiding now fairly well.  He appears to be getting stronger somewhat although still frail-he continues to be in good spirits back was at the beach with a friend last weekend.  He is a type I diabetic and has quite variable blood sugars sugars appear to be somewhat more elevated later in the day despite sliding scale so he was put on a standard dose of 5 units with meals.  This was done earlier this week.  However today he has a significantly elevated blood sugar of over 600 per nursing.  Actually has just been seen endocrinology [facility] orders are pending.  He is on [place] 12 units daily  Nursing staff reports he is stable he does complain of increased thirst but otherwise is doing well.  We did give him 12 units of. NovoLog-in blood sugar did come down to 427-he has been given 10 additional units and updated blood sugar is pending.  Currently he iis resting in his bed comfortably -- he is visiting  with his son this afternoon  Past Medical History:  Diagnosis Date  . Diabetes mellitus   . DVT (deep venous thrombosis) ([facility])   . Gastroparesis   . Hypertension   . Legally blind    diabetic retinopathy  . Neuropathy   . Osteoporosis   . Pulmonary embolism ([facility]) 12/2010   on blood thinners  . Stroke ([facility])   . Weight loss         Past Surgical History:  Procedure Laterality Date  . ANKLE SURGERY     Patient states that he had pins place in the left foot  . BACK SURGERY  2008  . CARPAL TUNNEL RELEASE  08/2011   Left hand  . COLONOSCOPY  01/2011  . ORIF HIP FRACTURE  10/31/2011   Procedure: OPEN REDUCTION INTERNAL FIXATION HIP;  Surgeon: Arther Abbott, MD;  Location: [facility];  Service: Orthopedics;  Laterality: Right;  with Gamma Nail  . TRANSURETHRAL RESECTION OF PROSTATE  1984  . UPPER GASTROINTESTINAL ENDOSCOPY  01/2011    No Known Allergies  Outpatient Encounter Prescriptions as of 04/21/2017  Medication Sig  . acetaminophen (TYLENOL) 325 MG tablet Take 650 mg by mouth every 4 (four) hours as needed for mild pain or moderate pain.   . Amino Acids-Protein Hydrolys (FEEDING SUPPLEMENT, PRO-STAT 64,) LIQD Take 30 mLs by mouth 2 (two) times daily.  Marland Kitchen aspirin 81 MG tablet Take 81 mg by mouth every morning.   Roseanne Kaufman [place]-Castor Oil (VENELEX)  OINT Apply to right buttocks and sacrum for prevention every shift  .     . citalopram (CELEXA) 20 MG tablet Take 1 tablet (20 mg total) by mouth every morning.  . collagenase (SANTYL) ointment Apply to coccyx wound per tx orders  . ezetimibe (ZETIA) 10 MG tablet Take 1 tablet (10 mg total) by mouth at bedtime.  . insulin aspart (NOVOLOG FLEXPEN) 100 UNIT/ML FlexPen Inject 2-10 Units into the skin 3 (three) times daily before meals. Less than 80-Call MD 151-200= 2 units 201-250= 4 units 251-300= 6 units 301-350= 8 units 351-400=10units>>>If greater than, call MD  . latanoprost (XALATAN) 0.005 %  ophthalmic solution Place 1 drop into both eyes at bedtime.   . mirtazapine (REMERON) 7.5 MG tablet Take 7.5 mg by mouth at bedtime.  . Multiple Vitamin (MULTIVITAMIN WITH MINERALS) TABS tablet Take 1 tablet by mouth daily.  Marland Kitchen omeprazole (PRILOSEC) 20 MG capsule Take 20 mg by mouth daily.  . polyethylene glycol powder (GLYCOLAX/MIRALAX) powder Take 34 g by mouth every morning.  . rivaroxaban (XARELTO) 20 MG TABS tablet Take 1 tablet (20 mg total) by mouth daily with supper.  Tyler Aas FLEXTOUCH 100 UNIT/ML SOPN FlexTouch Pen Inject 12 Units into the muscle at bedtime.   . vitamin B-12 (CYANOCOBALAMIN) 1000 MCG tablet Take 1,000 mcg by mouth daily.    . Vitamin D, Ergocalciferol, (DRISDOL) 50000 UNITS CAPS Take 50,000 Units by mouth every Sunday.          Review of Systems  General is not complaining of any fever or chillsSays he had increased thirst earlier today but this apparently has resolved  Skin does not complain of rashes or itching  Head ears eyes nose mouth and throat does not complaining of of any visual changes or sore throat.  Resp- does not complaining any shortness breath or cough.  Cardiac does not complain of chest pain leg swelling on left continues to improve  GI is not complaining of abdominal discomfort nausea vomiting diarrhea constipation.  GU is not playing of dysuria is indwelling Foley catheter has been removed according to nursing he is voiding well.  Musculoskeletal does have a left tibial fracture again currently has a brace t followed by orthopedics.  Neurologic is not complaining of dizziness headache or numbness.   psych does not complain of overt depression or anxiety-continues to be in good spirits.         Immunization History  Administered Date(s) Administered  . Influenza Split 08/31/2013  . Influenza,inj,Quad PF,36+ Mos 09/07/2014, 09/12/2015  . Influenza-Unspecified 09/02/2011, 10/02/2016  . Pneumococcal Conjugate-13  06/07/2014  . Pneumococcal Polysaccharide-23 09/01/2001  . Td 06/01/2009  . Tdap 04/26/2016  . Zoster 05/23/2009       Pertinent  Health Maintenance Due  Topic Date Due  . URINE MICROALBUMIN  02/20/1936  . OPHTHALMOLOGY EXAM  06/12/2016  . FOOT EXAM  04/08/2017  . INFLUENZA VACCINE  07/02/2017  . HEMOGLOBIN A1C  08/31/2017  . PNA vac Low Risk Adult  Completed   Fall Risk  06/10/2016  Falls in the past year? Yes  Number falls in past yr: 1  Injury with Fall? Yes  Risk Factor Category  High Fall Risk  Risk for fall due to : Impaired vision  Follow up Falls evaluation completed;Education provided;Falls prevention discussed   Functional Status Survey:      Temp  97.1 pulse 86 respirations 20 blood pressure 114/58  Physical Exam   In general this is a frail very  pleasant elderly male in no distress lying comfortably in bed  His skin is warm and dry.  Oropharynx is clear mucous membranes moist.  Heart is regular rate and rhythm cannot really appreciate a murmur gallop or rub he does have  edema of his left foot but this is improved significantly from when I last saw  it-.  Chest is clear to auscultation with somewhat shallow air entry there is no labored breathing.  Abdomen is soft nontender with positive bowel sounds.  GU  Foley catheter has been removed I cannot appreciate suprapubic tenderness  Musculoskeletal moves upper extremities and right lower extremity at baseline does have a brace applied to his left lower extremity Has mild left lower extremity edema this appears to gradually be improving    Neurologic grossly intact to speech is clear.  Psych he appears grossly alert and oriented pleasant and appropriate  Labs reviewed 05/20/2017.  Hemoglobin A1c 7.9.  Sodium 139 potassium 3.9 BUN 33 creatinine 0.98.  Albumin 2.9-ALT 16 otherwise liver function tests within normal limits.  WBC 4.6 hemoglobin 11.6 platelets  279  :  RecentLabs(withinlast365days)   Recent Labs  02/27/17 0436  03/01/17 0626  03/10/17 0700 03/19/17 1412 04/03/17 0700 04/07/17 0700  NA 137  < > 139  < > 134* 134* 138 138  K 4.6  < > 4.0  < > 4.3 3.8 5.1 4.4  CL 97*  < > 98*  < > 97* 100* 101 102  CO2 25  < > 27  < > 22 28 29 30   GLUCOSE 334*  < > 268*  < > 328* 198* 203* 253*  BUN 24*  < > 17  < > 24* 30* 40* 33*  CREATININE 1.14  < > 0.88  < > 1.12 0.83 0.94 0.97  CALCIUM 8.3*  < > 8.1*  < > 8.0* 8.2* 8.9 8.9  MG 2.1  --  2.0  --  2.0  --   --   --   < > = values in this interval not displayed.    RecentLabs(withinlast365days)   Recent Labs  03/03/17 1400 03/05/17 0700 03/19/17 1412  AST 17 18 19   ALT 13* 14* 11*  ALKPHOS 196* 205* 167*  BILITOT 1.9* 0.8 0.5  PROT 4.8* 5.0* 5.3*  ALBUMIN 2.3* 2.4* 2.5*      RecentLabs(withinlast365days)   Recent Labs  03/07/17 0700 03/19/17 1412 03/25/17 0700  WBC 5.6 7.3 7.3  NEUTROABS 4.1 6.2 4.9  HGB 12.0* 9.6* 12.3*  HCT 35.8* 28.8* 38.1*  MCV 93.5 95.4 96.7  PLT 407* 303 337     RecentLabs  No results found for: TSH   RecentLabs   Assessment and plan.  #1-diabetes type 1-this has been somewhat of a challenging situation is currently on  Tresiba 12 units daily as well as NovoLog insulin 5 units with meals-sugars had been somewhat stabilized but today sugars are significantly elevated again this is trending down with NovoLog in fact when I was in the room recheck of the blood sugar showed it had come down to 356  Clinically he appears stable he did see endocrinology at [facility] today we are waiting orders from them-at this point will continue current regiment and await the updated orders from [facility]---.  #2 history of chronic kidney disease BUN 33 creatinine 0.98 on lab earlier this week show stability will monitor periodically especially in light of his diabetes.  History of anemia most likely  chronic  disease this appears relatively stable with a hemoglobin of 11.6 on this week's lab at one point had been down to 9 foot but on subsequent labs quickly rose-occult  blood testing has been negative  [id number]

## 2017-05-26 ENCOUNTER — Non-Acute Institutional Stay (SKILLED_NURSING_FACILITY): Payer: Medicare Other | Admitting: Internal Medicine

## 2017-05-26 ENCOUNTER — Encounter: Payer: Self-pay | Admitting: Internal Medicine

## 2017-05-26 DIAGNOSIS — M7989 Other specified soft tissue disorders: Secondary | ICD-10-CM | POA: Diagnosis not present

## 2017-05-26 DIAGNOSIS — E1142 Type 2 diabetes mellitus with diabetic polyneuropathy: Secondary | ICD-10-CM

## 2017-05-26 DIAGNOSIS — E1143 Type 2 diabetes mellitus with diabetic autonomic (poly)neuropathy: Secondary | ICD-10-CM

## 2017-05-26 DIAGNOSIS — E1165 Type 2 diabetes mellitus with hyperglycemia: Secondary | ICD-10-CM

## 2017-05-26 DIAGNOSIS — L03116 Cellulitis of left lower limb: Secondary | ICD-10-CM | POA: Diagnosis not present

## 2017-05-26 NOTE — Progress Notes (Signed)
Location:   [facility] Room Number: 133/P Place of Service:  SNF (31) Provider:  Carrolyn Leigh, MD  Patient Care Team: Mikey Kirschner, MD as PCP - General  Extended Emergency Contact Information Primary Emergency Contact: Aller,Mike Address: [address] [place] [address] [place] Phone: [phone] Mobile Phone: [phone] Relation: Son Secondary Emergency Contact: Livia Snellen, [place] [address] [place] of [place] Phone: [phone] Relation: Son  Code Status:  DNR Goals of care: Advanced Directive information Advanced Directives 05/26/2017  Does Patient Have a Medical Advance Directive? Yes  Type of Advance Directive Out of facility DNR (pink MOST or yellow form)  Does patient want to make changes to medical advance directive? No - Patient declined  Copy of [place] in Chart? -  Pre-existing out of facility DNR order (yellow form or pink MOST form) -     Chief Complaint  Patient presents with  . Acute Visit    Edemia    HPI:  Pt is a 83 y.o. male seen today for an acute visit for   Patient has h/o IDDM With Neuropathy,followed by Dr Altheimer, Chronic Kidney disaese Stage 3 , Hyperlipidemia, Vit D deficiency, Gastroparesis, S/P Hip fracture in 11/12 and 2014 with osteoporosis treated before, DVT with PE in 2012 on chronic Anticoagulation, Diabetic retinopathy. With  Proximal Left Proximal fracture of Tibia after Mechanical Fall.on 03/18 almost healed and in the brace now  Patient has noticed increasing swelling in his Left leg especially in his foot and there is now drainage from the Leg with some redness around the knee and below the knee.. This was noticed this weekend. He was started on Bactrim. He is now weight bearing on that leg and has been walking with the new brace. He denies any injury to the leg. Denies any pain. No fever or chills.  No Cough or SOB. He has gained weight recently almost 10 lbs since 04/21/17 from 120 lbs to 130 lbs. Past Medical History:  Diagnosis Date  . Diabetes mellitus   . DVT (deep venous thrombosis) ([facility])   . Gastroparesis   . Hypertension   . Legally blind    diabetic retinopathy  . Neuropathy   . Osteoporosis   . Pulmonary embolism ([facility]) 12/2010   on blood thinners  . Stroke ([facility])   . Weight loss    Past Surgical History:  Procedure Laterality Date  . ANKLE SURGERY     Patient states that he had pins place in the left foot  . BACK SURGERY  2008  . CARPAL TUNNEL RELEASE  08/2011   Left hand  . COLONOSCOPY  01/2011  . ORIF HIP FRACTURE  10/31/2011   Procedure: OPEN REDUCTION INTERNAL FIXATION HIP;  Surgeon: Arther Abbott, MD;  Location: [facility];  Service: Orthopedics;  Laterality: Right;  with Gamma Nail  . TRANSURETHRAL RESECTION OF PROSTATE  1984  . UPPER GASTROINTESTINAL ENDOSCOPY  01/2011    No Known Allergies  Outpatient Encounter Prescriptions as of 05/26/2017  Medication Sig  . acetaminophen (TYLENOL) 325 MG tablet Take 650 mg by mouth every 4 (four) hours as needed for mild pain or moderate pain.   . Amino Acids-Protein Hydrolys (FEEDING SUPPLEMENT, PRO-STAT 64,) LIQD Take 30 mLs by mouth 2 (two) times daily.  Marland Kitchen amoxicillin (AMOXIL) 500 MG tablet Take 2,000  mg by mouth daily.  Marland Kitchen aspirin 81 MG tablet Take 81 mg by mouth every morning.   Roseanne Kaufman Peru-Castor Oil (VENELEX) OINT Apply to right buttocks and sacrum for prevention every shift  . citalopram (CELEXA) 20 MG tablet Take 1 tablet (20 mg total) by mouth every morning.  . ezetimibe (ZETIA) 10 MG tablet Take 1 tablet (10 mg total) by mouth at bedtime.  . insulin aspart (NOVOLOG) 100 UNIT/ML injection Inject 5 Units into the skin 3 (three) times daily before meals.  . latanoprost (XALATAN) 0.005 % ophthalmic solution Place 1 drop into both eyes at bedtime.   . mirtazapine (REMERON) 7.5 MG tablet Take 7.5 mg by mouth at  bedtime.  . Multiple Vitamin (MULTIVITAMIN WITH MINERALS) TABS tablet Take 1 tablet by mouth daily.  Marland Kitchen omeprazole (PRILOSEC) 20 MG capsule Take 20 mg by mouth daily.  . polyethylene glycol powder (GLYCOLAX/MIRALAX) powder Take 34 g by mouth every morning.  . rivaroxaban (XARELTO) 20 MG TABS tablet Take 1 tablet (20 mg total) by mouth daily with supper.  . sulfamethoxazole-trimethoprim (BACTRIM DS,SEPTRA DS) 800-160 MG tablet Take 1 tablet by mouth 2 (two) times daily.  . tamsulosin (FLOMAX) 0.4 MG CAPS capsule Take 0.4 mg by mouth daily.  Tyler Aas FLEXTOUCH 100 UNIT/ML SOPN FlexTouch Pen Inject 12 Units into the muscle at bedtime.   . vitamin B-12 (CYANOCOBALAMIN) 1000 MCG tablet Take 1,000 mcg by mouth daily.    . Vitamin D, Ergocalciferol, (DRISDOL) 50000 UNITS CAPS Take 50,000 Units by mouth every Sunday.   . [DISCONTINUED] collagenase (SANTYL) ointment Apply to coccyx wound per tx orders  . [DISCONTINUED] insulin aspart (NOVOLOG FLEXPEN) 100 UNIT/ML FlexPen Inject 2-10 Units into the skin 3 (three) times daily before meals. Less than 80-Call MD 151-200= 2 units 201-250= 4 units 251-300= 6 units 301-350= 8 units 351-400=10units>>>If greater than, call MD   No facility-administered encounter medications on file as of 05/26/2017.      Review of Systems  Review of Systems  Constitutional: Negative for activity change, appetite change, chills, diaphoresis, fatigue and fever.  HENT: Negative for mouth sores, postnasal drip, rhinorrhea, sinus pain and sore throat.   Respiratory: Negative for apnea, cough, chest tightness, shortness of breath and wheezing.   Cardiovascular: Negative for chest pain, palpitations and positive for  leg swelling.  Gastrointestinal: Negative for abdominal distention, abdominal pain, constipation, diarrhea, nausea and vomiting.  Genitourinary: Negative for dysuria and frequency.  Musculoskeletal: Negative for arthralgias, joint swelling and myalgias.  Skin:  Negative for rash.  Neurological: Negative for dizziness, syncope, weakness, light-headedness and numbness.  Psychiatric/Behavioral: Negative for behavioral problems, confusion and sleep disturbance.     Immunization History  Administered Date(s) Administered  . Influenza Split 08/31/2013  . Influenza,inj,Quad PF,36+ Mos 09/07/2014, 09/12/2015  . Influenza-Unspecified 09/02/2011, 10/02/2016  . Pneumococcal Conjugate-13 06/07/2014  . Pneumococcal Polysaccharide-23 09/01/2001  . Td 06/01/2009  . Tdap 04/26/2016  . Zoster 05/23/2009   Pertinent  Health Maintenance Due  Topic Date Due  . URINE MICROALBUMIN  02/20/1936  . OPHTHALMOLOGY EXAM  06/12/2016  . FOOT EXAM  04/08/2017  . INFLUENZA VACCINE  07/02/2017  . HEMOGLOBIN A1C  11/19/2017  . PNA vac Low Risk Adult  Completed   Fall Risk  06/10/2016  Falls in the past year? Yes  Number falls in past yr: 1  Injury with Fall? Yes  Risk Factor Category  High Fall Risk  Risk for fall due to : Impaired vision  Follow up Falls evaluation  completed;Education provided;Falls prevention discussed   Functional Status Survey:    Vitals:   05/26/17 1506  BP: 123/76  Pulse: 82  Resp: 20  Temp: 98 F (36.7 C)  TempSrc: Oral   There is no height or weight on file to calculate BMI. Physical Exam  Constitutional: He appears well-developed and well-nourished.  HENT:  Head: Normocephalic.  Mouth/Throat: Oropharynx is clear and moist.  Eyes: Pupils are equal, round, and reactive to light.  Neck: Neck supple.  Cardiovascular: Normal rate and normal heart sounds.   Pulmonary/Chest: Effort normal and breath sounds normal. No respiratory distress. He has no wheezes. He has no rales.  Abdominal: Soft. Bowel sounds are normal. He exhibits no distension. There is no tenderness. There is no rebound.  Musculoskeletal:  Has swelling Bilateral but more swelling in Left foot and some redness around and below the knee. Has no pain on palpitation. He  did have one small site which was mainly draining.    Labs reviewed:  Recent Labs  02/27/17 0436  03/01/17 0626  03/10/17 0700  04/03/17 0700 04/07/17 0700 05/20/17 0717  NA 137  < > 139  < > 134*  < > 138 138 139  K 4.6  < > 4.0  < > 4.3  < > 5.1 4.4 3.9  CL 97*  < > 98*  < > 97*  < > 101 102 102  CO2 25  < > 27  < > 22  < > 29 30 31   GLUCOSE 334*  < > 268*  < > 328*  < > 203* 253* 77  BUN 24*  < > 17  < > 24*  < > 40* 33* 33*  CREATININE 1.14  < > 0.88  < > 1.12  < > 0.94 0.97 0.98  CALCIUM 8.3*  < > 8.1*  < > 8.0*  < > 8.9 8.9 8.8*  MG 2.1  --  2.0  --  2.0  --   --   --   --   < > = values in this interval not displayed.  Recent Labs  03/05/17 0700 03/19/17 1412 05/20/17 0717  AST 18 19 19   ALT 14* 11* 16*  ALKPHOS 205* 167* 117  BILITOT 0.8 0.5 0.7  PROT 5.0* 5.3* 5.7*  ALBUMIN 2.4* 2.5* 2.9*    Recent Labs  03/07/17 0700 03/19/17 1412 03/25/17 0700 05/20/17 0717  WBC 5.6 7.3 7.3 4.6  NEUTROABS 4.1 6.2 4.9  --   HGB 12.0* 9.6* 12.3* 11.6*  HCT 35.8* 28.8* 38.1* 35.3*  MCV 93.5 95.4 96.7 94.6  PLT 407* 303 337 279   No results found for: TSH Lab Results  Component Value Date   HGBA1C 7.9 (H) 05/20/2017   Lab Results  Component Value Date   CHOL 136 05/20/2017   HDL 61 05/20/2017   LDLCALC 66 05/20/2017   TRIG 43 05/20/2017   CHOLHDL 2.2 05/20/2017    Significant Diagnostic Results in last 30 days:  Dg Tibia/fibula Left  Result Date: 05/16/2017 Radiograph report Dr. Aline Brochure dictating This is a left proximal tibia fracture AP and lateral and several other views for completion Proximal metaphyseal tibia fracture shows minimal angulation there is also a proximal fibular fracture nondisplaced There is a portion of the fracture which goes into the lateral plateau nondisplaced The lateral x-ray shows impaction of the fracture slight shortening with a small portion of the fracture showing apex anterior angulation The fracture is impacted with sclerotic  bone  around the fracture. There is also noted ankle in distal femur hardware Impression healing proximal tibial metaphyseal fracture with mild apex anterior angulation  Dg Tibia/fibula Left  Result Date: 05/02/2017 Radiology report for left proximal tibia fracture Aline Brochure dictating On the left tibial x-rays we see a screw and K wire medially fibular nail laterally which are from prior fractures the tibia fracture proximally shows apex anterior angulation with impaction and there is also a hold fixation of the femoral fracture of the femoral. On the AP view we do see some callus especially medially. There is also fibular head fracture which is part of the tib-fib proximal Impression healing fracture of the proximal tibia and fibula    Assessment/Plan  Left Leg Swelling with Drainage and possible cellulitis Leg examined with wound care Nurse. Do not think he needs lasix. Will Get xray of the foot to rule out Fracture Change Bactrim to Doxycyline due to his renal Insufficiency. Follow with ortho ? Brace causing the swelling.  Patient already on Xarelto for his h/o DVT.  IDDM Patient BS are running high mostly in Late afternoon. He saw Dr Altheimer and no new recommendations were made Contnue Tresiba and sliding scale.  Family/ staff Communication:   Labs/tests ordered:

## 2017-05-30 ENCOUNTER — Encounter: Payer: Self-pay | Admitting: Internal Medicine

## 2017-05-30 ENCOUNTER — Encounter (HOSPITAL_COMMUNITY)
Admission: RE | Admit: 2017-05-30 | Discharge: 2017-05-30 | Disposition: A | Discharge status: Home / Self Care | Payer: Medicare Other | Source: Skilled Nursing Facility | Attending: *Deleted | Admitting: *Deleted

## 2017-05-30 ENCOUNTER — Non-Acute Institutional Stay (SKILLED_NURSING_FACILITY): Payer: Medicare Other | Admitting: Internal Medicine

## 2017-05-30 DIAGNOSIS — E1122 Type 2 diabetes mellitus with diabetic chronic kidney disease: Secondary | ICD-10-CM | POA: Diagnosis not present

## 2017-05-30 DIAGNOSIS — E1065 Type 1 diabetes mellitus with hyperglycemia: Secondary | ICD-10-CM | POA: Diagnosis not present

## 2017-05-30 DIAGNOSIS — Z86711 Personal history of pulmonary embolism: Secondary | ICD-10-CM

## 2017-05-30 DIAGNOSIS — I10 Essential (primary) hypertension: Secondary | ICD-10-CM | POA: Diagnosis not present

## 2017-05-30 DIAGNOSIS — S82832D Other fracture of upper and lower end of left fibula, subsequent encounter for closed fracture with routine healing: Secondary | ICD-10-CM | POA: Diagnosis not present

## 2017-05-30 DIAGNOSIS — N183 Chronic kidney disease, stage 3 (moderate): Secondary | ICD-10-CM

## 2017-05-30 DIAGNOSIS — D649 Anemia, unspecified: Secondary | ICD-10-CM | POA: Diagnosis not present

## 2017-05-30 LAB — COMPREHENSIVE METABOLIC PANEL
ALBUMIN: 3.1 g/dL — AB (ref 3.5–5.0)
ALK PHOS: 140 U/L — AB (ref 38–126)
ALT: 22 U/L (ref 17–63)
AST: 33 U/L (ref 15–41)
Anion gap: 12 (ref 5–15)
BILIRUBIN TOTAL: 0.7 mg/dL (ref 0.3–1.2)
BUN: 49 mg/dL — AB (ref 6–20)
CALCIUM: 9.2 mg/dL (ref 8.9–10.3)
CO2: 24 mmol/L (ref 22–32)
CREATININE: 1.34 mg/dL — AB (ref 0.61–1.24)
Chloride: 99 mmol/L — ABNORMAL LOW (ref 101–111)
GFR calc Af Amer: 52 mL/min — ABNORMAL LOW (ref >60)
GFR calc non Af Amer: 45 mL/min — ABNORMAL LOW (ref >60)
GLUCOSE: 352 mg/dL — AB (ref 65–99)
Potassium: 4.8 mmol/L (ref 3.5–5.1)
SODIUM: 135 mmol/L (ref 135–145)
TOTAL PROTEIN: 6.1 g/dL — AB (ref 6.5–8.1)

## 2017-05-30 LAB — CBC WITH DIFFERENTIAL/PLATELET
BASOS PCT: 0 %
Basophils Absolute: 0 10*3/uL (ref 0.0–0.1)
EOS ABS: 0 10*3/uL (ref 0.0–0.7)
Eosinophils Relative: 0 %
HEMATOCRIT: 31.5 % — AB (ref 39.0–52.0)
HEMOGLOBIN: 10.5 g/dL — AB (ref 13.0–17.0)
LYMPHS ABS: 0.9 10*3/uL (ref 0.7–4.0)
Lymphocytes Relative: 15 %
MCH: 31.1 pg (ref 26.0–34.0)
MCHC: 33.3 g/dL (ref 30.0–36.0)
MCV: 93.2 fL (ref 78.0–100.0)
MONOS PCT: 11 %
Monocytes Absolute: 0.7 10*3/uL (ref 0.1–1.0)
NEUTROS ABS: 4.8 10*3/uL (ref 1.7–7.7)
NEUTROS PCT: 74 %
Platelets: 364 10*3/uL (ref 150–400)
RBC: 3.38 MIL/uL — AB (ref 4.22–5.81)
RDW: 14.2 % (ref 11.5–15.5)
WBC: 6.4 10*3/uL (ref 4.0–10.5)

## 2017-05-30 NOTE — Progress Notes (Signed)
Location:   [facility] Room Number: 133/P Place of Service:  SNF (31)  Provider: Arlo,Lassen  PCP: Mikey Kirschner, MD Patient Care Team: Mikey Kirschner, MD as PCP - General  Extended Emergency Contact Information Primary Emergency Contact: Blackwell,Mike Address: [address] [place] [address] [place] Phone: [phone] Mobile Phone: [phone] Relation: Son Secondary Emergency Contact: Livia Snellen, [place] [address] [place] Phone: [phone] Relation: Son  Code Status: DNR Goals of care:  Advanced Directive information Advanced Directives 05/30/2017  Does Patient Have a Medical Advance Directive? Yes  Type of Advance Directive Out of facility DNR (pink MOST or yellow form)  Does patient want to make changes to medical advance directive? No - Patient declined  Copy of Proberta in Chart? -  Pre-existing out of facility DNR order (yellow form or pink MOST form) -     No Known Allergies  Chief Complaint  Patient presents with  . Discharge Note    HPI:  83 y.o. male seen today for discharge from facility next week. He has a complicated medical history including history of left proximal tibia fracture-diabetes type 1-urinary retention-history of DVT and pulmonary embolism on chronic anticoagulation-as well as some history of intermittent syncope-pneumonia-and anemia.  He initially came here after hospitalization for renal insufficiency and confusion-when he was down in [place] previously did break his proximal left tibia and initially was in a long leg cast he is now on a brace and followed by orthopedics. He has also been hospitalized for pneumonia but appears to have made an unremarkable recovery from this he also has a history of UTIs with urinary retention-at one point had a Foley catheter this is since been removed and he is apparently voiding now  fairly well.  He appears to be getting stronger somewhat although still frail  He is a somewhat fragile type I diabetic and endocrinology recently at [facility] apparently with no new recommendations he is on [place] 12 units at bedtime along with a NovoLog sliding scale that starts at 151 and goes up to 400 when he receives 10 units.  Recent blood sugar show variability this morning blood sugar was 392 was actually over 500 at noon-morning sugars recently have ranged from 69 up to the 300s level.  Later in the day continues with variability last few days ranging from 103 up to 427.  This continues at 4 PM as well with recent blood sugars running from 107 up to 456.  At night his blood sugars appear to be somewhat lower however was actually 96 last night and apparently he did not receive his Tyler Aas -which may account for his higher readings today.  Nighttime blood sugars recently have been 96-101 [phone]-  He also has had some history syncope according to family this is not new and has been chronic with no clear etiology nonetheless he is been stable in this regard as well for fairly extended period of time.  He also has a history of anemia fact hemoglobin on slightly above 9 1.-previous baseline had been around 12 however this is bounced back with recent hemoglobin 11.6 which appears relatively stable.    Most acute issue recently in addition to the elevated blood sugars at times has been some drainage apparently from his left leg he was put on Bactrim and  switched to doxycycline wound care is following this--he is not complaining of any increased pain or discomfort       Past Medical History:  Diagnosis Date  . Diabetes mellitus   . DVT (deep venous thrombosis) ([facility])   . Gastroparesis   . Hypertension   . Legally blind    diabetic retinopathy  . Neuropathy   . Osteoporosis   . Pulmonary embolism ([facility]) 12/2010   on blood thinners  . Stroke ([facility])   . Weight loss      Past Surgical History:  Procedure Laterality Date  . ANKLE SURGERY     Patient states that he had pins place in the left foot  . BACK SURGERY  2008  . CARPAL TUNNEL RELEASE  08/2011   Left hand  . COLONOSCOPY  01/2011  . ORIF HIP FRACTURE  10/31/2011   Procedure: OPEN REDUCTION INTERNAL FIXATION HIP;  Surgeon: Arther Abbott, MD;  Location: [facility];  Service: Orthopedics;  Laterality: Right;  with Gamma Nail  . TRANSURETHRAL RESECTION OF PROSTATE  1984  . UPPER GASTROINTESTINAL ENDOSCOPY  01/2011      reports that he quit smoking about 35 years ago. His smoking use included Cigars. He quit after 20.00 years of use. He has never used smokeless tobacco. He reports that he does not drink alcohol or use drugs. Social History   Social History  . Marital status: Divorced    Spouse name: N/A  . Number of children: N/A  . Years of education: N/A   Occupational History  . Not on file.   Social History Main Topics  . Smoking status: Former Smoker    Years: 20.00    Types: Cigars    Quit date: 10/27/1981  . Smokeless tobacco: Never Used  . Alcohol use No     Comment: Drinks Scotch 3 times per week prior to [employer]- hasn't drank x 1 month  . Drug use: No  . Sexual activity: No   Other Topics Concern  . Not on file   Social History Narrative  . No narrative on file   Functional Status Survey:    No Known Allergies  Pertinent  Health Maintenance Due  Topic Date Due  . URINE MICROALBUMIN  02/20/1936  . OPHTHALMOLOGY EXAM  06/12/2016  . FOOT EXAM  04/08/2017  . INFLUENZA VACCINE  07/02/2017  . HEMOGLOBIN A1C  11/19/2017  . PNA vac Low Risk Adult  Completed    Medications: Outpatient Encounter Prescriptions as of 05/30/2017  Medication Sig  . acetaminophen (TYLENOL) 325 MG tablet Take 650 mg by mouth every 4 (four) hours as needed for mild pain or moderate pain.   . Amino Acids-Protein Hydrolys (FEEDING SUPPLEMENT, PRO-STAT 64,) LIQD Take 30 mLs by mouth 2 (two)  times daily.  Marland Kitchen aspirin 81 MG tablet Take 81 mg by mouth every morning.   Roseanne Kaufman [place]-Castor Oil (VENELEX) OINT Apply to right buttocks and sacrum for prevention every shift  . citalopram (CELEXA) 20 MG tablet Take 1 tablet (20 mg total) by mouth every morning.  Marland Kitchen doxycycline (DORYX) 100 MG EC tablet Take 100 mg by mouth 2 (two) times daily.  Marland Kitchen ezetimibe (ZETIA) 10 MG tablet Take 1 tablet (10 mg total) by mouth at bedtime.  . insulin aspart (NOVOLOG) 100 UNIT/ML injection Per sliding scale before meals  . latanoprost (XALATAN) 0.005 % ophthalmic solution Place 1 drop into both eyes at bedtime.   . mirtazapine (REMERON) 7.5 MG tablet Take 7.5 mg by  mouth at bedtime.  . Multiple Vitamin (MULTIVITAMIN WITH MINERALS) TABS tablet Take 1 tablet by mouth daily.  Marland Kitchen omeprazole (PRILOSEC) 20 MG capsule Take 20 mg by mouth daily.  . polyethylene glycol powder (GLYCOLAX/MIRALAX) powder Take 34 g by mouth every morning.  . rivaroxaban (XARELTO) 20 MG TABS tablet Take 1 tablet (20 mg total) by mouth daily with supper.  . tamsulosin (FLOMAX) 0.4 MG CAPS capsule Take 0.4 mg by mouth daily.  Tyler Aas FLEXTOUCH 100 UNIT/ML SOPN FlexTouch Pen Inject 12 Units into the muscle at bedtime.   . vitamin B-12 (CYANOCOBALAMIN) 1000 MCG tablet Take 1,000 mcg by mouth daily.    . Vitamin D, Ergocalciferol, (DRISDOL) 50000 UNITS CAPS Take 50,000 Units by mouth every Sunday.   . [DISCONTINUED] amoxicillin (AMOXIL) 500 MG tablet Take 2,000 mg by mouth daily.  . [DISCONTINUED] sulfamethoxazole-trimethoprim (BACTRIM DS,SEPTRA DS) 800-160 MG tablet Take 1 tablet by mouth 2 (two) times daily.   No facility-administered encounter medications on file as of 05/30/2017.      Review of Systems  General is not complaining of any fever or chills has gained weight but suspect this is appetite related  Skin does not complain of rashes or itching-has had drainage on left leg  Head ears eyes nose mouth and throat does not  complaining of of any visual changes or sore throat.  Resp- does not complaining any shortness breath or cough.  Cardiac does not complain of chest pain leg swelling on left appears stable to improved   GI is not complaining of abdominal discomfort nausea vomiting diarrhea constipation.  GU is not playing of dysuria is indwelling Foley catheter has been removed according to nursing he is voiding well.  Musculoskeletal does have a left tibial fracture again currently has a brace t followed by orthopedics.  Neurologic is not complaining of dizziness headache or numbness.   psych does not complain of overt depression or anxiety-continues to be in good spirits looking forward to going home.   T blood pressure manually is 100/56 emperature is 98.0 respirations 20 pulse is 88  Weight is 129 pounds   Physical Exam  In general this is a frail very pleasant elderly male in no distress lying comfortably in bed  His skin is warm and dry.  Oropharynx is clear mucous membranes moist.  Heart is regular rate and rhythm cannot really appreciate a murmur gallop or rub he does have some mild left leg edema  Chest is clear to auscultation with somewhat shallow air entry there is no labored breathing.  Abdomen is soft nontender with positive bowel sounds.  GU  Foley catheter has been removed I cannot appreciate suprapubic tenderness  Musculoskeletal moves upper extremities and right lower extremity at baseline does have a brace applied to his left lower extremity Has mild left lower extremity edema --he has dressing applied above and below the knee      Neurologic grossly intact to speech is clear.  Psych he appears grossly alert and oriented pleasant and appropriate    Labs reviewed: Basic Metabolic Panel:  Recent Labs  02/27/17 0436  03/01/17 0626  03/10/17 0700  04/03/17 0700 04/07/17 0700 05/20/17 0717  NA 137  < > 139  < > 134*  < > 138 138 139  K 4.6   < > 4.0  < > 4.3  < > 5.1 4.4 3.9  CL 97*  < > 98*  < > 97*  < > 101 102 102  CO2  25  < > 27  < > 22  < > 29 30 31   GLUCOSE 334*  < > 268*  < > 328*  < > 203* 253* 77  BUN 24*  < > 17  < > 24*  < > 40* 33* 33*  CREATININE 1.14  < > 0.88  < > 1.12  < > 0.94 0.97 0.98  CALCIUM 8.3*  < > 8.1*  < > 8.0*  < > 8.9 8.9 8.8*  MG 2.1  --  2.0  --  2.0  --   --   --   --   < > = values in this interval not displayed. Liver Function Tests:  Recent Labs  03/05/17 0700 03/19/17 1412 05/20/17 0717  AST 18 19 19   ALT 14* 11* 16*  ALKPHOS 205* 167* 117  BILITOT 0.8 0.5 0.7  PROT 5.0* 5.3* 5.7*  ALBUMIN 2.4* 2.5* 2.9*   No results for input(s): LIPASE, AMYLASE in the last 8760 hours. No results for input(s): AMMONIA in the last 8760 hours. CBC:  Recent Labs  03/07/17 0700 03/19/17 1412 03/25/17 0700 05/20/17 0717  WBC 5.6 7.3 7.3 4.6  NEUTROABS 4.1 6.2 4.9  --   HGB 12.0* 9.6* 12.3* 11.6*  HCT 35.8* 28.8* 38.1* 35.3*  MCV 93.5 95.4 96.7 94.6  PLT 407* 303 337 279   Cardiac Enzymes:  Recent Labs  03/19/17 1412  TROPONINI <0.03   BNP: Invalid input(s): POCBNP CBG:  Recent Labs  03/01/17 0759 03/01/17 1139 04/09/17 1932  GLUCAP 264* 217* 220*    Procedures and Imaging Studies During Stay: Dg Tibia/fibula Left  Result Date: 05/16/2017 Radiograph report Dr. Aline Brochure dictating This is a left proximal tibia fracture AP and lateral and several other views for completion Proximal metaphyseal tibia fracture shows minimal angulation there is also a proximal fibular fracture nondisplaced There is a portion of the fracture which goes into the lateral plateau nondisplaced The lateral x-ray shows impaction of the fracture slight shortening with a small portion of the fracture showing apex anterior angulation The fracture is impacted with sclerotic bone around the fracture. There is also noted ankle in distal femur hardware Impression healing proximal tibial metaphyseal fracture with  mild apex anterior angulation  Dg Tibia/fibula Left  Result Date: 05/02/2017 Radiology report for left proximal tibia fracture Aline Brochure dictating On the left tibial x-rays we see a screw and K wire medially fibular nail laterally which are from prior fractures the tibia fracture proximally shows apex anterior angulation with impaction and there is also a hold fixation of the femoral fracture of the femoral. On the AP view we do see some callus especially medially. There is also fibular head fracture which is part of the tib-fib proximal Impression healing fracture of the proximal tibia and fibula    Assessment/Plan:    #1 history of proximal left fracture of the tibia-this is followed by orthopedics  is now off the long cast and has a brace-appears to be doing well in this regard is not really complaining of pain.-- will need followed by orthopedics  #2 history of diabetes type 1 he is on [place] 12 units as well as NovoLog sliding scale insulin with meals- Blood sugars continue to be somewhat variable somewhat lower in the evening-will need expedient follow up upon discharge [place] be follow-up here will speak with discharge nursing about this for follow-up-at this point continue current medications   #  3 history of urinary retention-- catheter has been removed he appears to be voiding better.  He is on Flomax.  #4 history of syncope-this appears to have stabilized again this has been chronic and long-term per family nonetheless this appears to be less frequent now which is encouraging.  #5 history of pneumonia this was treated previously with antibiotics she does not really have increased cough or congestion this appears to have stabilized.  #6-history of DVT and PE in 2012 he is on Xarelto this appears stable he has some mild left lower extremity edema Dopplers have been negative-  #7 history of vitamin D deficiency vitamin D level  was 67.2 on lab done in May he is on supplements.  Number 8- history of chronic kidney disease this has been stable with a creatinine of 0.8 BUN of 33 on lab done on 05/20/2017 --will update before discharge   #9 history of sacral ulcer this is almost healed per patient it is still being treated with protective dressing and followed by wound care.  #10 history of anemia suspect of chronic disease this appears to have stabilized with hemoglobin of 11.6  at one time had dipped closer to 9 but quickly responded occult blood testing was negative Will update this.  #11 history of hyperlipidemia lipid panel done earlier this month show stability with an LDL of 60  again will be somewhat conservative with his advanced age he is on Zetia  #12 history of B12 deficiency I notice he is on B12 supplementation level was 398 on lab done earlier this month   #13 history of depression he is on Remeron as well as Celexa  this appears to be quite stable  #4 left leg drainage he is completing a course of doxycycline thru July 5   . Of note he will be going home to a beach all status he will have 24-hour care with hired help he will need continued PT and OT for strengthening as well as nursing support and CNA support to help with activities of daily living as well as with his multiple medical issues including follow-up of diabetes.He is completing a course of doxycycline for the left leg drainage apparently this has improved his sugars will have to be watched closely with expedient follow-up    [id number]-of note greater than 30 minutes spent on this discharge summary-greater than 50% of time spent coordinating plan of care for numerous diagnoses   Addendum we have obtained an updated metabolic panel as well as CBC-he does show creatinine has risen somewhat up to 1.34 BUN is up somewhat to at 49-alkaline phosphatase is minimally elevated at 140 albumin is 3.1-will write an order to strongly encourage fluids  and update a metabolic panel on Monday, July 2-his CBC looks fairly unremarkable hemoglobin is 10.5 which is shown some variability ranging from 12.3 down to 9.6 recently this will need monitoring as an outpatient as well.

## 2017-06-02 ENCOUNTER — Encounter (HOSPITAL_COMMUNITY)
Admission: RE | Admit: 2017-06-02 | Discharge: 2017-06-02 | Disposition: A | Discharge status: Home / Self Care | Payer: Medicare Other | Source: Skilled Nursing Facility | Attending: Internal Medicine | Admitting: Internal Medicine

## 2017-06-02 DIAGNOSIS — D6489 Other specified anemias: Secondary | ICD-10-CM | POA: Insufficient documentation

## 2017-06-02 DIAGNOSIS — E1165 Type 2 diabetes mellitus with hyperglycemia: Secondary | ICD-10-CM | POA: Diagnosis not present

## 2017-06-02 LAB — BASIC METABOLIC PANEL
Anion gap: 8 (ref 5–15)
BUN: 33 mg/dL — AB (ref 6–20)
CALCIUM: 8.8 mg/dL — AB (ref 8.9–10.3)
CO2: 27 mmol/L (ref 22–32)
Chloride: 102 mmol/L (ref 101–111)
Creatinine, Ser: 0.94 mg/dL (ref 0.61–1.24)
GFR calc Af Amer: >60 mL/min (ref >60)
GLUCOSE: 171 mg/dL — AB (ref 65–99)
Potassium: 3.8 mmol/L (ref 3.5–5.1)
Sodium: 137 mmol/L (ref 135–145)

## 2017-06-03 ENCOUNTER — Other Ambulatory Visit: Payer: Self-pay | Admitting: Internal Medicine

## 2017-06-20 ENCOUNTER — Ambulatory Visit (INDEPENDENT_AMBULATORY_CARE_PROVIDER_SITE_OTHER): Payer: Medicare Other

## 2017-06-20 ENCOUNTER — Ambulatory Visit (INDEPENDENT_AMBULATORY_CARE_PROVIDER_SITE_OTHER): Payer: Medicare Other | Admitting: Family Medicine

## 2017-06-20 ENCOUNTER — Ambulatory Visit (INDEPENDENT_AMBULATORY_CARE_PROVIDER_SITE_OTHER): Payer: Medicare Other | Admitting: Orthopedic Surgery

## 2017-06-20 ENCOUNTER — Encounter: Payer: Self-pay | Admitting: Family Medicine

## 2017-06-20 ENCOUNTER — Encounter: Payer: Self-pay | Admitting: Orthopedic Surgery

## 2017-06-20 VITALS — BP 108/70 | Ht 65.0 in | Wt 129.8 lb

## 2017-06-20 DIAGNOSIS — S82402D Unspecified fracture of shaft of left fibula, subsequent encounter for closed fracture with routine healing: Secondary | ICD-10-CM | POA: Diagnosis not present

## 2017-06-20 DIAGNOSIS — I10 Essential (primary) hypertension: Secondary | ICD-10-CM

## 2017-06-20 DIAGNOSIS — Z7901 Long term (current) use of anticoagulants: Secondary | ICD-10-CM | POA: Diagnosis not present

## 2017-06-20 DIAGNOSIS — E7800 Pure hypercholesterolemia, unspecified: Secondary | ICD-10-CM

## 2017-06-20 DIAGNOSIS — S82202D Unspecified fracture of shaft of left tibia, subsequent encounter for closed fracture with routine healing: Secondary | ICD-10-CM | POA: Diagnosis not present

## 2017-06-20 MED ORDER — TAMSULOSIN HCL 0.4 MG PO CAPS: 0.4000 mg | ORAL_CAPSULE | Freq: Every day | ORAL | 11 refills | Status: DC

## 2017-06-20 NOTE — Patient Instructions (Signed)
Remove brace but use walker for ambulation follow-up if any problems  Apply Iodosorb daily TO  distal tibia

## 2017-06-20 NOTE — Progress Notes (Signed)
Fracture care follow-up status post close treatment of proximal tibia and fibular fracture  Chief Complaint  Patient presents with  . Follow-up    5 week recheck on left leg, DOI 02-15-17.    18 weeks post injury  The wound. The fracture site has healed nicely  He has no tenderness or motion at the fracture site  The x-ray shows fracture healing with slight apex anterior angulation  The patient can remove his brace continue his walker for ambulation  Encounter Diagnosis  Name Primary?  . Closed fracture of left tibia and fibula with routine healing, subsequent encounter Yes    Follow-up if any problems

## 2017-06-20 NOTE — Progress Notes (Signed)
   Subjective:    Patient ID: Eduardo Lee, male    DOB: 11/29/1926, 83 y.o.   MRN: [id number]  HPI Patient arrives for a follow up from recent nursing home stay for broken leg.Next  Complete hospital record and nursing home record reviewed in the presence of the patient.  Patient claims compliance with all his medications. Next  Patient's diabetes was in poor control in the nursing center but now numbers are much better. Next  Patient still has swelling in his knee but none his ankles.  Remains compliant with his relative.  Patient no longer experiencing confusion or difficulty with alertness. This occurred in the hospital. There was felt to be due primarily to urinary tract infection   Review of Systems No headache, no major weight loss or weight gain, no chest pain no back pain abdominal pain no change in bowel habits complete ROS otherwise negative     Objective:   Physical Exam  Alert and oriented, vitals reviewed and stable, NAD ENT-TM's and ext canals WNL bilat via otoscopic exam Soft palate, tonsils and post pharynx WNL via oropharyngeal exam Neck-symmetric, no masses; thyroid nonpalpable and nontender Pulmonary-no tachypnea or accessory muscle use; Clear without wheezes via auscultation Card--no abnrml murmurs, rhythm reg and rate WNL Carotid pulses symmetric, without bruits Left knee swollen      Assessment & Plan:  Impression 1 type 1 diabetes improved #2 flare of delirium in the hospital now resolved #3 hyperlipidemia discussed #4 atrial fibrillation stable #5 hypertension good control plan diet exercise discussed medications refilled follow-up in 6 months  Greater than 50% of this 25 minute face to face visit was spent in counseling and discussion and coordination of care regarding the above diagnosis/diagnosies

## 2017-06-24 ENCOUNTER — Ambulatory Visit: Payer: Medicare Other | Admitting: Urology

## 2017-06-28 ENCOUNTER — Other Ambulatory Visit: Payer: Self-pay | Admitting: Family Medicine

## 2017-07-29 ENCOUNTER — Telehealth: Payer: Self-pay | Admitting: Family Medicine

## 2017-07-29 DIAGNOSIS — B351 Tinea unguium: Secondary | ICD-10-CM | POA: Diagnosis not present

## 2017-07-29 DIAGNOSIS — E1142 Type 2 diabetes mellitus with diabetic polyneuropathy: Secondary | ICD-10-CM | POA: Diagnosis not present

## 2017-07-29 DIAGNOSIS — H353134 Nonexudative age-related macular degeneration, bilateral, advanced atrophic with subfoveal involvement: Secondary | ICD-10-CM | POA: Diagnosis not present

## 2017-07-29 DIAGNOSIS — E113593 Type 2 diabetes mellitus with proliferative diabetic retinopathy without macular edema, bilateral: Secondary | ICD-10-CM | POA: Diagnosis not present

## 2017-07-29 DIAGNOSIS — H35372 Puckering of macula, left eye: Secondary | ICD-10-CM | POA: Diagnosis not present

## 2017-07-29 DIAGNOSIS — H401131 Primary open-angle glaucoma, bilateral, mild stage: Secondary | ICD-10-CM | POA: Diagnosis not present

## 2017-07-29 NOTE — Telephone Encounter (Signed)
Pt's wife dropped off a letter from [employer] regarding the pt's medication Zetia stating that they are requiring a letter from the dr stating that this is the only medication that the pt can take. Letter is in red folder in office.

## 2017-08-10 ENCOUNTER — Other Ambulatory Visit: Payer: Self-pay | Admitting: Family Medicine

## 2017-08-10 NOTE — Telephone Encounter (Signed)
I dont recall seeing this letter yet

## 2017-08-24 IMAGING — US US EXTREM LOW VENOUS*R*
1 series · 13 of 24 positions shown · non-contrast
Comparison: Right lower extremity venous Doppler ultrasound -
11/11/2011

CLINICAL DATA: Right lower extremity pain and swelling for the past
week. History of prior DVT. Evaluate for acute or chronic DVT.



[Series 1: us extrem low venous*right* · 0.08mm/px · 13 of 35 slices shown]
[im 1/35]
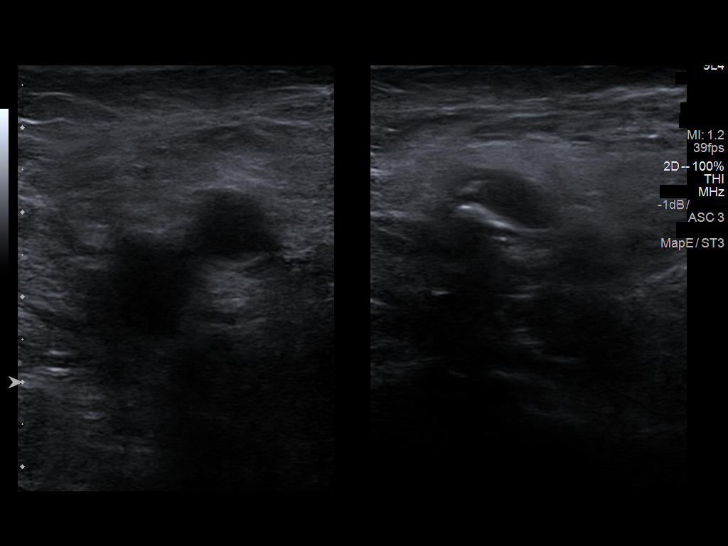
[im 3/35]
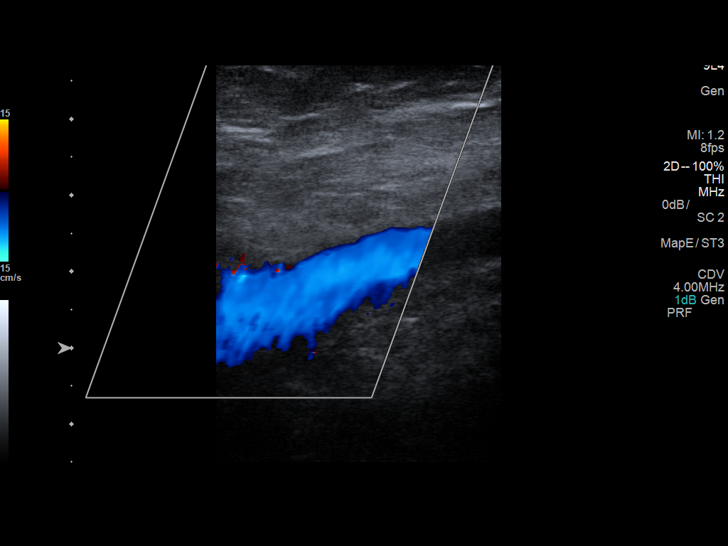
[im 6/35]
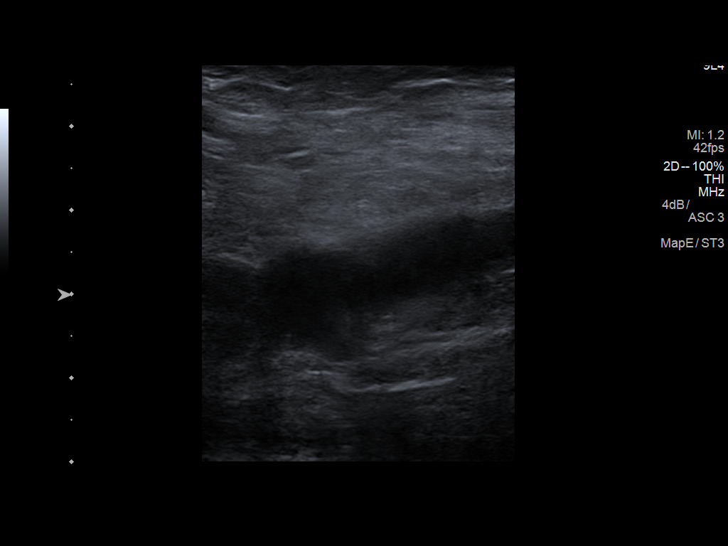
[im 9/35]
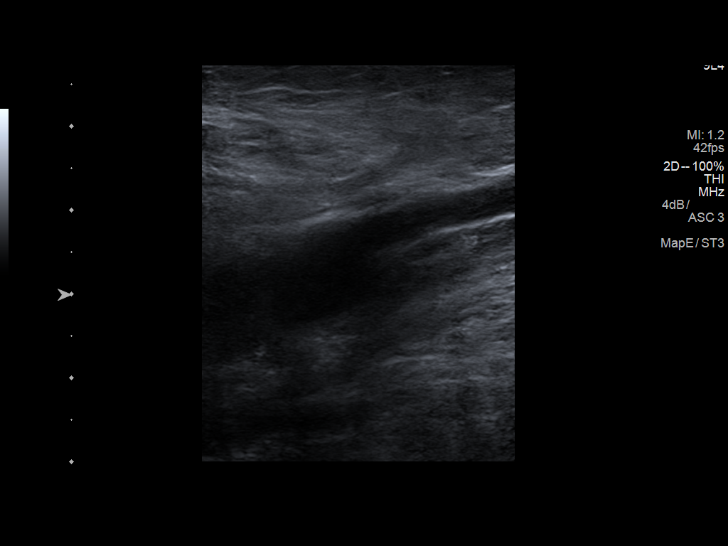
[im 12/35]
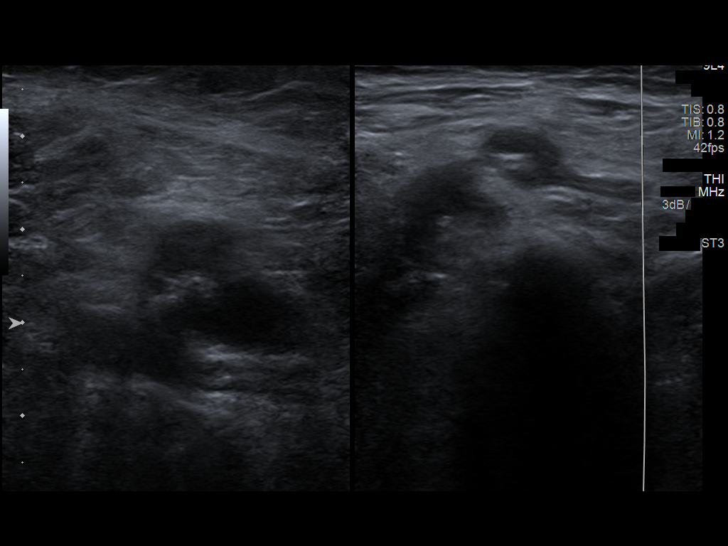
[im 15/35]
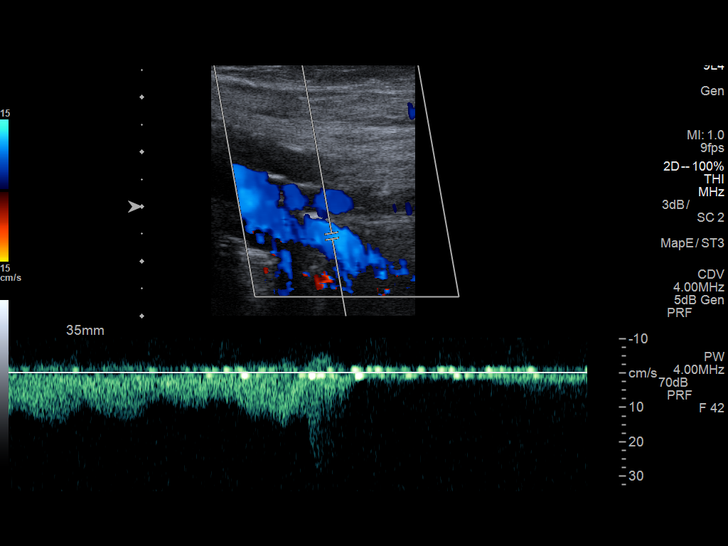
[im 18/35]
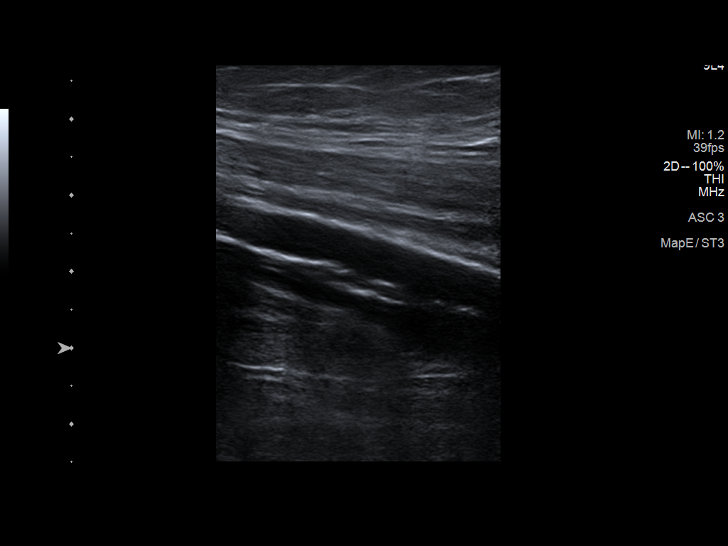
[im 20/35]
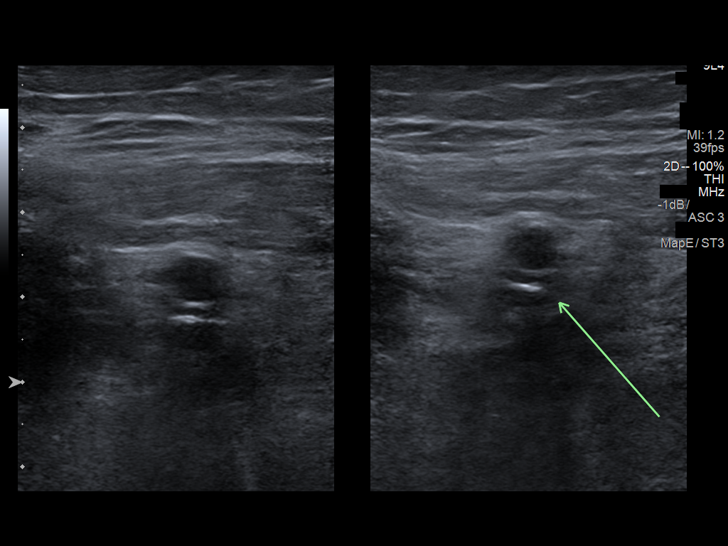
[im 23/35]
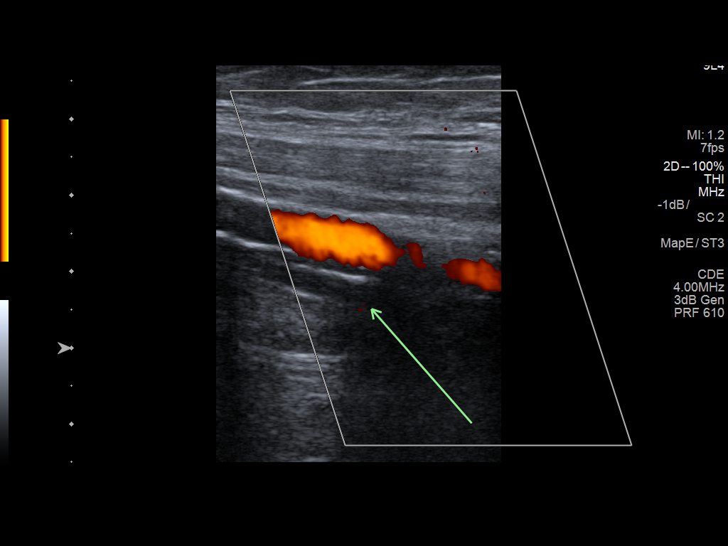
[im 26/35]
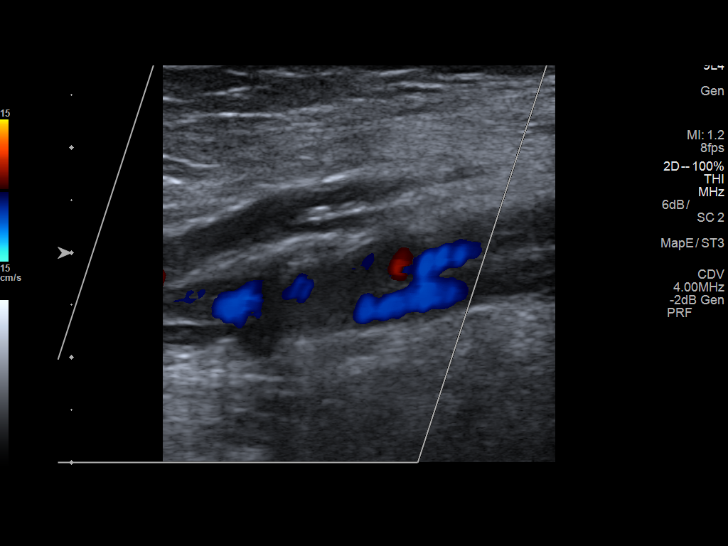
[im 29/35]
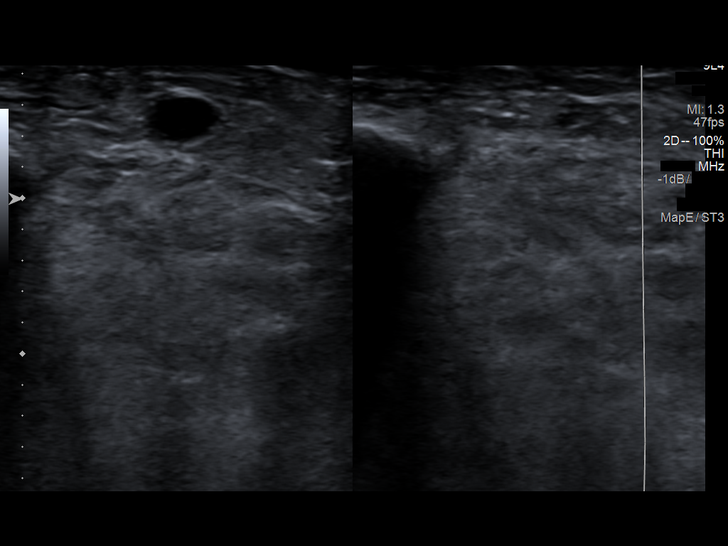
[im 32/35]
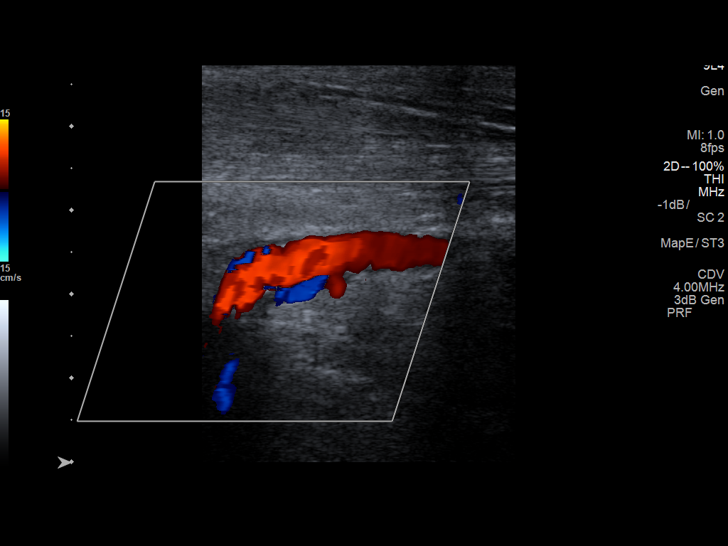
[im 35/35]
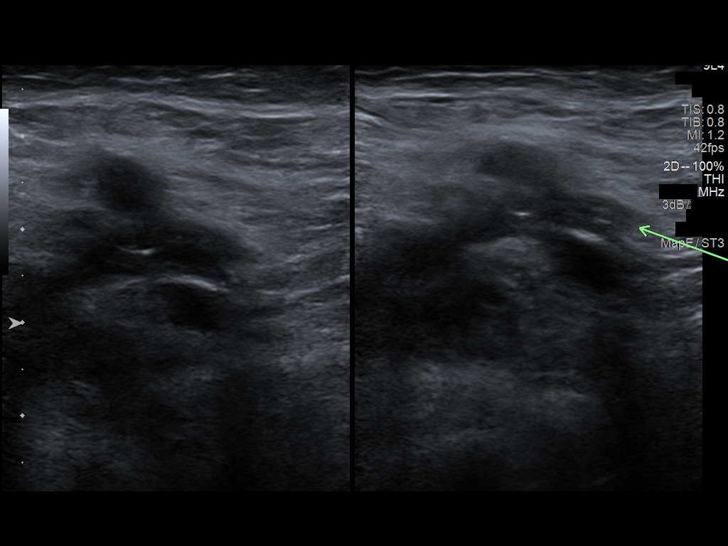

[13 of 24 positions shown; findings below may reference images not displayed]

FINDINGS: Contralateral Common Femoral Vein: Respiratory phasicity is normal
and symmetric with the symptomatic side. No evidence of thrombus.
Normal compressibility.

Common Femoral Vein: No evidence of thrombus. Normal
compressibility, respiratory phasicity and response to augmentation.

Saphenofemoral Junction: No evidence of thrombus. Normal
compressibility and flow on color Doppler imaging.

Profunda Femoral Vein: No evidence of thrombus. Normal
compressibility and flow on color Doppler imaging.

There is eccentric mixed echogenic nonocclusive thrombus within the
proximal aspect the right femoral vein with apparent re-
cannulization (image 35). There is eccentric mixed echogenic now
occlusive thrombus within the mid (images 18 and 20) and distal
(images 21 and 4444) aspects of the right femoral vein, potentially
progressed since the [DATE] examination.

There is eccentric hypoechoic nonocclusive thrombus within the right
popliteal vein with associated apparent re- cannulization
(representative image 27), potentially progressed since the [DATE]
examination.

Extending to involve the imaged portions of the right posterior
tibial (image 31) and peroneal veins.

Other Findings:  None.
IMPRESSION: Chronic mixed occlusive and nonocclusive DVT extending from the
proximal aspect of the right femoral vein through the right
posterior tibial and peroneal veins, unchanged to slightly
progressed since the [DATE] examination. As there is a potential
worsening of occlusive thrombus within the mid and distal aspects of
the right femoral vein, an acute on chronic process is not excluded
on the basis this examination. Clinical correlation is advised.

## 2017-08-25 DIAGNOSIS — E1065 Type 1 diabetes mellitus with hyperglycemia: Secondary | ICD-10-CM | POA: Diagnosis not present

## 2017-08-25 DIAGNOSIS — E7800 Pure hypercholesterolemia, unspecified: Secondary | ICD-10-CM | POA: Diagnosis not present

## 2017-08-28 DIAGNOSIS — E103593 Type 1 diabetes mellitus with proliferative diabetic retinopathy without macular edema, bilateral: Secondary | ICD-10-CM | POA: Diagnosis not present

## 2017-08-28 DIAGNOSIS — Z23 Encounter for immunization: Secondary | ICD-10-CM | POA: Diagnosis not present

## 2017-08-28 DIAGNOSIS — E7800 Pure hypercholesterolemia, unspecified: Secondary | ICD-10-CM | POA: Diagnosis not present

## 2017-08-28 DIAGNOSIS — N183 Chronic kidney disease, stage 3 (moderate): Secondary | ICD-10-CM | POA: Diagnosis not present

## 2017-08-28 DIAGNOSIS — I129 Hypertensive chronic kidney disease with stage 1 through stage 4 chronic kidney disease, or unspecified chronic kidney disease: Secondary | ICD-10-CM | POA: Diagnosis not present

## 2017-08-28 DIAGNOSIS — M810 Age-related osteoporosis without current pathological fracture: Secondary | ICD-10-CM | POA: Diagnosis not present

## 2017-08-28 DIAGNOSIS — E1042 Type 1 diabetes mellitus with diabetic polyneuropathy: Secondary | ICD-10-CM | POA: Diagnosis not present

## 2017-08-28 DIAGNOSIS — E1022 Type 1 diabetes mellitus with diabetic chronic kidney disease: Secondary | ICD-10-CM | POA: Diagnosis not present

## 2017-08-28 DIAGNOSIS — E1065 Type 1 diabetes mellitus with hyperglycemia: Secondary | ICD-10-CM | POA: Diagnosis not present

## 2017-08-28 DIAGNOSIS — E559 Vitamin D deficiency, unspecified: Secondary | ICD-10-CM | POA: Diagnosis not present

## 2017-09-23 ENCOUNTER — Other Ambulatory Visit: Payer: Self-pay | Admitting: Family Medicine

## 2017-09-23 NOTE — Telephone Encounter (Signed)
Last seen 06/20/17

## 2017-10-01 ENCOUNTER — Telehealth: Payer: Self-pay | Admitting: Family Medicine

## 2017-10-01 NOTE — Telephone Encounter (Signed)
Called pharm because we never received PA. [facility] in [facility] sent form. Pharm states generic will go through but pt wanted brand name because he said it works better. Glass blower/designer is working on [employer] today for pt.

## 2017-10-01 NOTE — Telephone Encounter (Signed)
Pt is needing refills on ezetimibe (ZETIA) 10 MG tablet Pt will be totally out tomorrow. Wife states that the prior authorization was sent over last week. Please advise.

## 2017-10-01 NOTE — Telephone Encounter (Signed)
[employer] Teacher, adult education) got PA approved. I notified pt and pharm.

## 2017-10-07 DIAGNOSIS — E1142 Type 2 diabetes mellitus with diabetic polyneuropathy: Secondary | ICD-10-CM | POA: Diagnosis not present

## 2017-10-07 DIAGNOSIS — B351 Tinea unguium: Secondary | ICD-10-CM | POA: Diagnosis not present

## 2017-11-06 ENCOUNTER — Other Ambulatory Visit: Payer: Self-pay

## 2017-11-08 ENCOUNTER — Other Ambulatory Visit: Payer: Self-pay | Admitting: Family Medicine

## 2017-12-08 ENCOUNTER — Other Ambulatory Visit: Payer: Self-pay | Admitting: Family Medicine

## 2017-12-09 DIAGNOSIS — E7800 Pure hypercholesterolemia, unspecified: Secondary | ICD-10-CM | POA: Diagnosis not present

## 2017-12-09 DIAGNOSIS — B351 Tinea unguium: Secondary | ICD-10-CM | POA: Diagnosis not present

## 2017-12-09 DIAGNOSIS — E1065 Type 1 diabetes mellitus with hyperglycemia: Secondary | ICD-10-CM | POA: Diagnosis not present

## 2017-12-09 DIAGNOSIS — E1142 Type 2 diabetes mellitus with diabetic polyneuropathy: Secondary | ICD-10-CM | POA: Diagnosis not present

## 2017-12-10 ENCOUNTER — Ambulatory Visit (INDEPENDENT_AMBULATORY_CARE_PROVIDER_SITE_OTHER): Payer: Medicare Other | Admitting: Family Medicine

## 2017-12-10 VITALS — BP 106/70 | Ht 65.0 in | Wt 121.0 lb

## 2017-12-10 DIAGNOSIS — E7800 Pure hypercholesterolemia, unspecified: Secondary | ICD-10-CM

## 2017-12-10 DIAGNOSIS — Z86718 Personal history of other venous thrombosis and embolism: Secondary | ICD-10-CM

## 2017-12-10 DIAGNOSIS — I10 Essential (primary) hypertension: Secondary | ICD-10-CM

## 2017-12-10 DIAGNOSIS — F339 Major depressive disorder, recurrent, unspecified: Secondary | ICD-10-CM | POA: Diagnosis not present

## 2017-12-10 DIAGNOSIS — R300 Dysuria: Secondary | ICD-10-CM | POA: Diagnosis not present

## 2017-12-10 MED ORDER — CITALOPRAM HYDROBROMIDE 40 MG PO TABS: 40.0000 mg | ORAL_TABLET | Freq: Every day | ORAL | 1 refills | Status: DC

## 2017-12-10 MED ORDER — RIVAROXABAN 20 MG PO TABS: ORAL_TABLET | ORAL | 5 refills | Status: DC

## 2017-12-10 NOTE — Progress Notes (Signed)
   Subjective:    Patient ID: Eduardo Lee, male    DOB: Jul 19, 1926, 84 y.o.   MRN: [id number]  Hypertension  This is a chronic problem. The current episode started more than 1 year ago. Risk factors for coronary artery disease include diabetes mellitus, dyslipidemia, male gender and sedentary lifestyle. There are no compliance problems.    Needs Refills on xarelto uses for recurrent DVT.  Along with atrial fibrillation.  Patient compliant with medication.  No obvious side effects.  Patient continues to have some nighttime hallucinations generally when sleeping or near sleeping.  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  Also of note was in the hospital for prolonged time this past year.  Had challenges with dementia and delirium at that time also  Blood pressure medicine and blood pressure levels reviewed today with patient. Compliant with blood pressure medicine. States does not miss a dose. No obvious side effects. Blood pressure generally good when checked elsewhere. Watching salt intake.   Followed by endocrinologist for diabetes.  Patient spouse reports progressive weakness and tiredness  Family also concerned about potential for urinary tract infection.  In the past this contributed to the patient's delirium/dementia.  They request that we investigate this.  Living now in [place] and comes up essentially just for doctor visits and brief family visits  Review of Systems No headache, no major weight loss or weight gain, no chest pain no back pain abdominal pain no change in bowel habits complete ROS otherwise negative     Objective:   Physical Exam  Alert and oriented, vitals reviewed and stable, NAD ENT-TM's and ext canals WNL bilat via otoscopic exam Soft palate, tonsils and post pharynx WNL via oropharyngeal exam Neck-symmetric, no masses; thyroid nonpalpable and nontender Pulmonary-no tachypnea or accessory muscle use; Clear without wheezes via  auscultation Card--no abnrml murmurs, rhythm reg and rate WNL Carotid pulses symmetric, without bruits       Assessment & Plan:  Impression 1 hypertension good control discussed maintain same meds  2.  Urinary tract infection concerns.  Urinalysis good will culture  3.  Elements of hallucinations.  Not classic for Lewy body dementia.  During the day quite oriented.  Feel patient is experiencing some sundowning along with exacerbation by blindness.  4.  Gradual debility.  Very long frank discussion held.  I think he needs a primary care doctor his primary home town in [place].  Rationale discussed.  5.  Chronic anticoagulation discussed at length.  Continue Xarelto 6 discussed  5.  Worsening depression.  Discussed.  Will increase dose of citalopram rationale discussed no suicidal thoughts.  Supportive family  Greater than 50% of this 40 minute face to face visit was spent in counseling and discussion and coordination of care regarding the above diagnosis/diagnosies

## 2017-12-11 DIAGNOSIS — N183 Chronic kidney disease, stage 3 (moderate): Secondary | ICD-10-CM | POA: Diagnosis not present

## 2017-12-11 DIAGNOSIS — E1042 Type 1 diabetes mellitus with diabetic polyneuropathy: Secondary | ICD-10-CM | POA: Diagnosis not present

## 2017-12-11 DIAGNOSIS — E7800 Pure hypercholesterolemia, unspecified: Secondary | ICD-10-CM | POA: Diagnosis not present

## 2017-12-11 DIAGNOSIS — M810 Age-related osteoporosis without current pathological fracture: Secondary | ICD-10-CM | POA: Diagnosis not present

## 2017-12-11 DIAGNOSIS — Z794 Long term (current) use of insulin: Secondary | ICD-10-CM | POA: Diagnosis not present

## 2017-12-11 DIAGNOSIS — I129 Hypertensive chronic kidney disease with stage 1 through stage 4 chronic kidney disease, or unspecified chronic kidney disease: Secondary | ICD-10-CM | POA: Diagnosis not present

## 2017-12-11 DIAGNOSIS — E1065 Type 1 diabetes mellitus with hyperglycemia: Secondary | ICD-10-CM | POA: Diagnosis not present

## 2017-12-11 DIAGNOSIS — E559 Vitamin D deficiency, unspecified: Secondary | ICD-10-CM | POA: Diagnosis not present

## 2017-12-11 DIAGNOSIS — E103593 Type 1 diabetes mellitus with proliferative diabetic retinopathy without macular edema, bilateral: Secondary | ICD-10-CM | POA: Diagnosis not present

## 2017-12-11 DIAGNOSIS — E1022 Type 1 diabetes mellitus with diabetic chronic kidney disease: Secondary | ICD-10-CM | POA: Diagnosis not present

## 2017-12-12 LAB — URINE CULTURE: Organism ID, Bacteria: NO GROWTH

## 2017-12-23 ENCOUNTER — Ambulatory Visit: Payer: Medicare Other | Admitting: Family Medicine

## 2017-12-25 ENCOUNTER — Telehealth: Payer: Self-pay | Admitting: Family Medicine

## 2017-12-25 ENCOUNTER — Encounter: Payer: Self-pay | Admitting: *Deleted

## 2017-12-25 NOTE — Telephone Encounter (Signed)
Pt's son Richardson Landry dropped off an FL2 to be filled out for the pt. Form is in nurse box.

## 2017-12-25 NOTE — Telephone Encounter (Signed)
Med list filled out and form put in dr steve's folder

## 2017-12-25 NOTE — Telephone Encounter (Signed)
Pt is hallucinating more day and night over the past couple days. Pt's friend Inez Catalina is wanting a call back regarding what they should do. Please advise.

## 2017-12-25 NOTE — Telephone Encounter (Signed)
1.  Reduce dose back to 20 mg #2 informed family Dr. Richardson Landry will be present on Friday #3 send message to Dr. Richardson Landry for further input

## 2017-12-25 NOTE — Telephone Encounter (Signed)
Discussed with Inez Catalina. She states she will cut the 40mg  pills in half. Message sent to dr Richardson Landry to review tomorrow.

## 2017-12-25 NOTE — Telephone Encounter (Signed)
hallocinations  Have Gotten worse since citalopram  was increased from 20mg  to 40mg .

## 2017-12-26 NOTE — Telephone Encounter (Signed)
Good, thx 

## 2017-12-26 NOTE — Telephone Encounter (Signed)
Patient's son stated that susie called then and stated his father can no longer stand and is very weak and having a lot of hallucinations and confusion and she don't feel se can take care of him anymore and asked for their help. They have also talked  with their father and explained it to him.  They feel he will need nursing home placement-they don't feel like he will be able to do assisted living.

## 2017-12-26 NOTE — Telephone Encounter (Signed)
Left message to return call with son 

## 2017-12-26 NOTE — Telephone Encounter (Signed)
Agree with this see other message

## 2017-12-26 NOTE — Telephone Encounter (Signed)
Just last week met with pt and girlfriend at that time semed like care was going to be mainly provided with girlf friend in her home town, ask son what current status is?, wshat is their hope with the FL2, ? is pt in agreement with all of these changes?

## 2017-12-26 NOTE — Telephone Encounter (Signed)
If No longer standing and weak and confused and worsening needs urgent eval in local ER wherever he may be right now. Call and rec this to whomever is with pt right now, or have family relay message sicne no one has called med cirectly with this info today. We will work on [place], will be ready by Monday, but unless a patient is hospitalized it often takes weeks for n h placement to occur from a community based patient and family need to be aware of this,

## 2017-12-26 NOTE — Telephone Encounter (Signed)
Spoke with son and advised per Dr Richardson Landry: If No longer standing and weak and confused and worsening needs urgent eval in local ER wherever he may be right now. Call and rec this to whomever is with pt right now, or have family relay message sicne no one has called med cirectly with this info today. We will work on [place], will be ready by Monday, but unless a patient is hospitalized it often takes weeks for n h placement to occur from a community based patient and family need to be aware of this. Son agreed and verbalized understanding and stated that the will make sure the patient goes directly to the hospital for evaluation.

## 2017-12-29 ENCOUNTER — Telehealth: Payer: Self-pay | Admitting: Family Medicine

## 2017-12-29 NOTE — Telephone Encounter (Signed)
Pt's son Duvid called and wants to know if the pt would qualify for home health. Please advise.

## 2017-12-29 NOTE — Telephone Encounter (Signed)
Sons don't feel their father is as bad as the girlfriend stated and wants to know if he could qualify for home health

## 2017-12-30 NOTE — Telephone Encounter (Signed)
Patient son [name] is aware.

## 2017-12-30 NOTE — Telephone Encounter (Signed)
Will disc at o v this wk

## 2017-12-31 ENCOUNTER — Ambulatory Visit (INDEPENDENT_AMBULATORY_CARE_PROVIDER_SITE_OTHER): Payer: Medicare Other | Admitting: Family Medicine

## 2017-12-31 ENCOUNTER — Encounter: Payer: Self-pay | Admitting: Family Medicine

## 2017-12-31 DIAGNOSIS — R443 Hallucinations, unspecified: Secondary | ICD-10-CM

## 2017-12-31 DIAGNOSIS — R29898 Other symptoms and signs involving the musculoskeletal system: Secondary | ICD-10-CM | POA: Diagnosis not present

## 2017-12-31 DIAGNOSIS — F339 Major depressive disorder, recurrent, unspecified: Secondary | ICD-10-CM | POA: Diagnosis not present

## 2017-12-31 DIAGNOSIS — I10 Essential (primary) hypertension: Secondary | ICD-10-CM | POA: Diagnosis not present

## 2017-12-31 NOTE — Progress Notes (Signed)
   Subjective:    Patient ID: Eduardo Lee, male    DOB: [birth year], 84 y.o.   MRN: [id number]  HPIpt arrives with son Eduardo Lee. Would like to discuss getting referral to home health. Looked into the [facility] and they are full. He is on waiting list.   Pt was having hallucinations last week. Better now. Son thinks he might have been dehydrated.   Sees diabetic specialist.  Patient continues to have challenges with walking.  Was using a walker.  Now having even more challenges with that.  Mostly in a wheelchair now.  Also significant challenges with social situation.  Up until 2 weeks ago was with girlfriend in [place].  She has reached a point where she no longer can take care of him.  In a frank discussion the patient states hopes to getting back with his girlfriend and continuing their traveling lifestyle.  The patient's son in the room expresses concern that he has now become a day and night management situation and that the family feels he needs to be in a nursing home.  The patient did admit that he has reached a point where he can no longer continue same lifestyle he was doing before.  He still hopes to stay out of the nursing home  Family also request home health referral.  Along with potentially physical therapy.     Review of Systems No headache, no major weight loss or weight gain, no chest pain no back pain abdominal pain no change in bowel habits complete ROS otherwise negative     Objective:   Physical Exam Alert active thin patient oriented x3 today HEENT normal lungs no wheezes or crackles heart regular rate and rhythm.  Very unsteady gait.  Substantial weakness.  No current hallucinations evident.       Assessment & Plan:  Impression progressive gait abnormality along with hallucinations.  Patient has been experiencing hallucinations for a year now.  Not a classic presentation for Lewy body disease however.  Brain scan 1 year ago not helpful.  With  progressive neurological decline I think a neurologist reassessment would be a good idea at this time.  We will proceed with this  2.  Placement concerns.  Extremely long discussion held in presence of both patient and family in this regard.  Patient becoming pretty much a round-the-clock responsibility and family feels he needs nursing center soon.  Patient understandably reluctant to go into nursing home understand this time he may well need that level of support.  3.  Home support.  Family request home health referral which I think is reasonable.  Hopefully will qualify for this along with a round of physical therapy to improve gait training and leg strength  Greater than 50% of this 40 minute face to face visit was spent in counseling and discussion and coordination of care regarding the above diagnosis/diagnosies   Follow-up as scheduled

## 2018-01-01 ENCOUNTER — Other Ambulatory Visit: Payer: Self-pay | Admitting: *Deleted

## 2018-01-01 DIAGNOSIS — R29898 Other symptoms and signs involving the musculoskeletal system: Secondary | ICD-10-CM

## 2018-01-01 DIAGNOSIS — R443 Hallucinations, unspecified: Secondary | ICD-10-CM

## 2018-01-01 DIAGNOSIS — R270 Ataxia, unspecified: Secondary | ICD-10-CM

## 2018-01-01 DIAGNOSIS — R2681 Unsteadiness on feet: Secondary | ICD-10-CM

## 2018-01-01 NOTE — Addendum Note (Signed)
Addended by: Mikey Kirschner on: 01/01/2018 09:49 AM   Modules accepted: Orders

## 2018-01-06 DIAGNOSIS — H3530 Unspecified macular degeneration: Secondary | ICD-10-CM | POA: Diagnosis not present

## 2018-01-06 DIAGNOSIS — M6281 Muscle weakness (generalized): Secondary | ICD-10-CM | POA: Diagnosis not present

## 2018-01-06 DIAGNOSIS — Z8781 Personal history of (healed) traumatic fracture: Secondary | ICD-10-CM | POA: Diagnosis not present

## 2018-01-06 DIAGNOSIS — R443 Hallucinations, unspecified: Secondary | ICD-10-CM | POA: Diagnosis not present

## 2018-01-06 DIAGNOSIS — E119 Type 2 diabetes mellitus without complications: Secondary | ICD-10-CM | POA: Diagnosis not present

## 2018-01-06 DIAGNOSIS — Z9181 History of falling: Secondary | ICD-10-CM | POA: Diagnosis not present

## 2018-01-06 DIAGNOSIS — M1990 Unspecified osteoarthritis, unspecified site: Secondary | ICD-10-CM | POA: Diagnosis not present

## 2018-01-06 DIAGNOSIS — Z794 Long term (current) use of insulin: Secondary | ICD-10-CM | POA: Diagnosis not present

## 2018-01-06 DIAGNOSIS — R296 Repeated falls: Secondary | ICD-10-CM | POA: Diagnosis not present

## 2018-01-06 DIAGNOSIS — Z7901 Long term (current) use of anticoagulants: Secondary | ICD-10-CM | POA: Diagnosis not present

## 2018-01-07 ENCOUNTER — Telehealth: Payer: Self-pay | Admitting: Family Medicine

## 2018-01-07 ENCOUNTER — Encounter: Payer: Self-pay | Admitting: Family Medicine

## 2018-01-07 DIAGNOSIS — H3530 Unspecified macular degeneration: Secondary | ICD-10-CM | POA: Diagnosis not present

## 2018-01-07 DIAGNOSIS — E119 Type 2 diabetes mellitus without complications: Secondary | ICD-10-CM | POA: Diagnosis not present

## 2018-01-07 DIAGNOSIS — R296 Repeated falls: Secondary | ICD-10-CM | POA: Diagnosis not present

## 2018-01-07 DIAGNOSIS — M1990 Unspecified osteoarthritis, unspecified site: Secondary | ICD-10-CM | POA: Diagnosis not present

## 2018-01-07 DIAGNOSIS — R443 Hallucinations, unspecified: Secondary | ICD-10-CM | POA: Diagnosis not present

## 2018-01-07 DIAGNOSIS — M6281 Muscle weakness (generalized): Secondary | ICD-10-CM | POA: Diagnosis not present

## 2018-01-07 NOTE — Telephone Encounter (Signed)
Eduardo Lee, PT with [facility] needs verbal "OK' for plan of care  Reducing fall risk with balance exercises, therapeutic exercise for strengthening, gait training  2x week/ 5 weeks then once a week for 2 weeks  Please advise & OK to leave message on secure voicemail  Meredeth Ide, [facility], [place]

## 2018-01-07 NOTE — Telephone Encounter (Signed)
ok 

## 2018-01-08 ENCOUNTER — Telehealth: Payer: Self-pay | Admitting: Family Medicine

## 2018-01-08 NOTE — Telephone Encounter (Signed)
Danny w/[facility] called to request verbal "ok" for pt to get an OT eval   Please advise  Meredeth Ide - [phone]

## 2018-01-08 NOTE — Telephone Encounter (Signed)
Verbal order given to Danny at [facility]

## 2018-01-08 NOTE — Telephone Encounter (Signed)
ok 

## 2018-01-08 NOTE — Telephone Encounter (Signed)
Verbal Order given to [place] at [facility]

## 2018-01-09 DIAGNOSIS — M1990 Unspecified osteoarthritis, unspecified site: Secondary | ICD-10-CM | POA: Diagnosis not present

## 2018-01-09 DIAGNOSIS — R443 Hallucinations, unspecified: Secondary | ICD-10-CM | POA: Diagnosis not present

## 2018-01-09 DIAGNOSIS — R296 Repeated falls: Secondary | ICD-10-CM | POA: Diagnosis not present

## 2018-01-09 DIAGNOSIS — H3530 Unspecified macular degeneration: Secondary | ICD-10-CM | POA: Diagnosis not present

## 2018-01-09 DIAGNOSIS — M6281 Muscle weakness (generalized): Secondary | ICD-10-CM | POA: Diagnosis not present

## 2018-01-09 DIAGNOSIS — E119 Type 2 diabetes mellitus without complications: Secondary | ICD-10-CM | POA: Diagnosis not present

## 2018-01-12 ENCOUNTER — Encounter: Payer: Self-pay | Admitting: Neurology

## 2018-01-12 ENCOUNTER — Ambulatory Visit (INDEPENDENT_AMBULATORY_CARE_PROVIDER_SITE_OTHER): Payer: Medicare Other | Admitting: Neurology

## 2018-01-12 VITALS — BP 120/56 | HR 83 | Ht 67.0 in | Wt 130.0 lb

## 2018-01-12 DIAGNOSIS — R443 Hallucinations, unspecified: Secondary | ICD-10-CM | POA: Diagnosis not present

## 2018-01-12 DIAGNOSIS — R296 Repeated falls: Secondary | ICD-10-CM | POA: Diagnosis not present

## 2018-01-12 DIAGNOSIS — M6281 Muscle weakness (generalized): Secondary | ICD-10-CM | POA: Diagnosis not present

## 2018-01-12 DIAGNOSIS — R270 Ataxia, unspecified: Secondary | ICD-10-CM

## 2018-01-12 DIAGNOSIS — M1990 Unspecified osteoarthritis, unspecified site: Secondary | ICD-10-CM | POA: Diagnosis not present

## 2018-01-12 DIAGNOSIS — R441 Visual hallucinations: Secondary | ICD-10-CM

## 2018-01-12 DIAGNOSIS — H3530 Unspecified macular degeneration: Secondary | ICD-10-CM | POA: Diagnosis not present

## 2018-01-12 DIAGNOSIS — F0390 Unspecified dementia without behavioral disturbance: Secondary | ICD-10-CM

## 2018-01-12 DIAGNOSIS — F03A0 Unspecified dementia, mild, without behavioral disturbance, psychotic disturbance, mood disturbance, and anxiety: Secondary | ICD-10-CM

## 2018-01-12 DIAGNOSIS — G629 Polyneuropathy, unspecified: Secondary | ICD-10-CM

## 2018-01-12 DIAGNOSIS — E119 Type 2 diabetes mellitus without complications: Secondary | ICD-10-CM | POA: Diagnosis not present

## 2018-01-12 NOTE — Patient Instructions (Addendum)
1. Schedule head CT without contrast  We have sent a referral to [facility] for your CT and they will call you directly to schedule your appt. They are located at [place]. If you need to contact them directly please call [phone].   2. Continue with PT and using wheelchair for mobility, continue 24/7 care 3. Follow-up in 6 months, call for any changes  FALL PRECAUTIONS: Be cautious when walking. Scan the area for obstacles that may increase the risk of trips and falls. When getting up in the mornings, sit up at the edge of the bed for a few minutes before getting out of bed. Consider elevating the bed at the head end to avoid drop of blood pressure when getting up. Walk always in a well-lit room (use night lights in the walls). Avoid area rugs or power cords from appliances in the middle of the walkways. Use a walker or a cane if necessary and consider physical therapy for balance exercise. Get your eyesight checked regularly.  FINANCIAL OVERSIGHT: Supervision, especially oversight when making financial decisions or transactions is also recommended.  HOME SAFETY: Consider the safety of the kitchen when operating appliances like stoves, microwave oven, and blender. Consider having supervision and share cooking responsibilities until no longer able to participate in those. Accidents with firearms and other hazards in the house should be identified and addressed as well.  DRIVING: Regarding driving, in patients with progressive memory problems, driving will be impaired. We advise to have someone else do the driving if trouble finding directions or if minor accidents are reported. Independent driving assessment is available to determine safety of driving.  ABILITY TO BE LEFT ALONE: If patient is unable to contact 911 operator, consider using LifeLine, or when the need is there, arrange for someone to stay with patients. Smoking is a fire hazard, consider supervision or cessation.  Risk of wandering should be assessed by caregiver and if detected at any point, supervision and safe proof recommendations should be instituted.  MEDICATION SUPERVISION: Inability to self-administer medication needs to be constantly addressed. Implement a mechanism to ensure safe administration of the medications.  RECOMMENDATIONS FOR ALL PATIENTS WITH MEMORY PROBLEMS: 1. Continue to exercise (Recommend 30 minutes of walking everyday, or 3 hours every week) 2. Increase social interactions - continue going to [place] and enjoy social gatherings with friends and family 3. Eat healthy, avoid fried foods and eat more fruits and vegetables 4. Maintain adequate blood pressure, blood sugar, and blood cholesterol level. Reducing the risk of stroke and cardiovascular disease also helps promoting better memory. 5. Avoid stressful situations. Live a simple life and avoid aggravations. Organize your time and prepare for the next day in anticipation. 6. Sleep well, avoid any interruptions of sleep and avoid any distractions in the bedroom that may interfere with adequate sleep quality 7. Avoid sugar, avoid sweets as there is a strong link between excessive sugar intake, diabetes, and cognitive impairment The Mediterranean diet has been shown to help patients reduce the risk of progressive memory disorders and reduces cardiovascular risk. This includes eating fish, eat fruits and green leafy vegetables, nuts like almonds and hazelnuts, walnuts, and also use olive oil. Avoid fast foods and fried foods as much as possible. Avoid sweets and sugar as sugar use has been linked to worsening of memory function.  There is always a concern of gradual progression of memory problems. If this is the case, then we may need to adjust level of care according to  patient needs. Support, both to the patient and caregiver, should then be put into place.

## 2018-01-12 NOTE — Progress Notes (Signed)
NEUROLOGY CONSULTATION NOTE  Eduardo Lee MRN: [id number] DOB: 1926/01/22  Referring provider: Dr. Baltazar Apo Primary care provider: Dr. Baltazar Apo  Reason for consult:  Persistent and worsening leg weakness, ataxic gait, rare hallucinations, dementia  Dear Dr Wolfgang Phoenix:  Thank you for your kind referral of Eduardo Lee for consultation of the above symptoms. Although his history is well known to you, please allow me to reiterate it for the purpose of our medical record. The patient was accompanied to the clinic by his son who also provides collateral information. Records and images were personally reviewed where available.  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 He feels his memory is still pretty good, he forgets the day of the week, but otherwise feels it is okay. His son feels his memory is "not gone that bad." His son feels his mind is real sharp, he had "temporary dementia" after a fall last March 2018 with confusion and UTI, confusion cleared up after antibiotic treatment. His family has been helping him with medications for the past 3-4 years, mostly because his hands have bad shaking. His family monitors his blood sugars, and reports they are up and down, last HbAc1 in June 2018 was 7.9. He has always lived with family, and briefly for a few years with his significant other in [place], until 2 weeks ago. He is now living with his other son. Family took over bill payments 7-8 years ago because he was "on the go," back and forth between [facility] and [place]. His son reports hallucinations when he had the UTI last March 2018, and a few while he was in [place], he would describe a Runner, broadcasting/film/video coming  through the house and going through the ceiling. His son states that they have not seen any hallucinations since moving back to [place] 2 weeks ago . He has had progressive leg weakness that started around 4 years ago. He has numbness in both feet. He also describes occasional burning in his feet. He has numbness and paresthesias in both hands ("terrible"). He has dizziness every time he tries to stand up, "like I try to black out." His vision is blurred, he is "close to a lick of blind." He stopped driving many years ago. He has occasional trouble swallowing, he gets strangled easily. He has a lot of neck and back pain. He takes a laxative for incontinence. He has tremors in both hands occasionally. No anosmia. He has very seldom pains behind his right eye. He mostly gets around with his wheelchair or walker (very unsteady without walker). There is no family history of dementia. No history of concussions or alcohol use. He fractured his left femur in March 2018 after he tripped in a bar. His son denies any personality changes. Sleep is good. He has a caregiver coming daily, he has 24/7 care and started PT.  Laboratory Data: Lab Results  Component Value Date   HGBA1C 7.9 (H) 05/20/2017    Lab Results  Component Value Date   [id number] 751 05/20/2017     PAST MEDICAL HISTORY: Past Medical History:  Diagnosis Date  . Diabetes mellitus   . DVT (deep venous thrombosis) ([place])   . Gastroparesis   . Hypertension   . Legally blind  diabetic retinopathy  . Neuropathy   . Osteoporosis   . Pulmonary embolism ([place]) 12/2010   on blood thinners  . Stroke ([place])   . Weight loss     PAST SURGICAL HISTORY: Past Surgical History:  Procedure Laterality Date  . ANKLE SURGERY     Patient states that he had pins place in the left foot  . BACK SURGERY  2008  . CARPAL TUNNEL RELEASE  08/2011   Left hand  . COLONOSCOPY  01/2011  . ORIF HIP FRACTURE  10/31/2011   Procedure: OPEN REDUCTION INTERNAL  FIXATION HIP;  Surgeon: Arther Abbott, MD;  Location: [facility];  Service: Orthopedics;  Laterality: Right;  with Gamma Nail  . TRANSURETHRAL RESECTION OF PROSTATE  1984  . UPPER GASTROINTESTINAL ENDOSCOPY  01/2011    MEDICATIONS: Current Outpatient Medications on File Prior to Visit  Medication Sig Dispense Refill  . acetaminophen (TYLENOL) 325 MG tablet Take 650 mg by mouth every 4 (four) hours as needed for mild pain or moderate pain.     Marland Kitchen aspirin 81 MG tablet Take 81 mg by mouth every morning.     . citalopram (CELEXA) 40 MG tablet Take 1 tablet (40 mg total) by mouth daily. 90 tablet 1  . insulin aspart (NOVOLOG) 100 UNIT/ML injection Per sliding scale before meals    . latanoprost (XALATAN) 0.005 % ophthalmic solution Place 1 drop into both eyes at bedtime.     . Multiple Vitamin (MULTIVITAMIN WITH MINERALS) TABS tablet Take 1 tablet by mouth daily.    . polyethylene glycol powder (GLYCOLAX/MIRALAX) powder Take 34 g by mouth every morning.    . rivaroxaban (XARELTO) 20 MG TABS tablet TAKE 1 TABLET BY MOUTH DAILY WITH SUPPER. 30 tablet 5  . tamsulosin (FLOMAX) 0.4 MG CAPS capsule Take 1 capsule (0.4 mg total) by mouth daily. 30 capsule 11  . TRESIBA FLEXTOUCH 100 UNIT/ML SOPN FlexTouch Pen Inject 12 Units into the muscle at bedtime.   98  . vitamin B-12 (CYANOCOBALAMIN) 1000 MCG tablet Take 1,000 mcg by mouth daily.      . Vitamin D, Ergocalciferol, (DRISDOL) 50000 UNITS CAPS Take 50,000 Units by mouth every Sunday.     Marland Kitchen ZETIA 10 MG tablet TAKE 1 TABLET(10 MG) BY MOUTH AT BEDTIME 90 tablet 0   No current facility-administered medications on file prior to visit.     ALLERGIES: No Known Allergies  FAMILY HISTORY: Family History  Problem Relation Age of Onset  . Diabetes Mother   . Diabetes Father   . Diabetes Sister   . Diabetes Brother   . Diabetes Sister   . Diabetes Son   . Healthy Son   . Healthy Son     SOCIAL HISTORY: Social History   Socioeconomic History  .  Marital status: Divorced    Spouse name: Not on file  . Number of children: Not on file  . Years of education: Not on file  . Highest education level: Not on file  Social Needs  . Financial resource strain: Not on file  . Food insecurity - worry: Not on file  . Food insecurity - inability: Not on file  . Transportation needs - medical: Not on file  . Transportation needs - non-medical: Not on file  Occupational History  . Not on file  Tobacco Use  . Smoking status: Former Smoker    Years: 20.00    Types: Cigars    Last attempt to quit: 10/27/1981    Years since  quitting: 36.2  . Smokeless tobacco: Never Used  Substance and Sexual Activity  . Alcohol use: No    Alcohol/week: 0.0 oz    Comment: Drinks Scotch 3 times per week prior to [employer]- hasn't drank x 1 month  . Drug use: No  . Sexual activity: No  Other Topics Concern  . Not on file  Social History Narrative   Pt lives in 1 story home with his son and sons family   Has 3 adult children   High school graduate   Retired Regulatory affairs officer    REVIEW OF SYSTEMS: Constitutional: No fevers, chills, or sweats, no generalized fatigue, change in appetite Eyes: No visual changes, double vision, eye pain Ear, nose and throat: No hearing loss, ear pain, nasal congestion, sore throat Cardiovascular: No chest pain, palpitations Respiratory:  No shortness of breath at rest or with exertion, wheezes GastrointestinaI: No nausea, vomiting, diarrhea, abdominal pain, fecal incontinence Genitourinary:  No dysuria, urinary retention or frequency Musculoskeletal:  + neck pain, back pain Integumentary: No rash, pruritus, skin lesions Neurological: as above Psychiatric: No depression, insomnia, anxiety Endocrine: No palpitations, fatigue, diaphoresis, mood swings, change in appetite, change in weight, increased thirst Hematologic/Lymphatic:  No anemia, purpura, petechiae. Allergic/Immunologic: no itchy/runny eyes, nasal congestion, recent  allergic reactions, rashes  PHYSICAL EXAM: Vitals:   01/12/18 0852  BP: (!) 120/56  Pulse: 83  SpO2: 96%   General: No acute distress, sitting on wheelchair Head:  Normocephalic/atraumatic Eyes: unable to visualize fundi Neck: supple, no paraspinal tenderness, full range of motion Back: No paraspinal tenderness Heart: regular rate and rhythm Lungs: Clear to auscultation bilaterally. Vascular: No carotid bruits. Skin/Extremities: No rash, no edema Neurological Exam: Mental status: alert and oriented to person, place, and time, no dysarthria or aphasia, Fund of knowledge is appropriate.  Recent and remote memory are intact.  Attention and concentration are normal.    Able to name objects and repeat phrases.  MMSE - Mini Mental State Exam 01/12/2018  Orientation to time 3  Orientation to Place 5  Registration 3  Attention/ Calculation 3  Recall 3  Language- name 2 objects 2  Language- repeat 1  Language- follow 3 step command 3  Language- read & follow direction - (legally blind)  Write a sentence -  Copy design -  Total score 23/27   Cranial nerves: CN I: not tested CN II: pupils pinpoint, slight reaction to light, visual fields appear intact with note of reduced visual acuity (counting fingers),unable to visualize fundi CN III, IV, VI:  full range of motion, no nystagmus, no ptosis CN V: facial sensation intact CN VII: upper and lower face symmetric CN VIII: hearing intact to finger rub CN IX, X: gag intact, uvula midline CN XI: sternocleidomastoid and trapezius muscles intact CN XII: tongue midline Bulk & Tone: atrophy in both LE, slightly increased tone in both LE, no fasciculations, no cogwheeling Motor: 5/5 throughout with no pronator drift. Sensation: decreased cold and pin on left UE, decreased cold, pin, vibration to knees bilaterally. 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 His exam shows evidence of a length-dependent neuropathy, gait symptoms suggestive of sensory ataxia from neuropathy. We discussed doing an EMG/NCV, however agreed to hold off at this point. We discussed how neuropathy can be progressive, and the need for using wheelchair majority of the time. Continue with PT. He also describes dizziness upon standing, suggestive of orthostatic hypotension (autonomic dysfunction can also be seen with diabetes), orthostatic vital signs were not checked today as he cannot stand without his walker, continue to monitor BP. His MMSE is 23/27 (legally blind), history suggestive of mild dementia, etiology unclear. He is noted to have possible cerebellar ataxia as well, head CT without contrast will be ordered to assess for underlying structural abnormality. Family reports intermittent hallucinations, worse when he had a UTI and some while he was in [facility], none since living with his son recently, continue to monitor. He will follow-up in 6 months and knows to call for any changes.   Thank you for allowing me to participate in the care of this patient. Please do not hesitate to call for any questions or concerns.   Ellouise Newer, M.D.  CC: Dr. Wolfgang Phoenix

## 2018-01-13 ENCOUNTER — Telehealth: Payer: Self-pay | Admitting: Family Medicine

## 2018-01-13 DIAGNOSIS — R443 Hallucinations, unspecified: Secondary | ICD-10-CM | POA: Diagnosis not present

## 2018-01-13 DIAGNOSIS — M1990 Unspecified osteoarthritis, unspecified site: Secondary | ICD-10-CM | POA: Diagnosis not present

## 2018-01-13 DIAGNOSIS — H3530 Unspecified macular degeneration: Secondary | ICD-10-CM | POA: Diagnosis not present

## 2018-01-13 DIAGNOSIS — M6281 Muscle weakness (generalized): Secondary | ICD-10-CM | POA: Diagnosis not present

## 2018-01-13 DIAGNOSIS — E119 Type 2 diabetes mellitus without complications: Secondary | ICD-10-CM | POA: Diagnosis not present

## 2018-01-13 DIAGNOSIS — R296 Repeated falls: Secondary | ICD-10-CM | POA: Diagnosis not present

## 2018-01-13 NOTE — Telephone Encounter (Signed)
Verbal orders given to [place] at [facility].

## 2018-01-13 NOTE — Telephone Encounter (Signed)
Needs verbal orders for Home Health Occupational therapy 2x a week for 3 weeks, then 1 x a week for one week.   Sharyn Lull @ [facility]   [phone]

## 2018-01-13 NOTE — Telephone Encounter (Signed)
ok 

## 2018-01-14 DIAGNOSIS — M1990 Unspecified osteoarthritis, unspecified site: Secondary | ICD-10-CM | POA: Diagnosis not present

## 2018-01-14 DIAGNOSIS — E119 Type 2 diabetes mellitus without complications: Secondary | ICD-10-CM | POA: Diagnosis not present

## 2018-01-14 DIAGNOSIS — R296 Repeated falls: Secondary | ICD-10-CM | POA: Diagnosis not present

## 2018-01-14 DIAGNOSIS — R443 Hallucinations, unspecified: Secondary | ICD-10-CM | POA: Diagnosis not present

## 2018-01-14 DIAGNOSIS — H3530 Unspecified macular degeneration: Secondary | ICD-10-CM | POA: Diagnosis not present

## 2018-01-14 DIAGNOSIS — M6281 Muscle weakness (generalized): Secondary | ICD-10-CM | POA: Diagnosis not present

## 2018-01-16 DIAGNOSIS — R296 Repeated falls: Secondary | ICD-10-CM | POA: Diagnosis not present

## 2018-01-16 DIAGNOSIS — R443 Hallucinations, unspecified: Secondary | ICD-10-CM | POA: Diagnosis not present

## 2018-01-16 DIAGNOSIS — M6281 Muscle weakness (generalized): Secondary | ICD-10-CM | POA: Diagnosis not present

## 2018-01-16 DIAGNOSIS — H3530 Unspecified macular degeneration: Secondary | ICD-10-CM | POA: Diagnosis not present

## 2018-01-16 DIAGNOSIS — M1990 Unspecified osteoarthritis, unspecified site: Secondary | ICD-10-CM | POA: Diagnosis not present

## 2018-01-16 DIAGNOSIS — E119 Type 2 diabetes mellitus without complications: Secondary | ICD-10-CM | POA: Diagnosis not present

## 2018-01-19 DIAGNOSIS — M1990 Unspecified osteoarthritis, unspecified site: Secondary | ICD-10-CM | POA: Diagnosis not present

## 2018-01-19 DIAGNOSIS — E119 Type 2 diabetes mellitus without complications: Secondary | ICD-10-CM | POA: Diagnosis not present

## 2018-01-19 DIAGNOSIS — M6281 Muscle weakness (generalized): Secondary | ICD-10-CM | POA: Diagnosis not present

## 2018-01-19 DIAGNOSIS — R443 Hallucinations, unspecified: Secondary | ICD-10-CM | POA: Diagnosis not present

## 2018-01-19 DIAGNOSIS — H3530 Unspecified macular degeneration: Secondary | ICD-10-CM | POA: Diagnosis not present

## 2018-01-19 DIAGNOSIS — R296 Repeated falls: Secondary | ICD-10-CM | POA: Diagnosis not present

## 2018-01-21 DIAGNOSIS — E119 Type 2 diabetes mellitus without complications: Secondary | ICD-10-CM | POA: Diagnosis not present

## 2018-01-21 DIAGNOSIS — H3530 Unspecified macular degeneration: Secondary | ICD-10-CM | POA: Diagnosis not present

## 2018-01-21 DIAGNOSIS — R443 Hallucinations, unspecified: Secondary | ICD-10-CM | POA: Diagnosis not present

## 2018-01-21 DIAGNOSIS — R296 Repeated falls: Secondary | ICD-10-CM | POA: Diagnosis not present

## 2018-01-21 DIAGNOSIS — M6281 Muscle weakness (generalized): Secondary | ICD-10-CM | POA: Diagnosis not present

## 2018-01-21 DIAGNOSIS — M1990 Unspecified osteoarthritis, unspecified site: Secondary | ICD-10-CM | POA: Diagnosis not present

## 2018-01-23 DIAGNOSIS — M1990 Unspecified osteoarthritis, unspecified site: Secondary | ICD-10-CM | POA: Diagnosis not present

## 2018-01-23 DIAGNOSIS — M6281 Muscle weakness (generalized): Secondary | ICD-10-CM | POA: Diagnosis not present

## 2018-01-23 DIAGNOSIS — R443 Hallucinations, unspecified: Secondary | ICD-10-CM | POA: Diagnosis not present

## 2018-01-23 DIAGNOSIS — E119 Type 2 diabetes mellitus without complications: Secondary | ICD-10-CM | POA: Diagnosis not present

## 2018-01-23 DIAGNOSIS — R296 Repeated falls: Secondary | ICD-10-CM | POA: Diagnosis not present

## 2018-01-23 DIAGNOSIS — H3530 Unspecified macular degeneration: Secondary | ICD-10-CM | POA: Diagnosis not present

## 2018-01-26 DIAGNOSIS — H3530 Unspecified macular degeneration: Secondary | ICD-10-CM | POA: Diagnosis not present

## 2018-01-26 DIAGNOSIS — E119 Type 2 diabetes mellitus without complications: Secondary | ICD-10-CM | POA: Diagnosis not present

## 2018-01-26 DIAGNOSIS — M6281 Muscle weakness (generalized): Secondary | ICD-10-CM | POA: Diagnosis not present

## 2018-01-26 DIAGNOSIS — R296 Repeated falls: Secondary | ICD-10-CM | POA: Diagnosis not present

## 2018-01-26 DIAGNOSIS — R443 Hallucinations, unspecified: Secondary | ICD-10-CM | POA: Diagnosis not present

## 2018-01-26 DIAGNOSIS — M1990 Unspecified osteoarthritis, unspecified site: Secondary | ICD-10-CM | POA: Diagnosis not present

## 2018-01-27 DIAGNOSIS — M1990 Unspecified osteoarthritis, unspecified site: Secondary | ICD-10-CM | POA: Diagnosis not present

## 2018-01-27 DIAGNOSIS — R296 Repeated falls: Secondary | ICD-10-CM | POA: Diagnosis not present

## 2018-01-27 DIAGNOSIS — R443 Hallucinations, unspecified: Secondary | ICD-10-CM | POA: Diagnosis not present

## 2018-01-27 DIAGNOSIS — E119 Type 2 diabetes mellitus without complications: Secondary | ICD-10-CM | POA: Diagnosis not present

## 2018-01-27 DIAGNOSIS — H3530 Unspecified macular degeneration: Secondary | ICD-10-CM | POA: Diagnosis not present

## 2018-01-27 DIAGNOSIS — M6281 Muscle weakness (generalized): Secondary | ICD-10-CM | POA: Diagnosis not present

## 2018-01-28 DIAGNOSIS — E119 Type 2 diabetes mellitus without complications: Secondary | ICD-10-CM | POA: Diagnosis not present

## 2018-01-28 DIAGNOSIS — R296 Repeated falls: Secondary | ICD-10-CM | POA: Diagnosis not present

## 2018-01-28 DIAGNOSIS — R443 Hallucinations, unspecified: Secondary | ICD-10-CM | POA: Diagnosis not present

## 2018-01-28 DIAGNOSIS — M6281 Muscle weakness (generalized): Secondary | ICD-10-CM | POA: Diagnosis not present

## 2018-01-28 DIAGNOSIS — M1990 Unspecified osteoarthritis, unspecified site: Secondary | ICD-10-CM | POA: Diagnosis not present

## 2018-01-28 DIAGNOSIS — H3530 Unspecified macular degeneration: Secondary | ICD-10-CM | POA: Diagnosis not present

## 2018-02-02 DIAGNOSIS — H3530 Unspecified macular degeneration: Secondary | ICD-10-CM | POA: Diagnosis not present

## 2018-02-02 DIAGNOSIS — R296 Repeated falls: Secondary | ICD-10-CM | POA: Diagnosis not present

## 2018-02-02 DIAGNOSIS — M6281 Muscle weakness (generalized): Secondary | ICD-10-CM | POA: Diagnosis not present

## 2018-02-02 DIAGNOSIS — M1990 Unspecified osteoarthritis, unspecified site: Secondary | ICD-10-CM | POA: Diagnosis not present

## 2018-02-02 DIAGNOSIS — E119 Type 2 diabetes mellitus without complications: Secondary | ICD-10-CM | POA: Diagnosis not present

## 2018-02-02 DIAGNOSIS — R443 Hallucinations, unspecified: Secondary | ICD-10-CM | POA: Diagnosis not present

## 2018-02-04 DIAGNOSIS — M1990 Unspecified osteoarthritis, unspecified site: Secondary | ICD-10-CM | POA: Diagnosis not present

## 2018-02-04 DIAGNOSIS — H3530 Unspecified macular degeneration: Secondary | ICD-10-CM | POA: Diagnosis not present

## 2018-02-04 DIAGNOSIS — M6281 Muscle weakness (generalized): Secondary | ICD-10-CM | POA: Diagnosis not present

## 2018-02-04 DIAGNOSIS — E119 Type 2 diabetes mellitus without complications: Secondary | ICD-10-CM | POA: Diagnosis not present

## 2018-02-04 DIAGNOSIS — R443 Hallucinations, unspecified: Secondary | ICD-10-CM | POA: Diagnosis not present

## 2018-02-04 DIAGNOSIS — R296 Repeated falls: Secondary | ICD-10-CM | POA: Diagnosis not present

## 2018-02-09 DIAGNOSIS — H3530 Unspecified macular degeneration: Secondary | ICD-10-CM | POA: Diagnosis not present

## 2018-02-09 DIAGNOSIS — R443 Hallucinations, unspecified: Secondary | ICD-10-CM | POA: Diagnosis not present

## 2018-02-09 DIAGNOSIS — R296 Repeated falls: Secondary | ICD-10-CM | POA: Diagnosis not present

## 2018-02-09 DIAGNOSIS — E119 Type 2 diabetes mellitus without complications: Secondary | ICD-10-CM | POA: Diagnosis not present

## 2018-02-09 DIAGNOSIS — M6281 Muscle weakness (generalized): Secondary | ICD-10-CM | POA: Diagnosis not present

## 2018-02-09 DIAGNOSIS — M1990 Unspecified osteoarthritis, unspecified site: Secondary | ICD-10-CM | POA: Diagnosis not present

## 2018-02-10 DIAGNOSIS — M1990 Unspecified osteoarthritis, unspecified site: Secondary | ICD-10-CM | POA: Diagnosis not present

## 2018-02-10 DIAGNOSIS — R296 Repeated falls: Secondary | ICD-10-CM | POA: Diagnosis not present

## 2018-02-10 DIAGNOSIS — E119 Type 2 diabetes mellitus without complications: Secondary | ICD-10-CM | POA: Diagnosis not present

## 2018-02-10 DIAGNOSIS — M6281 Muscle weakness (generalized): Secondary | ICD-10-CM | POA: Diagnosis not present

## 2018-02-11 ENCOUNTER — Ambulatory Visit
Admission: RE | Admit: 2018-02-11 | Discharge: 2018-02-11 | Disposition: A | Discharge status: Home / Self Care | Payer: Medicare Other | Source: Ambulatory Visit | Attending: Neurology | Admitting: Neurology

## 2018-02-11 DIAGNOSIS — R270 Ataxia, unspecified: Secondary | ICD-10-CM | POA: Diagnosis not present

## 2018-02-11 DIAGNOSIS — F0390 Unspecified dementia without behavioral disturbance: Secondary | ICD-10-CM | POA: Diagnosis not present

## 2018-02-11 DIAGNOSIS — F03A0 Unspecified dementia, mild, without behavioral disturbance, psychotic disturbance, mood disturbance, and anxiety: Secondary | ICD-10-CM

## 2018-02-11 DIAGNOSIS — G629 Polyneuropathy, unspecified: Secondary | ICD-10-CM

## 2018-02-12 ENCOUNTER — Telehealth: Payer: Self-pay

## 2018-02-12 DIAGNOSIS — R443 Hallucinations, unspecified: Secondary | ICD-10-CM | POA: Diagnosis not present

## 2018-02-12 DIAGNOSIS — H3530 Unspecified macular degeneration: Secondary | ICD-10-CM | POA: Diagnosis not present

## 2018-02-12 DIAGNOSIS — M6281 Muscle weakness (generalized): Secondary | ICD-10-CM | POA: Diagnosis not present

## 2018-02-12 DIAGNOSIS — M1990 Unspecified osteoarthritis, unspecified site: Secondary | ICD-10-CM | POA: Diagnosis not present

## 2018-02-12 DIAGNOSIS — R296 Repeated falls: Secondary | ICD-10-CM | POA: Diagnosis not present

## 2018-02-12 DIAGNOSIS — E119 Type 2 diabetes mellitus without complications: Secondary | ICD-10-CM | POA: Diagnosis not present

## 2018-02-12 NOTE — Telephone Encounter (Signed)
Spoke with pt relaying message below.   

## 2018-02-12 NOTE — Telephone Encounter (Signed)
-----   Message from Cameron Sprang, MD sent at 02/12/2018  9:48 AM EDT ----- Pls let patient/son know the head CT did not show any evidence of stroke, tumor, or bleed. It showed age-related changes. Thanks

## 2018-02-16 DIAGNOSIS — H3530 Unspecified macular degeneration: Secondary | ICD-10-CM | POA: Diagnosis not present

## 2018-02-16 DIAGNOSIS — M1990 Unspecified osteoarthritis, unspecified site: Secondary | ICD-10-CM | POA: Diagnosis not present

## 2018-02-16 DIAGNOSIS — M6281 Muscle weakness (generalized): Secondary | ICD-10-CM | POA: Diagnosis not present

## 2018-02-16 DIAGNOSIS — R443 Hallucinations, unspecified: Secondary | ICD-10-CM | POA: Diagnosis not present

## 2018-02-16 DIAGNOSIS — R296 Repeated falls: Secondary | ICD-10-CM | POA: Diagnosis not present

## 2018-02-16 DIAGNOSIS — E119 Type 2 diabetes mellitus without complications: Secondary | ICD-10-CM | POA: Diagnosis not present

## 2018-02-17 DIAGNOSIS — E1142 Type 2 diabetes mellitus with diabetic polyneuropathy: Secondary | ICD-10-CM | POA: Diagnosis not present

## 2018-02-17 DIAGNOSIS — B351 Tinea unguium: Secondary | ICD-10-CM | POA: Diagnosis not present

## 2018-02-24 ENCOUNTER — Telehealth: Payer: Self-pay | Admitting: Family Medicine

## 2018-02-24 NOTE — Telephone Encounter (Signed)
Spoke with Kasandra Knudsen and gave the verbal order to put therapy on hold until next week

## 2018-02-24 NOTE — Telephone Encounter (Signed)
Ok lets 

## 2018-02-24 NOTE — Telephone Encounter (Signed)
Physical therapy needing a verbal to put his therapy on hold due to patient being out of town. [phone]

## 2018-03-04 DIAGNOSIS — J329 Chronic sinusitis, unspecified: Secondary | ICD-10-CM | POA: Diagnosis not present

## 2018-03-04 DIAGNOSIS — E119 Type 2 diabetes mellitus without complications: Secondary | ICD-10-CM | POA: Diagnosis not present

## 2018-03-04 DIAGNOSIS — R0789 Other chest pain: Secondary | ICD-10-CM | POA: Diagnosis not present

## 2018-03-06 ENCOUNTER — Telehealth: Payer: Self-pay | Admitting: Family Medicine

## 2018-03-06 NOTE — Telephone Encounter (Signed)
So noted, Dr. Richardson Landry to see

## 2018-03-06 NOTE — Telephone Encounter (Signed)
Eduardo Lee said they received an order to put patient on hold because he was out of town.  He has not returned from being out of town so they will have to discharge him because his episode is ending.

## 2018-03-10 DIAGNOSIS — E1065 Type 1 diabetes mellitus with hyperglycemia: Secondary | ICD-10-CM | POA: Diagnosis not present

## 2018-03-10 DIAGNOSIS — E7800 Pure hypercholesterolemia, unspecified: Secondary | ICD-10-CM | POA: Diagnosis not present

## 2018-03-12 DIAGNOSIS — I129 Hypertensive chronic kidney disease with stage 1 through stage 4 chronic kidney disease, or unspecified chronic kidney disease: Secondary | ICD-10-CM | POA: Diagnosis not present

## 2018-03-12 DIAGNOSIS — E103593 Type 1 diabetes mellitus with proliferative diabetic retinopathy without macular edema, bilateral: Secondary | ICD-10-CM | POA: Diagnosis not present

## 2018-03-12 DIAGNOSIS — M810 Age-related osteoporosis without current pathological fracture: Secondary | ICD-10-CM | POA: Diagnosis not present

## 2018-03-12 DIAGNOSIS — E1122 Type 2 diabetes mellitus with diabetic chronic kidney disease: Secondary | ICD-10-CM | POA: Diagnosis not present

## 2018-03-12 DIAGNOSIS — N183 Chronic kidney disease, stage 3 (moderate): Secondary | ICD-10-CM | POA: Diagnosis not present

## 2018-03-12 DIAGNOSIS — E7800 Pure hypercholesterolemia, unspecified: Secondary | ICD-10-CM | POA: Diagnosis not present

## 2018-03-12 DIAGNOSIS — Z7901 Long term (current) use of anticoagulants: Secondary | ICD-10-CM | POA: Diagnosis not present

## 2018-03-12 DIAGNOSIS — E1042 Type 1 diabetes mellitus with diabetic polyneuropathy: Secondary | ICD-10-CM | POA: Diagnosis not present

## 2018-03-12 DIAGNOSIS — Z7982 Long term (current) use of aspirin: Secondary | ICD-10-CM | POA: Diagnosis not present

## 2018-03-12 DIAGNOSIS — E1065 Type 1 diabetes mellitus with hyperglycemia: Secondary | ICD-10-CM | POA: Diagnosis not present

## 2018-03-12 DIAGNOSIS — E1022 Type 1 diabetes mellitus with diabetic chronic kidney disease: Secondary | ICD-10-CM | POA: Diagnosis not present

## 2018-03-12 DIAGNOSIS — E559 Vitamin D deficiency, unspecified: Secondary | ICD-10-CM | POA: Diagnosis not present

## 2018-03-13 ENCOUNTER — Other Ambulatory Visit: Payer: Self-pay | Admitting: Family Medicine

## 2018-03-26 DIAGNOSIS — S0010XA Contusion of unspecified eyelid and periocular area, initial encounter: Secondary | ICD-10-CM | POA: Diagnosis not present

## 2018-03-26 DIAGNOSIS — S0181XA Laceration without foreign body of other part of head, initial encounter: Secondary | ICD-10-CM | POA: Diagnosis not present

## 2018-03-26 DIAGNOSIS — S0990XA Unspecified injury of head, initial encounter: Secondary | ICD-10-CM | POA: Diagnosis not present

## 2018-04-28 DIAGNOSIS — H401131 Primary open-angle glaucoma, bilateral, mild stage: Secondary | ICD-10-CM | POA: Diagnosis not present

## 2018-04-28 DIAGNOSIS — H353134 Nonexudative age-related macular degeneration, bilateral, advanced atrophic with subfoveal involvement: Secondary | ICD-10-CM | POA: Diagnosis not present

## 2018-04-28 DIAGNOSIS — H35372 Puckering of macula, left eye: Secondary | ICD-10-CM | POA: Diagnosis not present

## 2018-04-28 DIAGNOSIS — E113553 Type 2 diabetes mellitus with stable proliferative diabetic retinopathy, bilateral: Secondary | ICD-10-CM | POA: Diagnosis not present

## 2018-04-28 LAB — HM DIABETES EYE EXAM

## 2018-05-02 DIAGNOSIS — S20219A Contusion of unspecified front wall of thorax, initial encounter: Secondary | ICD-10-CM | POA: Diagnosis not present

## 2018-05-02 DIAGNOSIS — S0003XA Contusion of scalp, initial encounter: Secondary | ICD-10-CM | POA: Diagnosis not present

## 2018-05-02 DIAGNOSIS — E119 Type 2 diabetes mellitus without complications: Secondary | ICD-10-CM | POA: Diagnosis not present

## 2018-05-02 DIAGNOSIS — Z86718 Personal history of other venous thrombosis and embolism: Secondary | ICD-10-CM | POA: Diagnosis not present

## 2018-05-02 DIAGNOSIS — Z7901 Long term (current) use of anticoagulants: Secondary | ICD-10-CM | POA: Diagnosis not present

## 2018-05-02 DIAGNOSIS — N400 Enlarged prostate without lower urinary tract symptoms: Secondary | ICD-10-CM | POA: Diagnosis not present

## 2018-05-02 DIAGNOSIS — S40022A Contusion of left upper arm, initial encounter: Secondary | ICD-10-CM | POA: Diagnosis not present

## 2018-05-02 DIAGNOSIS — S40021A Contusion of right upper arm, initial encounter: Secondary | ICD-10-CM | POA: Diagnosis not present

## 2018-05-02 DIAGNOSIS — S20212A Contusion of left front wall of thorax, initial encounter: Secondary | ICD-10-CM | POA: Diagnosis not present

## 2018-05-02 DIAGNOSIS — S0083XA Contusion of other part of head, initial encounter: Secondary | ICD-10-CM | POA: Diagnosis not present

## 2018-05-05 ENCOUNTER — Encounter: Payer: Self-pay | Admitting: Family Medicine

## 2018-06-09 ENCOUNTER — Ambulatory Visit (INDEPENDENT_AMBULATORY_CARE_PROVIDER_SITE_OTHER): Payer: Medicare Other | Admitting: Family Medicine

## 2018-06-09 ENCOUNTER — Encounter: Payer: Self-pay | Admitting: Family Medicine

## 2018-06-09 VITALS — BP 110/64 | Ht 65.0 in | Wt 131.0 lb

## 2018-06-09 DIAGNOSIS — R270 Ataxia, unspecified: Secondary | ICD-10-CM | POA: Diagnosis not present

## 2018-06-09 DIAGNOSIS — R2681 Unsteadiness on feet: Secondary | ICD-10-CM | POA: Diagnosis not present

## 2018-06-09 DIAGNOSIS — B351 Tinea unguium: Secondary | ICD-10-CM | POA: Diagnosis not present

## 2018-06-09 DIAGNOSIS — E1042 Type 1 diabetes mellitus with diabetic polyneuropathy: Secondary | ICD-10-CM | POA: Diagnosis not present

## 2018-06-09 DIAGNOSIS — Z7901 Long term (current) use of anticoagulants: Secondary | ICD-10-CM | POA: Diagnosis not present

## 2018-06-09 DIAGNOSIS — F339 Major depressive disorder, recurrent, unspecified: Secondary | ICD-10-CM | POA: Diagnosis not present

## 2018-06-09 DIAGNOSIS — Z86718 Personal history of other venous thrombosis and embolism: Secondary | ICD-10-CM | POA: Diagnosis not present

## 2018-06-09 DIAGNOSIS — I10 Essential (primary) hypertension: Secondary | ICD-10-CM

## 2018-06-09 DIAGNOSIS — E1065 Type 1 diabetes mellitus with hyperglycemia: Secondary | ICD-10-CM | POA: Diagnosis not present

## 2018-06-09 DIAGNOSIS — E1142 Type 2 diabetes mellitus with diabetic polyneuropathy: Secondary | ICD-10-CM | POA: Diagnosis not present

## 2018-06-09 MED ORDER — ZETIA 10 MG PO TABS: ORAL_TABLET | ORAL | 0 refills | Status: DC

## 2018-06-09 MED ORDER — RIVAROXABAN 20 MG PO TABS: ORAL_TABLET | ORAL | 5 refills | Status: DC

## 2018-06-09 MED ORDER — TAMSULOSIN HCL 0.4 MG PO CAPS: 0.4000 mg | ORAL_CAPSULE | Freq: Every day | ORAL | 5 refills | Status: DC

## 2018-06-09 MED ORDER — CITALOPRAM HYDROBROMIDE 40 MG PO TABS
40.0000 mg | ORAL_TABLET | Freq: Every day | ORAL | 1 refills | Status: DC
Start: 2018-06-09 — End: 2019-03-10

## 2018-06-09 NOTE — Progress Notes (Addendum)
   Subjective:    Patient ID: Eduardo Lee, male    DOB: 09-25-1926, 84 y.o.   MRN: [id number] Patient arrives after 32-month hiatus HPI  Patient is here today to follow up on Htn and other chronic illnesses. He states he sees Dr. Elyse Lee for his Dm and is scheduled to see him on Thursday. He had labs drawn for him this am. He eats healthy, and gets some exercise. He sees Dr. Zadie Lee and sees a Podiatrist and the next appt with him is scheduled for today.  Has had real thin skin and tear s easy  Good appetite   exrcising reg    diab under good control, sees dr Eduardo Lee for this   Patient continues to be unsteady on his feet.  Has had a number of falls.  This is primarily due to his blindness along with his neuropathy.  6 months ago there is discussion of potential risk status but fortunately he is back with his girlfriend and primarily living in [place]  Patient continues to take lipid medication regularly. No obvious side effects from it. Generally does not miss a dose. Prior blood work results are reviewed with patient. Patient continues to work on fat intake in diet   Patient notes ongoing compliance with antidepressant medication. No obvious side effects. Reports does not miss a dose. Overall continues to help depression substantially. No thoughts of homicide or suicide. Would like to maintain medication.    Review of Systems No headache, no major weight loss or weight gain, no chest pain no back pain abdominal pain no change in bowel habits complete ROS otherwise negative     Objective:   Physical Exam  Alert and oriented, vitals reviewed and stable, NAD ENT-TM's and ext canals WNL bilat via otoscopic exam Soft palate, tonsils and post pharynx WNL via oropharyngeal exam Neck-symmetric, no masses; thyroid nonpalpable and nontender Pulmonary-no tachypnea or accessory muscle use; Clear without wheezes via auscultation Card--no abnrml murmurs, rhythm reg  and rate WNL Carotid pulses symmetric, without bruits Patient somewhat frail appearing clinically blind.  Ankles without edema.     Assessment & Plan:  1 impression somewhat dull status.  But cared for well by current girlfriend.  Living mostly in [place].  Encourage the patient to consider getting a family doctor closer to home rational discussed at length  2.  Hyperlipidemia prior blood work reviewed handling well maintain same dose  3.  Chronic anticoagulation.  Secondary to A. fib.  Patient maintains compliance with this no obvious side effects.  4.  Insulin requiring type 2 diabetes followed by specialist  Greater than 50% of this 25 minute face to face visit was spent in counseling and discussion and coordination of care regarding the above diagnosis/diagnosies  Recheck in 6 months, however I have encouraged patient to get a closer primary care clinician in [place]  Pt has nearly absent sensation in both feet to fine touch, pulses are present, but significant callusing alos present. Pt has severe neuropathy with callusing and is at high risk of damage to his feet and needs to wear diabetic shoes    WSLuking MD

## 2018-06-11 DIAGNOSIS — N183 Chronic kidney disease, stage 3 (moderate): Secondary | ICD-10-CM | POA: Diagnosis not present

## 2018-06-11 DIAGNOSIS — M810 Age-related osteoporosis without current pathological fracture: Secondary | ICD-10-CM | POA: Diagnosis not present

## 2018-06-11 DIAGNOSIS — E1022 Type 1 diabetes mellitus with diabetic chronic kidney disease: Secondary | ICD-10-CM | POA: Diagnosis not present

## 2018-06-11 DIAGNOSIS — E7800 Pure hypercholesterolemia, unspecified: Secondary | ICD-10-CM | POA: Diagnosis not present

## 2018-06-11 DIAGNOSIS — Z7982 Long term (current) use of aspirin: Secondary | ICD-10-CM | POA: Diagnosis not present

## 2018-06-11 DIAGNOSIS — E1065 Type 1 diabetes mellitus with hyperglycemia: Secondary | ICD-10-CM | POA: Diagnosis not present

## 2018-06-11 DIAGNOSIS — E559 Vitamin D deficiency, unspecified: Secondary | ICD-10-CM | POA: Diagnosis not present

## 2018-06-11 DIAGNOSIS — E1043 Type 1 diabetes mellitus with diabetic autonomic (poly)neuropathy: Secondary | ICD-10-CM | POA: Diagnosis not present

## 2018-06-11 DIAGNOSIS — Z86718 Personal history of other venous thrombosis and embolism: Secondary | ICD-10-CM | POA: Diagnosis not present

## 2018-06-11 DIAGNOSIS — I129 Hypertensive chronic kidney disease with stage 1 through stage 4 chronic kidney disease, or unspecified chronic kidney disease: Secondary | ICD-10-CM | POA: Diagnosis not present

## 2018-06-11 DIAGNOSIS — E1042 Type 1 diabetes mellitus with diabetic polyneuropathy: Secondary | ICD-10-CM | POA: Diagnosis not present

## 2018-06-11 DIAGNOSIS — E103593 Type 1 diabetes mellitus with proliferative diabetic retinopathy without macular edema, bilateral: Secondary | ICD-10-CM | POA: Diagnosis not present

## 2018-07-13 ENCOUNTER — Ambulatory Visit: Payer: Medicare Other | Admitting: Neurology

## 2018-08-10 DIAGNOSIS — E785 Hyperlipidemia, unspecified: Secondary | ICD-10-CM | POA: Diagnosis not present

## 2018-08-10 DIAGNOSIS — E113593 Type 2 diabetes mellitus with proliferative diabetic retinopathy without macular edema, bilateral: Secondary | ICD-10-CM | POA: Diagnosis not present

## 2018-08-10 DIAGNOSIS — Z794 Long term (current) use of insulin: Secondary | ICD-10-CM | POA: Diagnosis not present

## 2018-08-10 DIAGNOSIS — H409 Unspecified glaucoma: Secondary | ICD-10-CM | POA: Diagnosis not present

## 2018-08-10 DIAGNOSIS — K219 Gastro-esophageal reflux disease without esophagitis: Secondary | ICD-10-CM | POA: Diagnosis not present

## 2018-08-10 DIAGNOSIS — F330 Major depressive disorder, recurrent, mild: Secondary | ICD-10-CM | POA: Diagnosis not present

## 2018-08-10 DIAGNOSIS — R350 Frequency of micturition: Secondary | ICD-10-CM | POA: Diagnosis not present

## 2018-08-10 DIAGNOSIS — Z86718 Personal history of other venous thrombosis and embolism: Secondary | ICD-10-CM | POA: Diagnosis not present

## 2018-08-10 DIAGNOSIS — Z6823 Body mass index (BMI) 23.0-23.9, adult: Secondary | ICD-10-CM | POA: Diagnosis not present

## 2018-08-10 DIAGNOSIS — N401 Enlarged prostate with lower urinary tract symptoms: Secondary | ICD-10-CM | POA: Diagnosis not present

## 2018-08-11 DIAGNOSIS — E1142 Type 2 diabetes mellitus with diabetic polyneuropathy: Secondary | ICD-10-CM | POA: Diagnosis not present

## 2018-08-11 DIAGNOSIS — B351 Tinea unguium: Secondary | ICD-10-CM | POA: Diagnosis not present

## 2018-08-20 ENCOUNTER — Other Ambulatory Visit: Payer: Self-pay | Admitting: Family Medicine

## 2018-08-20 DIAGNOSIS — Z86718 Personal history of other venous thrombosis and embolism: Secondary | ICD-10-CM

## 2018-09-09 DIAGNOSIS — E1065 Type 1 diabetes mellitus with hyperglycemia: Secondary | ICD-10-CM | POA: Diagnosis not present

## 2018-09-09 DIAGNOSIS — E7800 Pure hypercholesterolemia, unspecified: Secondary | ICD-10-CM | POA: Diagnosis not present

## 2018-09-11 DIAGNOSIS — E7800 Pure hypercholesterolemia, unspecified: Secondary | ICD-10-CM | POA: Diagnosis not present

## 2018-09-11 DIAGNOSIS — Z23 Encounter for immunization: Secondary | ICD-10-CM | POA: Diagnosis not present

## 2018-09-11 DIAGNOSIS — E1042 Type 1 diabetes mellitus with diabetic polyneuropathy: Secondary | ICD-10-CM | POA: Diagnosis not present

## 2018-09-11 DIAGNOSIS — E1065 Type 1 diabetes mellitus with hyperglycemia: Secondary | ICD-10-CM | POA: Diagnosis not present

## 2018-09-11 DIAGNOSIS — M810 Age-related osteoporosis without current pathological fracture: Secondary | ICD-10-CM | POA: Diagnosis not present

## 2018-09-11 DIAGNOSIS — E559 Vitamin D deficiency, unspecified: Secondary | ICD-10-CM | POA: Diagnosis not present

## 2018-09-11 DIAGNOSIS — E103593 Type 1 diabetes mellitus with proliferative diabetic retinopathy without macular edema, bilateral: Secondary | ICD-10-CM | POA: Diagnosis not present

## 2018-09-11 DIAGNOSIS — E1022 Type 1 diabetes mellitus with diabetic chronic kidney disease: Secondary | ICD-10-CM | POA: Diagnosis not present

## 2018-09-11 DIAGNOSIS — N183 Chronic kidney disease, stage 3 (moderate): Secondary | ICD-10-CM | POA: Diagnosis not present

## 2018-09-11 DIAGNOSIS — I129 Hypertensive chronic kidney disease with stage 1 through stage 4 chronic kidney disease, or unspecified chronic kidney disease: Secondary | ICD-10-CM | POA: Diagnosis not present

## 2018-09-30 ENCOUNTER — Other Ambulatory Visit: Payer: Self-pay | Admitting: Family Medicine

## 2018-09-30 DIAGNOSIS — Z86718 Personal history of other venous thrombosis and embolism: Secondary | ICD-10-CM

## 2018-10-20 DIAGNOSIS — E1142 Type 2 diabetes mellitus with diabetic polyneuropathy: Secondary | ICD-10-CM | POA: Diagnosis not present

## 2018-10-20 DIAGNOSIS — B351 Tinea unguium: Secondary | ICD-10-CM | POA: Diagnosis not present

## 2018-10-22 ENCOUNTER — Telehealth: Payer: Self-pay | Admitting: Family Medicine

## 2018-10-22 NOTE — Telephone Encounter (Signed)
Please advise. Thank you

## 2018-10-22 NOTE — Telephone Encounter (Signed)
[place] has faxed over a request for records regarding pt. Form has been placed in your box for review so Danae Chen can fax records.

## 2018-11-09 DIAGNOSIS — Z6823 Body mass index (BMI) 23.0-23.9, adult: Secondary | ICD-10-CM | POA: Diagnosis not present

## 2018-11-09 DIAGNOSIS — F330 Major depressive disorder, recurrent, mild: Secondary | ICD-10-CM | POA: Diagnosis not present

## 2018-11-09 DIAGNOSIS — Z86718 Personal history of other venous thrombosis and embolism: Secondary | ICD-10-CM | POA: Diagnosis not present

## 2018-11-09 DIAGNOSIS — R350 Frequency of micturition: Secondary | ICD-10-CM | POA: Diagnosis not present

## 2018-11-09 DIAGNOSIS — N401 Enlarged prostate with lower urinary tract symptoms: Secondary | ICD-10-CM | POA: Diagnosis not present

## 2018-11-09 DIAGNOSIS — H409 Unspecified glaucoma: Secondary | ICD-10-CM | POA: Diagnosis not present

## 2018-11-09 DIAGNOSIS — Z794 Long term (current) use of insulin: Secondary | ICD-10-CM | POA: Diagnosis not present

## 2018-11-09 DIAGNOSIS — K219 Gastro-esophageal reflux disease without esophagitis: Secondary | ICD-10-CM | POA: Diagnosis not present

## 2018-11-09 DIAGNOSIS — E785 Hyperlipidemia, unspecified: Secondary | ICD-10-CM | POA: Diagnosis not present

## 2018-11-09 DIAGNOSIS — E113593 Type 2 diabetes mellitus with proliferative diabetic retinopathy without macular edema, bilateral: Secondary | ICD-10-CM | POA: Diagnosis not present

## 2018-11-29 IMAGING — CT CT HEAD W/O CM
3 series · 16 of 47 positions shown, 19 images · non-contrast
Comparison: 10/29/2011

CLINICAL DATA: Altered mental status with history of fall

EXAM:
CT HEAD WITHOUT CONTRAST
TECHNIQUE: Contiguous axial images were obtained from the base of the skull
through the vertex without intravenous contrast.

[Series 2: head trauma wo · axial · 0.45mm/px · z∈[+59,+184]mm · 10 of 31 slices shown, 13 images]
[im 3/31  brain]
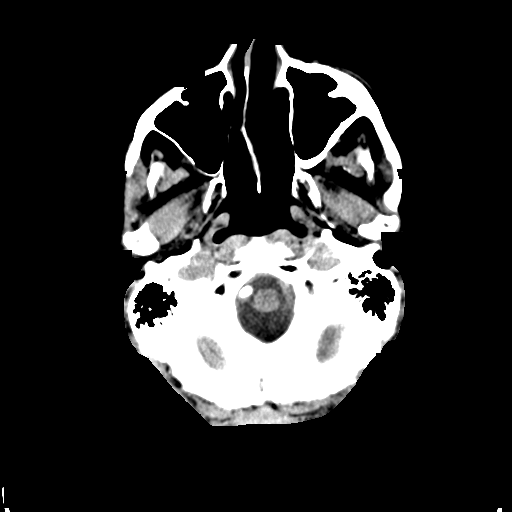
[im 3/31  bone]
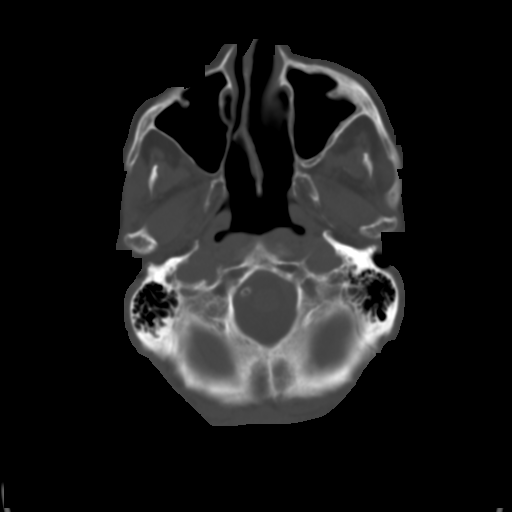
[im 6/31  brain]
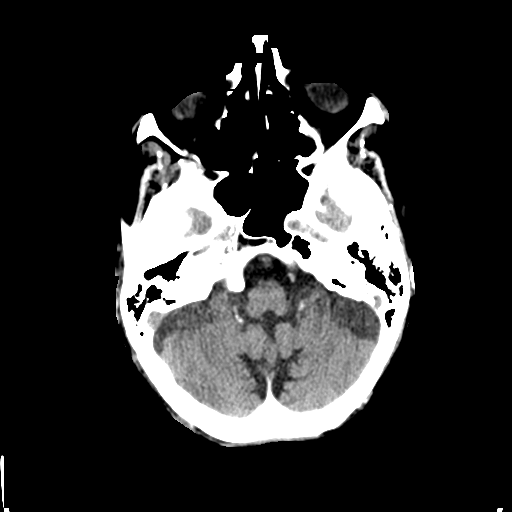
[im 9/31  brain]
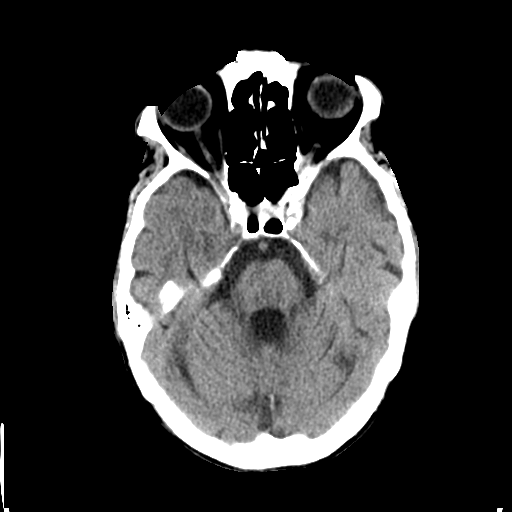
[im 11/31  brain]
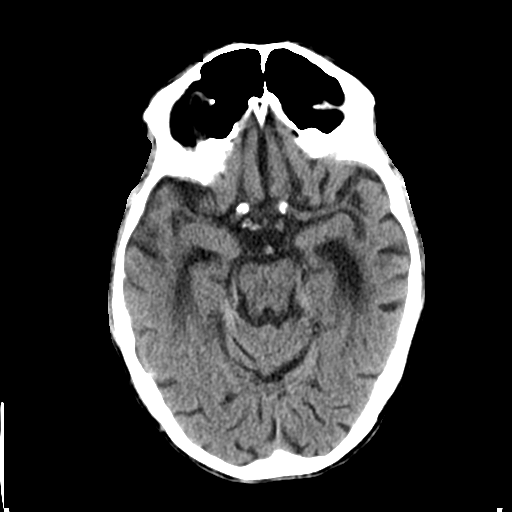
[im 14/31  brain]
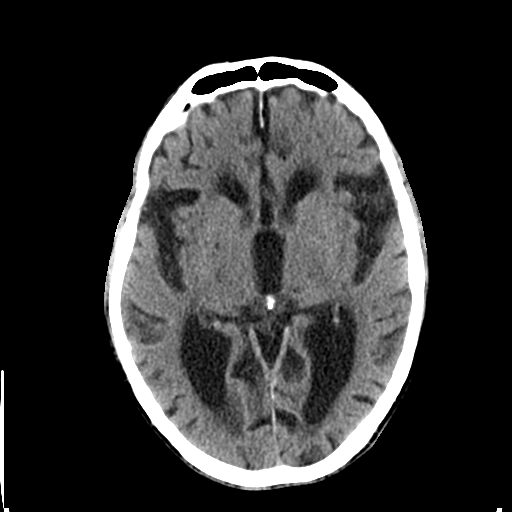
[im 14/31  bone]
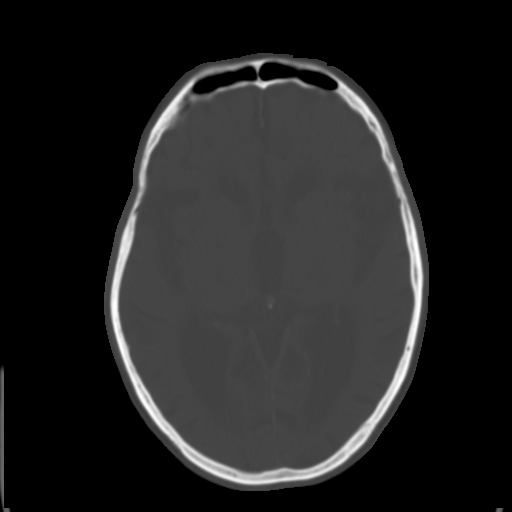
[im 17/31  brain]
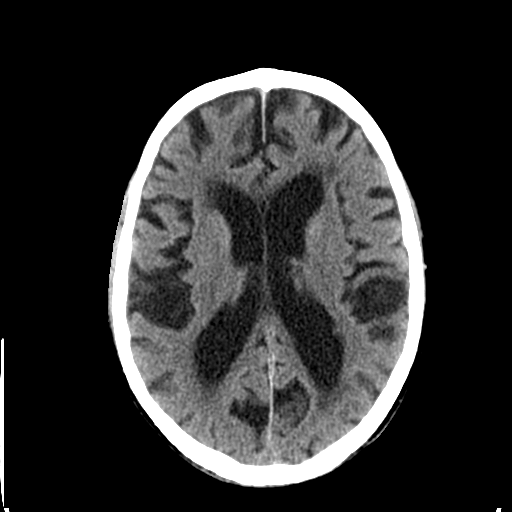
[im 20/31  brain]
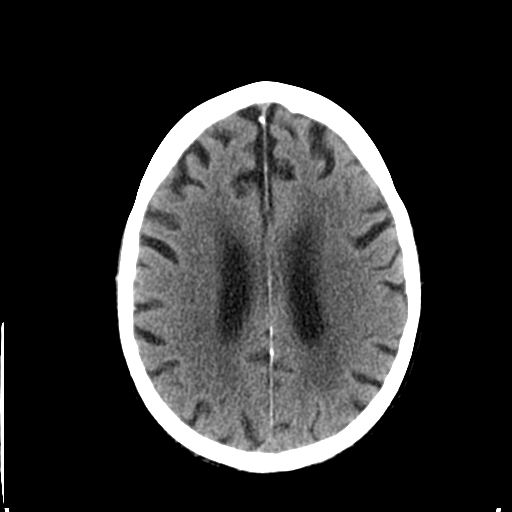
[im 23/31  brain]
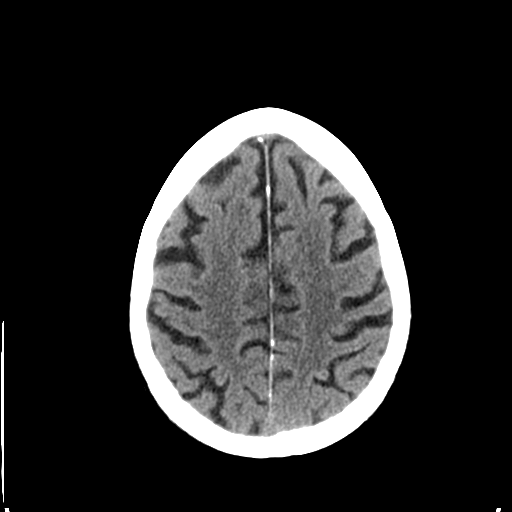
[im 25/31  brain]
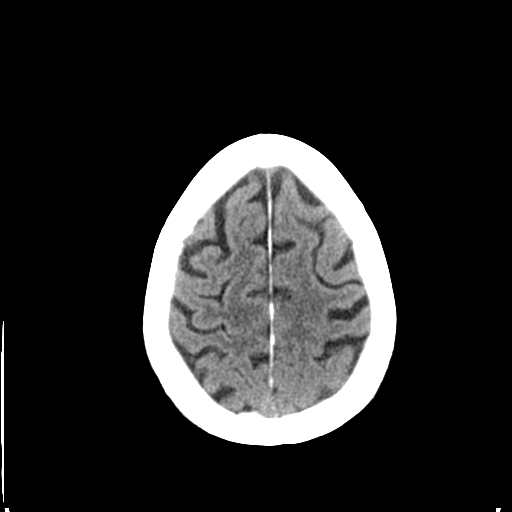
[im 25/31  bone]
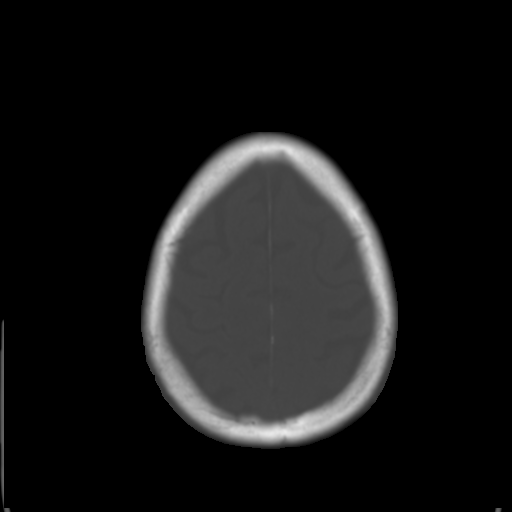
[im 28/31  brain]
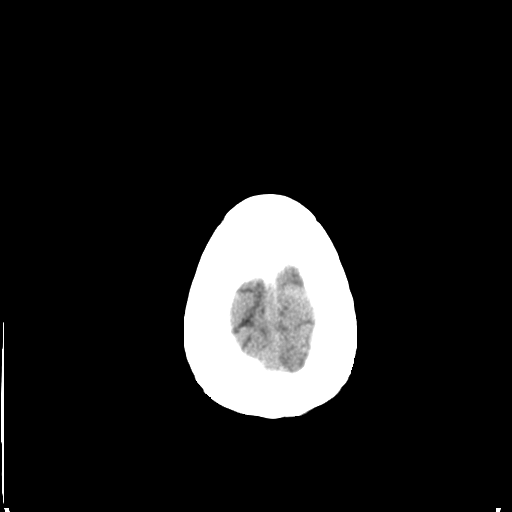

[Series 4: coronal soft tissue · coronal · 0.31mm/px · 3 of 77 slices shown]
[im 26/77  brain]
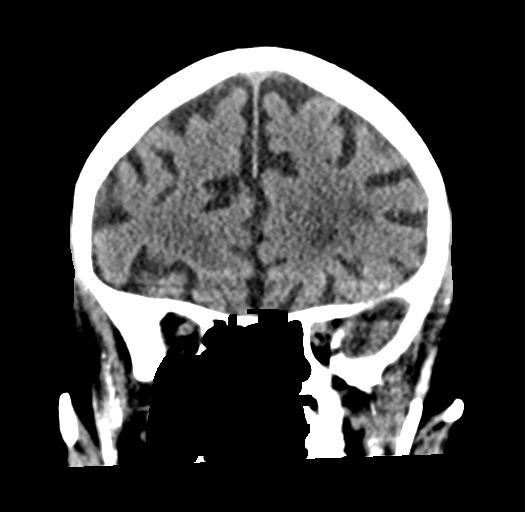
[im 34/77  brain]
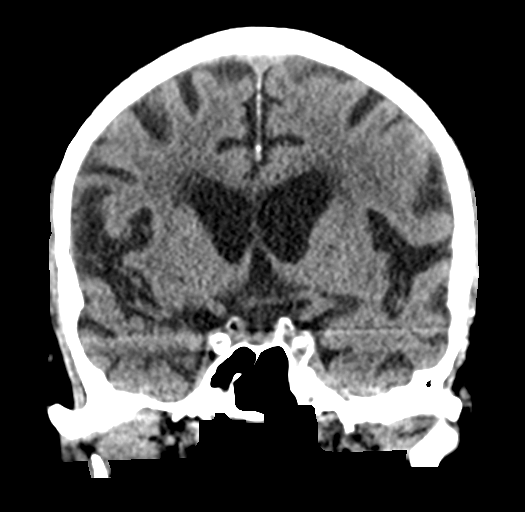
[im 43/77  brain]
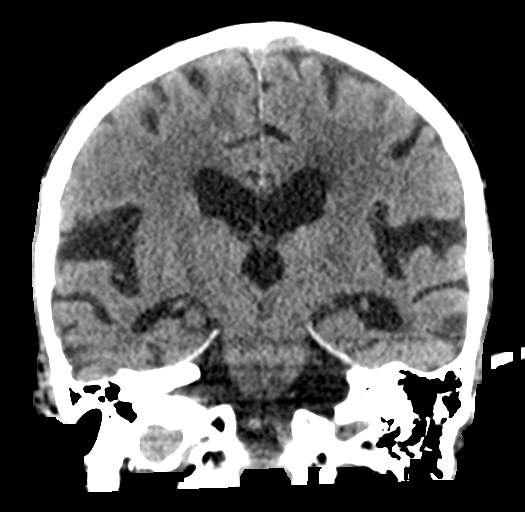

[Series 5: sagittal soft tissue · sagittal · 0.34mm/px · 3 of 54 slices shown]
[im 18/54  brain]
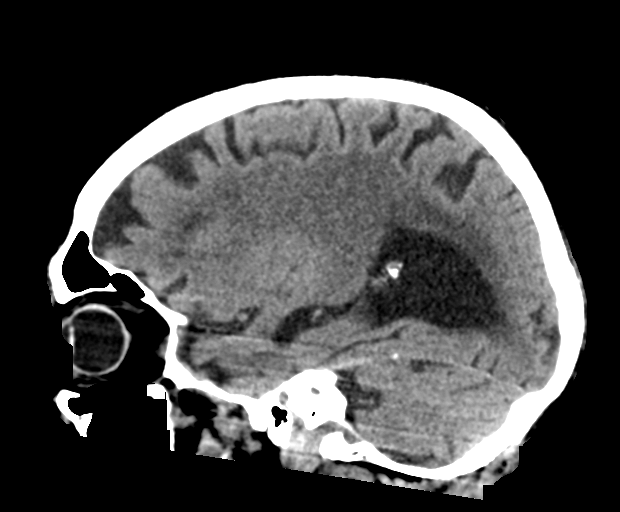
[im 27/54  brain]
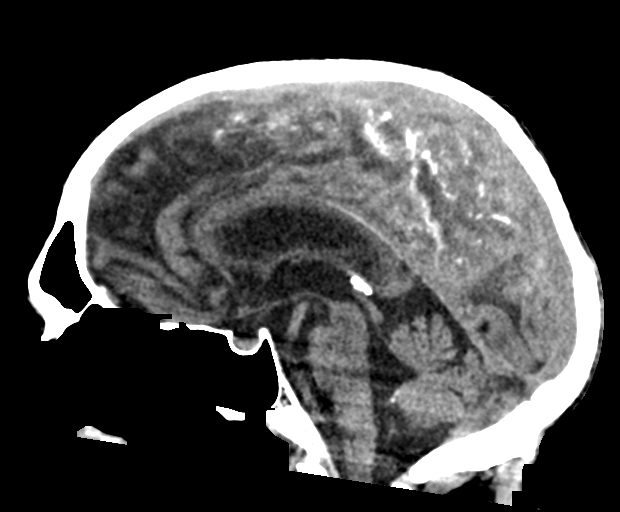
[im 36/54  brain]
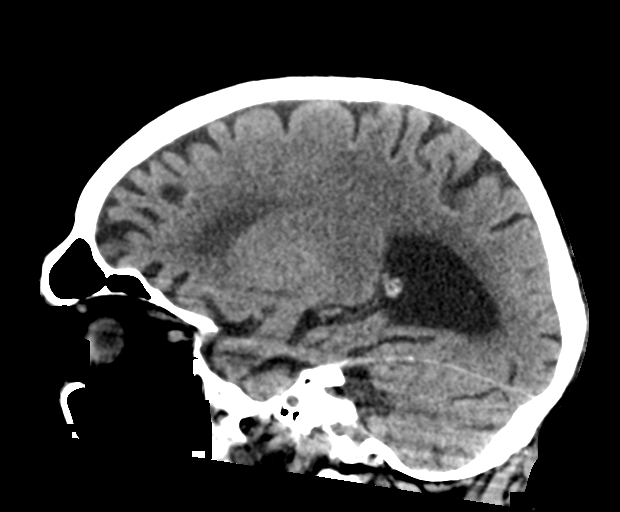

[16 of 47 positions shown; findings below may reference images not displayed]

FINDINGS: Brain: No acute territorial infarction, hemorrhage or mass is
visualized. Moderate diffuse atrophy. Mild to moderate
periventricular and subcortical white matter small vessel ischemic
changes. Old lacunar infarcts within the thalamus and right basal
ganglia. Enlarged ventricles are stable in size and felt secondary
to atrophy.

Vascular: No hyperdense vessels. Carotid artery calcifications and
vertebral artery calcifications.

Skull: No fracture.  No suspicious bone lesion.

Sinuses/Orbits: Mucosal thickening in the ethmoid and maxillary
sinuses. Bony remottling of the left maxillary sinus wall consistent
with chronic sinusitis. No acute orbital abnormality.

Other: None
IMPRESSION: 1. No CT evidence for acute intracranial abnormality
2. Atrophy with mild to moderate white matter small vessel ischemic
changes.

## 2018-12-22 DIAGNOSIS — E7800 Pure hypercholesterolemia, unspecified: Secondary | ICD-10-CM | POA: Diagnosis not present

## 2018-12-22 DIAGNOSIS — E1065 Type 1 diabetes mellitus with hyperglycemia: Secondary | ICD-10-CM | POA: Diagnosis not present

## 2018-12-23 DIAGNOSIS — H353134 Nonexudative age-related macular degeneration, bilateral, advanced atrophic with subfoveal involvement: Secondary | ICD-10-CM | POA: Diagnosis not present

## 2018-12-23 DIAGNOSIS — H35372 Puckering of macula, left eye: Secondary | ICD-10-CM | POA: Diagnosis not present

## 2018-12-23 DIAGNOSIS — H401131 Primary open-angle glaucoma, bilateral, mild stage: Secondary | ICD-10-CM | POA: Diagnosis not present

## 2018-12-23 DIAGNOSIS — E113553 Type 2 diabetes mellitus with stable proliferative diabetic retinopathy, bilateral: Secondary | ICD-10-CM | POA: Diagnosis not present

## 2018-12-24 DIAGNOSIS — M810 Age-related osteoporosis without current pathological fracture: Secondary | ICD-10-CM | POA: Diagnosis not present

## 2018-12-24 DIAGNOSIS — E1022 Type 1 diabetes mellitus with diabetic chronic kidney disease: Secondary | ICD-10-CM | POA: Diagnosis not present

## 2018-12-24 DIAGNOSIS — E7800 Pure hypercholesterolemia, unspecified: Secondary | ICD-10-CM | POA: Diagnosis not present

## 2018-12-24 DIAGNOSIS — N183 Chronic kidney disease, stage 3 (moderate): Secondary | ICD-10-CM | POA: Diagnosis not present

## 2018-12-24 DIAGNOSIS — I129 Hypertensive chronic kidney disease with stage 1 through stage 4 chronic kidney disease, or unspecified chronic kidney disease: Secondary | ICD-10-CM | POA: Diagnosis not present

## 2018-12-24 DIAGNOSIS — E103593 Type 1 diabetes mellitus with proliferative diabetic retinopathy without macular edema, bilateral: Secondary | ICD-10-CM | POA: Diagnosis not present

## 2018-12-24 DIAGNOSIS — E1042 Type 1 diabetes mellitus with diabetic polyneuropathy: Secondary | ICD-10-CM | POA: Diagnosis not present

## 2018-12-24 DIAGNOSIS — E559 Vitamin D deficiency, unspecified: Secondary | ICD-10-CM | POA: Diagnosis not present

## 2018-12-24 DIAGNOSIS — E1065 Type 1 diabetes mellitus with hyperglycemia: Secondary | ICD-10-CM | POA: Diagnosis not present

## 2019-01-15 DIAGNOSIS — B351 Tinea unguium: Secondary | ICD-10-CM | POA: Diagnosis not present

## 2019-01-15 DIAGNOSIS — E1142 Type 2 diabetes mellitus with diabetic polyneuropathy: Secondary | ICD-10-CM | POA: Diagnosis not present

## 2019-01-20 DIAGNOSIS — E113393 Type 2 diabetes mellitus with moderate nonproliferative diabetic retinopathy without macular edema, bilateral: Secondary | ICD-10-CM | POA: Diagnosis not present

## 2019-02-16 DIAGNOSIS — H47233 Glaucomatous optic atrophy, bilateral: Secondary | ICD-10-CM | POA: Diagnosis not present

## 2019-02-21 DIAGNOSIS — N400 Enlarged prostate without lower urinary tract symptoms: Secondary | ICD-10-CM | POA: Diagnosis not present

## 2019-02-21 DIAGNOSIS — H409 Unspecified glaucoma: Secondary | ICD-10-CM | POA: Diagnosis not present

## 2019-02-21 DIAGNOSIS — Z7901 Long term (current) use of anticoagulants: Secondary | ICD-10-CM | POA: Diagnosis not present

## 2019-02-21 DIAGNOSIS — R0602 Shortness of breath: Secondary | ICD-10-CM | POA: Diagnosis not present

## 2019-02-21 DIAGNOSIS — E785 Hyperlipidemia, unspecified: Secondary | ICD-10-CM | POA: Diagnosis not present

## 2019-02-21 DIAGNOSIS — R410 Disorientation, unspecified: Secondary | ICD-10-CM | POA: Diagnosis not present

## 2019-02-21 DIAGNOSIS — Z794 Long term (current) use of insulin: Secondary | ICD-10-CM | POA: Diagnosis not present

## 2019-02-21 DIAGNOSIS — F05 Delirium due to known physiological condition: Secondary | ICD-10-CM | POA: Diagnosis not present

## 2019-02-21 DIAGNOSIS — Z79899 Other long term (current) drug therapy: Secondary | ICD-10-CM | POA: Diagnosis not present

## 2019-02-21 DIAGNOSIS — R0902 Hypoxemia: Secondary | ICD-10-CM | POA: Diagnosis not present

## 2019-02-21 DIAGNOSIS — R4182 Altered mental status, unspecified: Secondary | ICD-10-CM | POA: Diagnosis not present

## 2019-02-21 DIAGNOSIS — E1110 Type 2 diabetes mellitus with ketoacidosis without coma: Secondary | ICD-10-CM | POA: Diagnosis not present

## 2019-02-21 DIAGNOSIS — Z7982 Long term (current) use of aspirin: Secondary | ICD-10-CM | POA: Diagnosis not present

## 2019-02-21 DIAGNOSIS — E1165 Type 2 diabetes mellitus with hyperglycemia: Secondary | ICD-10-CM | POA: Diagnosis not present

## 2019-02-21 DIAGNOSIS — K219 Gastro-esophageal reflux disease without esophagitis: Secondary | ICD-10-CM | POA: Diagnosis not present

## 2019-02-21 DIAGNOSIS — F329 Major depressive disorder, single episode, unspecified: Secondary | ICD-10-CM | POA: Diagnosis not present

## 2019-02-23 DIAGNOSIS — S9031XA Contusion of right foot, initial encounter: Secondary | ICD-10-CM | POA: Diagnosis not present

## 2019-02-23 DIAGNOSIS — S92414A Nondisplaced fracture of proximal phalanx of right great toe, initial encounter for closed fracture: Secondary | ICD-10-CM | POA: Diagnosis not present

## 2019-02-23 DIAGNOSIS — S92514A Nondisplaced fracture of proximal phalanx of right lesser toe(s), initial encounter for closed fracture: Secondary | ICD-10-CM | POA: Diagnosis not present

## 2019-03-02 ENCOUNTER — Other Ambulatory Visit: Payer: Self-pay

## 2019-03-02 ENCOUNTER — Ambulatory Visit (INDEPENDENT_AMBULATORY_CARE_PROVIDER_SITE_OTHER): Payer: Medicare Other | Admitting: Family Medicine

## 2019-03-02 DIAGNOSIS — R270 Ataxia, unspecified: Secondary | ICD-10-CM

## 2019-03-02 DIAGNOSIS — R29898 Other symptoms and signs involving the musculoskeletal system: Secondary | ICD-10-CM | POA: Diagnosis not present

## 2019-03-02 DIAGNOSIS — F339 Major depressive disorder, recurrent, unspecified: Secondary | ICD-10-CM | POA: Diagnosis not present

## 2019-03-02 MED ORDER — CLONAZEPAM 0.5 MG PO TABS: 0.5000 mg | ORAL_TABLET | Freq: Every day | ORAL | 1 refills | Status: DC | PRN

## 2019-03-02 NOTE — Progress Notes (Signed)
   Subjective:    Patient ID: Eduardo Lee, male    DOB: [birth year], 85 y.o.   MRN: [id number]  HPI  Patient returned home last Monday and was doing ok for last 2 days and then started with a lot of confusion and the last 3 days have been the worse.  Has been home for a week.  Patient has shuffled back-and-forth between various caretakers over the last couple years.  Has been with his girlfriend up until 1 week ago.  Family has brought him back up into the community.  He does have diabetes and is fasting sugars are generally between 2 and 300.  His oral intake is been somewhat diminished.  Was seen in the emergency room a week ago with negative work-up for urinary tract infection and unremarkable blood work per family    Review of Systems     Objective:   Physical Exam   Not examined due to virtual visit     Assessment & Plan:  Impression occasional agitation and worsening confusion in a patient with known dementia.  Complicated by multiple caretakers.  Complicated by family going back and forth whether to have patient go to a long-term care facility.  He realizes it is not a choice at this time.  Hydration discussed.  Klonopin as needed for agitation discussed.  Touch base with his endocrinologist to get his sugar below 200  Greater than 50% of this 25 minute face to face visit was spent in counseling and discussion and coordination of care regarding the above diagnosis/diagnosies

## 2019-03-03 ENCOUNTER — Emergency Department (HOSPITAL_COMMUNITY): Payer: Medicare Other

## 2019-03-03 ENCOUNTER — Other Ambulatory Visit: Payer: Self-pay

## 2019-03-03 ENCOUNTER — Inpatient Hospital Stay (HOSPITAL_COMMUNITY)
Admission: EM | Admit: 2019-03-03 | Discharge: 2019-03-09 | DRG: 082 | Disposition: A | Discharge status: Skilled Nursing Facility | Payer: Medicare Other | Attending: Internal Medicine | Admitting: Internal Medicine

## 2019-03-03 ENCOUNTER — Telehealth: Payer: Self-pay | Admitting: Family Medicine

## 2019-03-03 ENCOUNTER — Encounter (HOSPITAL_COMMUNITY): Payer: Self-pay | Admitting: Emergency Medicine

## 2019-03-03 DIAGNOSIS — R4182 Altered mental status, unspecified: Secondary | ICD-10-CM | POA: Diagnosis not present

## 2019-03-03 DIAGNOSIS — I120 Hypertensive chronic kidney disease with stage 5 chronic kidney disease or end stage renal disease: Secondary | ICD-10-CM | POA: Diagnosis not present

## 2019-03-03 DIAGNOSIS — R531 Weakness: Secondary | ICD-10-CM | POA: Diagnosis not present

## 2019-03-03 DIAGNOSIS — Z978 Presence of other specified devices: Secondary | ICD-10-CM | POA: Diagnosis not present

## 2019-03-03 DIAGNOSIS — Z8673 Personal history of transient ischemic attack (TIA), and cerebral infarction without residual deficits: Secondary | ICD-10-CM | POA: Diagnosis not present

## 2019-03-03 DIAGNOSIS — S06350A Traumatic hemorrhage of left cerebrum without loss of consciousness, initial encounter: Secondary | ICD-10-CM

## 2019-03-03 DIAGNOSIS — E1065 Type 1 diabetes mellitus with hyperglycemia: Secondary | ICD-10-CM | POA: Diagnosis present

## 2019-03-03 DIAGNOSIS — Z86718 Personal history of other venous thrombosis and embolism: Secondary | ICD-10-CM | POA: Diagnosis not present

## 2019-03-03 DIAGNOSIS — Z7901 Long term (current) use of anticoagulants: Secondary | ICD-10-CM | POA: Diagnosis not present

## 2019-03-03 DIAGNOSIS — Z86711 Personal history of pulmonary embolism: Secondary | ICD-10-CM | POA: Diagnosis not present

## 2019-03-03 DIAGNOSIS — R410 Disorientation, unspecified: Secondary | ICD-10-CM | POA: Diagnosis not present

## 2019-03-03 DIAGNOSIS — R918 Other nonspecific abnormal finding of lung field: Secondary | ICD-10-CM | POA: Diagnosis not present

## 2019-03-03 DIAGNOSIS — Z833 Family history of diabetes mellitus: Secondary | ICD-10-CM

## 2019-03-03 DIAGNOSIS — E1029 Type 1 diabetes mellitus with other diabetic kidney complication: Secondary | ICD-10-CM | POA: Diagnosis not present

## 2019-03-03 DIAGNOSIS — E1165 Type 2 diabetes mellitus with hyperglycemia: Secondary | ICD-10-CM | POA: Diagnosis not present

## 2019-03-03 DIAGNOSIS — S065X9A Traumatic subdural hemorrhage with loss of consciousness of unspecified duration, initial encounter: Principal | ICD-10-CM | POA: Diagnosis present

## 2019-03-03 DIAGNOSIS — R0689 Other abnormalities of breathing: Secondary | ICD-10-CM | POA: Diagnosis not present

## 2019-03-03 DIAGNOSIS — Z741 Need for assistance with personal care: Secondary | ICD-10-CM | POA: Diagnosis not present

## 2019-03-03 DIAGNOSIS — E1022 Type 1 diabetes mellitus with diabetic chronic kidney disease: Secondary | ICD-10-CM | POA: Diagnosis present

## 2019-03-03 DIAGNOSIS — Z7401 Bed confinement status: Secondary | ICD-10-CM | POA: Diagnosis not present

## 2019-03-03 DIAGNOSIS — Z7982 Long term (current) use of aspirin: Secondary | ICD-10-CM

## 2019-03-03 DIAGNOSIS — H548 Legal blindness, as defined in USA: Secondary | ICD-10-CM | POA: Diagnosis present

## 2019-03-03 DIAGNOSIS — D631 Anemia in chronic kidney disease: Secondary | ICD-10-CM | POA: Diagnosis present

## 2019-03-03 DIAGNOSIS — F329 Major depressive disorder, single episode, unspecified: Secondary | ICD-10-CM | POA: Diagnosis present

## 2019-03-03 DIAGNOSIS — K3184 Gastroparesis: Secondary | ICD-10-CM | POA: Diagnosis present

## 2019-03-03 DIAGNOSIS — M810 Age-related osteoporosis without current pathological fracture: Secondary | ICD-10-CM | POA: Diagnosis present

## 2019-03-03 DIAGNOSIS — J690 Pneumonitis due to inhalation of food and vomit: Secondary | ICD-10-CM | POA: Diagnosis not present

## 2019-03-03 DIAGNOSIS — J9811 Atelectasis: Secondary | ICD-10-CM | POA: Diagnosis not present

## 2019-03-03 DIAGNOSIS — R0902 Hypoxemia: Secondary | ICD-10-CM | POA: Diagnosis not present

## 2019-03-03 DIAGNOSIS — E10319 Type 1 diabetes mellitus with unspecified diabetic retinopathy without macular edema: Secondary | ICD-10-CM | POA: Diagnosis present

## 2019-03-03 DIAGNOSIS — I517 Cardiomegaly: Secondary | ICD-10-CM | POA: Diagnosis not present

## 2019-03-03 DIAGNOSIS — I129 Hypertensive chronic kidney disease with stage 1 through stage 4 chronic kidney disease, or unspecified chronic kidney disease: Secondary | ICD-10-CM | POA: Diagnosis present

## 2019-03-03 DIAGNOSIS — N183 Chronic kidney disease, stage 3 (moderate): Secondary | ICD-10-CM | POA: Diagnosis present

## 2019-03-03 DIAGNOSIS — X58XXXA Exposure to other specified factors, initial encounter: Secondary | ICD-10-CM | POA: Diagnosis not present

## 2019-03-03 DIAGNOSIS — Z87891 Personal history of nicotine dependence: Secondary | ICD-10-CM

## 2019-03-03 DIAGNOSIS — Z66 Do not resuscitate: Secondary | ICD-10-CM | POA: Diagnosis present

## 2019-03-03 DIAGNOSIS — F0390 Unspecified dementia without behavioral disturbance: Secondary | ICD-10-CM | POA: Diagnosis present

## 2019-03-03 DIAGNOSIS — R339 Retention of urine, unspecified: Secondary | ICD-10-CM | POA: Diagnosis not present

## 2019-03-03 DIAGNOSIS — F419 Anxiety disorder, unspecified: Secondary | ICD-10-CM | POA: Diagnosis present

## 2019-03-03 DIAGNOSIS — H9190 Unspecified hearing loss, unspecified ear: Secondary | ICD-10-CM | POA: Diagnosis present

## 2019-03-03 DIAGNOSIS — T17920S Food in respiratory tract, part unspecified causing asphyxiation, sequela: Secondary | ICD-10-CM

## 2019-03-03 DIAGNOSIS — G8929 Other chronic pain: Secondary | ICD-10-CM | POA: Diagnosis not present

## 2019-03-03 DIAGNOSIS — Z8619 Personal history of other infectious and parasitic diseases: Secondary | ICD-10-CM | POA: Diagnosis not present

## 2019-03-03 DIAGNOSIS — I959 Hypotension, unspecified: Secondary | ICD-10-CM | POA: Diagnosis not present

## 2019-03-03 DIAGNOSIS — R296 Repeated falls: Secondary | ICD-10-CM | POA: Diagnosis not present

## 2019-03-03 DIAGNOSIS — E109 Type 1 diabetes mellitus without complications: Secondary | ICD-10-CM | POA: Diagnosis not present

## 2019-03-03 DIAGNOSIS — R1312 Dysphagia, oropharyngeal phase: Secondary | ICD-10-CM | POA: Diagnosis not present

## 2019-03-03 DIAGNOSIS — Z79899 Other long term (current) drug therapy: Secondary | ICD-10-CM

## 2019-03-03 DIAGNOSIS — Z9181 History of falling: Secondary | ICD-10-CM | POA: Diagnosis not present

## 2019-03-03 DIAGNOSIS — I629 Nontraumatic intracranial hemorrhage, unspecified: Secondary | ICD-10-CM | POA: Diagnosis not present

## 2019-03-03 DIAGNOSIS — M6281 Muscle weakness (generalized): Secondary | ICD-10-CM | POA: Diagnosis not present

## 2019-03-03 DIAGNOSIS — E1143 Type 2 diabetes mellitus with diabetic autonomic (poly)neuropathy: Secondary | ICD-10-CM | POA: Diagnosis not present

## 2019-03-03 DIAGNOSIS — R404 Transient alteration of awareness: Secondary | ICD-10-CM | POA: Diagnosis not present

## 2019-03-03 DIAGNOSIS — I6200 Nontraumatic subdural hemorrhage, unspecified: Secondary | ICD-10-CM | POA: Diagnosis not present

## 2019-03-03 DIAGNOSIS — E1043 Type 1 diabetes mellitus with diabetic autonomic (poly)neuropathy: Secondary | ICD-10-CM | POA: Diagnosis present

## 2019-03-03 DIAGNOSIS — T17928S Food in respiratory tract, part unspecified causing other injury, sequela: Secondary | ICD-10-CM

## 2019-03-03 DIAGNOSIS — R279 Unspecified lack of coordination: Secondary | ICD-10-CM | POA: Diagnosis not present

## 2019-03-03 DIAGNOSIS — R29898 Other symptoms and signs involving the musculoskeletal system: Secondary | ICD-10-CM | POA: Diagnosis not present

## 2019-03-03 DIAGNOSIS — E876 Hypokalemia: Secondary | ICD-10-CM | POA: Diagnosis not present

## 2019-03-03 DIAGNOSIS — D6489 Other specified anemias: Secondary | ICD-10-CM | POA: Diagnosis not present

## 2019-03-03 DIAGNOSIS — Z794 Long term (current) use of insulin: Secondary | ICD-10-CM

## 2019-03-03 DIAGNOSIS — F411 Generalized anxiety disorder: Secondary | ICD-10-CM | POA: Diagnosis not present

## 2019-03-03 DIAGNOSIS — M2550 Pain in unspecified joint: Secondary | ICD-10-CM | POA: Diagnosis not present

## 2019-03-03 DIAGNOSIS — J698 Pneumonitis due to inhalation of other solids and liquids: Secondary | ICD-10-CM | POA: Diagnosis not present

## 2019-03-03 DIAGNOSIS — R262 Difficulty in walking, not elsewhere classified: Secondary | ICD-10-CM | POA: Diagnosis not present

## 2019-03-03 DIAGNOSIS — R58 Hemorrhage, not elsewhere classified: Secondary | ICD-10-CM | POA: Diagnosis not present

## 2019-03-03 DIAGNOSIS — IMO0001 Reserved for inherently not codable concepts without codable children: Secondary | ICD-10-CM

## 2019-03-03 HISTORY — DX: Ataxia, unspecified: R27.0

## 2019-03-03 HISTORY — DX: Repeated falls: R29.6

## 2019-03-03 HISTORY — DX: Unspecified dementia, mild, without behavioral disturbance, psychotic disturbance, mood disturbance, and anxiety: F03.A0

## 2019-03-03 HISTORY — DX: Unspecified dementia without behavioral disturbance: F03.90

## 2019-03-03 HISTORY — DX: Unspecified fall, initial encounter: W19.XXXA

## 2019-03-03 HISTORY — DX: Long term (current) use of anticoagulants: Z79.01

## 2019-03-03 LAB — COMPREHENSIVE METABOLIC PANEL
ALT: 13 U/L (ref 0–44)
AST: 15 U/L (ref 15–41)
Albumin: 3.5 g/dL (ref 3.5–5.0)
Alkaline Phosphatase: 120 U/L (ref 38–126)
Anion gap: 12 (ref 5–15)
BUN: 26 mg/dL — ABNORMAL HIGH (ref 8–23)
CO2: 24 mmol/L (ref 22–32)
Calcium: 8.6 mg/dL — ABNORMAL LOW (ref 8.9–10.3)
Chloride: 97 mmol/L — ABNORMAL LOW (ref 98–111)
Creatinine, Ser: 1.31 mg/dL — ABNORMAL HIGH (ref 0.61–1.24)
GFR calc Af Amer: 55 mL/min — ABNORMAL LOW (ref >60)
GFR calc non Af Amer: 47 mL/min — ABNORMAL LOW (ref >60)
Glucose, Bld: 307 mg/dL — ABNORMAL HIGH (ref 70–99)
Potassium: 3.7 mmol/L (ref 3.5–5.1)
Sodium: 133 mmol/L — ABNORMAL LOW (ref 135–145)
Total Bilirubin: 0.8 mg/dL (ref 0.3–1.2)
Total Protein: 6.5 g/dL (ref 6.5–8.1)

## 2019-03-03 LAB — CBC WITH DIFFERENTIAL/PLATELET
Abs Immature Granulocytes: 0.04 10*3/uL (ref 0.00–0.07)
Basophils Absolute: 0 10*3/uL (ref 0.0–0.1)
Basophils Relative: 0 %
Eosinophils Absolute: 0.1 10*3/uL (ref 0.0–0.5)
Eosinophils Relative: 0 %
HCT: 37.7 % — ABNORMAL LOW (ref 39.0–52.0)
Hemoglobin: 12.1 g/dL — ABNORMAL LOW (ref 13.0–17.0)
Immature Granulocytes: 0 %
Lymphocytes Relative: 2 %
Lymphs Abs: 0.4 10*3/uL — ABNORMAL LOW (ref 0.7–4.0)
MCH: 29.6 pg (ref 26.0–34.0)
MCHC: 32.1 g/dL (ref 30.0–36.0)
MCV: 92.2 fL (ref 80.0–100.0)
Monocytes Absolute: 1.7 10*3/uL — ABNORMAL HIGH (ref 0.1–1.0)
Monocytes Relative: 11 %
Neutro Abs: 13.6 10*3/uL — ABNORMAL HIGH (ref 1.7–7.7)
Neutrophils Relative %: 87 %
Platelets: 336 10*3/uL (ref 150–400)
RBC: 4.09 MIL/uL — ABNORMAL LOW (ref 4.22–5.81)
RDW: 13.7 % (ref 11.5–15.5)
WBC: 15.9 10*3/uL — ABNORMAL HIGH (ref 4.0–10.5)
nRBC: 0 % (ref 0.0–0.2)

## 2019-03-03 LAB — URINALYSIS, ROUTINE W REFLEX MICROSCOPIC
Glucose, UA: >=500 mg/dL — AB
Hgb urine dipstick: NEGATIVE
Ketones, ur: 15 mg/dL — AB
Leukocytes,Ua: NEGATIVE
Nitrite: NEGATIVE
Protein, ur: NEGATIVE mg/dL
Specific Gravity, Urine: 1.025 (ref 1.005–1.030)
pH: 5.5 (ref 5.0–8.0)

## 2019-03-03 LAB — CBG MONITORING, ED: Glucose-Capillary: 361 mg/dL — ABNORMAL HIGH (ref 70–99)

## 2019-03-03 LAB — URINALYSIS, MICROSCOPIC (REFLEX)
Bacteria, UA: NONE SEEN
RBC / HPF: NONE SEEN RBC/hpf (ref 0–5)

## 2019-03-03 LAB — LACTIC ACID, PLASMA
Lactic Acid, Venous: 2.2 mmol/L (ref 0.5–1.9)
Lactic Acid, Venous: 1.1 mmol/L (ref 0.5–1.9)

## 2019-03-03 LAB — TROPONIN I: Troponin I: <0.03 ng/mL (ref <0.03)

## 2019-03-03 MED ORDER — SODIUM CHLORIDE 0.9 % IV SOLN
INTRAVENOUS | Status: DC
  Administered 2019-03-03 – 2019-03-06 (×4): via INTRAVENOUS

## 2019-03-03 MED ORDER — PROTHROMBIN COMPLEX CONC HUMAN 500 UNITS IV KIT
3145.0000 [IU] | PACK | Status: AC
  Administered 2019-03-03: 22:00:00 3145 [IU] via INTRAVENOUS
  Filled 2019-03-03: qty 3145

## 2019-03-03 NOTE — Telephone Encounter (Signed)
Tell Eduardo Lee I personally do not feel he will qualify for h h nursing level support at home, medicare has strict rules and I do not think he qualifies. We will make the referral but be prepared for a "no" from them . lts do  As far as bathing personal care needs, etc, this is not provided thru h h consultation  That is paid for privately by families or sometimes rarely pt will have insur to partly cover  She can contact dept of social serivces or counil on aging for list of personal care aides avail for hire

## 2019-03-03 NOTE — ED Triage Notes (Signed)
EMS reports that patient has had hyperglycemia since Sunday. CBG currently 361. Patient  does respond but is legally blind. Patient alert at this time. EMS gave a 300 NS bolus.

## 2019-03-03 NOTE — ED Provider Notes (Signed)
[facility] EMERGENCY DEPARTMENT Provider Note   CSN: [id number] Arrival date & time: 03/03/19  1906    History   Chief Complaint Chief Complaint  Patient presents with  . Hyperglycemia    HPI Eduardo Lee is a 85 y.o. male.     The history is provided by the patient, the EMS personnel, a caregiver and a relative. The history is limited by the condition of the patient (AMS, hx dementia).  Hyperglycemia  Pt was seen at 1925. Per EMS and family report: Family states pt has been increasingly generally weak and confused for the past 3 days. Family states he usually can stand to pivot to transfer, but has been unable to even do that today. Pt also has had decreased PO intake and "high blood sugars." Pt himself does not know why he is here. Denies CP/SOB, no abd pain. No reported cough, vomiting/diarrhea, fevers, focal motor weakness.    Past Medical History:  Diagnosis Date  . Ataxia   . Chronic anticoagulation   . Diabetes mellitus   . DVT (deep venous thrombosis) (HCC)    recurrent  . Falls   . Gastroparesis   . Hypertension   . Legally blind    diabetic retinopathy  . Mild dementia ([place])   . Neuropathy   . Osteoporosis   . Pulmonary embolism ([place]) 12/2010   on blood thinners  . Stroke ([place])   . Weight loss     Patient Active Problem List   Diagnosis Date Noted  . Klebsiella sepsis ([place]) 03/01/2017  . Closed fracture of left proximal tibia 02/28/2017  . Fracture of left proximal fibula 02/28/2017  . Poorly controlled type 2 diabetes mellitus with autonomic neuropathy ([place]) 02/27/2017  . Acute metabolic encephalopathy 25/63/8937  . Sepsis due to undetermined organism ([place]) 02/27/2017  . CKD stage 3 due to type 2 diabetes mellitus ([place]) 02/27/2017  . Urinary tract infection associated with indwelling urethral catheter ([place])   . Sepsis ([place]) 02/26/2017  . Pressure injury of skin 02/26/2017  . Acute encephalopathy 02/26/2017  . Atrial fibrillation ([place])  02/26/2017  . History of pulmonary embolism./ DVT 02/19/2017  . Fracture, femur ([place]) 02/19/2017  . Type 1 diabetes mellitus with hyperglycemia ([place])   . Hypercholesterolemia 01/29/2017  . Diabetic peripheral neuropathy ([place]) 12/11/2014  . Chronic upper back pain 12/11/2014  . Osteoporosis, unspecified 06/23/2014  . Chronic anticoagulation 05/26/2013  . Acute DVT (deep venous thrombosis) ([place]) 05/09/2013  . Left leg DVT ([place]) 05/07/2013  . Hypertension   . Intertrochanteric fracture of right femur ([place]) 12/09/2011  . ARF (acute renal failure) ([place]) 10/29/2011  . Leukocytosis 10/29/2011  . Normocytic anemia 10/29/2011  . Diabetic gastroparesis ([place]) 10/29/2011  . Weight loss, abnormal 10/29/2011    Past Surgical History:  Procedure Laterality Date  . ANKLE SURGERY     Patient states that he had pins place in the left foot  . BACK SURGERY  2008  . CARPAL TUNNEL RELEASE  08/2011   Left hand  . COLONOSCOPY  01/2011  . ORIF HIP FRACTURE  10/31/2011   Procedure: OPEN REDUCTION INTERNAL FIXATION HIP;  Surgeon: Arther Abbott, MD;  Location: [facility];  Service: Orthopedics;  Laterality: Right;  with Gamma Nail  . TRANSURETHRAL RESECTION OF PROSTATE  1984  . UPPER GASTROINTESTINAL ENDOSCOPY  01/2011        Home Medications    Prior to Admission medications   Medication Sig Start Date End Date Taking? Authorizing Provider  aspirin 81  MG tablet Take 81 mg by mouth every morning.    Yes [provider]  citalopram (CELEXA) 40 MG tablet Take 1 tablet (40 mg total) by mouth daily. Patient taking differently: Take 20 mg by mouth every morning.  06/09/18  Yes Mikey Kirschner, MD  clonazePAM (KLONOPIN) 0.5 MG tablet Take 1 tablet (0.5 mg total) by mouth daily as needed (agitiation). 03/02/19  Yes Mikey Kirschner, MD  insulin lispro (HUMALOG KWIKPEN) 100 UNIT/ML KwikPen Inject 4-7 Units into the skin 3 (three) times daily. PER SLIDING SCALE 06/18/18  Yes [provider]   latanoprost (XALATAN) 0.005 % ophthalmic solution Place 1 drop into both eyes at bedtime.    Yes [provider]  naproxen sodium (ALEVE) 220 MG tablet Take 440 mg by mouth daily.   Yes [provider]  polyethylene glycol powder (GLYCOLAX/MIRALAX) powder Take 34 g by mouth every morning.   Yes [provider]  tamsulosin (FLOMAX) 0.4 MG CAPS capsule Take 1 capsule (0.4 mg total) by mouth daily. Patient taking differently: Take 0.4 mg by mouth every morning.  06/09/18  Yes Mikey Kirschner, MD  TRESIBA FLEXTOUCH 100 UNIT/ML SOPN FlexTouch Pen Inject 4-6 Units into the muscle at bedtime. For blood sugar levels over 300, give 6 units. Give 4-5 units for levels under 300 02/25/17  Yes [provider]  vitamin B-12 (CYANOCOBALAMIN) 1000 MCG tablet Take 1,000 mcg by mouth every morning.    Yes [provider]  Vitamin D, Ergocalciferol, (DRISDOL) 50000 UNITS CAPS Take 50,000 Units by mouth every Wednesday.    Yes [provider]  XARELTO 20 MG TABS tablet TAKE 1 TABLET BY MOUTH DAILY WITH SUPPER Patient taking differently: Take 20 mg by mouth every morning.  09/30/18  Yes Mikey Kirschner, MD  ZETIA 10 MG tablet TAKE 1 TABLET(10 MG) BY MOUTH AT BEDTIME Patient taking differently: Take 10 mg by mouth every morning.  06/09/18  Yes Mikey Kirschner, MD    Family History Family History  Problem Relation Age of Onset  . Diabetes Mother   . Diabetes Father   . Diabetes Sister   . Diabetes Brother   . Diabetes Sister   . Diabetes Son   . Healthy Son   . Healthy Son     Social History Social History   Tobacco Use  . Smoking status: Former Smoker    Years: 20.00    Types: Cigars    Last attempt to quit: 10/27/1981    Years since quitting: 37.3  . Smokeless tobacco: Never Used  Substance Use Topics  . Alcohol use: No    Alcohol/week: 0.0 standard drinks    Comment: Drinks Scotch 3 times per week prior to [employer]- hasn't drank x 1 month  .  Drug use: No     Allergies   Patient has no known allergies.   Review of Systems Review of Systems  Unable to perform ROS: Mental status change     Physical Exam Updated Vital Signs BP 116/60   Pulse 99   Temp 99.5 F (37.5 C) (Rectal)   Resp 19   Ht 5\' 5"  (1.651 m)   Wt 63.5 kg   SpO2 96%   BMI 23.30 kg/m     Patient Vitals for the past 24 hrs:  BP Temp Temp src Pulse Resp SpO2 Height Weight  03/03/19 2230 (!) 126/55 - - 77 17 98 % - -  03/03/19 2200 (!) 150/66 - - 78 19  98 % - -  03/03/19 2000 116/60 - - 99 19 96 % - -  03/03/19 1930 113/62 - - 97 13 97 % - -  03/03/19 1924 (!) 148/68 99.5 F (37.5 C) Rectal 94 (!) 24 94 % - -  03/03/19 1918 (!) 148/68 99.5 F (37.5 C) Rectal 90 20 94 % - -  03/03/19 1915 - - - - - - 5\' 5"  (1.651 m) 63.5 kg     Physical Exam 1930: Physical examination:  Nursing notes reviewed; Vital signs and O2 SAT reviewed;  Constitutional: Well developed, Well nourished, In no acute distress; Head:  Normocephalic, atraumatic; Eyes: EOMI, PERRL, No scleral icterus; ENMT: Mouth and pharynx normal, Mucous membranes dry; Neck: Supple, Full range of motion; Cardiovascular: Regular rate and rhythm, No gallop; Respiratory: Breath sounds clear & equal bilaterally, No wheezes. Speaking full sentences with ease, Normal respiratory effort/excursion; Chest: Nontender, Movement normal; Abdomen: Soft, Nontender, Nondistended, Normal bowel sounds; Genitourinary: No CVA tenderness; Spine:  No midline CS, TS, LS tenderness.;; Extremities: Peripheral pulses normal, No tenderness, No edema, No calf edema or asymmetry.; Neuro: Awake, alert, intermittently mildly confused.  +blind per hx. No facial droop. Speech clear. Grips equal. Strength 5/5 equal bilat UE's and LE's. Moves all extremities spontaneously and to command without apparent gross focal motor deficits.; Skin: Color normal, Warm, Dry.    ED Treatments / Results  Labs (all labs ordered are listed, but  only abnormal results are displayed)   EKG EKG Interpretation  Date/Time:  Wednesday March 03 2019 19:47:21 EDT Ventricular Rate:  85 PR Interval:    QRS Duration: 97 QT Interval:  384 QTC Calculation: 457 R Axis:   75 Text Interpretation:  Sinus rhythm Baseline wander Artifact When compared with ECG of 02/27/2017 Rate slower Otherwise no significant change Confirmed by Francine Graven [id number]) on 03/03/2019 7:56:09 PM   Radiology   Procedures Procedures (including critical care time)  Medications Ordered in ED Medications  0.9 %  sodium chloride infusion ( Intravenous Restarted 03/03/19 2150)  prothrombin complex conc human (KCENTRA) IVPB 3,145 Units (3,145 Units Intravenous New Bag/Given 03/03/19 2149)     Initial Impression / Assessment and Plan / ED Course  I have reviewed the triage vital signs and the nursing notes.  Pertinent labs & imaging results that were available during my care of the patient were reviewed by me and considered in my medical decision making (see chart for details).     MDM Reviewed: previous chart, nursing note and vitals Reviewed previous: labs and ECG Interpretation: labs, ECG, x-ray and CT scan Total time providing critical care: 30-74 minutes. This excludes time spent performing separately reportable procedures and services. Consults: neurosurgery and admitting MD   CRITICAL CARE Performed by: Francine Graven Total critical care time: 35 minutes Critical care time was exclusive of separately billable procedures and treating other patients. Critical care was necessary to treat or prevent imminent or life-threatening deterioration. Critical care was time spent personally by me on the following activities: development of treatment plan with patient and/or surrogate as well as nursing, discussions with consultants, evaluation of patient's response to treatment, examination of patient, obtaining history from patient or surrogate, ordering and  performing treatments and interventions, ordering and review of laboratory studies, ordering and review of radiographic studies, pulse oximetry and re-evaluation of patient's condition.   Results for orders placed or performed during the hospital encounter of 03/03/19  Urinalysis, Routine w reflex microscopic  Result Value Ref Range   Color,  Urine YELLOW YELLOW   APPearance CLEAR CLEAR   Specific Gravity, Urine 1.025 1.005 - 1.030   pH 5.5 5.0 - 8.0   Glucose, UA >=500 (A) NEGATIVE mg/dL   Hgb urine dipstick NEGATIVE NEGATIVE   Bilirubin Urine SMALL (A) NEGATIVE   Ketones, ur 15 (A) NEGATIVE mg/dL   Protein, ur NEGATIVE NEGATIVE mg/dL   Nitrite NEGATIVE NEGATIVE   Leukocytes,Ua NEGATIVE NEGATIVE  Comprehensive metabolic panel  Result Value Ref Range   Sodium 133 (L) 135 - 145 mmol/L   Potassium 3.7 3.5 - 5.1 mmol/L   Chloride 97 (L) 98 - 111 mmol/L   CO2 24 22 - 32 mmol/L   Glucose, Bld 307 (H) 70 - 99 mg/dL   BUN 26 (H) 8 - 23 mg/dL   Creatinine, Ser 1.31 (H) 0.61 - 1.24 mg/dL   Calcium 8.6 (L) 8.9 - 10.3 mg/dL   Total Protein 6.5 6.5 - 8.1 g/dL   Albumin 3.5 3.5 - 5.0 g/dL   AST 15 15 - 41 U/L   ALT 13 0 - 44 U/L   Alkaline Phosphatase 120 38 - 126 U/L   Total Bilirubin 0.8 0.3 - 1.2 mg/dL   GFR calc non Af Amer 47 (L) >60 mL/min   GFR calc Af Amer 55 (L) >60 mL/min   Anion gap 12 5 - 15  Troponin I - Once  Result Value Ref Range   Troponin I <0.03 <0.03 ng/mL  Lactic acid, plasma  Result Value Ref Range   Lactic Acid, Venous 2.2 (HH) 0.5 - 1.9 mmol/L  Lactic acid, plasma  Result Value Ref Range   Lactic Acid, Venous 1.1 0.5 - 1.9 mmol/L  CBC with Differential  Result Value Ref Range   WBC 15.9 (H) 4.0 - 10.5 K/uL   RBC 4.09 (L) 4.22 - 5.81 MIL/uL   Hemoglobin 12.1 (L) 13.0 - 17.0 g/dL   HCT 37.7 (L) 39.0 - 52.0 %   MCV 92.2 80.0 - 100.0 fL   MCH 29.6 26.0 - 34.0 pg   MCHC 32.1 30.0 - 36.0 g/dL   RDW 13.7 11.5 - 15.5 %   Platelets 336 150 - 400 K/uL   nRBC  0.0 0.0 - 0.2 %   Neutrophils Relative % 87 %   Neutro Abs 13.6 (H) 1.7 - 7.7 K/uL   Lymphocytes Relative 2 %   Lymphs Abs 0.4 (L) 0.7 - 4.0 K/uL   Monocytes Relative 11 %   Monocytes Absolute 1.7 (H) 0.1 - 1.0 K/uL   Eosinophils Relative 0 %   Eosinophils Absolute 0.1 0.0 - 0.5 K/uL   Basophils Relative 0 %   Basophils Absolute 0.0 0.0 - 0.1 K/uL   Immature Granulocytes 0 %   Abs Immature Granulocytes 0.04 0.00 - 0.07 K/uL  Urinalysis, Microscopic (reflex)  Result Value Ref Range   RBC / HPF NONE SEEN 0 - 5 RBC/hpf   WBC, UA 0-5 0 - 5 WBC/hpf   Bacteria, UA NONE SEEN NONE SEEN   Squamous Epithelial / LPF 0-5 0 - 5   Mucus PRESENT   CBG monitoring, ED  Result Value Ref Range   Glucose-Capillary 361 (H) 70 - 99 mg/dL   Dg Chest 1 View Result Date: 03/03/2019 CLINICAL DATA:  Altered mental status. Hyperglycemia. EXAM: CHEST  1 VIEW COMPARISON:  02/26/2017. FINDINGS: Cardiomegaly. Low lung volumes. Bibasilar subsegmental atelectasis. No consolidation or edema. Old rib fractures. No pneumothorax. Osteopenia. IMPRESSION: 1. Cardiomegaly with low lung volumes. Bibasilar subsegmental atelectasis. 2.  No consolidation or edema. Electronically Signed   By: Staci Righter M.D.   On: 03/03/2019 20:10   Ct Head Wo Contrast Result Date: 03/03/2019 CLINICAL DATA:  85 year old male with confusion. EXAM: CT HEAD WITHOUT CONTRAST TECHNIQUE: Contiguous axial images were obtained from the base of the skull through the vertex without intravenous contrast. COMPARISON:  Head CT dated 02/11/2018 FINDINGS: Brain: There is mild to moderate age-related atrophy and chronic microvascular ischemic changes. There is a 3.6 x 1.1 cm limited form high attenuating extra-axial collection along the left temporal lobe which may represent a subdural or epidural hemorrhage. There is minimal associated mass effect on the left temporal lobe. No midline shift. Vascular: No hyperdense vessel or unexpected calcification. Skull: Normal.  Negative for fracture or focal lesion. Sinuses/Orbits: There is near complete opacification of the left maxillary sinus with diffuse mucoperiosteal thickening of paranasal sinuses. The mastoid air cells are clear. Other: None IMPRESSION: 1. High attenuating extra-axial collection along the left temporal lobe which may represent a subdural or epidural hemorrhage. Close follow-up with CT recommended. 2. Age-related atrophy and chronic microvascular ischemic changes. 3. Paranasal sinus disease. These results were called by telephone at the time of interpretation on 03/03/2019 at 8:11 pm to Dr. Francine Graven , who verbally acknowledged these results. Electronically Signed   By: Anner Crete M.D.   On: 03/03/2019 20:13   Results for STEED, Eduardo "PETE" (MRN [id number]) as of 03/03/2019 23:02  Ref. Range 05/20/2017 07:17 05/30/2017 14:30 06/02/2017 03:55 03/03/2019 20:46  BUN Latest Ref Range: 8 - 23 mg/dL 33 (H) 49 (H) 33 (H) 26 (H)  Creatinine Latest Ref Range: 0.61 - 1.24 mg/dL 0.98 1.34 (H) 0.94 1.31 (H)    2040:  CT as above. T/C returned from [facility] Dr. Arnoldo Morale, case discussed, including:  HPI, pertinent PM/SHx, VS/PE, dx testing, ED course and treatment:  Pt does not need emergent surgery, speak with family regarding their wishes if pt deteriorates (and then may need surgery), reverse Xarelto with Kcentra, pt will need admission to [facility] ([facility] vs [facility]).  2050:  Agricultural consultant and I both spoke with pt's son Eduardo Lee. Eduardo Lee via phone: states family would not want pt to go through surgical intervention and pt is a DNR per his own advance directives.   2230:  Labs with mild BUN/Cr elevation from baseline; judicious IVF started. Pt remains awake/alert, talking with ED staff, moving all extremities spontaneously.  T/C returned from [facility] Dr. Darrick Meigs, case discussed, including:  HPI, pertinent PM/SHx, VS/PE, dx testing, ED course and treatment:  He will call family to clarify goals of care and then  will come to ED to evaluate pt for admit.   2300:  Per [facility] Dr. Darrick Meigs: Family now wishing pt to remain DNR but are now requesting if pt needs to have surgery they are agreeable with pt to have surgery. Dr. Darrick Meigs in ED for pt eval.      Final Clinical Impressions(s) / ED Diagnoses   Final diagnoses:  None    ED Discharge Orders    None       Francine Graven, DO 03/06/19 1550

## 2019-03-03 NOTE — Telephone Encounter (Signed)
k

## 2019-03-03 NOTE — H&P (Signed)
TRH H&P    Patient Demographics:    Eduardo Lee, is a 85 y.o. male  MRN: [id number]  DOB - 1926-09-11  Admit Date - 03/03/2019  Referring MD/NP/PA: Dr Thurnell Garbe  Outpatient Primary MD for the patient is Mikey Kirschner, MD  Patient coming from: Home  Chief complaint- Generalized weakness   HPI:    Eduardo Lee  is a 85 y.o. 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  As per ED notes family stated that patient usually could stand to pivot to transfer but was unable to do that today.  Also had high blood sugar. No other history is obtainable, patient is a poor historian due to underlying dementia and confusion. In the ED, CT head showed subdural or epidural hemorrhage along the left temporal lobe.  Neurosurgery Dr. Arnoldo Morale was consulted by Dr. Thurnell Garbe, Who recommended [facility] to reverse the effects of Xarelto.  Also if family not keen for surgery patient could stay at [facility] hospital.    Review of systems:    In addition to the HPI above,    All other systems reviewed and are negative.    Past History of the following :    Past Medical History:  Diagnosis Date  . Ataxia   . Chronic anticoagulation   . Diabetes mellitus   . DVT (deep venous thrombosis) (HCC)    recurrent  . Falls   . Gastroparesis   . Hypertension   . Legally blind    diabetic retinopathy  . Mild dementia ([place])   . Neuropathy   . Osteoporosis   . Pulmonary embolism ([place]) 12/2010   on blood thinners  . Stroke ([place])   . Weight loss       Past Surgical History:  Procedure Laterality Date  . ANKLE SURGERY     Patient states that he had pins place in the left foot  . BACK SURGERY  2008  . CARPAL TUNNEL RELEASE  08/2011   Left hand  . COLONOSCOPY  01/2011  . ORIF  HIP FRACTURE  10/31/2011   Procedure: OPEN REDUCTION INTERNAL FIXATION HIP;  Surgeon: Arther Abbott, MD;  Location: [facility];  Service: Orthopedics;  Laterality: Right;  with Gamma Nail  . TRANSURETHRAL RESECTION OF PROSTATE  1984  . UPPER GASTROINTESTINAL ENDOSCOPY  01/2011      Social History:      Social History   Tobacco Use  . Smoking status: Former Smoker    Years: 20.00    Types: Cigars    Last attempt to quit: 10/27/1981    Years since quitting: 37.3  . Smokeless tobacco: Never Used  Substance Use Topics  . Alcohol use: No    Alcohol/week: 0.0 standard drinks    Comment: Drinks Scotch 3 times per week prior to [employer]- hasn't drank x 1 month       Family History :     Family History  Problem Relation Age of Onset  . Diabetes Mother   . Diabetes Father   . Diabetes Sister   . Diabetes Brother   . Diabetes Sister   . Diabetes Son   . Healthy Son   . Healthy Son       Home Medications:   Prior to Admission medications   Medication Sig Start Date End Date Taking? Authorizing Provider  aspirin 81 MG tablet Take 81 mg by mouth every morning.    Yes [provider]  citalopram (CELEXA) 40 MG tablet Take 1 tablet (40 mg total) by mouth daily. Patient taking differently: Take 20 mg by mouth every morning.  06/09/18  Yes Mikey Kirschner, MD  clonazePAM (KLONOPIN) 0.5 MG tablet Take 1 tablet (0.5 mg total) by mouth daily as needed (agitiation). 03/02/19  Yes Mikey Kirschner, MD  insulin lispro (HUMALOG KWIKPEN) 100 UNIT/ML KwikPen Inject 4-7 Units into the skin 3 (three) times daily. PER SLIDING SCALE 06/18/18  Yes [provider]  latanoprost (XALATAN) 0.005 % ophthalmic solution Place 1 drop into both eyes at bedtime.    Yes [provider]  naproxen sodium (ALEVE) 220 MG tablet Take 440 mg by mouth daily.   Yes [provider]  polyethylene glycol powder (GLYCOLAX/MIRALAX) powder Take 34 g by mouth every morning.   Yes  [provider]  tamsulosin (FLOMAX) 0.4 MG CAPS capsule Take 1 capsule (0.4 mg total) by mouth daily. Patient taking differently: Take 0.4 mg by mouth every morning.  06/09/18  Yes Mikey Kirschner, MD  TRESIBA FLEXTOUCH 100 UNIT/ML SOPN FlexTouch Pen Inject 4-6 Units into the muscle at bedtime. For blood sugar levels over 300, give 6 units. Give 4-5 units for levels under 300 02/25/17  Yes [provider]  vitamin B-12 (CYANOCOBALAMIN) 1000 MCG tablet Take 1,000 mcg by mouth every morning.    Yes [provider]  Vitamin D, Ergocalciferol, (DRISDOL) 50000 UNITS CAPS Take 50,000 Units by mouth every Wednesday.    Yes [provider]  XARELTO 20 MG TABS tablet TAKE 1 TABLET BY MOUTH DAILY WITH SUPPER Patient taking differently: Take 20 mg by mouth every morning.  09/30/18  Yes Mikey Kirschner, MD  ZETIA 10 MG tablet TAKE 1 TABLET(10 MG) BY MOUTH AT BEDTIME Patient taking differently: Take 10 mg by mouth every morning.  06/09/18  Yes Mikey Kirschner, MD     Allergies:    No Known Allergies   Physical Exam:   Vitals  Blood pressure (!) 145/66, pulse 82, temperature 99.5 F (37.5 C), temperature source Rectal, resp. rate 18, height 5\' 5"  (1.651 m), weight 63.5 kg, SpO2 98 %.  1.  General: Appears in no acute distress  2. Psychiatric: Alert, not oriented x 3  3. Neurologic: Moving all extremities, neurological examination is limited due to underlying patient's confusion.  4. HEENMT:  Atraumatic, normocephalic  5. Respiratory : Clear bilaterally, no wheezing  6. Cardiovascular : S1 S2 RRR  7. Gastrointestinal:  Abdomen is soft, nontender, no organomegaly     Data Review:    CBC Recent Labs  Lab 03/03/19 2046  WBC 15.9*  HGB 12.1*  HCT 37.7*  PLT 336  MCV 92.2  MCH 29.6  MCHC 32.1  RDW 13.7  LYMPHSABS 0.4*  MONOABS 1.7*  EOSABS 0.1  BASOSABS 0.0    ------------------------------------------------------------------------------------------------------------------  Results for orders placed or performed during the hospital encounter of 03/03/19 (from the past 48 hour(s))  CBG monitoring, ED  Status: Abnormal   Collection Time: 03/03/19  7:11 PM  Result Value Ref Range   Glucose-Capillary 361 (H) 70 - 99 mg/dL  Urinalysis, Routine w reflex microscopic     Status: Abnormal   Collection Time: 03/03/19  8:34 PM  Result Value Ref Range   Color, Urine YELLOW YELLOW   APPearance CLEAR CLEAR   Specific Gravity, Urine 1.025 1.005 - 1.030   pH 5.5 5.0 - 8.0   Glucose, UA >=500 (A) NEGATIVE mg/dL   Hgb urine dipstick NEGATIVE NEGATIVE   Bilirubin Urine SMALL (A) NEGATIVE   Ketones, ur 15 (A) NEGATIVE mg/dL   Protein, ur NEGATIVE NEGATIVE mg/dL   Nitrite NEGATIVE NEGATIVE   Leukocytes,Ua NEGATIVE NEGATIVE    Comment: Performed at [facility], [address], [place], [place] [address]  Urinalysis, Microscopic (reflex)     Status: None   Collection Time: 03/03/19  8:34 PM  Result Value Ref Range   RBC / HPF NONE SEEN 0 - 5 RBC/hpf   WBC, UA 0-5 0 - 5 WBC/hpf   Bacteria, UA NONE SEEN NONE SEEN   Squamous Epithelial / LPF 0-5 0 - 5   Mucus PRESENT     Comment: Performed at [facility], [address] [place], [place] [address]  Comprehensive metabolic panel     Status: Abnormal   Collection Time: 03/03/19  8:46 PM  Result Value Ref Range   Sodium 133 (L) 135 - 145 mmol/L   Potassium 3.7 3.5 - 5.1 mmol/L   Chloride 97 (L) 98 - 111 mmol/L   CO2 24 22 - 32 mmol/L   Glucose, Bld 307 (H) 70 - 99 mg/dL   BUN 26 (H) 8 - 23 mg/dL   Creatinine, Ser 1.31 (H) 0.61 - 1.24 mg/dL   Calcium 8.6 (L) 8.9 - 10.3 mg/dL   Total Protein 6.5 6.5 - 8.1 g/dL   Albumin 3.5 3.5 - 5.0 g/dL   AST 15 15 - 41 U/L   ALT 13 0 - 44 U/L   Alkaline Phosphatase 120 38 - 126 U/L   Total Bilirubin 0.8 0.3 - 1.2 mg/dL   GFR calc non Af Amer 47 (L) >60 mL/min    GFR calc Af Amer 55 (L) >60 mL/min   Anion gap 12 5 - 15    Comment: Performed at [facility], [address], [place], [place] [address]  Troponin I - Once     Status: None   Collection Time: 03/03/19  8:46 PM  Result Value Ref Range   Troponin I <0.03 <0.03 ng/mL    Comment: Performed at [facility], [address], [place], [place] [address]  Lactic acid, plasma     Status: Abnormal   Collection Time: 03/03/19  8:46 PM  Result Value Ref Range   Lactic Acid, Venous 2.2 (HH) 0.5 - 1.9 mmol/L    Comment: CRITICAL RESULT CALLED TO, READ BACK BY AND VERIFIED WITH: WALKER,T ON 03/03/19 AT 2135 BY LOY,C Performed at [facility], [address], [place], [place] [address]   CBC with Differential     Status: Abnormal   Collection Time: 03/03/19  8:46 PM  Result Value Ref Range   WBC 15.9 (H) 4.0 - 10.5 K/uL   RBC 4.09 (L) 4.22 - 5.81 MIL/uL   Hemoglobin 12.1 (L) 13.0 - 17.0 g/dL   HCT 37.7 (L) 39.0 - 52.0 %   MCV 92.2 80.0 - 100.0 fL   MCH 29.6 26.0 - 34.0 pg  MCHC 32.1 30.0 - 36.0 g/dL   RDW 13.7 11.5 - 15.5 %   Platelets 336 150 - 400 K/uL   nRBC 0.0 0.0 - 0.2 %   Neutrophils Relative % 87 %   Neutro Abs 13.6 (H) 1.7 - 7.7 K/uL   Lymphocytes Relative 2 %   Lymphs Abs 0.4 (L) 0.7 - 4.0 K/uL   Monocytes Relative 11 %   Monocytes Absolute 1.7 (H) 0.1 - 1.0 K/uL   Eosinophils Relative 0 %   Eosinophils Absolute 0.1 0.0 - 0.5 K/uL   Basophils Relative 0 %   Basophils Absolute 0.0 0.0 - 0.1 K/uL   Immature Granulocytes 0 %   Abs Immature Granulocytes 0.04 0.00 - 0.07 K/uL    Comment: Performed at [facility], [address] [place], [place] [address]  Lactic acid, plasma     Status: None   Collection Time: 03/03/19 10:34 PM  Result Value Ref Range   Lactic Acid, Venous 1.1 0.5 - 1.9 mmol/L    Comment: Performed at [facility], [address] [place], [place] [address]    Chemistries  Recent Labs  Lab 03/03/19 2046  NA 133*  K 3.7  CL 97*  CO2 24  GLUCOSE 307*  BUN 26*   CREATININE 1.31*  CALCIUM 8.6*  AST 15  ALT 13  ALKPHOS 120  BILITOT 0.8   ------------------------------------------------------------------------------------------------------------------  ------------------------------------------------------------------------------------------------------------------ GFR: Estimated Creatinine Clearance: 31.9 mL/min (A) (by C-G formula based on SCr of 1.31 mg/dL (H)). Liver Function Tests: Recent Labs  Lab 03/03/19 2046  AST 15  ALT 13  ALKPHOS 120  BILITOT 0.8  PROT 6.5  ALBUMIN 3.5   No results for input(s): LIPASE, AMYLASE in the last 168 hours. No results for input(s): AMMONIA in the last 168 hours. Coagulation Profile: No results for input(s): INR, PROTIME in the last 168 hours. Cardiac Enzymes: Recent Labs  Lab 03/03/19 2046  TROPONINI <0.03   BNP (last 3 results) No results for input(s): PROBNP in the last 8760 hours. HbA1C: No results for input(s): HGBA1C in the last 72 hours. CBG: Recent Labs  Lab 03/03/19 1911  GLUCAP 361*    --------------------------------------------------------------------------------------------------------------- Urine analysis:    Component Value Date/Time   COLORURINE YELLOW 03/03/2019 2034   APPEARANCEUR CLEAR 03/03/2019 2034   LABSPEC 1.025 03/03/2019 2034   PHURINE 5.5 03/03/2019 2034   GLUCOSEU >=500 (A) 03/03/2019 2034   HGBUR NEGATIVE 03/03/2019 2034   BILIRUBINUR SMALL (A) 03/03/2019 2034   KETONESUR 15 (A) 03/03/2019 2034   PROTEINUR NEGATIVE 03/03/2019 2034   UROBILINOGEN 0.2 10/29/2011 0555   NITRITE NEGATIVE 03/03/2019 2034   LEUKOCYTESUR NEGATIVE 03/03/2019 2034      Imaging Results:    Dg Chest 1 View  Result Date: 03/03/2019 CLINICAL DATA:  Altered mental status. Hyperglycemia. EXAM: CHEST  1 VIEW COMPARISON:  02/26/2017. FINDINGS: Cardiomegaly. Low lung volumes. Bibasilar subsegmental atelectasis. No consolidation or edema. Old rib fractures. No pneumothorax.  Osteopenia. IMPRESSION: 1. Cardiomegaly with low lung volumes. Bibasilar subsegmental atelectasis. 2. No consolidation or edema. Electronically Signed   By: Staci Righter M.D.   On: 03/03/2019 20:10   Ct Head Wo Contrast  Result Date: 03/03/2019 CLINICAL DATA:  85 year old male with confusion. EXAM: CT HEAD WITHOUT CONTRAST TECHNIQUE: Contiguous axial images were obtained from the base of the skull through the vertex without intravenous contrast. COMPARISON:  Head CT dated 02/11/2018 FINDINGS: Brain: There is mild to moderate age-related atrophy and chronic microvascular ischemic changes. There is a 3.6  x 1.1 cm limited form high attenuating extra-axial collection along the left temporal lobe which may represent a subdural or epidural hemorrhage. There is minimal associated mass effect on the left temporal lobe. No midline shift. Vascular: No hyperdense vessel or unexpected calcification. Skull: Normal. Negative for fracture or focal lesion. Sinuses/Orbits: There is near complete opacification of the left maxillary sinus with diffuse mucoperiosteal thickening of paranasal sinuses. The mastoid air cells are clear. Other: None IMPRESSION: 1. High attenuating extra-axial collection along the left temporal lobe which may represent a subdural or epidural hemorrhage. Close follow-up with CT recommended. 2. Age-related atrophy and chronic microvascular ischemic changes. 3. Paranasal sinus disease. These results were called by telephone at the time of interpretation on 03/03/2019 at 8:11 pm to Dr. Francine Graven , who verbally acknowledged these results. Electronically Signed   By: Anner Crete M.D.   On: 03/03/2019 20:13    My personal review of EKG: Rhythm NSR   Assessment & Plan:    Active Problems:   Subdural hemorrhage (HCC)   1. Subdural versus epidural hemorrhage-seen on CT head along the left temporal lobe there is subdural versus epidural hemorrhage.  I discussed with patient's son on phone, he  says that family does not want brain surgery but if a bur hole procedure can be done to drain the bleed/hematoma they would like the patient to be seen by a neurosurgeon.  I will transfer patient to [facility] for evaluation by neurosurgery.  2. History of DVT/PE-Xarelto is currently on hold.  Eduardo Lee has been given to reverse the effects of Xarelto.  3. Diabetes mellitus-we will keep patient n.p.o., start sliding scale insulin, check CBG every 6 hours.   DVT Prophylaxis-   SCDs  AM Labs Ordered, also please review Full Orders  Family Communication: Admission, patients condition and plan of care including tests being ordered have been discussed with the patient  who indicate understanding and agree with the plan and Code Status.  Code Status:  DNR  Admission status: Inpatient: Based on patients clinical presentation and evaluation of above clinical data, I have made determination that patient meets Inpatient criteria at this time.  Time spent in minutes : 60 min   Oswald Hillock M.D on 03/03/2019 at 11:50 PM

## 2019-03-03 NOTE — Telephone Encounter (Signed)
Daughter sated they will contact council on aging to see if they can find someone to help on a daily basis and also will touch base with hospice to see if he qualifies for hospice care- they state the patient is rapidly declining daily

## 2019-03-03 NOTE — ED Notes (Signed)
Patient's son spoke with myself as charge nurse and Dr. Billee Cashing Mannus. Patient's son RAYNE LOISEAU stated via phone that patient and the family would not want any surgical interventions done for the patient. Family also states that patient has an advance directive that states no resuscitation.

## 2019-03-03 NOTE — Telephone Encounter (Signed)
Eduardo Lee (daughter-in-law) called requesting order for home health eval  States they need help caring for patient at home.  Mentioned a nurse to come out and possibly someone to help with bathing  Please advise

## 2019-03-04 ENCOUNTER — Inpatient Hospital Stay (HOSPITAL_COMMUNITY): Payer: Medicare Other

## 2019-03-04 DIAGNOSIS — I6200 Nontraumatic subdural hemorrhage, unspecified: Secondary | ICD-10-CM

## 2019-03-04 DIAGNOSIS — E109 Type 1 diabetes mellitus without complications: Secondary | ICD-10-CM

## 2019-03-04 LAB — COMPREHENSIVE METABOLIC PANEL
ALT: 13 U/L (ref 0–44)
AST: 13 U/L — ABNORMAL LOW (ref 15–41)
Albumin: 2.9 g/dL — ABNORMAL LOW (ref 3.5–5.0)
Alkaline Phosphatase: 109 U/L (ref 38–126)
Anion gap: 14 (ref 5–15)
BUN: 24 mg/dL — ABNORMAL HIGH (ref 8–23)
CO2: 21 mmol/L — ABNORMAL LOW (ref 22–32)
Calcium: 8.4 mg/dL — ABNORMAL LOW (ref 8.9–10.3)
Chloride: 97 mmol/L — ABNORMAL LOW (ref 98–111)
Creatinine, Ser: 1.39 mg/dL — ABNORMAL HIGH (ref 0.61–1.24)
GFR calc Af Amer: 51 mL/min — ABNORMAL LOW (ref >60)
GFR calc non Af Amer: 44 mL/min — ABNORMAL LOW (ref >60)
Glucose, Bld: 411 mg/dL — ABNORMAL HIGH (ref 70–99)
Potassium: 4.4 mmol/L (ref 3.5–5.1)
Sodium: 132 mmol/L — ABNORMAL LOW (ref 135–145)
Total Bilirubin: 1.3 mg/dL — ABNORMAL HIGH (ref 0.3–1.2)
Total Protein: 5.7 g/dL — ABNORMAL LOW (ref 6.5–8.1)

## 2019-03-04 LAB — CBC
HCT: 32.7 % — ABNORMAL LOW (ref 39.0–52.0)
Hemoglobin: 10.8 g/dL — ABNORMAL LOW (ref 13.0–17.0)
MCH: 30.4 pg (ref 26.0–34.0)
MCHC: 33 g/dL (ref 30.0–36.0)
MCV: 92.1 fL (ref 80.0–100.0)
Platelets: 323 10*3/uL (ref 150–400)
RBC: 3.55 MIL/uL — ABNORMAL LOW (ref 4.22–5.81)
RDW: 13.7 % (ref 11.5–15.5)
WBC: 11.9 10*3/uL — ABNORMAL HIGH (ref 4.0–10.5)
nRBC: 0 % (ref 0.0–0.2)

## 2019-03-04 LAB — GLUCOSE, CAPILLARY
Glucose-Capillary: 374 mg/dL — ABNORMAL HIGH (ref 70–99)
Glucose-Capillary: 238 mg/dL — ABNORMAL HIGH (ref 70–99)
Glucose-Capillary: 170 mg/dL — ABNORMAL HIGH (ref 70–99)
Glucose-Capillary: 92 mg/dL (ref 70–99)

## 2019-03-04 LAB — HEMOGLOBIN A1C
Hgb A1c MFr Bld: 7.8 % — ABNORMAL HIGH (ref 4.8–5.6)
Mean Plasma Glucose: 177.16 mg/dL

## 2019-03-04 MED ORDER — CITALOPRAM HYDROBROMIDE 10 MG PO TABS
20.0000 mg | ORAL_TABLET | Freq: Every morning | ORAL | Status: DC
  Administered 2019-03-05 – 2019-03-09 (×5): 20 mg via ORAL
  Filled 2019-03-04 (×5): qty 2

## 2019-03-04 MED ORDER — ACETAMINOPHEN 325 MG PO TABS
650.0000 mg | ORAL_TABLET | Freq: Four times a day (QID) | ORAL | Status: DC | PRN
  Administered 2019-03-05: 02:00:00 650 mg via ORAL
  Filled 2019-03-04: qty 2

## 2019-03-04 MED ORDER — ONDANSETRON HCL 4 MG PO TABS: 4.0000 mg | ORAL_TABLET | Freq: Four times a day (QID) | ORAL | Status: DC | PRN

## 2019-03-04 MED ORDER — ONDANSETRON HCL 4 MG/2ML IJ SOLN: 4.0000 mg | Freq: Four times a day (QID) | INTRAMUSCULAR | Status: DC | PRN

## 2019-03-04 MED ORDER — INSULIN ASPART 100 UNIT/ML ~~LOC~~ SOLN
0.0000 [IU] | Freq: Three times a day (TID) | SUBCUTANEOUS | Status: DC
  Administered 2019-03-04: 13:00:00 3 [IU] via SUBCUTANEOUS
  Administered 2019-03-04: 07:00:00 9 [IU] via SUBCUTANEOUS
  Administered 2019-03-04: 18:00:00 2 [IU] via SUBCUTANEOUS
  Administered 2019-03-05: 07:00:00 7 [IU] via SUBCUTANEOUS
  Administered 2019-03-05: 12:00:00 2 [IU] via SUBCUTANEOUS
  Administered 2019-03-06: 07:00:00 7 [IU] via SUBCUTANEOUS
  Administered 2019-03-06: 18:00:00 2 [IU] via SUBCUTANEOUS
  Administered 2019-03-06: 12:00:00 3 [IU] via SUBCUTANEOUS
  Administered 2019-03-07 (×3): 5 [IU] via SUBCUTANEOUS
  Administered 2019-03-08: 18:00:00 2 [IU] via SUBCUTANEOUS
  Administered 2019-03-08: 07:00:00 9 [IU] via SUBCUTANEOUS
  Administered 2019-03-08 – 2019-03-09 (×2): 5 [IU] via SUBCUTANEOUS

## 2019-03-04 MED ORDER — ACETAMINOPHEN 650 MG RE SUPP: 650.0000 mg | Freq: Four times a day (QID) | RECTAL | Status: DC | PRN

## 2019-03-04 MED ORDER — INSULIN DETEMIR 100 UNIT/ML ~~LOC~~ SOLN
5.0000 [IU] | Freq: Every day | SUBCUTANEOUS | Status: DC
  Administered 2019-03-04 – 2019-03-08 (×5): 5 [IU] via SUBCUTANEOUS
  Filled 2019-03-04 (×7): qty 0.05

## 2019-03-04 MED ORDER — LATANOPROST 0.005 % OP SOLN
1.0000 [drp] | Freq: Every day | OPHTHALMIC | Status: DC
  Administered 2019-03-04 – 2019-03-08 (×6): 1 [drp] via OPHTHALMIC
  Filled 2019-03-04: qty 2.5

## 2019-03-04 NOTE — Evaluation (Signed)
Physical Therapy Evaluation Patient Details Name: Eduardo Lee MRN: [id number] DOB: September 06, 1926 Today's Date: 03/04/2019   History of Present Illness  Pt is a 85 y.o. M with significant PMH of DVT, hypertension, legally blind, dementia who was brought in for generalized weakness and confusion. Head CT showing left subdural hematoma.   Clinical Impression  Pt admitted with above. Upon entrance to room, pt attempting to get out of bed with bed alarm sounding. Pt assisted onto [facility] with +2 assist for safety. Pt is pleasantly confused and presents with decreased functional mobility secondary to weakness, decreased cognition, visual impairment, and poor balance.   Extensive phone discussion with pt son, [name]. [name] shared that pt lived with his girlfriend in [place], [place] until 2 weeks ago when she was no longer able to care for him. Pt currently lives with his son and daughter in law. [name] has been assisting pt with all ADL's and stand pivot transfers into wheelchair. [name] reports pt has required increased physical assistance since March 17, 2023 and has been "dead weight." [name] states, "I am 68 and don't feel like I am able to take good care of him." Recommending SNF to maximize functional independence and decrease caregiver burden.     Follow Up Recommendations SNF;Supervision/Assistance - 24 hour    Equipment Recommendations  None recommended by PT    Recommendations for Other Services       Precautions / Restrictions Precautions Precautions: Fall Precaution Comments: Pt is blind Restrictions Weight Bearing Restrictions: No      Mobility  Bed Mobility Overal bed mobility: Needs Assistance Bed Mobility: Supine to Sit;Sit to Supine     Supine to sit: Min guard Sit to supine: Mod assist   General bed mobility comments: Pt attempting to get out of bed upon entrance. To retun to bed, required mod assist for trunk guidance  Transfers Overall transfer level: Needs assistance Equipment used:  Rolling walker (2 wheeled) Transfers: Sit to/from [facility] Sit to Stand: Mod assist Stand pivot transfers: Mod assist       General transfer comment: requiring moderate assistance for standing and stand pivot transfer from bed <> BSC. Auditory/verbal cues used for direction finding and sequencing  Ambulation/Gait                Stairs            Wheelchair Mobility    Modified Rankin (Stroke Patients Only)       Balance Overall balance assessment: Needs assistance Sitting-balance support: Feet supported Sitting balance-Leahy Scale: Fair     Standing balance support: Bilateral upper extremity supported Standing balance-Leahy Scale: Poor Standing balance comment: reliant on external support                             Pertinent Vitals/Pain Pain Assessment: Faces Faces Pain Scale: No hurt    Home Living Family/patient expects to be discharged to:: Private residence Living Arrangements: Children(son, daughter in law) Available Help at Discharge: Family Type of Home: House Home Access: Stairs to enter   Technical brewer of Steps: 2 Home Layout: One level Home Equipment: Wheelchair - manual      Prior Function Level of Independence: Needs assistance   Gait / Transfers Assistance Needed: pt son assists with stand pivot transfer into wheelchair   ADL's / Homemaking Assistance Needed: pt son assists with all ADL's        Hand Dominance  Extremity/Trunk Assessment   Upper Extremity Assessment Upper Extremity Assessment: Generalized weakness    Lower Extremity Assessment Lower Extremity Assessment: Generalized weakness       Communication   Communication: HOH  Cognition Arousal/Alertness: Awake/alert Behavior During Therapy: WFL for tasks assessed/performed Overall Cognitive Status: History of cognitive impairments - at baseline                                 General Comments:  history of dementia. pt with significantly decreased safety awareness      General Comments      Exercises     Assessment/Plan    PT Assessment Patient needs continued PT services  PT Problem List Decreased strength;Decreased activity tolerance;Decreased balance;Decreased mobility;Decreased coordination;Decreased cognition;Decreased safety awareness       PT Treatment Interventions DME instruction;Gait training;Functional mobility training;Therapeutic activities;Therapeutic exercise;Balance training;Patient/family education    PT Goals (Current goals can be found in the Care Plan section)  Acute Rehab PT Goals Patient Stated Goal: none stated PT Goal Formulation: With patient/family Time For Goal Achievement: 03/18/19 Potential to Achieve Goals: Fair    Frequency Min 3X/week   Barriers to discharge        Co-evaluation               AM-PAC PT "6 Clicks" Mobility  Outcome Measure Help needed turning from your back to your side while in a flat bed without using bedrails?: None Help needed moving from lying on your back to sitting on the side of a flat bed without using bedrails?: A Little Help needed moving to and from a bed to a chair (including a wheelchair)?: A Lot Help needed standing up from a chair using your arms (e.g., wheelchair or bedside chair)?: A Lot Help needed to walk in hospital room?: A Lot Help needed climbing 3-5 steps with a railing? : Total 6 Click Score: 14    End of Session Equipment Utilized During Treatment: Gait belt Activity Tolerance: Patient tolerated treatment well Patient left: in bed;with call bell/phone within reach;with bed alarm set Nurse Communication: Mobility status PT Visit Diagnosis: Unsteadiness on feet (R26.81);Other abnormalities of gait and mobility (R26.89);Muscle weakness (generalized) (M62.81)    Time: 2979-8921 PT Time Calculation (min) (ACUTE ONLY): 26 min   Charges:   PT Evaluation $PT Eval Moderate  Complexity: 1 Mod PT Treatments $Therapeutic Activity: 8-22 mins        Ellamae Sia, PT, DPT Acute Rehabilitation Services Pager [phone] Office [phone]   Willy Eddy 03/04/2019, 4:24 PM

## 2019-03-04 NOTE — Progress Notes (Signed)
Inpatient Diabetes Program Recommendations  AACE/ADA: New Consensus Statement on Inpatient Glycemic Control (2015)  Target Ranges:  Prepandial:   less than 140 mg/dL      Peak postprandial:   less than 180 mg/dL (1-2 hours)      Critically ill patients:  140 - 180 mg/dL   Lab Results  Component Value Date   GLUCAP 374 (H) 03/04/2019   HGBA1C 7.8 (H) 03/04/2019    Review of Glycemic Control Results for Eduardo Lee, Eduardo "PETE" (MRN [id number]) as of 03/04/2019 10:50  Ref. Range 03/03/2019 19:11 03/04/2019 06:07  Glucose-Capillary Latest Ref Range: 70 - 99 mg/dL 361 (H) 374 (H)   Diabetes history: Type 1 DM Outpatient Diabetes medications: Humalog 4-7 units TID, Tresiba 6 units QHS Current orders for Inpatient glycemic control: Novolog 0-9 units TID  Inpatient Diabetes Program Recommendations:    Patient has been followed outpatient by Dr Altheimer, endocrinologist. Per notes, patient is a type 1 DM, thus requiring basal/carb coverage. Noted patient NPO.  Add Levemir 5 units QD (to start now). Noted AM labs. MD text paged. Will continue to follow.  Thanks, Bronson Curb, MSN, RNC-OB Diabetes Coordinator [phone] (8a-5p)

## 2019-03-04 NOTE — Progress Notes (Signed)
PROGRESS NOTE    Eduardo Lee  OYD:[id number] DOB: September 19, 1926 DOA: 03/03/2019 PCP: Eduardo Kirschner, MD   Brief Narrative:  HPI On 03/03/2019 by Eduardo Lee  is a 85 y.o. 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  As per ED notes family stated that patient usually could stand to pivot to transfer but was unable to do that today.  Also had high blood sugar. No other history is obtainable, patient is a poor historian due to underlying dementia and confusion. In the ED, CT head showed subdural or epidural hemorrhage along the left temporal lobe.  Neurosurgery Eduardo Lee was consulted by Eduardo Lee, Who recommended K Centra to reverse the effects of Xarelto.  Also if family not keen for surgery patient could stay at [facility] hospital. Assessment & Plan   Subdural vs epidural hemorrhage  -Noted on CT head: High attenuating extra-axial collection along the left temporal lobe may represent subdural or epidural hemorrhage -Repeat CT head today since yesterday.  Left lateral frontotemporal convexity shows maximal thickness of 9 mm, 10 mm previously -Neurosurgery consulted and appreciated, Eduardo Lee -Per H&P, family does not wish for brain surgery but open to brain whole procedure to drain bleed or hematoma -Continue to monitor  History of DVT/PE -Patient was on Xarelto which is now currently held.  Patient was given Kcentra for reversal  Diabetes mellitus, type 1 -Diabetes coordinator consulted and appreciated, commended adding 5 units of Levemir -Continue insulin sliding scale with CBG monitoring  History of deafness and blindness -Stable  Depression/anxiety -Continue Celexa, Klonopin held  Chronic kidney disease, stage III -creatinine stable, creatinine approximate 0.9-1.2  DVT Prophylaxis  SCDs  Code Status:  DNR  Family Communication: None at bedside  Disposition Plan: Admitted. Pending neurosurgery consult  Consultants Neurosurgery  Procedures  None  Antibiotics   Anti-infectives (From admission, onward)   None      Subjective:   Eduardo Lee seen and examined today.  Patient with history of dementia and legally blind.  Objective:   Vitals:   03/04/19 0100 03/04/19 0130 03/04/19 0259 03/04/19 0818  BP: 138/70 (!) 131/50 127/62 (!) 112/53  Pulse:  89 96 84  Resp: 20 17 18 16   Temp:   97.9 F (36.6 C) 98.1 F (36.7 C)  TempSrc:   Oral Oral  SpO2:  96% 97% 93%  Weight:   63.1 kg   Height:   5\' 7"  (1.702 m)     Intake/Output Summary (Last 24 hours) at 03/04/2019 1106 Last data filed at 03/04/2019 0600 Gross per 24 hour  Intake 225 ml  Output -  Net 225 ml   Filed Weights   03/03/19 1915 03/04/19 0259  Weight: 63.5 kg 63.1 kg    Exam  General: Well developed, chronically ill appearing, NAD  HEENT: NCAT, mucous membranes moist.   Neck: Supple  Cardiovascular: S1 S2 auscultated, RRR  Respiratory: Clear to auscultation bilaterally with equal chest rise  Abdomen: Soft, nontender, nondistended, + bowel sounds  Extremities: warm dry without cyanosis clubbing or edema  Neuro: Awake, difficult to assess orientation given blindness and deafness   Data Reviewed: I have personally reviewed following labs and imaging studies  CBC: Recent Labs  Lab 03/03/19 2046 03/04/19 0548  WBC 15.9* 11.9*  NEUTROABS 13.6*  --   HGB 12.1* 10.8*  HCT 37.7* 32.7*  MCV 92.2 92.1  PLT 336 621   Basic Metabolic Panel: Recent Labs  Lab 03/03/19 2046 03/04/19 0548  NA 133* 132*  K 3.7 4.4  CL 97* 97*  CO2 24 21*  GLUCOSE 307* 411*  BUN 26* 24*  CREATININE 1.31* 1.39*  CALCIUM 8.6* 8.4*   GFR: Estimated Creatinine Clearance: 30.9 mL/min (A) (by C-G formula based on SCr of 1.39 mg/dL (H)). Liver Function Tests: Recent Labs  Lab 03/03/19 2046 03/04/19 0548  AST  15 13*  ALT 13 13  ALKPHOS 120 109  BILITOT 0.8 1.3*  PROT 6.5 5.7*  ALBUMIN 3.5 2.9*   No results for input(s): LIPASE, AMYLASE in the last 168 hours. No results for input(s): AMMONIA in the last 168 hours. Coagulation Profile: No results for input(s): INR, PROTIME in the last 168 hours. Cardiac Enzymes: Recent Labs  Lab 03/03/19 2046  TROPONINI <0.03   BNP (last 3 results) No results for input(s): PROBNP in the last 8760 hours. HbA1C: Recent Labs    03/04/19 0548  HGBA1C 7.8*   CBG: Recent Labs  Lab 03/03/19 1911 03/04/19 0607  GLUCAP 361* 374*   Lipid Profile: No results for input(s): CHOL, HDL, LDLCALC, TRIG, CHOLHDL, LDLDIRECT in the last 72 hours. Thyroid Function Tests: No results for input(s): TSH, T4TOTAL, FREET4, T3FREE, THYROIDAB in the last 72 hours. Anemia Panel: No results for input(s): VITAMINB12, FOLATE, FERRITIN, TIBC, IRON, RETICCTPCT in the last 72 hours. Urine analysis:    Component Value Date/Time   COLORURINE YELLOW 03/03/2019 2034   APPEARANCEUR CLEAR 03/03/2019 2034   LABSPEC 1.025 03/03/2019 2034   PHURINE 5.5 03/03/2019 2034   GLUCOSEU >=500 (A) 03/03/2019 2034   HGBUR NEGATIVE 03/03/2019 2034   BILIRUBINUR SMALL (A) 03/03/2019 2034   KETONESUR 15 (A) 03/03/2019 2034   PROTEINUR NEGATIVE 03/03/2019 2034   UROBILINOGEN 0.2 10/29/2011 0555   NITRITE NEGATIVE 03/03/2019 2034   LEUKOCYTESUR NEGATIVE 03/03/2019 2034   Sepsis Labs: @LABRCNTIP (procalcitonin:4,lacticidven:4)  )No results found for this or any previous visit (from the past 240 hour(s)).    Radiology Studies: Dg Chest 1 View  Result Date: 03/03/2019 CLINICAL DATA:  Altered mental status. Hyperglycemia. EXAM: CHEST  1 VIEW COMPARISON:  02/26/2017. FINDINGS: Cardiomegaly. Low lung volumes. Bibasilar subsegmental atelectasis. No consolidation or edema. Old rib fractures. No pneumothorax. Osteopenia. IMPRESSION: 1. Cardiomegaly with low lung volumes. Bibasilar subsegmental  atelectasis. 2. No consolidation or edema. Electronically Signed   By: Staci Righter M.D.   On: 03/03/2019 20:10   Ct Head Wo Contrast  Result Date: 03/04/2019 CLINICAL DATA:  Followup left-sided extra-axial hemorrhage. EXAM: CT HEAD WITHOUT CONTRAST TECHNIQUE: Contiguous axial images were obtained from the base of the skull through the vertex without intravenous contrast. COMPARISON:  03/03/2019 FINDINGS: Brain: No change since the previous study. Extra-axial hematoma in the left lateral frontotemporal convexity shows maximal thickness of 9 mm compared with 10 mm previously. This is favored to be subdural, though epidural hematoma is not completely excluded. No significant mass effect. No other intracranial hemorrhage evident. Generalized brain atrophy with chronic small-vessel ischemic changes throughout the brain as seen previously. Vascular: There is atherosclerotic calcification of the major vessels at the base of the brain. Skull: No skull fracture or skull base fracture seen. Sinuses/Orbits: Chronic inflammatory opacification of the left maxillary sinus. Some chronic ethmoid disease, left more than right. Orbits negative. Other: None IMPRESSION: No worsening since yesterday. Extra-axial hematoma in the left lateral frontotemporal convexity shows maximal thickness of 9 mm today, measured  at 10 mm yesterday. No evidence of additional bleeding. No mass effect. This could be either subdural or epidural, but is not enlarging or worsening. Electronically Signed   By: Nelson Chimes M.D.   On: 03/04/2019 09:44   Ct Head Wo Contrast  Result Date: 03/03/2019 CLINICAL DATA:  85 year old male with confusion. EXAM: CT HEAD WITHOUT CONTRAST TECHNIQUE: Contiguous axial images were obtained from the base of the skull through the vertex without intravenous contrast. COMPARISON:  Head CT dated 02/11/2018 FINDINGS: Brain: There is mild to moderate age-related atrophy and chronic microvascular ischemic changes. There is a  3.6 x 1.1 cm limited form high attenuating extra-axial collection along the left temporal lobe which may represent a subdural or epidural hemorrhage. There is minimal associated mass effect on the left temporal lobe. No midline shift. Vascular: No hyperdense vessel or unexpected calcification. Skull: Normal. Negative for fracture or focal lesion. Sinuses/Orbits: There is near complete opacification of the left maxillary sinus with diffuse mucoperiosteal thickening of paranasal sinuses. The mastoid air cells are clear. Other: None IMPRESSION: 1. High attenuating extra-axial collection along the left temporal lobe which may represent a subdural or epidural hemorrhage. Close follow-up with CT recommended. 2. Age-related atrophy and chronic microvascular ischemic changes. 3. Paranasal sinus disease. These results were called by telephone at the time of interpretation on 03/03/2019 at 8:11 pm to Dr. Francine Graven , who verbally acknowledged these results. Electronically Signed   By: Anner Crete M.D.   On: 03/03/2019 20:13     Scheduled Meds: . citalopram  20 mg Oral q morning - 10a  . insulin aspart  0-9 Units Subcutaneous TID WC  . latanoprost  1 drop Both Eyes QHS   Continuous Infusions: . sodium chloride 75 mL/hr at 03/04/19 0328     LOS: 1 day   Time Spent in minutes   30 minutes  Rosemary Mossbarger D.O. on 03/04/2019 at 11:06 AM  Between 7am to 7pm - Please see pager noted on [email or web address]  After 7pm go to [email or web address]  And look for the night coverage person covering for me after hours  [facility] Office  [phone]

## 2019-03-04 NOTE — Consult Note (Signed)
Reason for Consult: left subdural hematoma Referring Physician:  Dr. Gardner Candle is an 85 y.o. male.  HPI:  The patient is a 85 year old mildly demented, blind, and hard at hearing white male on Eliquis for recurrent DVTs.  By report he has been a bit "wobbly".  He was taken to the [facility] and was worked up with a head CT.  This demonstrated a left subdural hematoma versus epidural hematoma.  I was contacted.  I recommended that the patient be transferred to [facility] if the patient's family would consider surgery in the event that the hematoma enlarged .  Evidently they would consider surgery in a patient was transferred to [facility].   The patient's Eliquis anticoagulation was reversed. A neurosurgical consultation has been requested.    Presently the patient is alert and pleasant.  He has no complaints.  He denies headaches, neck pain, back pain, etc.  The best I can tell he is at his baseline.  Past Medical History:  Diagnosis Date  . Ataxia   . Chronic anticoagulation   . Diabetes mellitus   . DVT (deep venous thrombosis) (HCC)    recurrent  . Falls   . Gastroparesis   . Hypertension   . Legally blind    diabetic retinopathy  . Mild dementia ([place])   . Neuropathy   . Osteoporosis   . Pulmonary embolism ([place]) 12/2010   on blood thinners  . Stroke ([place])   . Weight loss     Past Surgical History:  Procedure Laterality Date  . ANKLE SURGERY     Patient states that he had pins place in the left foot  . BACK SURGERY  2008  . CARPAL TUNNEL RELEASE  08/2011   Left hand  . COLONOSCOPY  01/2011  . ORIF HIP FRACTURE  10/31/2011   Procedure: OPEN REDUCTION INTERNAL FIXATION HIP;  Surgeon: Arther Abbott, MD;  Location: [facility];  Service: Orthopedics;  Laterality: Right;  with Gamma Nail  . TRANSURETHRAL RESECTION OF PROSTATE  1984  . UPPER GASTROINTESTINAL ENDOSCOPY  01/2011    Family History  Problem Relation Age of Onset  . Diabetes  Mother   . Diabetes Father   . Diabetes Sister   . Diabetes Brother   . Diabetes Sister   . Diabetes Son   . Healthy Son   . Healthy Son     Social History:  reports that he quit smoking about 37 years ago. His smoking use included cigars. He quit after 20.00 years of use. He has never used smokeless tobacco. He reports that he does not drink alcohol or use drugs.  Allergies: No Known Allergies  Medications:  I have reviewed the patient's current medications. Prior to Admission:  Medications Prior to Admission  Medication Sig Dispense Refill Last Dose  . aspirin 81 MG tablet Take 81 mg by mouth every morning.    03/03/2019 at Unknown time  . citalopram (CELEXA) 40 MG tablet Take 1 tablet (40 mg total) by mouth daily. (Patient taking differently: Take 20 mg by mouth every morning. ) 90 tablet 1 03/03/2019 at Unknown time  . clonazePAM (KLONOPIN) 0.5 MG tablet Take 1 tablet (0.5 mg total) by mouth daily as needed (agitiation). 24 tablet 1 UNKNOWN  . insulin lispro (HUMALOG KWIKPEN) 100 UNIT/ML KwikPen Inject 4-7 Units into the skin 3 (three) times daily. PER SLIDING SCALE   03/03/2019 at Unknown time  . latanoprost (XALATAN) 0.005 % ophthalmic solution  Place 1 drop into both eyes at bedtime.    03/02/2019 at Unknown time  . naproxen sodium (ALEVE) 220 MG tablet Take 440 mg by mouth daily.   03/03/2019 at Unknown time  . polyethylene glycol powder (GLYCOLAX/MIRALAX) powder Take 34 g by mouth every morning.   03/03/2019 at Unknown time  . tamsulosin (FLOMAX) 0.4 MG CAPS capsule Take 1 capsule (0.4 mg total) by mouth daily. (Patient taking differently: Take 0.4 mg by mouth every morning. ) 30 capsule 5 03/03/2019 at Unknown time  . TRESIBA FLEXTOUCH 100 UNIT/ML SOPN FlexTouch Pen Inject 4-6 Units into the muscle at bedtime. For blood sugar levels over 300, give 6 units. Give 4-5 units for levels under 300  98 03/02/2019 at Unknown time  . vitamin B-12 (CYANOCOBALAMIN) 1000 MCG tablet Take 1,000 mcg by mouth  every morning.    03/03/2019 at Unknown time  . Vitamin D, Ergocalciferol, (DRISDOL) 50000 UNITS CAPS Take 50,000 Units by mouth every Wednesday.    03/03/2019 at Unknown time  . XARELTO 20 MG TABS tablet TAKE 1 TABLET BY MOUTH DAILY WITH SUPPER (Patient taking differently: Take 20 mg by mouth every morning. ) 30 tablet 0 03/03/2019 at 930  . ZETIA 10 MG tablet TAKE 1 TABLET(10 MG) BY MOUTH AT BEDTIME (Patient taking differently: Take 10 mg by mouth every morning. ) 90 tablet 0 03/03/2019 at Unknown time   Scheduled: . citalopram  20 mg Oral q morning - 10a  . insulin aspart  0-9 Units Subcutaneous TID WC  . latanoprost  1 drop Both Eyes QHS   Continuous: . sodium chloride 75 mL/hr at 03/04/19 2841   LKG:[id number] **OR** acetaminophen, ondansetron **OR** ondansetron (ZOFRAN) IV Anti-infectives (From admission, onward)   None       Results for orders placed or performed during the hospital encounter of 03/03/19 (from the past 48 hour(s))  CBG monitoring, ED     Status: Abnormal   Collection Time: 03/03/19  7:11 PM  Result Value Ref Range   Glucose-Capillary 361 (H) 70 - 99 mg/dL  Urinalysis, Routine w reflex microscopic     Status: Abnormal   Collection Time: 03/03/19  8:34 PM  Result Value Ref Range   Color, Urine YELLOW YELLOW   APPearance CLEAR CLEAR   Specific Gravity, Urine 1.025 1.005 - 1.030   pH 5.5 5.0 - 8.0   Glucose, UA >=500 (A) NEGATIVE mg/dL   Hgb urine dipstick NEGATIVE NEGATIVE   Bilirubin Urine SMALL (A) NEGATIVE   Ketones, ur 15 (A) NEGATIVE mg/dL   Protein, ur NEGATIVE NEGATIVE mg/dL   Nitrite NEGATIVE NEGATIVE   Leukocytes,Ua NEGATIVE NEGATIVE    Comment: Performed at [facility], [address], [place], [place] [address]  Urinalysis, Microscopic (reflex)     Status: None   Collection Time: 03/03/19  8:34 PM  Result Value Ref Range   RBC / HPF NONE SEEN 0 - 5 RBC/hpf   WBC, UA 0-5 0 - 5 WBC/hpf   Bacteria, UA NONE SEEN NONE SEEN   Squamous Epithelial /  LPF 0-5 0 - 5   Mucus PRESENT     Comment: Performed at [facility], [address], [place],  [address]  Comprehensive metabolic panel     Status: Abnormal   Collection Time: 03/03/19  8:46 PM  Result Value Ref Range   Sodium 133 (L) 135 - 145 mmol/L   Potassium 3.7 3.5 - 5.1 mmol/L   Chloride 97 (L) 98 - 111 mmol/L  CO2 24 22 - 32 mmol/L   Glucose, Bld 307 (H) 70 - 99 mg/dL   BUN 26 (H) 8 - 23 mg/dL   Creatinine, Ser 1.31 (H) 0.61 - 1.24 mg/dL   Calcium 8.6 (L) 8.9 - 10.3 mg/dL   Total Protein 6.5 6.5 - 8.1 g/dL   Albumin 3.5 3.5 - 5.0 g/dL   AST 15 15 - 41 U/L   ALT 13 0 - 44 U/L   Alkaline Phosphatase 120 38 - 126 U/L   Total Bilirubin 0.8 0.3 - 1.2 mg/dL   GFR calc non Af Amer 47 (L) >60 mL/min   GFR calc Af Amer 55 (L) >60 mL/min   Anion gap 12 5 - 15    Comment: Performed at [facility], [address] [place], [place] [address]  Troponin I - Once     Status: None   Collection Time: 03/03/19  8:46 PM  Result Value Ref Range   Troponin I <0.03 <0.03 ng/mL    Comment: Performed at [facility], [address], [place], [place] [address]  Lactic acid, plasma     Status: Abnormal   Collection Time: 03/03/19  8:46 PM  Result Value Ref Range   Lactic Acid, Venous 2.2 (HH) 0.5 - 1.9 mmol/L    Comment: CRITICAL RESULT CALLED TO, READ BACK BY AND VERIFIED WITH: WALKER,T ON 03/03/19 AT 2135 BY LOY,C Performed at [facility], [address] [place], [place] [address]   CBC with Differential     Status: Abnormal   Collection Time: 03/03/19  8:46 PM  Result Value Ref Range   WBC 15.9 (H) 4.0 - 10.5 K/uL   RBC 4.09 (L) 4.22 - 5.81 MIL/uL   Hemoglobin 12.1 (L) 13.0 - 17.0 g/dL   HCT 37.7 (L) 39.0 - 52.0 %   MCV 92.2 80.0 - 100.0 fL   MCH 29.6 26.0 - 34.0 pg   MCHC 32.1 30.0 - 36.0 g/dL   RDW 13.7 11.5 - 15.5 %   Platelets 336 150 - 400 K/uL   nRBC 0.0 0.0 - 0.2 %   Neutrophils Relative % 87 %   Neutro Abs 13.6 (H) 1.7 - 7.7 K/uL   Lymphocytes Relative 2 %   Lymphs  Abs 0.4 (L) 0.7 - 4.0 K/uL   Monocytes Relative 11 %   Monocytes Absolute 1.7 (H) 0.1 - 1.0 K/uL   Eosinophils Relative 0 %   Eosinophils Absolute 0.1 0.0 - 0.5 K/uL   Basophils Relative 0 %   Basophils Absolute 0.0 0.0 - 0.1 K/uL   Immature Granulocytes 0 %   Abs Immature Granulocytes 0.04 0.00 - 0.07 K/uL    Comment: Performed at [facility], [address] [place], [place] [address]  Lactic acid, plasma     Status: None   Collection Time: 03/03/19 10:34 PM  Result Value Ref Range   Lactic Acid, Venous 1.1 0.5 - 1.9 mmol/L    Comment: Performed at [facility], [address], [place],  [address]  Hemoglobin A1c     Status: Abnormal   Collection Time: 03/04/19  5:48 AM  Result Value Ref Range   Hgb A1c MFr Bld 7.8 (H) 4.8 - 5.6 %    Comment: (NOTE) Pre diabetes:          5.7%-6.4% Diabetes:              >6.4% Glycemic control for   <7.0% adults with diabetes    Mean Plasma Glucose 177.16 mg/dL  Comment: Performed at [facility], [place] [address] [place], [place] [address]  CBC     Status: Abnormal   Collection Time: 03/04/19  5:48 AM  Result Value Ref Range   WBC 11.9 (H) 4.0 - 10.5 K/uL   RBC 3.55 (L) 4.22 - 5.81 MIL/uL   Hemoglobin 10.8 (L) 13.0 - 17.0 g/dL   HCT 32.7 (L) 39.0 - 52.0 %   MCV 92.1 80.0 - 100.0 fL   MCH 30.4 26.0 - 34.0 pg   MCHC 33.0 30.0 - 36.0 g/dL   RDW 13.7 11.5 - 15.5 %   Platelets 323 150 - 400 K/uL   nRBC 0.0 0.0 - 0.2 %    Comment: Performed at [facility], [place] [address], [place], [place] [address]  Comprehensive metabolic panel     Status: Abnormal   Collection Time: 03/04/19  5:48 AM  Result Value Ref Range   Sodium 132 (L) 135 - 145 mmol/L   Potassium 4.4 3.5 - 5.1 mmol/L   Chloride 97 (L) 98 - 111 mmol/L   CO2 21 (L) 22 - 32 mmol/L   Glucose, Bld 411 (H) 70 - 99 mg/dL   BUN 24 (H) 8 - 23 mg/dL   Creatinine, Ser 1.39 (H) 0.61 - 1.24 mg/dL   Calcium 8.4 (L) 8.9 - 10.3 mg/dL   Total Protein 5.7 (L) 6.5 - 8.1 g/dL    Albumin 2.9 (L) 3.5 - 5.0 g/dL   AST 13 (L) 15 - 41 U/L   ALT 13 0 - 44 U/L   Alkaline Phosphatase 109 38 - 126 U/L   Total Bilirubin 1.3 (H) 0.3 - 1.2 mg/dL   GFR calc non Af Amer 44 (L) >60 mL/min   GFR calc Af Amer 51 (L) >60 mL/min   Anion gap 14 5 - 15    Comment: Performed at [facility], [place] [address], [place], [place] [address]  Glucose, capillary     Status: Abnormal   Collection Time: 03/04/19  6:07 AM  Result Value Ref Range   Glucose-Capillary 374 (H) 70 - 99 mg/dL    Dg Chest 1 View  Result Date: 03/03/2019 CLINICAL DATA:  Altered mental status. Hyperglycemia. EXAM: CHEST  1 VIEW COMPARISON:  02/26/2017. FINDINGS: Cardiomegaly. Low lung volumes. Bibasilar subsegmental atelectasis. No consolidation or edema. Old rib fractures. No pneumothorax. Osteopenia. IMPRESSION: 1. Cardiomegaly with low lung volumes. Bibasilar subsegmental atelectasis. 2. No consolidation or edema. Electronically Signed   By: Staci Righter M.D.   On: 03/03/2019 20:10   Ct Head Wo Contrast  Result Date: 03/04/2019 CLINICAL DATA:  Followup left-sided extra-axial hemorrhage. EXAM: CT HEAD WITHOUT CONTRAST TECHNIQUE: Contiguous axial images were obtained from the base of the skull through the vertex without intravenous contrast. COMPARISON:  03/03/2019 FINDINGS: Brain: No change since the previous study. Extra-axial hematoma in the left lateral frontotemporal convexity shows maximal thickness of 9 mm compared with 10 mm previously. This is favored to be subdural, though epidural hematoma is not completely excluded. No significant mass effect. No other intracranial hemorrhage evident. Generalized brain atrophy with chronic small-vessel ischemic changes throughout the brain as seen previously. Vascular: There is atherosclerotic calcification of the major vessels at the base of the brain. Skull: No skull fracture or skull base fracture seen. Sinuses/Orbits: Chronic inflammatory opacification of the left  maxillary sinus. Some chronic ethmoid disease, left more than right. Orbits negative. Other: None IMPRESSION: No worsening since yesterday. Extra-axial hematoma in the left  lateral frontotemporal convexity shows maximal thickness of 9 mm today, measured at 10 mm yesterday. No evidence of additional bleeding. No mass effect. This could be either subdural or epidural, but is not enlarging or worsening. Electronically Signed   By: Nelson Chimes M.D.   On: 03/04/2019 09:44   Ct Head Wo Contrast  Result Date: 03/03/2019 CLINICAL DATA:  85 year old male with confusion. EXAM: CT HEAD WITHOUT CONTRAST TECHNIQUE: Contiguous axial images were obtained from the base of the skull through the vertex without intravenous contrast. COMPARISON:  Head CT dated 02/11/2018 FINDINGS: Brain: There is mild to moderate age-related atrophy and chronic microvascular ischemic changes. There is a 3.6 x 1.1 cm limited form high attenuating extra-axial collection along the left temporal lobe which may represent a subdural or epidural hemorrhage. There is minimal associated mass effect on the left temporal lobe. No midline shift. Vascular: No hyperdense vessel or unexpected calcification. Skull: Normal. Negative for fracture or focal lesion. Sinuses/Orbits: There is near complete opacification of the left maxillary sinus with diffuse mucoperiosteal thickening of paranasal sinuses. The mastoid air cells are clear. Other: None IMPRESSION: 1. High attenuating extra-axial collection along the left temporal lobe which may represent a subdural or epidural hemorrhage. Close follow-up with CT recommended. 2. Age-related atrophy and chronic microvascular ischemic changes. 3. Paranasal sinus disease. These results were called by telephone at the time of interpretation on 03/03/2019 at 8:11 pm to Dr. Francine Graven , who verbally acknowledged these results. Electronically Signed   By: Anner Crete M.D.   On: 03/03/2019 20:13    ROS  : As  above Blood pressure (!) 112/53, pulse 84, temperature 98.1 F (36.7 C), temperature source Oral, resp. rate 16, height 5\' 7"  (1.702 m), weight 63.1 kg, SpO2 93 %. Estimated body mass index is 21.79 kg/m as calculated from the following:   Height as of this encounter: 5\' 7"  (1.702 m).   Weight as of this encounter: 63.1 kg.  Physical Exam   General: An alert and pleasant 85 year old white male in bed in no apparent distress.  HEENT:  Normocephalic, atraumatic, extraocular muscles are intact.    Neck: Supple without obvious deformity.  He has an age-appropriate normal range of motion.    Thorax:  Symmetric  Abdomen:  Soft  Extremities: Unremarkable  Back exam: Unremarkable  Neurologic exam:  The patient is alert and oriented x3.  Glasgow coma scale 15. Cranial nerves 2-12 were examined bilaterally and grossly normal except he has press because this and is blind bilaterally.  The patient's motor strength is normal in his bilateral hand grip, biceps, gastrocnemius, and dorsiflexors.  Sensory function is intact to light touch incision at all tested dermatomes bilaterally.  I have reviewed the patient's head CT performed yesterday at [facility]  He has a  stable small acute left subdural hematoma  without significant mass effect  Assessment/Plan:   Left subdural hematoma:  This is small and stable.  He does not need surgery.   From my point of view no further workup , follow-up, or intervention is needed  and he can be discharged to home.   He can follow up with his primary doctor to decide whether future anticoagulation is warranted.  I will sign off.  Please call if I can be of further assistance.  Ophelia Charter 03/04/2019, 11:08 AM

## 2019-03-04 NOTE — ED Notes (Signed)
[facility] here for transport.  

## 2019-03-04 NOTE — Progress Notes (Signed)
Patient arrived in the unit at 0250am , escorted by [facility] staffs, alet and oriented x1-2, seemed little agitated, but VTs are WNL, denied of pain, resting in a bed at this time, all safety and comfort measures are in placed, and will continue to monitor closely.

## 2019-03-04 NOTE — ED Notes (Signed)
Report to Gerald Stabs, [place].

## 2019-03-05 LAB — BASIC METABOLIC PANEL
Anion gap: 15 (ref 5–15)
BUN: 20 mg/dL (ref 8–23)
CO2: 21 mmol/L — ABNORMAL LOW (ref 22–32)
Calcium: 8.6 mg/dL — ABNORMAL LOW (ref 8.9–10.3)
Chloride: 100 mmol/L (ref 98–111)
Creatinine, Ser: 1.31 mg/dL — ABNORMAL HIGH (ref 0.61–1.24)
GFR calc Af Amer: 55 mL/min — ABNORMAL LOW (ref >60)
GFR calc non Af Amer: 47 mL/min — ABNORMAL LOW (ref >60)
Glucose, Bld: 218 mg/dL — ABNORMAL HIGH (ref 70–99)
Potassium: 3.6 mmol/L (ref 3.5–5.1)
Sodium: 136 mmol/L (ref 135–145)

## 2019-03-05 LAB — GLUCOSE, CAPILLARY
Glucose-Capillary: 302 mg/dL — ABNORMAL HIGH (ref 70–99)
Glucose-Capillary: 221 mg/dL — ABNORMAL HIGH (ref 70–99)
Glucose-Capillary: 159 mg/dL — ABNORMAL HIGH (ref 70–99)
Glucose-Capillary: 120 mg/dL — ABNORMAL HIGH (ref 70–99)
Glucose-Capillary: 146 mg/dL — ABNORMAL HIGH (ref 70–99)

## 2019-03-05 LAB — URINE CULTURE: Culture: NO GROWTH

## 2019-03-05 LAB — HEMOGLOBIN AND HEMATOCRIT, BLOOD
HCT: 31.7 % — ABNORMAL LOW (ref 39.0–52.0)
Hemoglobin: 10 g/dL — ABNORMAL LOW (ref 13.0–17.0)

## 2019-03-05 MED ORDER — CLONAZEPAM 0.5 MG PO TABS
0.5000 mg | ORAL_TABLET | Freq: Every day | ORAL | Status: DC | PRN
  Administered 2019-03-05: 09:00:00 0.5 mg via ORAL
  Filled 2019-03-05: qty 1

## 2019-03-05 MED ORDER — TAMSULOSIN HCL 0.4 MG PO CAPS
0.4000 mg | ORAL_CAPSULE | Freq: Every day | ORAL | Status: DC
  Administered 2019-03-06 – 2019-03-09 (×4): 0.4 mg via ORAL
  Filled 2019-03-05 (×4): qty 1

## 2019-03-05 MED ORDER — LORAZEPAM 2 MG/ML IJ SOLN
0.5000 mg | Freq: Once | INTRAMUSCULAR | Status: AC
  Administered 2019-03-05: 02:00:00 0.5 mg via INTRAVENOUS
  Filled 2019-03-05: qty 1

## 2019-03-05 MED ORDER — CLONAZEPAM 0.5 MG PO TABS
0.5000 mg | ORAL_TABLET | Freq: Two times a day (BID) | ORAL | Status: DC | PRN
  Administered 2019-03-06 – 2019-03-08 (×2): 0.5 mg via ORAL
  Filled 2019-03-05 (×2): qty 1

## 2019-03-05 NOTE — NC FL2 (Addendum)
[place] LEVEL OF CARE SCREENING TOOL     IDENTIFICATION  Patient Name: VINSON TIETZE Birthdate: 1926/01/22 Sex: male Admission Date (Current Location): 03/03/2019  [facility] and [place] Number:  [employer] and Address:  [facility], [place] [address], [place], [place] [address]      Provider Number: [id number]  Attending Physician Name and Address:  Cristal Ford, DO  Relative Name and Phone Number:       Current Level of Care: Hospital Recommended Level of Care: [place] Prior Approval Number:    Date Approved/Denied:   PASRR Number: [id number]  Discharge Plan: SNF    Current Diagnoses: Patient Active Problem List   Diagnosis Date Noted  . Subdural hemorrhage ([place]) 03/03/2019  . Klebsiella sepsis ([place]) 03/01/2017  . Closed fracture of left proximal tibia 02/28/2017  . Fracture of left proximal fibula 02/28/2017  . 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 Acute metabolic encephalopathy 51/88/4166  . Sepsis due to undetermined organism ([place]) 02/27/2017  . CKD stage 3 due to type 2 diabetes mellitus ([place]) 02/27/2017  . Urinary tract infection associated with indwelling urethral catheter ([place])   . Sepsis ([place]) 02/26/2017  . Pressure injury of skin 02/26/2017  . Acute encephalopathy 02/26/2017  . Atrial fibrillation ([place]) 02/26/2017  . History of pulmonary embolism./ DVT 02/19/2017  . Fracture, femur ([place]) 02/19/2017  . Type 1 diabetes mellitus with hyperglycemia ([place])   . Hypercholesterolemia 01/29/2017  . Diabetic peripheral neuropathy ([place]) 12/11/2014  . Chronic upper back pain 12/11/2014  . Osteoporosis, unspecified 06/23/2014  . Chronic anticoagulation 05/26/2013  . Acute DVT (deep venous thrombosis) ([place]) 05/09/2013  . Left leg DVT ([place]) 05/07/2013  . Hypertension   . Intertrochanteric fracture of right femur ([place]) 12/09/2011  . ARF (acute  renal failure) ([place]) 10/29/2011  . Leukocytosis 10/29/2011  . Normocytic anemia 10/29/2011  . Diabetic gastroparesis ([place]) 10/29/2011  . Weight loss, abnormal 10/29/2011    Orientation RESPIRATION BLADDER Height & Weight     Self  Normal Incontinent Weight: 139 lb 1.8 oz (63.1 kg) Height:  5\' 7"  (170.2 cm)  BEHAVIORAL SYMPTOMS/MOOD NEUROLOGICAL BOWEL NUTRITION STATUS      Incontinent Diet(see DC summary)  AMBULATORY STATUS COMMUNICATION OF NEEDS Skin   Extensive Assist Verbally Normal                       Personal Care Assistance Level of Assistance  Bathing, Dressing, Feeding Bathing Assistance: Limited assistance Feeding assistance: Limited assistance Dressing Assistance: Limited assistance     Functional Limitations Info  Sight, Hearing, Speech Sight Info: Adequate Hearing Info: Adequate Speech Info: Adequate    SPECIAL CARE FACTORS FREQUENCY  PT (By licensed PT), OT (By licensed OT)     PT Frequency: 5x/wk OT Frequency: 5x/wk            Contractures Contractures Info: Not present    Additional Factors Info  Code Status, Allergies, Psychotropic, Insulin Sliding Scale Code Status Info: DNR Allergies Info: NKA Psychotropic Info: Celexa 20mg  daily at 10am; Klonopin 0.5mg  daily PRN Insulin Sliding Scale Info: 0-9 units 3x/day with meals; Levemir 5 units daily       Current Medications (03/05/2019):  This is the current hospital active medication list Current Facility-Administered Medications  Medication Dose Route Frequency Provider Last Rate Last Dose  . 0.9 %  sodium chloride infusion   Intravenous Continuous [place], Gagan S,  MD 75 mL/hr at 03/04/19 1206    . acetaminophen (TYLENOL) tablet 650 mg  650 mg Oral Q6H PRN Oswald Hillock, MD   650 mg at 03/05/19 0200   Or  . acetaminophen (TYLENOL) suppository 650 mg  650 mg Rectal Q6H PRN Oswald Hillock, MD      . citalopram (CELEXA) tablet 20 mg  20 mg Oral q morning - 10a Darrick Meigs, Marge Duncans, MD      . clonazePAM  Bobbye [place]) tablet 0.5 mg  0.5 mg Oral Daily PRN Cristal Ford, DO      . insulin aspart (novoLOG) injection 0-9 Units  0-9 Units Subcutaneous TID WC Oswald Hillock, MD   7 Units at 03/05/19 [phone]  . insulin detemir (LEVEMIR) injection 5 Units  5 Units Subcutaneous Daily Cristal Ford, DO   5 Units at 03/04/19 1235  . latanoprost (XALATAN) 0.005 % ophthalmic solution 1 drop  1 drop Both Eyes QHS Oswald Hillock, MD   1 drop at 03/04/19 2240  . ondansetron (ZOFRAN) tablet 4 mg  4 mg Oral Q6H PRN Oswald Hillock, MD       Or  . ondansetron (ZOFRAN) injection 4 mg  4 mg Intravenous Q6H PRN Oswald Hillock, MD         Discharge Medications: Please see discharge summary for a list of discharge medications.  Relevant Imaging Results:  Relevant Lab Results:   Additional Information SS#: [id number]  Geralynn Ochs, LCSW

## 2019-03-05 NOTE — Progress Notes (Signed)
Occupational Therapy Evaluation Patient Details Name: Eduardo Lee MRN: [id number] DOB: March 04, 1926 Today's Date: 03/05/2019    History of Present Illness Pt is a 85 y.o. M with significant PMH of DVT, hypertension, legally blind, dementia who was brought in for generalized weakness and confusion. Head CT showing left subdural hematoma.    Clinical Impression   PTA, pt living with family who were caring for pt. During assessement, pt appears to be hallucinating, reaching for things in the air and then "putting snuff" in his mouth. Pt required Max A with sit - stand and total A with ADL. Pt will need rehab at SNF. Will follow acutely. Nursing notified of skin tears and came to room to address.     Follow Up Recommendations  SNF;Supervision/Assistance - 24 hour    Equipment Recommendations  Other (comment)(TBA at SNF)    Recommendations for Other Services       Precautions / Restrictions Precautions Precautions: Fall Precaution Comments: Pt is blind Restrictions Weight Bearing Restrictions: No      Mobility Bed Mobility Overal bed mobility: Needs Assistance Bed Mobility: Supine to Sit;Sit to Supine     Supine to sit: Mod assist Sit to supine: Mod assist   General bed mobility comments: pending cognitive status  Transfers Overall transfer level: Needs assistance Equipment used: 1 person hand held assist Transfers: Sit to/from Stand;Stand Pivot Transfers Sit to Stand: Max assist Stand pivot transfers: Mod assist            Balance Overall balance assessment: Needs assistance Sitting-balance support: Feet supported Sitting balance-Leahy Scale: Poor     Standing balance support: Bilateral upper extremity supported Standing balance-Leahy Scale: Poor Standing balance comment: reliant on external support                           ADL either performed or assessed with clinical judgement   ADL Overall ADL's : Needs assistance/impaired                                        General ADL Comments: total A at this time; Pt hallucinating     Vision Baseline Vision/History: Legally blind       Perception     Praxis      Pertinent Vitals/Pain Pain Assessment: Faces Faces Pain Scale: No hurt     Hand Dominance     Extremity/Trunk Assessment Upper Extremity Assessment Upper Extremity Assessment: Generalized weakness   Lower Extremity Assessment Lower Extremity Assessment: Defer to PT evaluation   Cervical / Trunk Assessment Cervical / Trunk Assessment: Kyphotic;Other exceptions Cervical / Trunk Exceptions: R bias at times; pushes posteriorly at times   Communication Communication Communication: [facility]   Cognition Arousal/Alertness: Awake/alert Behavior During Therapy: WFL for tasks assessed/performed Overall Cognitive Status: No family/caregiver present to determine baseline cognitive functioning                                 General Comments: history of dementia; pt attempting to crawl out of bed. Has been medicated as well as mittnes used   General Comments       Exercises     Shoulder Instructions      Home Living Family/patient expects to be discharged to:: Skilled nursing facility Living Arrangements: Children(son, daughter in law) Available Help at  Discharge: Family Type of Home: House Home Access: Stairs to enter Technical brewer of Steps: 2   Home Layout: One level               Home Equipment: Wheelchair - manual          Prior Functioning/Environment Level of Independence: Needs assistance  Gait / Transfers Assistance Needed: pt son assists with stand pivot transfer into wheelchair  ADL's / Homemaking Assistance Needed: pt son assists with all ADL's            OT Problem List: Decreased strength;Decreased activity tolerance;Impaired balance (sitting and/or standing);Impaired vision/perception;Decreased coordination;Decreased cognition;Decreased  safety awareness;Decreased knowledge of use of DME or AE;Cardiopulmonary status limiting activity      OT Treatment/Interventions: Self-care/ADL training;Therapeutic exercise;DME and/or AE instruction;Therapeutic activities;Cognitive remediation/compensation;Visual/perceptual remediation/compensation;Patient/family education;Balance training    OT Goals(Current goals can be found in the care plan section) Acute Rehab OT Goals Patient Stated Goal: none stated OT Goal Formulation: With patient Time For Goal Achievement: 03/19/19 Potential to Achieve Goals: Good  OT Frequency: Min 2X/week   Barriers to D/C:            Co-evaluation              AM-PAC OT "6 Clicks" Daily Activity     Outcome Measure Help from another person eating meals?: Total Help from another person taking care of personal grooming?: Total Help from another person toileting, which includes using toliet, bedpan, or urinal?: Total Help from another person bathing (including washing, rinsing, drying)?: Total Help from another person to put on and taking off regular upper body clothing?: Total Help from another person to put on and taking off regular lower body clothing?: Total 6 Click Score: 6   End of Session Equipment Utilized During Treatment: Gait belt Nurse Communication: Mobility status  Activity Tolerance: Patient tolerated treatment well Patient left: in bed;with call bell/phone within reach;with bed alarm set;with SCD's reapplied;with restraints reapplied;with nursing/sitter in room  OT Visit Diagnosis: Other abnormalities of gait and mobility (R26.89);Other symptoms and signs involving cognitive function;Muscle weakness (generalized) (M62.81)                Time: 8546-2703 OT Time Calculation (min): 25 min Charges:  OT General Charges $OT Visit: 1 Visit OT Evaluation $OT Eval Moderate Complexity: 1 Mod OT Treatments $Self Care/Home Management : 8-22 mins  Maurie Boettcher, OT/L   Acute OT  Clinical Specialist [place] Pager [phone] Office [phone]   [facility] 03/05/2019, 1:14 PM

## 2019-03-05 NOTE — Plan of Care (Signed)
  Problem: Health Behavior/Discharge Planning: Goal: Ability to manage health-related needs will improve 03/05/2019 0439 by Marcos Eke, RN Outcome: Not Progressing

## 2019-03-05 NOTE — Care Management Important Message (Signed)
Important Message  Patient Details  Name: Eduardo Lee MRN: [id number] Date of Birth: June 09, 1926   Medicare Important Message Given:  Yes    Orbie Pyo 03/05/2019, 3:36 PM

## 2019-03-05 NOTE — Progress Notes (Signed)
Pt  With increase confusion ,impulsive and  getting oob   Eduardo Lee  Has been reoriented and redirected several instances with no effect.   Ativan not effective as ordered. Safety sitter implemented for close monitoring  of pt  To prevent  falls and injury.. Had loose  BM x 2 . Care rendered and pt made comfortably in bed.  BP slightly high. 162/68 but pt agitated. RN will continue to monitor pt.

## 2019-03-05 NOTE — Progress Notes (Signed)
PROGRESS NOTE    Eduardo Lee  QJJ:[id number] DOB: June 25, 1926 DOA: 03/03/2019 PCP: Mikey Kirschner, MD   Brief Narrative:  HPI On 03/03/2019 by Dr. Eleonore Chiquito Eduardo Lee  is a 85 y.o. 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  As per ED notes family stated that patient usually could stand to pivot to transfer but was unable to do that today.  Also had high blood sugar. No other history is obtainable, patient is a poor historian due to underlying dementia and confusion. In the ED, CT head showed subdural or epidural hemorrhage along the left temporal lobe.  Neurosurgery Dr. Arnoldo Morale was consulted by Dr. Thurnell Garbe, Who recommended K Centra to reverse the effects of Xarelto.  Also if family not keen for surgery patient could stay at [facility] hospital.  Interim history Patient admitted with subdural versus epidural hemorrhage.  Neurosurgery consulted, no surgery needed.  Currently pending SNF placement. Assessment & Plan   Subdural vs epidural hemorrhage  -Noted on CT head: High attenuating extra-axial collection along the left temporal lobe may represent subdural or epidural hemorrhage -Repeat CT head today since yesterday.  Left lateral frontotemporal convexity shows maximal thickness of 9 mm, 10 mm previously -Neurosurgery consulted and appreciated, Dr. Cherrie Distance surgery needed at this time, or further work-up.  Patient can be discharged home and follow-up with his primary care doctor in the future for further anticoagulation. -Per H&P, family does not wish for brain surgery but open to brain whole procedure to drain bleed or hematoma -Continue to monitor -PT recommending SNF  History of DVT/PE -Patient was on Xarelto which is now currently held.  Patient was given Kcentra for reversal  Diabetes mellitus, type 1 -Diabetes  coordinator consulted and appreciated, commended adding 5 units of Levemir -Continue insulin sliding scale with CBG monitoring  History of deafness and blindness -Stable  Depression/anxiety -Continue Celexa -will restart Klonopin  Chronic kidney disease, stage III -creatinine stable, creatinine approximate 0.9-1.2  Normocytic Anemia -Suspect secondary to the above, will continue to monitor CBC  DVT Prophylaxis  SCDs  Code Status: DNR  Family Communication: None at bedside  Disposition Plan: Admitted.  Need SNF placement  Consultants Neurosurgery  Procedures  None  Antibiotics   Anti-infectives (From admission, onward)   None      Subjective:   Eduardo Lee seen and examined today.  Patient with history of dementia and legally blind.  Objective:   Vitals:   03/04/19 2019 03/05/19 0000 03/05/19 0457 03/05/19 0809  BP: (!) 155/82 (!) 162/68 (!) 150/63 (!) 123/56  Pulse: 84 84 88 99  Resp: 17 18 17 18   Temp: 97.9 F (36.6 C) 97.8 F (36.6 C) 97.8 F (36.6 C) 97.8 F (36.6 C)  TempSrc: Oral Oral Oral Axillary  SpO2: 98% 99% 93% 96%  Weight:    63.1 kg  Height:    5\' 7"  (1.702 m)    Intake/Output Summary (Last 24 hours) at 03/05/2019 1158 Last data filed at 03/05/2019 1100 Gross per 24 hour  Intake 1515 ml  Output 400 ml  Net 1115 ml   Filed Weights   03/03/19 1915 03/04/19 0259 03/05/19 0809  Weight: 63.5 kg 63.1 kg 63.1 kg   Exam  General: Well developed, elderly, chronically ill-appearing, NAD  HEENT: NCAT, mucous membranes moist.   Neck: Supple  Cardiovascular: S1 S2 auscultated, RRR  Respiratory: Clear to auscultation bilaterally  Abdomen: Soft, nontender, nondistended, + bowel sounds  Extremities: warm dry without cyanosis clubbing or edema  Neuro: Awake.  Cannot assess orientation due to blindness and difficulty hearing.  Moving all extremities.   Data Reviewed: I have personally reviewed following labs and imaging  studies  CBC: Recent Labs  Lab 03/03/19 2046 03/04/19 0548 03/05/19 0302  WBC 15.9* 11.9*  --   NEUTROABS 13.6*  --   --   HGB 12.1* 10.8* 10.0*  HCT 37.7* 32.7* 31.7*  MCV 92.2 92.1  --   PLT 336 323  --    Basic Metabolic Panel: Recent Labs  Lab 03/03/19 2046 03/04/19 0548 03/05/19 0302  NA 133* 132* 136  K 3.7 4.4 3.6  CL 97* 97* 100  CO2 24 21* 21*  GLUCOSE 307* 411* 218*  BUN 26* 24* 20  CREATININE 1.31* 1.39* 1.31*  CALCIUM 8.6* 8.4* 8.6*   GFR: Estimated Creatinine Clearance: 32.8 mL/min (A) (by C-G formula based on SCr of 1.31 mg/dL (H)). Liver Function Tests: Recent Labs  Lab 03/03/19 2046 03/04/19 0548  AST 15 13*  ALT 13 13  ALKPHOS 120 109  BILITOT 0.8 1.3*  PROT 6.5 5.7*  ALBUMIN 3.5 2.9*   No results for input(s): LIPASE, AMYLASE in the last 168 hours. No results for input(s): AMMONIA in the last 168 hours. Coagulation Profile: No results for input(s): INR, PROTIME in the last 168 hours. Cardiac Enzymes: Recent Labs  Lab 03/03/19 2046  TROPONINI <0.03   BNP (last 3 results) No results for input(s): PROBNP in the last 8760 hours. HbA1C: Recent Labs    03/04/19 0548  HGBA1C 7.8*   CBG: Recent Labs  Lab 03/04/19 1754 03/04/19 2104 03/05/19 0610 03/05/19 0915 03/05/19 1139  GLUCAP 170* 92 302* 221* 159*   Lipid Profile: No results for input(s): CHOL, HDL, LDLCALC, TRIG, CHOLHDL, LDLDIRECT in the last 72 hours. Thyroid Function Tests: No results for input(s): TSH, T4TOTAL, FREET4, T3FREE, THYROIDAB in the last 72 hours. Anemia Panel: No results for input(s): VITAMINB12, FOLATE, FERRITIN, TIBC, IRON, RETICCTPCT in the last 72 hours. Urine analysis:    Component Value Date/Time   COLORURINE YELLOW 03/03/2019 2034   APPEARANCEUR CLEAR 03/03/2019 2034   LABSPEC 1.025 03/03/2019 2034   PHURINE 5.5 03/03/2019 2034   GLUCOSEU >=500 (A) 03/03/2019 2034   HGBUR NEGATIVE 03/03/2019 2034   BILIRUBINUR SMALL (A) 03/03/2019 2034    KETONESUR 15 (A) 03/03/2019 2034   PROTEINUR NEGATIVE 03/03/2019 2034   UROBILINOGEN 0.2 10/29/2011 0555   NITRITE NEGATIVE 03/03/2019 2034   LEUKOCYTESUR NEGATIVE 03/03/2019 2034   Sepsis Labs: @LABRCNTIP (procalcitonin:4,lacticidven:4)  ) Recent Results (from the past 240 hour(s))  Urine culture     Status: None   Collection Time: 03/03/19  8:34 PM  Result Value Ref Range Status   Specimen Description   Final    URINE, CATHETERIZED Performed at [facility], [address], [place], [place] [address]    Special Requests   Final    NONE Performed at [facility], [address] [place], [place] [address]    Culture   Final    NO GROWTH Performed at [facility], [place] [address], [place],  [address]    Report Status 03/05/2019 FINAL  Final      Radiology Studies: Dg Chest 1 View  Result Date: 03/03/2019 CLINICAL DATA:  Altered mental status. Hyperglycemia. EXAM: CHEST  1 VIEW COMPARISON:  02/26/2017. FINDINGS: Cardiomegaly. Low lung  volumes. Bibasilar subsegmental atelectasis. No consolidation or edema. Old rib fractures. No pneumothorax. Osteopenia. IMPRESSION: 1. Cardiomegaly with low lung volumes. Bibasilar subsegmental atelectasis. 2. No consolidation or edema. Electronically Signed   By: Staci Righter M.D.   On: 03/03/2019 20:10   Ct Head Wo Contrast  Result Date: 03/04/2019 CLINICAL DATA:  Followup left-sided extra-axial hemorrhage. EXAM: CT HEAD WITHOUT CONTRAST TECHNIQUE: Contiguous axial images were obtained from the base of the skull through the vertex without intravenous contrast. COMPARISON:  03/03/2019 FINDINGS: Brain: No change since the previous study. Extra-axial hematoma in the left lateral frontotemporal convexity shows maximal thickness of 9 mm compared with 10 mm previously. This is favored to be subdural, though epidural hematoma is not completely excluded. No significant mass effect. No other intracranial hemorrhage evident. Generalized brain atrophy  with chronic small-vessel ischemic changes throughout the brain as seen previously. Vascular: There is atherosclerotic calcification of the major vessels at the base of the brain. Skull: No skull fracture or skull base fracture seen. Sinuses/Orbits: Chronic inflammatory opacification of the left maxillary sinus. Some chronic ethmoid disease, left more than right. Orbits negative. Other: None IMPRESSION: No worsening since yesterday. Extra-axial hematoma in the left lateral frontotemporal convexity shows maximal thickness of 9 mm today, measured at 10 mm yesterday. No evidence of additional bleeding. No mass effect. This could be either subdural or epidural, but is not enlarging or worsening. Electronically Signed   By: Nelson Chimes M.D.   On: 03/04/2019 09:44   Ct Head Wo Contrast  Result Date: 03/03/2019 CLINICAL DATA:  85 year old male with confusion. EXAM: CT HEAD WITHOUT CONTRAST TECHNIQUE: Contiguous axial images were obtained from the base of the skull through the vertex without intravenous contrast. COMPARISON:  Head CT dated 02/11/2018 FINDINGS: Brain: There is mild to moderate age-related atrophy and chronic microvascular ischemic changes. There is a 3.6 x 1.1 cm limited form high attenuating extra-axial collection along the left temporal lobe which may represent a subdural or epidural hemorrhage. There is minimal associated mass effect on the left temporal lobe. No midline shift. Vascular: No hyperdense vessel or unexpected calcification. Skull: Normal. Negative for fracture or focal lesion. Sinuses/Orbits: There is near complete opacification of the left maxillary sinus with diffuse mucoperiosteal thickening of paranasal sinuses. The mastoid air cells are clear. Other: None IMPRESSION: 1. High attenuating extra-axial collection along the left temporal lobe which may represent a subdural or epidural hemorrhage. Close follow-up with CT recommended. 2. Age-related atrophy and chronic microvascular  ischemic changes. 3. Paranasal sinus disease. These results were called by telephone at the time of interpretation on 03/03/2019 at 8:11 pm to Dr. Francine Graven , who verbally acknowledged these results. Electronically Signed   By: Anner Crete M.D.   On: 03/03/2019 20:13     Scheduled Meds:  citalopram  20 mg Oral q morning - 10a   insulin aspart  0-9 Units Subcutaneous TID WC   insulin detemir  5 Units Subcutaneous Daily   latanoprost  1 drop Both Eyes QHS   Continuous Infusions:  sodium chloride 75 mL/hr at 03/05/19 0931     LOS: 2 days   Time Spent in minutes   30 minutes  Ashayla Subia D.O. on 03/05/2019 at 11:58 AM  Between 7am to 7pm - Please see pager noted on [email or web address]  After 7pm go to [email or web address]  And look for the night coverage person covering for me after hours  [facility] Office  [phone]

## 2019-03-05 NOTE — Progress Notes (Addendum)
Pt is becoming more confused and trying to get oob reoriented and redirected  Follows command but as soon as nursing staff leaves the room pt tries to climb OOB.   On call medical personnel notify & awaiting  a return call . Meanwhile RN will  Monitor pt closely.

## 2019-03-05 NOTE — TOC Initial Note (Signed)
Transition of Care [facility]) - Initial/Assessment Note    Patient Details  Name: Eduardo Lee MRN: [id number] Date of Birth: 08/25/1926  Transition of Care [facility]) CM/SW Contact:    Geralynn Ochs, LCSW Phone Number: 03/05/2019, 3:51 PM  Clinical Narrative:  Patient son agreeable to SNF at discharge, concerned about whether or not he'll be able to take the patient home or if he'll need long term care after rehab. Son hopeful for [facility] at [facility], but agreeable to other facilities in the area. CSW provided CMS list to patient's son over the phone. Son hopeful that [facility] will change their mind, but also ok with [facility] or [facility] as other options. CSW reached out to [facility], and they are currently saying no because of the patient's increased confusion. Should patient remain over the weekend and be improved with his cognition, then they will reassess on Monday. Bed offer still pending at [facility]; CSW left voicemail to discuss. [facility] has offered a bed for the patient.                  Expected Discharge Plan: Skilled Nursing Facility Barriers to Discharge: Continued Medical Work up, Requiring sitter/restraints   Patient Goals and CMS Choice Patient states their goals for this hospitalization and ongoing recovery are:: patient unable to participate in goal setting CMS Medicare.gov Compare Post Acute Care list provided to:: Patient Represenative (must comment) Choice offered to / list presented to : Adult Children  Expected Discharge Plan and Services Expected Discharge Plan: [place] arrangements for the past 2 months: Single Family Home                          Prior Living Arrangements/Services Living arrangements for the past 2 months: Single Family Home Lives with:: Self, Adult Children Patient language and need for interpreter reviewed:: No Do you feel safe going back to the place where you  live?: Yes      Need for Family Participation in Patient Care: Yes (Comment) Care giver support system in place?: No (comment)   Criminal Activity/Legal Involvement Pertinent to Current Situation/Hospitalization: No - Comment as needed  Activities of Daily Living      Permission Sought/Granted Permission sought to share information with : Facility Sport and exercise psychologist, Family Supports Permission granted to share information with : Yes, Verbal Permission Granted  Share Information with NAME: [name]  Permission granted to share info w AGENCY: SNF  Permission granted to share info w Relationship: Son     Emotional Assessment Appearance:: Appears stated age Attitude/Demeanor/Rapport: Unable to Assess Affect (typically observed): Unable to Assess Orientation: : Oriented to Self Alcohol / Substance Use: Not Applicable Psych Involvement: No (comment)  Admission diagnosis:  Intracranial hematoma, left, without loss of consciousness, initial encounter [facility]) [S06.350A] Patient Active Problem List   Diagnosis Date Noted  . Subdural hemorrhage ([place]) 03/03/2019  . Klebsiella sepsis ([place]) 03/01/2017  . Closed fracture of left proximal tibia 02/28/2017  . Fracture of left proximal fibula 02/28/2017  . Poorly controlled type 2 diabetes mellitus with autonomic neuropathy ([place]) 02/27/2017  . Acute metabolic encephalopathy 99/35/7017  . Sepsis due to undetermined organism ([place]) 02/27/2017  . CKD stage 3 due to type 2 diabetes mellitus ([place]) 02/27/2017  . Urinary tract infection associated with indwelling urethral catheter ([place])   . Sepsis ([place]) 02/26/2017  . Pressure injury of skin  02/26/2017  . Acute encephalopathy 02/26/2017  . Atrial fibrillation ([place]) 02/26/2017  . History of pulmonary embolism./ DVT 02/19/2017  . Fracture, femur ([place]) 02/19/2017  . Type 1 diabetes mellitus with hyperglycemia ([place])   . Hypercholesterolemia 01/29/2017  . Diabetic peripheral neuropathy ([place]) 12/11/2014   . Chronic upper back pain 12/11/2014  . Osteoporosis, unspecified 06/23/2014  . Chronic anticoagulation 05/26/2013  . Acute DVT (deep venous thrombosis) ([place]) 05/09/2013  . Left leg DVT ([place]) 05/07/2013  . Hypertension   . Intertrochanteric fracture of right femur ([place]) 12/09/2011  . ARF (acute renal failure) ([place]) 10/29/2011  . Leukocytosis 10/29/2011  . Normocytic anemia 10/29/2011  . Diabetic gastroparesis ([place]) 10/29/2011  . Weight loss, abnormal 10/29/2011   PCP:  Mikey Kirschner, MD Pharmacy:   [place], [address] [place] [address] Phone: [phone] Fax: [phone]  [place], [place] [address] Phone: [phone] Fax: [place], [address] AT [place] [address] Phone: [phone] Fax: [phone]  [facility] [place] [facility] [place], [address] [address] [place] [address] Phone: [phone] Fax: [place], [place] [address] AT [place] [place] [address] Phone: [phone] Fax: [phone]     Social Determinants of Health (SDOH) Interventions    Readmission Risk Interventions No flowsheet data found.

## 2019-03-05 NOTE — Progress Notes (Signed)
Pt obtained new skin tear while trying to climb out of the bed, safety attendant present, skin tear treated with steri-strips and tegaderm,  on L anterior forearm.   Eduardo Lee

## 2019-03-05 NOTE — Evaluation (Signed)
Physical Therapy Evaluation Patient Details Name: Eduardo Lee MRN: [id number] DOB: [birth year] Today's Date: 03/05/2019   History of Present Illness  Pt is a 85 y.o. M with significant PMH of DVT, hypertension, legally blind, dementia who was brought in for generalized weakness and confusion. Head CT showing left subdural hematoma.   Clinical Impression  Pt with slow progression towards goals. Pt very resistive to therapy this session with heavy posterior lean. Required max-total A +2 to perform bed mobility tasks this session. Current recommendations appropriate. Will continue to follow acutely to maximize functional mobility independence and safety.     Follow Up Recommendations SNF;Supervision/Assistance - 24 hour    Equipment Recommendations  None recommended by PT    Recommendations for Other Services       Precautions / Restrictions Precautions Precautions: Fall Precaution Comments: Pt is blind Restrictions Weight Bearing Restrictions: No      Mobility  Bed Mobility Overal bed mobility: Needs Assistance Bed Mobility: Supine to Sit;Sit to Supine     Supine to sit: Max assist;Total assist;+2 for physical assistance Sit to supine: Max assist;Total assist;+2 for physical assistance   General bed mobility comments: Pt requiring max-total A +2 to perform bed mobility tasks. Pt very resistive to bed mobility and pt with heavy posterior lean in sitting. Unsafe to attempt further mobilit.   Transfers Overall transfer level: Needs assistance Equipment used: 1 person hand held assist Transfers: Sit to/from [facility] Sit to Stand: Max assist Stand pivot transfers: Mod assist          Ambulation/Gait                Stairs            Wheelchair Mobility    Modified Rankin (Stroke Patients Only)       Balance Overall balance assessment: Needs assistance Sitting-balance support: Feet supported;Bilateral upper extremity  supported Sitting balance-Leahy Scale: Zero Sitting balance - Comments: Heavy posterior lean in sitting.  Postural control: Posterior lean Standing balance support: Bilateral upper extremity supported Standing balance-Leahy Scale: Poor Standing balance comment: reliant on external support                             Pertinent Vitals/Pain Pain Assessment: Faces Faces Pain Scale: No hurt    Home Living Family/patient expects to be discharged to:: Skilled nursing facility Living Arrangements: Children(son, daughter in law) Available Help at Discharge: Family Type of Home: House Home Access: Stairs to enter   Technical brewer of Steps: 2 Home Layout: One level Home Equipment: Wheelchair - manual      Prior Function Level of Independence: Needs assistance   Gait / Transfers Assistance Needed: pt son assists with stand pivot transfer into wheelchair   ADL's / Homemaking Assistance Needed: pt son assists with all ADL's        Hand Dominance        Extremity/Trunk Assessment   Upper Extremity Assessment Upper Extremity Assessment: Generalized weakness    Lower Extremity Assessment Lower Extremity Assessment: Defer to PT evaluation    Cervical / Trunk Assessment Cervical / Trunk Assessment: Kyphotic;Other exceptions Cervical / Trunk Exceptions: R bias at times; pushes posteriorly at times  Communication   Communication: [facility]  Cognition Arousal/Alertness: Awake/alert Behavior During Therapy: Restless Overall Cognitive Status: No family/caregiver present to determine baseline cognitive functioning  General Comments: Pt very restless during session. Hx of dementia      General Comments      Exercises     Assessment/Plan    PT Assessment    PT Problem List         PT Treatment Interventions      PT Goals (Current goals can be found in the Care Plan section)  Acute Rehab PT Goals Patient  Stated Goal: none stated PT Goal Formulation: With patient/family Time For Goal Achievement: 03/18/19 Potential to Achieve Goals: Fair    Frequency Min 3X/week   Barriers to discharge        Co-evaluation               AM-PAC PT "6 Clicks" Mobility  Outcome Measure Help needed turning from your back to your side while in a flat bed without using bedrails?: Total Help needed moving from lying on your back to sitting on the side of a flat bed without using bedrails?: Total Help needed moving to and from a bed to a chair (including a wheelchair)?: Total Help needed standing up from a chair using your arms (e.g., wheelchair or bedside chair)?: Total Help needed to walk in hospital room?: Total Help needed climbing 3-5 steps with a railing? : Total 6 Click Score: 6    End of Session   Activity Tolerance: Patient tolerated treatment well Patient left: in bed;with call bell/phone within reach;with bed alarm set;with nursing/sitter in room Nurse Communication: Mobility status PT Visit Diagnosis: Unsteadiness on feet (R26.81);Other abnormalities of gait and mobility (R26.89);Muscle weakness (generalized) (M62.81)    Time: 3335-4562 PT Time Calculation (min) (ACUTE ONLY): 11 min   Charges:     PT Treatments $Therapeutic Activity: 8-22 mins        Leighton Ruff, PT, DPT  Acute Rehabilitation Services  Pager: [phone] Office: [phone]   Rudean Hitt 03/05/2019, 4:30 PM

## 2019-03-06 LAB — GLUCOSE, CAPILLARY
Glucose-Capillary: 320 mg/dL — ABNORMAL HIGH (ref 70–99)
Glucose-Capillary: 317 mg/dL — ABNORMAL HIGH (ref 70–99)
Glucose-Capillary: 211 mg/dL — ABNORMAL HIGH (ref 70–99)
Glucose-Capillary: 191 mg/dL — ABNORMAL HIGH (ref 70–99)
Glucose-Capillary: 176 mg/dL — ABNORMAL HIGH (ref 70–99)

## 2019-03-06 LAB — BASIC METABOLIC PANEL
Anion gap: 13 (ref 5–15)
BUN: 18 mg/dL (ref 8–23)
CO2: 21 mmol/L — ABNORMAL LOW (ref 22–32)
Calcium: 8.5 mg/dL — ABNORMAL LOW (ref 8.9–10.3)
Chloride: 102 mmol/L (ref 98–111)
Creatinine, Ser: 1.28 mg/dL — ABNORMAL HIGH (ref 0.61–1.24)
GFR calc Af Amer: 56 mL/min — ABNORMAL LOW (ref >60)
GFR calc non Af Amer: 49 mL/min — ABNORMAL LOW (ref >60)
Glucose, Bld: 276 mg/dL — ABNORMAL HIGH (ref 70–99)
Potassium: 3.4 mmol/L — ABNORMAL LOW (ref 3.5–5.1)
Sodium: 136 mmol/L (ref 135–145)

## 2019-03-06 LAB — HEMOGLOBIN AND HEMATOCRIT, BLOOD
HCT: 31 % — ABNORMAL LOW (ref 39.0–52.0)
Hemoglobin: 9.9 g/dL — ABNORMAL LOW (ref 13.0–17.0)

## 2019-03-06 MED ORDER — HALOPERIDOL LACTATE 5 MG/ML IJ SOLN
5.0000 mg | Freq: Once | INTRAMUSCULAR | Status: AC
  Administered 2019-03-06: 04:00:00 5 mg via INTRAVENOUS
  Filled 2019-03-06: qty 1

## 2019-03-06 MED ORDER — POTASSIUM CHLORIDE 20 MEQ/15ML (10%) PO SOLN
40.0000 meq | Freq: Every day | ORAL | Status: DC
  Administered 2019-03-06 – 2019-03-08 (×3): 40 meq via ORAL
  Administered 2019-03-09: 09:00:00 20 meq via ORAL
  Filled 2019-03-06 (×4): qty 30

## 2019-03-06 NOTE — Progress Notes (Signed)
Restless. Trying to get of bed frequently. Oriented to self.  NP ordered Haldol IV.

## 2019-03-06 NOTE — Progress Notes (Signed)
PROGRESS NOTE    CHRISTIE COPLEY  QMG:[id number] DOB: 03-Aug-1926 DOA: 03/03/2019 PCP: Mikey Kirschner, MD   Brief Narrative:  HPI On 03/03/2019 by Dr. Eleonore Chiquito Raymone Pembroke  is a 85 y.o. 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  As per ED notes family stated that patient usually could stand to pivot to transfer but was unable to do that today.  Also had high blood sugar. No other history is obtainable, patient is a poor historian due to underlying dementia and confusion. In the ED, CT head showed subdural or epidural hemorrhage along the left temporal lobe.  Neurosurgery Dr. Arnoldo Morale was consulted by Dr. Thurnell Garbe, Who recommended K Centra to reverse the effects of Xarelto.  Also if family not keen for surgery patient could stay at [facility] hospital.  Interim history Patient admitted with subdural versus epidural hemorrhage.  Neurosurgery consulted, no surgery needed.  Currently pending SNF placement. Assessment & Plan   Subdural vs epidural hemorrhage  -Noted on CT head: High attenuating extra-axial collection along the left temporal lobe may represent subdural or epidural hemorrhage -Repeat CT head today since yesterday.  Left lateral frontotemporal convexity shows maximal thickness of 9 mm, 10 mm previously -Neurosurgery consulted and appreciated, Dr. Cherrie Distance surgery needed at this time, or further work-up.  Patient can be discharged home and follow-up with his primary care doctor in the future for further anticoagulation. -Per H&P, family does not wish for brain surgery but open to brain whole procedure to drain bleed or hematoma -Continue to monitor -PT recommending SNF  History of DVT/PE -Patient was on Xarelto which is now currently held.  Patient was given Kcentra for reversal  Diabetes mellitus, type 1 -Diabetes  coordinator consulted and appreciated, commended adding 5 units of Levemir -Continue insulin sliding scale with CBG monitoring  History of deafness and blindness -Stable  Depression/anxiety -Continue Celexa -restarted Klonopin and increased dose  Chronic kidney disease, stage III -creatinine stable, creatinine approximate 0.9-1.2  Normocytic Anemia -Suspect secondary to the above, will continue to monitor CBC -hemoglobin 9.9  DVT Prophylaxis  SCDs  Code Status: DNR  Family Communication: None at bedside  Disposition Plan: Admitted.  Need SNF placement  Consultants Neurosurgery  Procedures  None  Antibiotics   Anti-infectives (From admission, onward)   None      Subjective:   [name] seen and examined today.  Patient with history of dementia and legally blind.  Objective:   Vitals:   03/05/19 2355 03/06/19 0116 03/06/19 0335 03/06/19 0803  BP: (!) 153/94  (!) 179/70 134/70  Pulse: 84 78 94 72  Resp: 18  18 18   Temp: (!) 97.5 F (36.4 C)  98.2 F (36.8 C) 98.2 F (36.8 C)  TempSrc: Oral  Oral Axillary  SpO2: 96% 96% 96% 93%  Weight:      Height:        Intake/Output Summary (Last 24 hours) at 03/06/2019 0828 Last data filed at 03/06/2019 8916 Gross per 24 hour  Intake 615 ml  Output 1190 ml  Net -575 ml   Filed Weights   03/03/19 1915 03/04/19 0259 03/05/19 0809  Weight: 63.5 kg 63.1 kg 63.1 kg   Exam  General: Well developed, elderly, chronically ill appearing, NAD  HEENT: NCAT,  mucous membranes moist.   Cardiovascular: S1 S2 auscultated, RRR  Respiratory: Clear to auscultation bilaterally  with equal chest rise  Abdomen: Soft, nontender, nondistended, + bowel sounds  Extremities: warm dry without cyanosis clubbing or edema  Data Reviewed: I have personally reviewed following labs and imaging studies  CBC: Recent Labs  Lab 03/03/19 2046 03/04/19 0548 03/05/19 0302 03/06/19 0235  WBC 15.9* 11.9*  --   --   NEUTROABS 13.6*   --   --   --   HGB 12.1* 10.8* 10.0* 9.9*  HCT 37.7* 32.7* 31.7* 31.0*  MCV 92.2 92.1  --   --   PLT 336 323  --   --    Basic Metabolic Panel: Recent Labs  Lab 03/03/19 2046 03/04/19 0548 03/05/19 0302 03/06/19 0235  NA 133* 132* 136 136  K 3.7 4.4 3.6 3.4*  CL 97* 97* 100 102  CO2 24 21* 21* 21*  GLUCOSE 307* 411* 218* 276*  BUN 26* 24* 20 18  CREATININE 1.31* 1.39* 1.31* 1.28*  CALCIUM 8.6* 8.4* 8.6* 8.5*   GFR: Estimated Creatinine Clearance: 33.5 mL/min (A) (by C-G formula based on SCr of 1.28 mg/dL (H)). Liver Function Tests: Recent Labs  Lab 03/03/19 2046 03/04/19 0548  AST 15 13*  ALT 13 13  ALKPHOS 120 109  BILITOT 0.8 1.3*  PROT 6.5 5.7*  ALBUMIN 3.5 2.9*   No results for input(s): LIPASE, AMYLASE in the last 168 hours. No results for input(s): AMMONIA in the last 168 hours. Coagulation Profile: No results for input(s): INR, PROTIME in the last 168 hours. Cardiac Enzymes: Recent Labs  Lab 03/03/19 2046  TROPONINI <0.03   BNP (last 3 results) No results for input(s): PROBNP in the last 8760 hours. HbA1C: Recent Labs    03/04/19 0548  HGBA1C 7.8*   CBG: Recent Labs  Lab 03/05/19 1139 03/05/19 1634 03/05/19 2131 03/06/19 0628 03/06/19 0757  GLUCAP 159* 120* 146* 320* 317*   Lipid Profile: No results for input(s): CHOL, HDL, LDLCALC, TRIG, CHOLHDL, LDLDIRECT in the last 72 hours. Thyroid Function Tests: No results for input(s): TSH, T4TOTAL, FREET4, T3FREE, THYROIDAB in the last 72 hours. Anemia Panel: No results for input(s): VITAMINB12, FOLATE, FERRITIN, TIBC, IRON, RETICCTPCT in the last 72 hours. Urine analysis:    Component Value Date/Time   COLORURINE YELLOW 03/03/2019 2034   APPEARANCEUR CLEAR 03/03/2019 2034   LABSPEC 1.025 03/03/2019 2034   PHURINE 5.5 03/03/2019 2034   GLUCOSEU >=500 (A) 03/03/2019 2034   HGBUR NEGATIVE 03/03/2019 2034   BILIRUBINUR SMALL (A) 03/03/2019 2034   KETONESUR 15 (A) 03/03/2019 2034   PROTEINUR  NEGATIVE 03/03/2019 2034   UROBILINOGEN 0.2 10/29/2011 0555   NITRITE NEGATIVE 03/03/2019 2034   LEUKOCYTESUR NEGATIVE 03/03/2019 2034   Sepsis Labs: @LABRCNTIP (procalcitonin:4,lacticidven:4)  ) Recent Results (from the past 240 hour(s))  Urine culture     Status: None   Collection Time: 03/03/19  8:34 PM  Result Value Ref Range Status   Specimen Description   Final    URINE, CATHETERIZED Performed at [facility], [address] [place], [place] [address]    Special Requests   Final    NONE Performed at [facility], [address], [place], [place] [address]    Culture   Final    NO GROWTH Performed at [facility], [place] [address] [place], [place] [address]    Report Status 03/05/2019 FINAL  Final      Radiology Studies: Ct Head Wo Contrast  Result Date: 03/04/2019 CLINICAL DATA:  Followup left-sided extra-axial hemorrhage. EXAM: CT HEAD WITHOUT CONTRAST  TECHNIQUE: Contiguous axial images were obtained from the base of the skull through the vertex without intravenous contrast. COMPARISON:  03/03/2019 FINDINGS: Brain: No change since the previous study. Extra-axial hematoma in the left lateral frontotemporal convexity shows maximal thickness of 9 mm compared with 10 mm previously. This is favored to be subdural, though epidural hematoma is not completely excluded. No significant mass effect. No other intracranial hemorrhage evident. Generalized brain atrophy with chronic small-vessel ischemic changes throughout the brain as seen previously. Vascular: There is atherosclerotic calcification of the major vessels at the base of the brain. Skull: No skull fracture or skull base fracture seen. Sinuses/Orbits: Chronic inflammatory opacification of the left maxillary sinus. Some chronic ethmoid disease, left more than right. Orbits negative. Other: None IMPRESSION: No worsening since yesterday. Extra-axial hematoma in the left lateral frontotemporal convexity shows maximal thickness of  9 mm today, measured at 10 mm yesterday. No evidence of additional bleeding. No mass effect. This could be either subdural or epidural, but is not enlarging or worsening. Electronically Signed   By: Nelson Chimes M.D.   On: 03/04/2019 09:44     Scheduled Meds:  citalopram  20 mg Oral q morning - 10a   insulin aspart  0-9 Units Subcutaneous TID WC   insulin detemir  5 Units Subcutaneous Daily   latanoprost  1 drop Both Eyes QHS   potassium chloride  40 mEq Oral Daily   tamsulosin  0.4 mg Oral Daily   Continuous Infusions:  sodium chloride 75 mL/hr at 03/05/19 0931     LOS: 3 days   Time Spent in minutes   30 minutes  Manon Banbury D.O. on 03/06/2019 at 8:28 AM  Between 7am to 7pm - Please see pager noted on [email or web address]  After 7pm go to [email or web address]  And look for the night coverage person covering for me after hours  [facility] Office  [phone]

## 2019-03-06 NOTE — Progress Notes (Signed)
CSW called and spoke with Gerald Stabs at [facility]. [facility] is not able to take any new SNF's without a negative covid screening. Patient does not have the signs or symptoms of COVID-19.   CSW spoke with the patient's son [name], [place] received permission to fax out to other facilities in [place] and [place].   CSW will continue to assist with discharge planning.   Domenic Schwab, MSW, [place]

## 2019-03-07 LAB — GLUCOSE, CAPILLARY
Glucose-Capillary: 129 mg/dL — ABNORMAL HIGH (ref 70–99)
Glucose-Capillary: 191 mg/dL — ABNORMAL HIGH (ref 70–99)
Glucose-Capillary: 197 mg/dL — ABNORMAL HIGH (ref 70–99)
Glucose-Capillary: 253 mg/dL — ABNORMAL HIGH (ref 70–99)
Glucose-Capillary: 281 mg/dL — ABNORMAL HIGH (ref 70–99)
Glucose-Capillary: 29 mg/dL — CL (ref 70–99)
Glucose-Capillary: 293 mg/dL — ABNORMAL HIGH (ref 70–99)
Glucose-Capillary: 34 mg/dL — CL (ref 70–99)
Glucose-Capillary: 52 mg/dL — ABNORMAL LOW (ref 70–99)

## 2019-03-07 LAB — BASIC METABOLIC PANEL
Anion gap: 12 (ref 5–15)
BUN: 17 mg/dL (ref 8–23)
CO2: 21 mmol/L — ABNORMAL LOW (ref 22–32)
Calcium: 8.4 mg/dL — ABNORMAL LOW (ref 8.9–10.3)
Chloride: 105 mmol/L (ref 98–111)
Creatinine, Ser: 1.03 mg/dL (ref 0.61–1.24)
GFR calc Af Amer: >60 mL/min (ref >60)
GFR calc non Af Amer: >60 mL/min (ref >60)
Glucose, Bld: 256 mg/dL — ABNORMAL HIGH (ref 70–99)
Potassium: 4.7 mmol/L (ref 3.5–5.1)
Sodium: 138 mmol/L (ref 135–145)

## 2019-03-07 LAB — HEMOGLOBIN AND HEMATOCRIT, BLOOD
HCT: 35.6 % — ABNORMAL LOW (ref 39.0–52.0)
Hemoglobin: 11.3 g/dL — ABNORMAL LOW (ref 13.0–17.0)

## 2019-03-07 MED ORDER — HYDRALAZINE HCL 20 MG/ML IJ SOLN
10.0000 mg | Freq: Four times a day (QID) | INTRAMUSCULAR | Status: DC | PRN
  Administered 2019-03-07: 04:00:00 10 mg via INTRAVENOUS
  Filled 2019-03-07: qty 1

## 2019-03-07 MED ORDER — DEXTROSE 50 % IV SOLN
INTRAVENOUS | Status: AC
  Administered 2019-03-07: 20:00:00 25 mL
  Filled 2019-03-07: qty 50

## 2019-03-07 NOTE — Progress Notes (Signed)
PROGRESS NOTE    Eduardo Lee  EHO:[id number] DOB: 04/23/1926 DOA: 03/03/2019 PCP: Mikey Kirschner, MD   Brief Narrative:  HPI On 03/03/2019 by Dr. Eleonore Chiquito Eduardo Lee  is a 85 y.o. 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  As per ED notes family stated that patient usually could stand to pivot to transfer but was unable to do that today.  Also had high blood sugar. No other history is obtainable, patient is a poor historian due to underlying dementia and confusion. In the ED, CT head showed subdural or epidural hemorrhage along the left temporal lobe.  Neurosurgery Dr. Arnoldo Morale was consulted by Dr. Thurnell Garbe, Who recommended K Centra to reverse the effects of Xarelto.  Also if family not keen for surgery patient could stay at [facility] hospital.  Interim history Patient admitted with subdural versus epidural hemorrhage.  Neurosurgery consulted, no surgery needed.  Currently pending SNF placement. Assessment & Plan   Subdural vs epidural hemorrhage  -Noted on CT head: High attenuating extra-axial collection along the left temporal lobe may represent subdural or epidural hemorrhage -Repeat CT head today since yesterday.  Left lateral frontotemporal convexity shows maximal thickness of 9 mm, 10 mm previously -Neurosurgery consulted and appreciated, Dr. Cherrie Distance surgery needed at this time, or further work-up.  Patient can be discharged home and follow-up with his primary care doctor in the future for further anticoagulation. -Per H&P, family does not wish for brain surgery but open to brain whole procedure to drain bleed or hematoma -hemoglobin currently stable, 11.3 -Continue to monitor -PT/OT recommending SNF  History of DVT/PE -Patient was on Xarelto which is now currently held.  Patient was given Kcentra for reversal  Diabetes  mellitus, type 1 -Diabetes coordinator consulted and appreciated, commended adding 5 units of Levemir -Continue insulin sliding scale with CBG monitoring  History of deafness and blindness -Stable  Depression/anxiety -Continue Celexa and klonopin   Chronic kidney disease, stage III -creatinine stable, creatinine approximate 0.9-1.2  Normocytic Anemia -Suspect secondary to the above, will continue to monitor CBC -hemoglobin 9.9  Hypokalemia -resolved with replacement  DVT Prophylaxis  SCDs  Code Status: DNR  Family Communication: None at bedside  Disposition Plan: Admitted.  Pending SNF placement  Consultants Neurosurgery  Procedures  None  Antibiotics   Anti-infectives (From admission, onward)   None      Subjective:   Eduardo Lee seen and examined today.  Patient with history of dementia and legally blind. No complaints today.  Objective:   Vitals:   03/07/19 0024 03/07/19 0107 03/07/19 0319 03/07/19 0511  BP: (!) 184/82 (!) 144/80 (!) 189/80 130/67  Pulse: 84  80 74  Resp: 18     Temp: (!) 97.5 F (36.4 C)  97.9 F (36.6 C)   TempSrc: Oral  Axillary   SpO2: 100%  99%   Weight:      Height:        Intake/Output Summary (Last 24 hours) at 03/07/2019 0829 Last data filed at 03/07/2019 0600 Gross per 24 hour  Intake 3698.75 ml  Output -  Net 3698.75 ml   Filed Weights   03/03/19 1915 03/04/19 0259 03/05/19 0809  Weight: 63.5 kg 63.1 kg 63.1 kg   Exam  General: Well developed, elderly, chronically ill appearing, NAD  HEENT: NCAT, mucous membranes moist.   Cardiovascular: S1 S2 auscultated, +SEM,  RRR  Respiratory: Clear to auscultation bilaterally with equal chest rise  Abdomen: Soft, nontender, nondistended, + bowel sounds  Extremities: warm dry without cyanosis clubbing or edema of LE . R hand edema  Data Reviewed: I have personally reviewed following labs and imaging studies  CBC: Recent Labs  Lab 03/03/19 2046 03/04/19 0548  03/05/19 0302 03/06/19 0235 03/07/19 0426  WBC 15.9* 11.9*  --   --   --   NEUTROABS 13.6*  --   --   --   --   HGB 12.1* 10.8* 10.0* 9.9* 11.3*  HCT 37.7* 32.7* 31.7* 31.0* 35.6*  MCV 92.2 92.1  --   --   --   PLT 336 323  --   --   --    Basic Metabolic Panel: Recent Labs  Lab 03/03/19 2046 03/04/19 0548 03/05/19 0302 03/06/19 0235 03/07/19 0426  NA 133* 132* 136 136 138  K 3.7 4.4 3.6 3.4* 4.7  CL 97* 97* 100 102 105  CO2 24 21* 21* 21* 21*  GLUCOSE 307* 411* 218* 276* 256*  BUN 26* 24* 20 18 17   CREATININE 1.31* 1.39* 1.31* 1.28* 1.03  CALCIUM 8.6* 8.4* 8.6* 8.5* 8.4*   GFR: Estimated Creatinine Clearance: 41.7 mL/min (by C-G formula based on SCr of 1.03 mg/dL). Liver Function Tests: Recent Labs  Lab 03/03/19 2046 03/04/19 0548  AST 15 13*  ALT 13 13  ALKPHOS 120 109  BILITOT 0.8 1.3*  PROT 6.5 5.7*  ALBUMIN 3.5 2.9*   No results for input(s): LIPASE, AMYLASE in the last 168 hours. No results for input(s): AMMONIA in the last 168 hours. Coagulation Profile: No results for input(s): INR, PROTIME in the last 168 hours. Cardiac Enzymes: Recent Labs  Lab 03/03/19 2046  TROPONINI <0.03   BNP (last 3 results) No results for input(s): PROBNP in the last 8760 hours. HbA1C: No results for input(s): HGBA1C in the last 72 hours. CBG: Recent Labs  Lab 03/06/19 1630 03/06/19 2125 03/07/19 0031 03/07/19 0555 03/07/19 0745  GLUCAP 191* 176* 197* 293* 281*   Lipid Profile: No results for input(s): CHOL, HDL, LDLCALC, TRIG, CHOLHDL, LDLDIRECT in the last 72 hours. Thyroid Function Tests: No results for input(s): TSH, T4TOTAL, FREET4, T3FREE, THYROIDAB in the last 72 hours. Anemia Panel: No results for input(s): VITAMINB12, FOLATE, FERRITIN, TIBC, IRON, RETICCTPCT in the last 72 hours. Urine analysis:    Component Value Date/Time   COLORURINE YELLOW 03/03/2019 2034   APPEARANCEUR CLEAR 03/03/2019 2034   LABSPEC 1.025 03/03/2019 2034   PHURINE 5.5  03/03/2019 2034   GLUCOSEU >=500 (A) 03/03/2019 2034   HGBUR NEGATIVE 03/03/2019 2034   BILIRUBINUR SMALL (A) 03/03/2019 2034   KETONESUR 15 (A) 03/03/2019 2034   PROTEINUR NEGATIVE 03/03/2019 2034   UROBILINOGEN 0.2 10/29/2011 0555   NITRITE NEGATIVE 03/03/2019 2034   LEUKOCYTESUR NEGATIVE 03/03/2019 2034   Sepsis Labs: @LABRCNTIP (procalcitonin:4,lacticidven:4)  ) Recent Results (from the past 240 hour(s))  Urine culture     Status: None   Collection Time: 03/03/19  8:34 PM  Result Value Ref Range Status   Specimen Description   Final    URINE, CATHETERIZED Performed at [facility], [address], [place], [place] [address]    Special Requests   Final    NONE Performed at [facility], [address], [place], [place] [address]    Culture   Final    NO GROWTH Performed at [facility], [place] [address] [place], [place] [address]  Report Status 03/05/2019 FINAL  Final      Radiology Studies: No results found.   Scheduled Meds: . citalopram  20 mg Oral q morning - 10a  . insulin aspart  0-9 Units Subcutaneous TID WC  . insulin detemir  5 Units Subcutaneous Daily  . latanoprost  1 drop Both Eyes QHS  . potassium chloride  40 mEq Oral Daily  . tamsulosin  0.4 mg Oral Daily   Continuous Infusions: . sodium chloride 75 mL/hr at 03/06/19 1437     LOS: 4 days   Time Spent in minutes   30 minutes  Thayne Cindric D.O. on 03/07/2019 at 8:29 AM  Between 7am to 7pm - Please see pager noted on [email or web address]  After 7pm go to [email or web address]  And look for the night coverage person covering for me after hours  [facility] Office  [phone]

## 2019-03-08 ENCOUNTER — Inpatient Hospital Stay (HOSPITAL_COMMUNITY): Payer: Medicare Other

## 2019-03-08 LAB — GLUCOSE, CAPILLARY
Glucose-Capillary: 361 mg/dL — ABNORMAL HIGH (ref 70–99)
Glucose-Capillary: 293 mg/dL — ABNORMAL HIGH (ref 70–99)
Glucose-Capillary: 184 mg/dL — ABNORMAL HIGH (ref 70–99)
Glucose-Capillary: 30 mg/dL — CL (ref 70–99)
Glucose-Capillary: 146 mg/dL — ABNORMAL HIGH (ref 70–99)
Glucose-Capillary: 33 mg/dL — CL (ref 70–99)
Glucose-Capillary: 54 mg/dL — ABNORMAL LOW (ref 70–99)

## 2019-03-08 MED ORDER — ALBUTEROL SULFATE (2.5 MG/3ML) 0.083% IN NEBU: 2.5000 mg | INHALATION_SOLUTION | RESPIRATORY_TRACT | Status: DC | PRN

## 2019-03-08 MED ORDER — DEXTROSE 50 % IV SOLN
INTRAVENOUS | Status: AC
  Administered 2019-03-08: 22:00:00 25 mL
  Filled 2019-03-08: qty 50

## 2019-03-08 MED ORDER — CLONAZEPAM 0.5 MG PO TABS: 0.5000 mg | ORAL_TABLET | Freq: Two times a day (BID) | ORAL | 0 refills | Status: DC | PRN

## 2019-03-08 MED ORDER — ALBUTEROL SULFATE (2.5 MG/3ML) 0.083% IN NEBU
INHALATION_SOLUTION | RESPIRATORY_TRACT | Status: AC
  Filled 2019-03-08: qty 3

## 2019-03-08 MED ORDER — LORAZEPAM 2 MG/ML IJ SOLN: 0.2500 mg | Freq: Once | INTRAMUSCULAR | Status: DC

## 2019-03-08 MED ORDER — LORAZEPAM 2 MG/ML IJ SOLN
INTRAMUSCULAR | Status: AC
  Filled 2019-03-08: qty 1

## 2019-03-08 MED ORDER — FUROSEMIDE 40 MG PO TABS
40.0000 mg | ORAL_TABLET | Freq: Once | ORAL | Status: AC
  Administered 2019-03-08: 14:00:00 40 mg via ORAL
  Filled 2019-03-08: qty 1

## 2019-03-08 NOTE — Progress Notes (Signed)
Occupational Therapy Treatment Patient Details Name: Eduardo Lee MRN: [id number] DOB: May 12, 1926 Today's Date: 03/08/2019    History of present illness Pt is a 85 y.o. M with significant PMH of DVT, hypertension, legally blind, dementia who was brought in for generalized weakness and confusion. Head CT showing left subdural hematoma.    OT comments  Patient progressing slowly. Able to follow simple 1 step commands given increased time.  Completes transfers with min assist to stand, but requires mod assist +2 safety to maintain standing; incontinent of BM with no awareness during session.  Total assist for toileting. DC plan remains appropriate. Will follow.    Follow Up Recommendations  SNF;Supervision/Assistance - 24 hour    Equipment Recommendations  Other (comment)(TBD)    Recommendations for Other Services      Precautions / Restrictions Precautions Precautions: Fall Precaution Comments: Pt is blind Restrictions Weight Bearing Restrictions: No       Mobility Bed Mobility Overal bed mobility: Needs Assistance Bed Mobility: Supine to Sit     Supine to sit: Min assist     General bed mobility comments: min assist for trunk support to EOB   Transfers Overall transfer level: Needs assistance Equipment used: 2 person hand held assist;Rolling walker (2 wheeled) Transfers: Sit to/from Stand Sit to Stand: Min assist;Mod assist;+2 safety/equipment         General transfer comment: min assist +2 to ascend into standing, mod assist to sustain standing during transfers     Balance Overall balance assessment: Needs assistance Sitting-balance support: Feet supported;Bilateral upper extremity supported Sitting balance-Leahy Scale: Zero Sitting balance - Comments: min assist at EOB statically, mod assist with posterior lean dynamically Postural control: Posterior lean Standing balance support: Bilateral upper extremity supported Standing balance-Leahy Scale:  Poor Standing balance comment: reliant on external support                           ADL either performed or assessed with clinical judgement   ADL Overall ADL's : Needs assistance/impaired                     Lower Body Dressing: Total assistance;+2 for physical assistance;Sit to/from stand   Toilet Transfer: Moderate assistance;+2 for physical assistance;Ambulation   Toileting- Clothing Manipulation and Hygiene: Total assistance;Sit to/from stand       Functional mobility during ADLs: Moderate assistance;Rolling walker;Cueing for safety;Cueing for sequencing General ADL Comments: limited by generalized weakness, impaired balance, and cognition; loose BM with no awareness during session      Vision       Perception     Praxis      Cognition Arousal/Alertness: Awake/alert Behavior During Therapy: Restless Overall Cognitive Status: No family/caregiver present to determine baseline cognitive functioning                                 General Comments: hx of dementia, able to follow simple 1 step commands with increased time        Exercises     Shoulder Instructions       General Comments      Pertinent Vitals/ Pain       Pain Assessment: Faces Faces Pain Scale: No hurt  Home Living  Prior Functioning/Environment              Frequency  Min 2X/week        Progress Toward Goals  OT Goals(current goals can now be found in the care plan section)  Progress towards OT goals: Progressing toward goals  Acute Rehab OT Goals Patient Stated Goal: none stated OT Goal Formulation: With patient Time For Goal Achievement: 03/19/19 Potential to Achieve Goals: Good ADL Goals Pt Will Transfer to Toilet: with min assist;with +2 assist;ambulating  Plan Discharge plan remains appropriate;Frequency remains appropriate    Co-evaluation                 AM-PAC  OT "6 Clicks" Daily Activity     Outcome Measure   Help from another person eating meals?: Total Help from another person taking care of personal grooming?: Total Help from another person toileting, which includes using toliet, bedpan, or urinal?: Total Help from another person bathing (including washing, rinsing, drying)?: Total Help from another person to put on and taking off regular upper body clothing?: Total Help from another person to put on and taking off regular lower body clothing?: Total 6 Click Score: 6    End of Session    OT Visit Diagnosis: Other abnormalities of gait and mobility (R26.89);Other symptoms and signs involving cognitive function;Muscle weakness (generalized) (M62.81)   Activity Tolerance Patient tolerated treatment well   Patient Left in chair;with call bell/phone within reach;with chair alarm set   Nurse Communication Mobility status        Time: 0263-7858 OT Time Calculation (min): 27 min  Charges: OT General Charges $OT Visit: 1 Visit OT Treatments $Self Care/Home Management : 8-22 mins  Delight Stare, [place] Pager [phone] Office [phone]    Delight Stare 03/08/2019, 1:20 PM

## 2019-03-08 NOTE — Progress Notes (Signed)
Rapid called patient short of breath

## 2019-03-08 NOTE — Significant Event (Addendum)
Rapid Response Event Note  Overview: Respiratory  Initial Focused Assessment: Called by 3W nurses, patient was leaving with [facility] to go to a SNF, when [facility] staff noticed patient having trouble breathing and had low oxygen saturations.  Per nurse, this started after patient quickly ingested KCL oral solution (K replacement). Upon arrival, patient was on a NRB 15L 100% withs saturations of 100%. Appears in mild distress, frequent cough, + wheezing in the upper fields, coarse crackles in the lower fields. VSS. Oxygen weaned down to 2L [place]  No PIV, multiple tries, VAST RN consulted. TRH MD came to the bedside.   Interventions:  -- STAT CXR - Increased patchy airspace opacities at both lung bases with possible small pleural effusions, consistent with aspiration. -- Albuterol Q4H PRN wheezing - wheezing improved, appears more comfortable -- Ativan 0.25 mg IV x 1 - held for now.  -- Lasix 40 mg  PO x 1  Plan of Care: -- No PIV needed for now -- Wean oxygen down as patient tolerates.  -- Monitor UOP after Lasix administration  Event Summary:  Call Time 1232 Arrival Time 3736 End Time 1310  Eduardo Lee

## 2019-03-08 NOTE — Progress Notes (Signed)
Patient o2 sat will not remian above 80% on room air, declined transport per [employer]

## 2019-03-08 NOTE — Progress Notes (Signed)
Inpatient Diabetes Program Recommendations  AACE/ADA: New Consensus Statement on Inpatient Glycemic Control (2015)  Target Ranges:  Prepandial:   less than 140 mg/dL      Peak postprandial:   less than 180 mg/dL (1-2 hours)      Critically ill patients:  140 - 180 mg/dL   Lab Results  Component Value Date   GLUCAP 361 (H) 03/08/2019   HGBA1C 7.8 (H) 03/04/2019  Results for NIQUAN, CHARNLEY "PETE" (MRN [id number]) as of 03/08/2019 14:13  Ref. Range 03/07/2019 07:45 03/07/2019 11:10 03/07/2019 19:28 03/07/2019 19:59 03/07/2019 20:20 03/07/2019 20:45 03/07/2019 21:47 03/08/2019 06:08  Glucose-Capillary Latest Ref Range: 70 - 99 mg/dL 281 (H) 253 (H) 29 (LL) 34 (LL) 52 (L) 129 (H) 191 (H) 361 (H)    Diabetes history: Type 1 DM Outpatient Diabetes medications: Humalog 4-7 units TID, Tresiba 6 units QHS Current orders for Inpatient glycemic control: Novolog 0-9 units TID, Levemir 5 units daily  Inpatient Diabetes Program Recommendations:    Please consider splitting Levemir to 3 units bid q 12 hours.  Also may need reduction in Novolog correction 151-200- 2 units, 201-250-3 units, 251-300-4 units, 301-350-5 units, 351-400-6 units.    Thanks,  Adah Perl, RN, BC-ADM Inpatient Diabetes Coordinator Pager [phone] (8a-5p)

## 2019-03-08 NOTE — Progress Notes (Signed)
[facility] RN consulted for PIV placement as RRT could not obtain access. Upon discussing plan of care with physician, decided pt does not need IV access at this time.

## 2019-03-08 NOTE — TOC Progression Note (Signed)
Transition of Care [facility]) - Progression Note    Patient Details  Name: Eduardo Lee MRN: [id number] Date of Birth: [birth year]  Transition of Care [facility]) CM/SW [place], [place] Phone Number: 03/08/2019, 1:47 PM  Clinical Narrative:   CSW updated by RN that patient discharge is on hold for today. CSW confirmed with [facility] that they can continue to take the patient when he becomes medically stable.    Expected Discharge Plan: [place] Barriers to Discharge: Continued Medical Work up  Expected Discharge Plan and Services Expected Discharge Plan: [place] arrangements for the past 2 months: Single Family Home Expected Discharge Date: 03/22/2019                         Social Determinants of Health (SDOH) Interventions    Readmission Risk Interventions No flowsheet data found.

## 2019-03-08 NOTE — Progress Notes (Signed)
Physical Therapy Treatment Patient Details Name: Eduardo Lee MRN: [id number] DOB: [birth year] Today's Date: 03/08/2019    History of Present Illness Pt is a 85 y.o. M with significant PMH of DVT, hypertension, legally blind, dementia who was brought in for generalized weakness and confusion. Head CT showing left subdural hematoma.     PT Comments    Pt performed gait training short bouts in room limited by bouts of stool incontinence.  He continues to require min to moderate assistance for functional mobility and continues to benefit from skilled rehab in a post acute setting.  Plan next session for gait training.      Follow Up Recommendations  SNF;Supervision/Assistance - 24 hour     Equipment Recommendations  None recommended by PT    Recommendations for Other Services       Precautions / Restrictions Precautions Precautions: Fall Precaution Comments: Pt is blind Restrictions Weight Bearing Restrictions: No    Mobility  Bed Mobility Overal bed mobility: Needs Assistance Bed Mobility: Supine to Sit     Supine to sit: Mod assist     General bed mobility comments: mod assist for trunk support to EOB   Transfers Overall transfer level: Needs assistance Equipment used: Rolling walker (2 wheeled) Transfers: Sit to/from Stand Sit to Stand: Min assist;Mod assist;+2 safety/equipment         General transfer comment: min assist +2 to ascend into standing, mod assist to sustain standing during transfers   Ambulation/Gait Ambulation/Gait assistance: Mod assist;+2 safety/equipment Gait Distance (Feet): 6 Feet(+ 15 ft) Assistive device: 2 person hand held assist;Rolling walker (2 wheeled) Gait Pattern/deviations: Trunk flexed;Shuffle;Steppage;Decreased stride length         Stairs             Wheelchair Mobility    Modified Rankin (Stroke Patients Only)       Balance Overall balance assessment: Needs assistance Sitting-balance support: Feet  supported;Bilateral upper extremity supported Sitting balance-Leahy Scale: Poor Sitting balance - Comments: min assist at EOB statically, mod assist with posterior lean dynamically Postural control: Posterior lean Standing balance support: Bilateral upper extremity supported Standing balance-Leahy Scale: Poor Standing balance comment: reliant on external support                            Cognition Arousal/Alertness: Awake/alert Behavior During Therapy: Restless Overall Cognitive Status: No family/caregiver present to determine baseline cognitive functioning                                 General Comments: hx of dementia, able to follow simple 1 step commands with increased time      Exercises      General Comments        Pertinent Vitals/Pain Pain Assessment: No/denies pain Faces Pain Scale: No hurt    Home Living                      Prior Function            PT Goals (current goals can now be found in the care plan section) Acute Rehab PT Goals Patient Stated Goal: none stated Potential to Achieve Goals: Fair Progress towards PT goals: Progressing toward goals    Frequency    Min 3X/week      PT Plan Current plan remains appropriate    Co-evaluation PT/OT/SLP Co-Evaluation/Treatment: Yes Reason for Co-Treatment:  Complexity of the patient's impairments (multi-system involvement);For patient/therapist safety;Necessary to address cognition/behavior during functional activity;To address functional/ADL transfers PT goals addressed during session: Mobility/safety with mobility OT goals addressed during session: ADL's and self-care      AM-PAC PT "6 Clicks" Mobility   Outcome Measure  Help needed turning from your back to your side while in a flat bed without using bedrails?: Total Help needed moving from lying on your back to sitting on the side of a flat bed without using bedrails?: Total Help needed moving to and from a  bed to a chair (including a wheelchair)?: Total Help needed standing up from a chair using your arms (e.g., wheelchair or bedside chair)?: Total Help needed to walk in hospital room?: Total Help needed climbing 3-5 steps with a railing? : Total 6 Click Score: 6    End of Session Equipment Utilized During Treatment: Gait belt Activity Tolerance: Patient tolerated treatment well Patient left: with nursing/sitter in room;in chair;with call bell/phone within reach;with chair alarm set Nurse Communication: Mobility status PT Visit Diagnosis: Unsteadiness on feet (R26.81);Other abnormalities of gait and mobility (R26.89);Muscle weakness (generalized) (M62.81)     Time: 7741-2878 PT Time Calculation (min) (ACUTE ONLY): 25 min  Charges:  $Gait Training: 8-22 mins                     Governor Rooks, PTA Acute Rehabilitation Services Pager [phone] Office [phone]     Yoana Staib Eli Hose 03/08/2019, 3:55 PM

## 2019-03-08 NOTE — Progress Notes (Signed)
Bed exit placed on medium setting

## 2019-03-08 NOTE — Progress Notes (Addendum)
CBG 30. Alert and oriented to self. Given juices. Recheck in 15 minutes, still 33.  Recheck in 37minutes CBG 54. Inserted PIV. Administered 1/2 of D50.  Recheck 146. NP made aware.

## 2019-03-08 NOTE — Discharge Summary (Addendum)
Physician Discharge Summary  Eduardo Lee YIF:[id number] DOB: [birth year] DOA: 03/03/2019  PCP: Eduardo Kirschner, MD  Admit date: 03/03/2019 Discharge date: 04/01/2019  Time spent: 45 minutes  Recommendations for Outpatient Follow-up:  Patient will be discharged to skilled nursing facility, continue physical and occupational therapy.  Patient will need to follow up with primary care provider within one week of discharge, repeat CBC and discuss further anticoagulation.  Patient should continue medications as prescribed.  Patient should follow a carb modified diet.    Discharge Diagnoses:  Subdural vs epidural hemorrhage  History of DVT/PE Diabetes mellitus, type 1 History of deafness and blindness Depression/anxiety Chronic kidney disease, stage III Normocytic Anemia Hypokalemia Aspiration pneumonia  Discharge Condition: Stable  Diet recommendation: carb modified  Filed Weights   03/03/19 1915 03/04/19 0259 03/05/19 0809  Weight: 63.5 kg 63.1 kg 63.1 kg    History of present illness:  On 03/03/2019 by Eduardo Lee a85 y.o.male,with history of DVT on chronic anticoagulation with Xarelto, hypertension, legally blind, mild dementia was brought to hospital with complaints of generalized weakness and confusion for past 3 days. As per ED notes family stated that patient usually could stand to pivot to transfer but was unable to do that today. Also had high blood sugar. No other history is obtainable, patient is a poor historian due to underlying dementia and confusion. In the ED, CT head showed subdural or epidural hemorrhage along the left temporal lobe. Eduardo Lee was consulted by Eduardo Lee recommended Eduardo Lee to reverse the effects of Xarelto. Also if family not keen for surgery patient could stay at [facility] Hospital Course:  Subdural vs epidural hemorrhage  -Noted on CT head: High attenuating extra-axial collection along the  left temporal lobe may represent subdural or epidural hemorrhage -Repeat CT head today since yesterday.  Left lateral frontotemporal convexity shows maximal thickness of 9 mm, 10 mm previously -Eduardo consulted and appreciated, Eduardo Lee surgery needed at this time, or further work-up.  Patient can be discharged home and follow-up with his primary care doctor in the future for further anticoagulation. -Per H&P, family does not wish for brain surgery but open to brain whole procedure to drain bleed or hematoma -hemoglobin currently stable, 11.3 -Continue to monitor -PT/OT recommending SNF  History of DVT/PE -Patient was on Xarelto which is now currently held.  Patient was given Kcentra for reversal  Diabetes mellitus, type 1 -Diabetes coordinator consulted and appreciated, commended adding 5 units of Levemir -Continue insulin sliding scale with CBG monitoring  History of deafness and blindness -Stable  Depression/anxiety -Continue Celexa and klonopin   Chronic kidney disease, stage III -creatinine stable, creatinine approximate 0.9-1.2  Normocytic Anemia -Suspect secondary to the above, will continue to monitor CBC -hemoglobin 9.9  Hypokalemia -resolved with replacement  Aspiration pneumonia  -On 03/08/2019, with administration of oral/liquid potassium, patient aspirated.  He did have transient hypoxia which then resolved with supplemental oxygen. -Currently patient has been weaned off of supplemental oxygen and on room air maintaining saturations of 90s to 100% -Patient was given 1 dose of oral Lasix and placed on Augmentin -Chest x-ray reviewed showing possible aspiration -Currently patient is afebrile  Code status: DNR  Procedures: None  Consultations: Eduardo   Discharge Exam: Vitals:   03/10/2019 0430 03/15/2019 0725  BP: (!) 152/58 118/62  Pulse: 83 76  Resp: 18 17  Temp: 98.2 F (36.8 C) 98.4 F (36.9 C)  SpO2: 93% 100%     General:  Well developed, elderly, chronically ill appearing, NAD  HEENT: NCAT, mucous membranes moist.  Cardiovascular: S1 S2 auscultated, RRR, +SEM  Respiratory: Diminished breath sounds however clear  Abdomen: Soft, nontender, nondistended, + bowel sounds  Extremities: warm dry without cyanosis clubbing or edema of LE. RUE edema  Neuro: AAOx2 (self, place), moves all extremities   Psych: Normal affect and demeanor  Discharge Instructions Discharge Instructions    Discharge instructions   Complete by:  As directed    Patient will be discharged to skilled nursing facility, continue physical and occupational therapy.  Patient will need to follow up with primary care provider within one week of discharge, repeat CBC and discuss further anticoagulation.  Patient should continue medications as prescribed.  Patient should follow a carb modified diet.     Allergies as of 03/27/2019   No Known Allergies     Medication List    STOP taking these medications   aspirin 81 MG tablet   naproxen sodium 220 MG tablet Commonly known as:  ALEVE   Xarelto 20 MG Tabs tablet Generic drug:  rivaroxaban     TAKE these medications   amoxicillin-clavulanate 875-125 MG tablet Commonly known as:  AUGMENTIN Take 1 tablet by mouth every 12 (twelve) hours.   citalopram 40 MG tablet Commonly known as:  CELEXA Take 1 tablet (40 mg total) by mouth daily. What changed:    how much to take  when to take this   clonazePAM 0.5 MG tablet Commonly known as:  KLONOPIN Take 1 tablet (0.5 mg total) by mouth 2 (two) times daily as needed (agitiation). What changed:  when to take this   insulin lispro 100 UNIT/ML KwikPen Commonly known as:  HumaLOG KwikPen Inject 0-0.06 mLs (0-6 Units total) into the skin 3 (three) times daily. CBG 151 - 200 (dose in units): 2 CBG 201 - 250 (dose in units): 3 CBG 251 - 300 (dose in units): 4 CBG 301 - 350 (dose in units): 5 CBG 351 - 400 (dose in units): 6 What changed:     how much to take  additional instructions   latanoprost 0.005 % ophthalmic solution Commonly known as:  XALATAN Place 1 drop into both eyes at bedtime.   polyethylene glycol powder powder Commonly known as:  GLYCOLAX/MIRALAX Take 34 g by mouth every morning.   tamsulosin 0.4 MG Caps capsule Commonly known as:  Flomax Take 1 capsule (0.4 mg total) by mouth daily. What changed:  when to take this   [place] FlexTouch 100 UNIT/ML Sopn FlexTouch Pen Generic drug:  insulin degludec Inject 4-6 Units into the muscle at bedtime. For blood sugar levels over 300, give 6 units. Give 4-5 units for levels under 300   vitamin B-12 1000 MCG tablet Commonly known as:  CYANOCOBALAMIN Take 1,000 mcg by mouth every morning.   Vitamin D (Ergocalciferol) 1.25 MG (50000 UT) Caps capsule Commonly known as:  DRISDOL Take 50,000 Units by mouth every Wednesday.   Zetia 10 MG tablet Generic drug:  ezetimibe TAKE 1 TABLET(10 MG) BY MOUTH AT BEDTIME What changed:    how much to take  how to take this  when to take this  additional instructions      No Known Allergies  Contact information for follow-up providers    Eduardo Kirschner, MD. Schedule an appointment as soon as possible for a visit in 1 week(s).   Specialty:  Family Medicine Why:  Hospital follow up Contact information: [address] [place]  [place] [address] [phone]            Contact information for after-discharge care    [facility] .   Service:  Skilled Nursing Contact information: [address] [place] [phone]                   The results of significant diagnostics from this hospitalization (including imaging, microbiology, ancillary and laboratory) are listed below for reference.    Significant Diagnostic Studies: Dg Chest 1 View  Result Date: 03/03/2019 CLINICAL DATA:  Altered mental status. Hyperglycemia.  EXAM: CHEST  1 VIEW COMPARISON:  02/26/2017. FINDINGS: Cardiomegaly. Low lung volumes. Bibasilar subsegmental atelectasis. No consolidation or edema. Old rib fractures. No pneumothorax. Osteopenia. IMPRESSION: 1. Cardiomegaly with low lung volumes. Bibasilar subsegmental atelectasis. 2. No consolidation or edema. Electronically Signed   By: Staci Righter M.D.   On: 03/03/2019 20:10   Ct Head Wo Contrast  Result Date: 03/04/2019 CLINICAL DATA:  Followup left-sided extra-axial hemorrhage. EXAM: CT HEAD WITHOUT CONTRAST TECHNIQUE: Contiguous axial images were obtained from the base of the skull through the vertex without intravenous contrast. COMPARISON:  03/03/2019 FINDINGS: Brain: No change since the previous study. Extra-axial hematoma in the left lateral frontotemporal convexity shows maximal thickness of 9 mm compared with 10 mm previously. This is favored to be subdural, though epidural hematoma is not completely excluded. No significant mass effect. No other intracranial hemorrhage evident. Generalized brain atrophy with chronic small-vessel ischemic changes throughout the brain as seen previously. Vascular: There is atherosclerotic calcification of the major vessels at the base of the brain. Skull: No skull fracture or skull base fracture seen. Sinuses/Orbits: Chronic inflammatory opacification of the left maxillary sinus. Some chronic ethmoid disease, left more than right. Orbits negative. Other: None IMPRESSION: No worsening since yesterday. Extra-axial hematoma in the left lateral frontotemporal convexity shows maximal thickness of 9 mm today, measured at 10 mm yesterday. No evidence of additional bleeding. No mass effect. This could be either subdural or epidural, but is not enlarging or worsening. Electronically Signed   By: Nelson Chimes M.D.   On: 03/04/2019 09:44   Ct Head Wo Contrast  Result Date: 03/03/2019 CLINICAL DATA:  85 year old male with confusion. EXAM: CT HEAD WITHOUT CONTRAST TECHNIQUE:  Contiguous axial images were obtained from the base of the skull through the vertex without intravenous contrast. COMPARISON:  Head CT dated 02/11/2018 FINDINGS: Brain: There is mild to moderate age-related atrophy and chronic microvascular ischemic changes. There is a 3.6 x 1.1 cm limited form high attenuating extra-axial collection along the left temporal lobe which may represent a subdural or epidural hemorrhage. There is minimal associated mass effect on the left temporal lobe. No midline shift. Vascular: No hyperdense vessel or unexpected calcification. Skull: Normal. Negative for fracture or focal lesion. Sinuses/Orbits: There is near complete opacification of the left maxillary sinus with diffuse mucoperiosteal thickening of paranasal sinuses. The mastoid air cells are clear. Other: None IMPRESSION: 1. High attenuating extra-axial collection along the left temporal lobe which may represent a subdural or epidural hemorrhage. Close follow-up with CT recommended. 2. Age-related atrophy and chronic microvascular ischemic changes. 3. Paranasal sinus disease. These results were called by telephone at the time of interpretation on 03/03/2019 at 8:11 pm to Dr. Francine Graven , who verbally acknowledged these results. Electronically Signed   By: Anner Crete M.D.   On: 03/03/2019 20:13   Dg Chest [facility] [address]  Result Date: 03/08/2019 CLINICAL DATA:  Food aspiration.  Hypoxia. EXAM: PORTABLE CHEST 1 VIEW COMPARISON:  Radiographs 03/03/2019 and 02/26/2017. FINDINGS: 1355 hours. The heart size and mediastinal contours are stable. There is aortic atherosclerosis. There are increased patchy opacities at both lung bases, left greater than right. There are possible small bilateral pleural effusions. No pneumothorax. Old rib fractures are present on the right. IMPRESSION: Increased patchy airspace opacities at both lung bases with possible small pleural effusions, consistent with aspiration. Electronically Signed   By:  Richardean Sale M.D.   On: 03/08/2019 13:16    Microbiology: Recent Results (from the past 240 hour(s))  Urine culture     Status: None   Collection Time: 03/03/19  8:34 PM  Result Value Ref Range Status   Specimen Description   Final    URINE, CATHETERIZED Performed at [facility], [address], [place], [place] [address]    Special Requests   Final    NONE Performed at [facility], [address] [place], [place] [address]    Culture   Final    NO GROWTH Performed at [facility], [place] [address], [place], [place] [address]    Report Status 03/05/2019 FINAL  Final     Labs: Basic Metabolic Panel: Recent Labs  Lab 03/03/19 2046 03/04/19 0548 03/05/19 0302 03/06/19 0235 03/07/19 0426  NA 133* 132* 136 136 138  K 3.7 4.4 3.6 3.4* 4.7  CL 97* 97* 100 102 105  CO2 24 21* 21* 21* 21*  GLUCOSE 307* 411* 218* 276* 256*  BUN 26* 24* 20 18 17   CREATININE 1.31* 1.39* 1.31* 1.28* 1.03  CALCIUM 8.6* 8.4* 8.6* 8.5* 8.4*   Liver Function Tests: Recent Labs  Lab 03/03/19 2046 03/04/19 0548  AST 15 13*  ALT 13 13  ALKPHOS 120 109  BILITOT 0.8 1.3*  PROT 6.5 5.7*  ALBUMIN 3.5 2.9*   No results for input(s): LIPASE, AMYLASE in the last 168 hours. No results for input(s): AMMONIA in the last 168 hours. CBC: Recent Labs  Lab 03/03/19 2046 03/04/19 0548 03/05/19 0302 03/06/19 0235 03/07/19 0426  WBC 15.9* 11.9*  --   --   --   NEUTROABS 13.6*  --   --   --   --   HGB 12.1* 10.8* 10.0* 9.9* 11.3*  HCT 37.7* 32.7* 31.7* 31.0* 35.6*  MCV 92.2 92.1  --   --   --   PLT 336 323  --   --   --    Cardiac Enzymes: Recent Labs  Lab 03/03/19 2046  TROPONINI <0.03   BNP: BNP (last 3 results) No results for input(s): BNP in the last 8760 hours.  ProBNP (last 3 results) No results for input(s): PROBNP in the last 8760 hours.  CBG: Recent Labs  Lab 03/08/19 2200 03/08/19 2217 03/21/2019 0212 03/29/2019 0619 03/17/2019 0840  GLUCAP 54* 146* 272* 285* 115*        Signed:  Carollynn Pennywell  [facility] 03/16/2019, 10:16 AM

## 2019-03-08 NOTE — TOC Transition Note (Signed)
Transition of Care [facility]) - CM/SW Discharge Note   Patient Details  Name: Eduardo Lee MRN: [id number] Date of Birth: 08-01-1926  Transition of Care [facility]) CM/SW Contact:  Geralynn Ochs, LCSW Phone Number: 03/08/2019, 11:33 AM   Clinical Narrative:  Nurse to call report to [phone], Room 155     Final next level of care: Skilled Nursing Facility Barriers to Discharge: No Barriers Identified   Patient Goals and CMS Choice Patient states their goals for this hospitalization and ongoing recovery are:: patient unable to participate in goal setting CMS Medicare.gov Compare Post Acute Care list provided to:: Patient Represenative (must comment) Choice offered to / list presented to : Adult Children  Discharge Placement              Patient chooses bed at: [facility] Patient to be transferred to facility by: [facility] Name of family member notified: [name] Patient and family notified of of transfer: 03/08/19  Discharge Plan and Services                          Social Determinants of Health (SDOH) Interventions     Readmission Risk Interventions No flowsheet data found.

## 2019-03-08 NOTE — Progress Notes (Addendum)
PROGRESS NOTE    Eduardo Lee  YQM:[id number] DOB: [birth year] DOA: 03/03/2019 PCP: Mikey Kirschner, MD   Brief Narrative:  HPI On 03/03/2019 by Dr. Eleonore Chiquito Quirino Kakos  is a 85 y.o. 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  As per ED notes family stated that patient usually could stand to pivot to transfer but was unable to do that today.  Also had high blood sugar. No other history is obtainable, patient is a poor historian due to underlying dementia and confusion. In the ED, CT head showed subdural or epidural hemorrhage along the left temporal lobe.  Neurosurgery Dr. Arnoldo Morale was consulted by Dr. Thurnell Garbe, Who recommended K Centra to reverse the effects of Xarelto.  Also if family not keen for surgery patient could stay at [facility] hospital.  Interim history Patient admitted with subdural versus epidural hemorrhage.  Neurosurgery consulted, no surgery needed.  Currently pending SNF placement. Assessment & Plan   Subdural vs epidural hemorrhage  -Noted on CT head: High attenuating extra-axial collection along the left temporal lobe may represent subdural or epidural hemorrhage -Repeat CT head today since yesterday.  Left lateral frontotemporal convexity shows maximal thickness of 9 mm, 10 mm previously -Neurosurgery consulted and appreciated, Dr. Cherrie Distance surgery needed at this time, or further work-up.  Patient can be discharged home and follow-up with his primary care doctor in the future for further anticoagulation. -Per H&P, family does not wish for brain surgery but open to brain whole procedure to drain bleed or hematoma -hemoglobin currently stable, 11.3 -Continue to monitor -PT/OT recommending SNF  History of DVT/PE -Patient was on Xarelto which is now currently held.  Patient was given Kcentra for reversal  Diabetes  mellitus, type 1 -Diabetes coordinator consulted and appreciated, commended adding 5 units of Levemir -Continue insulin sliding scale with CBG monitoring  History of deafness and blindness -Stable  Depression/anxiety -Continue Celexa and klonopin   Chronic kidney disease, stage III -creatinine stable, creatinine approximate 0.9-1.2  Normocytic Anemia -Suspect secondary to the above, will continue to monitor CBC -hemoglobin 9.9  Hypokalemia -resolved with replacement   Addendum: Aspiration -patient aspirated on liquid potassium -rapid response called -CXR obtained, reviewed showing some edema -will give lasix 40mg  once -start on Augmentin -currently not hypoxic, will wean supplemental O2  DVT Prophylaxis  SCDs  Code Status: DNR  Family Communication: None at bedside  Disposition Plan: Admitted.  Pending SNF placement  Consultants Neurosurgery  Procedures  None  Antibiotics   Anti-infectives (From admission, onward)   None      Subjective:   Eduardo Lee seen and examined today.  Patient with history of dementia and legally blind. Has no complaints this morning. States he ready to get up and start his day.  Objective:   Vitals:   03/07/19 2031 03/07/19 2324 03/08/19 0404 03/08/19 0825  BP: (!) 107/47 (!) 151/62 (!) 157/80 (!) 147/79  Pulse: 99 88 80 97  Resp: 18 18 17 18   Temp: 98.2 F (36.8 C) 97.7 F (36.5 C) 99.6 F (37.6 C) 99.2 F (37.3 C)  TempSrc:  Oral Oral Oral  SpO2: 96% 95% 96% 98%  Weight:      Height:        Intake/Output Summary (Last 24 hours) at 03/08/2019 0908 Last data filed at 03/08/2019 0640 Gross per 24 hour  Intake 1840 ml  Output 1500 ml  Net 340 ml   Filed Weights   03/03/19 1915 03/04/19 0259 03/05/19 0809  Weight: 63.5 kg 63.1 kg 63.1 kg   Exam  General: Well developed, elderly, chronically ill appearing, NAD  HEENT: NCAT,  mucous membranes moist.   Cardiovascular: S1 S2 auscultated, +SEM,  RRR  Respiratory: Clear to auscultation bilaterally   Abdomen: Soft, nontender, nondistended, + bowel sounds  Extremities: warm dry without cyanosis clubbing or edema of LE. RUE edema  Data Reviewed: I have personally reviewed following labs and imaging studies  CBC: Recent Labs  Lab 03/03/19 2046 03/04/19 0548 03/05/19 0302 03/06/19 0235 03/07/19 0426  WBC 15.9* 11.9*  --   --   --   NEUTROABS 13.6*  --   --   --   --   HGB 12.1* 10.8* 10.0* 9.9* 11.3*  HCT 37.7* 32.7* 31.7* 31.0* 35.6*  MCV 92.2 92.1  --   --   --   PLT 336 323  --   --   --    Basic Metabolic Panel: Recent Labs  Lab 03/03/19 2046 03/04/19 0548 03/05/19 0302 03/06/19 0235 03/07/19 0426  NA 133* 132* 136 136 138  K 3.7 4.4 3.6 3.4* 4.7  CL 97* 97* 100 102 105  CO2 24 21* 21* 21* 21*  GLUCOSE 307* 411* 218* 276* 256*  BUN 26* 24* 20 18 17   CREATININE 1.31* 1.39* 1.31* 1.28* 1.03  CALCIUM 8.6* 8.4* 8.6* 8.5* 8.4*   GFR: Estimated Creatinine Clearance: 41.7 mL/min (by C-G formula based on SCr of 1.03 mg/dL). Liver Function Tests: Recent Labs  Lab 03/03/19 2046 03/04/19 0548  AST 15 13*  ALT 13 13  ALKPHOS 120 109  BILITOT 0.8 1.3*  PROT 6.5 5.7*  ALBUMIN 3.5 2.9*   No results for input(s): LIPASE, AMYLASE in the last 168 hours. No results for input(s): AMMONIA in the last 168 hours. Coagulation Profile: No results for input(s): INR, PROTIME in the last 168 hours. Cardiac Enzymes: Recent Labs  Lab 03/03/19 2046  TROPONINI <0.03   BNP (last 3 results) No results for input(s): PROBNP in the last 8760 hours. HbA1C: No results for input(s): HGBA1C in the last 72 hours. CBG: Recent Labs  Lab 03/07/19 1959 03/07/19 2020 03/07/19 2045 03/07/19 2147 03/08/19 0608  GLUCAP 34* 52* 129* 191* 361*   Lipid Profile: No results for input(s): CHOL, HDL, LDLCALC, TRIG, CHOLHDL, LDLDIRECT in the last 72 hours. Thyroid Function Tests: No results for input(s): TSH, T4TOTAL, FREET4, T3FREE,  THYROIDAB in the last 72 hours. Anemia Panel: No results for input(s): VITAMINB12, FOLATE, FERRITIN, TIBC, IRON, RETICCTPCT in the last 72 hours. Urine analysis:    Component Value Date/Time   COLORURINE YELLOW 03/03/2019 2034   APPEARANCEUR CLEAR 03/03/2019 2034   LABSPEC 1.025 03/03/2019 2034   PHURINE 5.5 03/03/2019 2034   GLUCOSEU >=500 (A) 03/03/2019 2034   HGBUR NEGATIVE 03/03/2019 2034   BILIRUBINUR SMALL (A) 03/03/2019 2034   KETONESUR 15 (A) 03/03/2019 2034   PROTEINUR NEGATIVE 03/03/2019 2034   UROBILINOGEN 0.2 10/29/2011 0555   NITRITE NEGATIVE 03/03/2019 2034   LEUKOCYTESUR NEGATIVE 03/03/2019 2034   Sepsis Labs: @LABRCNTIP (procalcitonin:4,lacticidven:4)  ) Recent Results (from the past 240 hour(s))  Urine culture     Status: None   Collection Time: 03/03/19  8:34 PM  Result Value Ref Range Status   Specimen Description   Final    URINE, CATHETERIZED Performed at [facility], [address] [place], [place] [address]  Special Requests   Final    NONE Performed at [facility], [address], [place], [place] [address]    Culture   Final    NO GROWTH Performed at [facility], [place] [address], [place], [place] [address]    Report Status 03/05/2019 FINAL  Final      Radiology Studies: No results found.   Scheduled Meds:  citalopram  20 mg Oral q morning - 10a   insulin aspart  0-9 Units Subcutaneous TID WC   insulin detemir  5 Units Subcutaneous Daily   latanoprost  1 drop Both Eyes QHS   potassium chloride  40 mEq Oral Daily   tamsulosin  0.4 mg Oral Daily   Continuous Infusions:    LOS: 5 days   Time Spent in minutes   30 minutes  Lena Fieldhouse D.O. on 03/08/2019 at 9:08 AM  Between 7am to 7pm - Please see pager noted on [email or web address]  After 7pm go to [email or web address]  And look for the night coverage person covering for me after hours  [facility] Office  [phone]

## 2019-03-08 NOTE — Plan of Care (Signed)
Adequate for discharge.

## 2019-03-08 NOTE — Progress Notes (Signed)
Assisted patient back to bed and patient is resting well, waiting on transport

## 2019-03-09 ENCOUNTER — Inpatient Hospital Stay
Admission: RE | Admit: 2019-03-09 | Discharge: 2019-05-03 | Disposition: E | Payer: Medicare Other | Source: Ambulatory Visit | Attending: Internal Medicine | Admitting: Internal Medicine

## 2019-03-09 DIAGNOSIS — E1022 Type 1 diabetes mellitus with diabetic chronic kidney disease: Secondary | ICD-10-CM | POA: Diagnosis not present

## 2019-03-09 DIAGNOSIS — N183 Chronic kidney disease, stage 3 (moderate): Secondary | ICD-10-CM | POA: Diagnosis not present

## 2019-03-09 DIAGNOSIS — S32591A Other specified fracture of right pubis, initial encounter for closed fracture: Secondary | ICD-10-CM | POA: Diagnosis not present

## 2019-03-09 DIAGNOSIS — D6489 Other specified anemias: Secondary | ICD-10-CM | POA: Diagnosis not present

## 2019-03-09 DIAGNOSIS — M2550 Pain in unspecified joint: Secondary | ICD-10-CM | POA: Diagnosis not present

## 2019-03-09 DIAGNOSIS — S065X9A Traumatic subdural hemorrhage with loss of consciousness of unspecified duration, initial encounter: Secondary | ICD-10-CM | POA: Diagnosis not present

## 2019-03-09 DIAGNOSIS — J698 Pneumonitis due to inhalation of other solids and liquids: Secondary | ICD-10-CM | POA: Diagnosis not present

## 2019-03-09 DIAGNOSIS — H548 Legal blindness, as defined in USA: Secondary | ICD-10-CM | POA: Diagnosis not present

## 2019-03-09 DIAGNOSIS — E785 Hyperlipidemia, unspecified: Secondary | ICD-10-CM | POA: Diagnosis not present

## 2019-03-09 DIAGNOSIS — F339 Major depressive disorder, recurrent, unspecified: Secondary | ICD-10-CM | POA: Diagnosis not present

## 2019-03-09 DIAGNOSIS — I120 Hypertensive chronic kidney disease with stage 5 chronic kidney disease or end stage renal disease: Secondary | ICD-10-CM | POA: Diagnosis not present

## 2019-03-09 DIAGNOSIS — E1065 Type 1 diabetes mellitus with hyperglycemia: Secondary | ICD-10-CM | POA: Diagnosis not present

## 2019-03-09 DIAGNOSIS — F329 Major depressive disorder, single episode, unspecified: Secondary | ICD-10-CM | POA: Diagnosis not present

## 2019-03-09 DIAGNOSIS — N401 Enlarged prostate with lower urinary tract symptoms: Secondary | ICD-10-CM | POA: Diagnosis not present

## 2019-03-09 DIAGNOSIS — E876 Hypokalemia: Secondary | ICD-10-CM

## 2019-03-09 DIAGNOSIS — R58 Hemorrhage, not elsewhere classified: Secondary | ICD-10-CM | POA: Diagnosis not present

## 2019-03-09 DIAGNOSIS — Z978 Presence of other specified devices: Secondary | ICD-10-CM | POA: Diagnosis not present

## 2019-03-09 DIAGNOSIS — N179 Acute kidney failure, unspecified: Secondary | ICD-10-CM | POA: Diagnosis not present

## 2019-03-09 DIAGNOSIS — X58XXXA Exposure to other specified factors, initial encounter: Secondary | ICD-10-CM | POA: Diagnosis not present

## 2019-03-09 DIAGNOSIS — Z960 Presence of urogenital implants: Secondary | ICD-10-CM | POA: Diagnosis not present

## 2019-03-09 DIAGNOSIS — N390 Urinary tract infection, site not specified: Secondary | ICD-10-CM | POA: Diagnosis not present

## 2019-03-09 DIAGNOSIS — I6200 Nontraumatic subdural hemorrhage, unspecified: Secondary | ICD-10-CM | POA: Diagnosis not present

## 2019-03-09 DIAGNOSIS — Z741 Need for assistance with personal care: Secondary | ICD-10-CM | POA: Diagnosis not present

## 2019-03-09 DIAGNOSIS — Z86711 Personal history of pulmonary embolism: Secondary | ICD-10-CM | POA: Diagnosis not present

## 2019-03-09 DIAGNOSIS — Z7401 Bed confinement status: Secondary | ICD-10-CM | POA: Diagnosis not present

## 2019-03-09 DIAGNOSIS — R42 Dizziness and giddiness: Secondary | ICD-10-CM | POA: Diagnosis not present

## 2019-03-09 DIAGNOSIS — E559 Vitamin D deficiency, unspecified: Secondary | ICD-10-CM | POA: Diagnosis not present

## 2019-03-09 DIAGNOSIS — Z7901 Long term (current) use of anticoagulants: Secondary | ICD-10-CM | POA: Diagnosis not present

## 2019-03-09 DIAGNOSIS — E1029 Type 1 diabetes mellitus with other diabetic kidney complication: Secondary | ICD-10-CM | POA: Diagnosis not present

## 2019-03-09 DIAGNOSIS — R339 Retention of urine, unspecified: Secondary | ICD-10-CM | POA: Diagnosis not present

## 2019-03-09 DIAGNOSIS — E1143 Type 2 diabetes mellitus with diabetic autonomic (poly)neuropathy: Secondary | ICD-10-CM | POA: Diagnosis not present

## 2019-03-09 DIAGNOSIS — W19XXXA Unspecified fall, initial encounter: Principal | ICD-10-CM

## 2019-03-09 DIAGNOSIS — R296 Repeated falls: Secondary | ICD-10-CM | POA: Diagnosis not present

## 2019-03-09 DIAGNOSIS — R279 Unspecified lack of coordination: Secondary | ICD-10-CM | POA: Diagnosis not present

## 2019-03-09 DIAGNOSIS — R0989 Other specified symptoms and signs involving the circulatory and respiratory systems: Secondary | ICD-10-CM

## 2019-03-09 DIAGNOSIS — E1122 Type 2 diabetes mellitus with diabetic chronic kidney disease: Secondary | ICD-10-CM | POA: Diagnosis not present

## 2019-03-09 DIAGNOSIS — R410 Disorientation, unspecified: Secondary | ICD-10-CM | POA: Diagnosis not present

## 2019-03-09 DIAGNOSIS — F0390 Unspecified dementia without behavioral disturbance: Secondary | ICD-10-CM | POA: Diagnosis not present

## 2019-03-09 DIAGNOSIS — I959 Hypotension, unspecified: Secondary | ICD-10-CM | POA: Diagnosis not present

## 2019-03-09 DIAGNOSIS — H9190 Unspecified hearing loss, unspecified ear: Secondary | ICD-10-CM | POA: Diagnosis not present

## 2019-03-09 DIAGNOSIS — E1069 Type 1 diabetes mellitus with other specified complication: Secondary | ICD-10-CM | POA: Diagnosis not present

## 2019-03-09 DIAGNOSIS — R262 Difficulty in walking, not elsewhere classified: Secondary | ICD-10-CM | POA: Diagnosis not present

## 2019-03-09 DIAGNOSIS — E10311 Type 1 diabetes mellitus with unspecified diabetic retinopathy with macular edema: Secondary | ICD-10-CM | POA: Diagnosis not present

## 2019-03-09 DIAGNOSIS — T148XXA Other injury of unspecified body region, initial encounter: Secondary | ICD-10-CM | POA: Diagnosis present

## 2019-03-09 DIAGNOSIS — I4891 Unspecified atrial fibrillation: Secondary | ICD-10-CM | POA: Diagnosis not present

## 2019-03-09 DIAGNOSIS — J690 Pneumonitis due to inhalation of food and vomit: Secondary | ICD-10-CM | POA: Diagnosis not present

## 2019-03-09 DIAGNOSIS — R05 Cough: Secondary | ICD-10-CM

## 2019-03-09 DIAGNOSIS — K3184 Gastroparesis: Secondary | ICD-10-CM | POA: Diagnosis not present

## 2019-03-09 DIAGNOSIS — R1312 Dysphagia, oropharyngeal phase: Secondary | ICD-10-CM | POA: Diagnosis not present

## 2019-03-09 DIAGNOSIS — I129 Hypertensive chronic kidney disease with stage 1 through stage 4 chronic kidney disease, or unspecified chronic kidney disease: Secondary | ICD-10-CM | POA: Diagnosis not present

## 2019-03-09 DIAGNOSIS — D649 Anemia, unspecified: Secondary | ICD-10-CM | POA: Diagnosis not present

## 2019-03-09 DIAGNOSIS — B370 Candidal stomatitis: Secondary | ICD-10-CM | POA: Diagnosis not present

## 2019-03-09 DIAGNOSIS — J189 Pneumonia, unspecified organism: Secondary | ICD-10-CM

## 2019-03-09 DIAGNOSIS — R059 Cough, unspecified: Secondary | ICD-10-CM

## 2019-03-09 DIAGNOSIS — S32591S Other specified fracture of right pubis, sequela: Secondary | ICD-10-CM | POA: Diagnosis not present

## 2019-03-09 DIAGNOSIS — N138 Other obstructive and reflux uropathy: Secondary | ICD-10-CM | POA: Diagnosis not present

## 2019-03-09 DIAGNOSIS — M810 Age-related osteoporosis without current pathological fracture: Secondary | ICD-10-CM | POA: Diagnosis not present

## 2019-03-09 DIAGNOSIS — I10 Essential (primary) hypertension: Secondary | ICD-10-CM | POA: Diagnosis not present

## 2019-03-09 DIAGNOSIS — F411 Generalized anxiety disorder: Secondary | ICD-10-CM | POA: Diagnosis not present

## 2019-03-09 DIAGNOSIS — Z9181 History of falling: Secondary | ICD-10-CM | POA: Diagnosis not present

## 2019-03-09 DIAGNOSIS — E43 Unspecified severe protein-calorie malnutrition: Secondary | ICD-10-CM | POA: Diagnosis not present

## 2019-03-09 DIAGNOSIS — M6281 Muscle weakness (generalized): Secondary | ICD-10-CM | POA: Diagnosis not present

## 2019-03-09 DIAGNOSIS — G8929 Other chronic pain: Secondary | ICD-10-CM | POA: Diagnosis not present

## 2019-03-09 LAB — GLUCOSE, CAPILLARY
Glucose-Capillary: 272 mg/dL — ABNORMAL HIGH (ref 70–99)
Glucose-Capillary: 285 mg/dL — ABNORMAL HIGH (ref 70–99)
Glucose-Capillary: 115 mg/dL — ABNORMAL HIGH (ref 70–99)

## 2019-03-09 MED ORDER — INSULIN LISPRO (1 UNIT DIAL) 100 UNIT/ML (KWIKPEN): 0.0000 [IU] | PEN_INJECTOR | Freq: Three times a day (TID) | SUBCUTANEOUS | 0 refills | Status: DC

## 2019-03-09 MED ORDER — AMOXICILLIN-POT CLAVULANATE 875-125 MG PO TABS
1.0000 | ORAL_TABLET | Freq: Two times a day (BID) | ORAL | Status: DC
  Administered 2019-03-09: 11:00:00 1 via ORAL
  Filled 2019-03-09: qty 1

## 2019-03-09 MED ORDER — INSULIN ASPART 100 UNIT/ML ~~LOC~~ SOLN: 0.0000 [IU] | Freq: Three times a day (TID) | SUBCUTANEOUS | Status: DC

## 2019-03-09 MED ORDER — INSULIN DETEMIR 100 UNIT/ML ~~LOC~~ SOLN
3.0000 [IU] | Freq: Two times a day (BID) | SUBCUTANEOUS | Status: DC
  Administered 2019-03-09: 09:00:00 3 [IU] via SUBCUTANEOUS
  Filled 2019-03-09 (×3): qty 0.03

## 2019-03-09 MED ORDER — AMOXICILLIN-POT CLAVULANATE 875-125 MG PO TABS: 1.0000 | ORAL_TABLET | Freq: Two times a day (BID) | ORAL | 0 refills | Status: DC

## 2019-03-09 NOTE — Progress Notes (Signed)
Patient is discharging to SNF. [facility] is here for transport. All belongings sent with patient. Discharge paperwork sent with patient. IV taken out. Patient transferred with foley. Nurse has called report to Suanne Marker, RN at receiving facility. Family has been notified. [place]

## 2019-03-09 NOTE — Progress Notes (Signed)
Physical Therapy Treatment Patient Details Name: Eduardo Lee MRN: [id number] DOB: 07-Aug-1926 Today's Date: 03/31/2019    History of Present Illness Pt is a 85 y.o. M with significant PMH of DVT, hypertension, legally blind, dementia who was brought in for generalized weakness and confusion. Head CT showing left subdural hematoma.     PT Comments    Pt performed functional mobility with decreased cueing and responded much better.  Pt is requiring decreased assistance throughout session.  He remains to require close chair follow for progression of gait training.  Continue to recommend SNF placement for d/c to improve strength and function before returning home.  Plan next session for progression of gait training.    Follow Up Recommendations  SNF;Supervision/Assistance - 24 hour     Equipment Recommendations  None recommended by PT    Recommendations for Other Services       Precautions / Restrictions Precautions Precautions: Fall Precaution Comments: Pt is blind Restrictions Weight Bearing Restrictions: No    Mobility  Bed Mobility Overal bed mobility: Needs Assistance       Supine to sit: Min assist     General bed mobility comments: Pt able to progress LEs to edge of bed with min assistance for trunk elevation.  Pt required increase time and commands to scoot forward to prepare for sit to stand transfer.    Transfers Overall transfer level: Needs assistance Equipment used: Rolling walker (2 wheeled) Transfers: Sit to/from Stand Sit to Stand: Min assist;Min guard         General transfer comment: Min guard to come to standing and min assistance to return to seated surface.  Pt required cues for hand placement to and from seated surface.    Ambulation/Gait Ambulation/Gait assistance: Mod assist;+2 safety/equipment(for close chair follow) Gait Distance (Feet): 40 Feet Assistive device: Rolling walker (2 wheeled) Gait Pattern/deviations: Trunk  flexed;Shuffle;Steppage;Decreased stride length     General Gait Details: Cues for upper trunk control and to step closer to device.  Pt responds slowly to cues but remains to push RW too far.     Stairs             Wheelchair Mobility    Modified Rankin (Stroke Patients Only)       Balance Overall balance assessment: Needs assistance   Sitting balance-Leahy Scale: Fair Sitting balance - Comments: Able to maintain sitting balance edge of bed unassisted.       Standing balance-Leahy Scale: Poor Standing balance comment: reliant on external support and RW use.                              Cognition Arousal/Alertness: Awake/alert Behavior During Therapy: WFL for tasks assessed/performed Overall Cognitive Status: History of cognitive impairments - at baseline                                 General Comments: hx of dementia, able to follow simple 1 step commands with increased time      Exercises      General Comments        Pertinent Vitals/Pain Pain Assessment: Faces(Pre tx reports no pain, post reports pain in R dorsal aspect of hand when nurse palpated.  ) Faces Pain Scale: Hurts a little bit    Home Living  Prior Function            PT Goals (current goals can now be found in the care plan section) Acute Rehab PT Goals Patient Stated Goal: none stated Potential to Achieve Goals: Fair Progress towards PT goals: Progressing toward goals    Frequency    Min 3X/week      PT Plan Current plan remains appropriate    Co-evaluation              AM-PAC PT "6 Clicks" Mobility   Outcome Measure  Help needed turning from your back to your side while in a flat bed without using bedrails?: A Little Help needed moving from lying on your back to sitting on the side of a flat bed without using bedrails?: A Little Help needed moving to and from a bed to a chair (including a wheelchair)?: A  Little Help needed standing up from a chair using your arms (e.g., wheelchair or bedside chair)?: A Little Help needed to walk in hospital room?: A Lot Help needed climbing 3-5 steps with a railing? : Total 6 Click Score: 15    End of Session Equipment Utilized During Treatment: Gait belt Activity Tolerance: Patient tolerated treatment well Patient left: with nursing/sitter in room;in chair;with call bell/phone within reach;with chair alarm set Nurse Communication: Mobility status PT Visit Diagnosis: Unsteadiness on feet (R26.81);Other abnormalities of gait and mobility (R26.89);Muscle weakness (generalized) (M62.81)     Time: 4076-8088 PT Time Calculation (min) (ACUTE ONLY): 22 min  Charges:  $Gait Training: 8-22 mins                     Governor Rooks, PTA Acute Rehabilitation Services Pager [phone] Office [phone]     Raisa Ditto Eli Hose 03/31/2019, 9:23 AM

## 2019-03-09 NOTE — Progress Notes (Signed)
Pt is coughing after giving 1 of oral potassiums. Nurse only have half of ordered dose due to patient coughing. Dr. Ree Kida notifed. Dr. Ree Kida instructed to hold other potassium. Nurse will continue to monitor. [place]

## 2019-03-09 NOTE — TOC Transition Note (Signed)
Transition of Care [facility]) - CM/SW Discharge Note   Patient Details  Name: Eduardo Lee MRN: [id number] Date of Birth: 21-May-1926  [facility] of [facility]) CM/SW Contact:  Geralynn Ochs, LCSW Phone Number: 03/31/2019, 11:06 AM   Clinical Narrative:   Nurse to call report to [phone], Room 155      Final next level of care: Skilled Nursing Facility Barriers to Discharge: No Barriers Identified   Patient Goals and CMS Choice Patient states their goals for this hospitalization and ongoing recovery are:: patient unable to participate in goal setting CMS Medicare.gov Compare Post Acute Care list provided to:: Patient Represenative (must comment) Choice offered to / list presented to : Adult Children  Discharge Placement              Patient chooses bed at: [facility] Patient to be transferred to facility by: [facility] Name of family member notified: [name] Patient and family notified of of transfer: 03/26/2019  Discharge Plan and Services                          Social Determinants of Health (SDOH) Interventions     Readmission Risk Interventions No flowsheet data found.

## 2019-03-09 NOTE — Progress Notes (Signed)
Pt took off [place] and fiddled with it. Pt on room air with good oxygen sat.

## 2019-03-10 ENCOUNTER — Encounter: Payer: Self-pay | Admitting: Internal Medicine

## 2019-03-10 ENCOUNTER — Non-Acute Institutional Stay (SKILLED_NURSING_FACILITY): Payer: Medicare Other | Admitting: Internal Medicine

## 2019-03-10 ENCOUNTER — Other Ambulatory Visit: Payer: Self-pay

## 2019-03-10 DIAGNOSIS — E1065 Type 1 diabetes mellitus with hyperglycemia: Secondary | ICD-10-CM | POA: Diagnosis not present

## 2019-03-10 DIAGNOSIS — E1122 Type 2 diabetes mellitus with diabetic chronic kidney disease: Secondary | ICD-10-CM | POA: Diagnosis not present

## 2019-03-10 DIAGNOSIS — I6200 Nontraumatic subdural hemorrhage, unspecified: Secondary | ICD-10-CM

## 2019-03-10 DIAGNOSIS — Z86711 Personal history of pulmonary embolism: Secondary | ICD-10-CM

## 2019-03-10 DIAGNOSIS — J690 Pneumonitis due to inhalation of food and vomit: Secondary | ICD-10-CM

## 2019-03-10 DIAGNOSIS — D649 Anemia, unspecified: Secondary | ICD-10-CM | POA: Diagnosis not present

## 2019-03-10 DIAGNOSIS — N183 Chronic kidney disease, stage 3 (moderate): Secondary | ICD-10-CM

## 2019-03-10 NOTE — Progress Notes (Signed)
Location:  [facility] Room Number: 155 P Place of Service:  SNF [phone]) Provider:  Granville Lewis, PA-C  Eduardo Kirschner, MD  Patient Care Team: Eduardo Kirschner, MD as PCP - General  Extended Emergency Contact Information Primary Emergency Contact: Lee,Eduardo Address: [address], [place] [address] [place] Phone: [phone] Mobile Phone: [phone] Relation: Son Secondary Emergency Contact: Eduardo Lee, [place] [address] Eduardo Lee of [place] Phone: [phone] Relation: Son  Code Status:  DNR Goals of care: Advanced Directive information Advanced Directives 03/10/2019  Does Patient Have a Medical Advance Directive? Yes  Type of Advance Directive Out of facility DNR (pink MOST or yellow form)  Does patient want to make changes to medical advance directive? No - Patient declined  Copy of [place] in Chart? -  Pre-existing out of facility DNR order (yellow form or pink MOST form) -     Chief Complaint  Patient presents with  . Hospitalization Follow-up    Hospital Follow up    HPI:  Pt is a 85 y.o. male seen today for a hospital f/u for a subdural versus epidural hemorrhage.  Patient has a history of DVT has been on chronic anticoagulation with Xarelto as well as hypertension legally blind mild dementia.  Who is brought to the hospital with generalized weakness and confusion.  CT of the head showed a subdural or epidural hemorrhage along the left temporal lobe.  Neurological notation was made with Dr. Arnoldo Morale and Eppie Gibson to reverse the effects of Xarelto.  So no surgery was needed at this time for further work-up- with recommendation to follow-up with primary care provider for future anticoagulation currently he is off the Xarelto family does not wish brain surgery but open to a brain whole procedure to drain bleed or hematoma.  Hemoglobin showed stability at 11.3  prior to discharge in regards to DVT PE in the past again his Xarelto is being held.  He also is a type I diabetic blood sugars so far have been more in the mid 100s occasionally 200 range.  His other medical issues appear to be stable improvement at 11.3 before discharge creatinine is baseline.  He did have mild hypokalemia which was supplemented  He is also on a course of Augmentin for aspiration pneumonia which apparently occurred prior to discharge- apparently he was getting his oral potassium and aspirated hypoxia that resolved with oxygen.  He has been weaned off his oxygen.  He also got a dose of Lasix  Continues to be afebrile and does not complain of shortness of breath       Past Medical History:  Diagnosis Date  . Ataxia   . Chronic anticoagulation   . Diabetes mellitus   . DVT (deep venous thrombosis) (HCC)    recurrent  . Falls   . Gastroparesis   . Hypertension   . Legally blind    diabetic retinopathy  . Mild dementia ([facility])   . Neuropathy   . Osteoporosis   . Pulmonary embolism ([facility]) 12/2010   on blood thinners  . Stroke ([place])   . Weight loss    Past Surgical History:  Procedure Laterality Date  . ANKLE SURGERY     Patient states that he had pins place in the left foot  . BACK SURGERY  2008  . CARPAL TUNNEL RELEASE  08/2011   Left hand  . COLONOSCOPY  01/2011  . ORIF HIP FRACTURE  10/31/2011   Procedure: OPEN REDUCTION INTERNAL FIXATION HIP;  Surgeon: Arther Abbott, MD;  Location: [facility];  Service: Orthopedics;  Laterality: Right;  with Gamma Nail  . TRANSURETHRAL RESECTION OF PROSTATE  1984  . UPPER GASTROINTESTINAL ENDOSCOPY  01/2011    No Known Allergies  Outpatient Encounter Medications as of 03/10/2019  Medication Sig  . amoxicillin-clavulanate (AUGMENTIN) 875-125 MG tablet Take 1 tablet by mouth every 12 (twelve) hours.  . citalopram (CELEXA) 40 MG tablet Take 40 mg by mouth daily.  . clonazePAM (KLONOPIN) 0.5 MG tablet Take 1 tablet  (0.5 mg total) by mouth 2 (two) times daily as needed (agitiation).  . ezetimibe (ZETIA) 10 MG tablet Take 10 mg by mouth at bedtime.  . insulin lispro (HUMALOG KWIKPEN) 100 UNIT/ML KwikPen Inject 0-0.06 mLs (0-6 Units total) into the skin 3 (three) times daily. CBG 151 - 200 (dose in units): 2 CBG 201 - 250 (dose in units): 3 CBG 251 - 300 (dose in units): 4 CBG 301 - 350 (dose in units): 5 CBG 351 - 400 (dose in units): 6  . latanoprost (XALATAN) 0.005 % ophthalmic solution Place 1 drop into both eyes at bedtime.   . NON FORMULARY Diet Type:  Downgrade patient to dysphagia 3 chopped, continue thin liquids  Consistent CHO snack at bedtime  . polyethylene glycol powder (GLYCOLAX/MIRALAX) powder Take 34 g by mouth every morning.  . tamsulosin (FLOMAX) 0.4 MG CAPS capsule Take 0.4 mg by mouth daily.  . TRESIBA FLEXTOUCH 100 UNIT/ML SOPN FlexTouch Pen Inject subcutaneous at bedtime as per sliding scale: If blood sugar is less than 60, call MD 60 - 300 - give 4 units Greater than 300 = 6 units Greater than 400 = Call MD  . vitamin B-12 (CYANOCOBALAMIN) 1000 MCG tablet Take 1,000 mcg by mouth every morning.   . Vitamin D, Ergocalciferol, (DRISDOL) 50000 UNITS CAPS Take 50,000 Units by mouth every Wednesday.   . [DISCONTINUED] citalopram (CELEXA) 40 MG tablet Take 1 tablet (40 mg total) by mouth daily. (Patient not taking: Reported on 03/10/2019)  . [DISCONTINUED] tamsulosin (FLOMAX) 0.4 MG CAPS capsule Take 1 capsule (0.4 mg total) by mouth daily. (Patient not taking: Reported on 03/10/2019)  . [DISCONTINUED] ZETIA 10 MG tablet TAKE 1 TABLET(10 MG) BY MOUTH AT BEDTIME (Patient not taking: Reported on 03/10/2019)   No facility-administered encounter medications on file as of 03/10/2019.     Review of Systems   This is somewhat limited secondary to dementia.  In general he is not complaining chills.  Skin does not complain of itching or rashes he does have fragile skin does have covering over his  right wrist lower arm area as well as active covering over his left lower arm.  Eyes he is legally blind.  Ears nose mouth and throat does not complain of sore throat.  Respiratory does not complain of shortness of breath at this time or cough.  Cardiac does not complain of chest pain or edema.  GI is not complaining of abdominal discomfo  t GU he does have an indwelling Foley catheter draining at this point does not complain of dysuria  Musculoskeletal at this point does not complain of joint pain has general frailty.  Neurologic as noted above Does not complain of syncope headache or dizziness    psych not complain of being depressed or anxious he is on Celexa I note as  well as clonazepam  Immunization History  Administered Date(s) Administered  . Influenza Split 08/31/2013  . Influenza,inj,Quad PF,6+ Mos 09/07/2014, 09/12/2015  . Influenza-Unspecified 09/02/2011, 10/02/2016, 08/28/2017, 09/11/2018  . Pneumococcal Conjugate-13 06/07/2014  . Pneumococcal Polysaccharide-23 09/01/2001  . Td 06/01/2009  . Tdap 04/26/2016  . Zoster 05/23/2009   Pertinent  Health Maintenance Due  Topic Date Due  . URINE MICROALBUMIN  04/09/2019 (Originally 02/20/1936)  . OPHTHALMOLOGY EXAM  04/29/2019  . FOOT EXAM  06/10/2019  . INFLUENZA VACCINE  07/03/2019  . HEMOGLOBIN A1C  09/03/2019  . PNA vac Low Risk Adult  Completed   Fall Risk  01/12/2018 11/06/2017 06/10/2016  Falls in the past year? Yes Yes Yes  Comment - [facility] Telephone Survey: data to providers prior to load -  Number falls in past yr: 1 1 1   Comment - Emmi Telephone Survey Actual Response = 1 -  Injury with Fall? Yes Yes Yes  Risk Factor Category  High Fall Risk - High Fall Risk  Risk for fall due to : - - Impaired vision  Follow up - - Falls evaluation completed;Education provided;Falls prevention discussed   Functional Status Survey:    Vitals:   03/10/19 0840  BP: (!) 144/56  Pulse: 82  Resp: 20  Temp: 98.9 F (37.2  C)  Weight: 133 lb 6.4 oz (60.5 kg)  Height: 5\' 7"  (1.702 m)   Body mass index is 20.89 kg/m. Physical Exam  In general this is a pleasant elderly male in no distress sitting comfortably in his wheelchair.  His skin is warm and Dry he does have bandaging over his right lower arm he also has protective covering over her left lower arm.  Eyes sclera and conjunctive are clear again he is legally blind.  Oropharynx is clear mucous membranes moist.  Chest he has shallow air entry could not appreciate any overt congestion- there is no labored breathing.  Heart is regular rate and rhythm with occasional irregular beats he does not have significant lower extremity edema.  Abdomen is soft nontender with positive bowel sounds.  Musculoskeletal has generalized frailty is able to move all extremities x4   Neurologic cannot appreciate lateralizing findings speech is clear but he does not speak a whole lot  Psych he is oriented to self is pleasant appropriate  Labs reviewed: Recent Labs    03/05/19 0302 03/06/19 0235 03/07/19 0426  NA 136 136 138  K 3.6 3.4* 4.7  CL 100 102 105  CO2 21* 21* 21*  GLUCOSE 218* 276* 256*  BUN 20 18 17   CREATININE 1.31* 1.28* 1.03  CALCIUM 8.6* 8.5* 8.4*   Recent Labs    03/03/19 2046 03/04/19 0548  AST 15 13*  ALT 13 13  ALKPHOS 120 109  BILITOT 0.8 1.3*  PROT 6.5 5.7*  ALBUMIN 3.5 2.9*   Recent Labs    03/03/19 2046 03/04/19 0548 03/05/19 0302 03/06/19 0235 03/07/19 0426  WBC 15.9* 11.9*  --   --   --   NEUTROABS 13.6*  --   --   --   --   HGB 12.1* 10.8* 10.0* 9.9* 11.3*  HCT 37.7* 32.7* 31.7* 31.0* 35.6*  MCV 92.2 92.1  --   --   --   PLT 336 323  --   --   --    No results found for: TSH Lab Results  Component Value Date   HGBA1C 7.8 (H) 03/04/2019   Lab Results  Component Value Date   CHOL  136 05/20/2017   HDL 61 05/20/2017   LDLCALC 66 05/20/2017   TRIG 43 05/20/2017   CHOLHDL 2.2 05/20/2017    Significant  Diagnostic Results in last 30 days:  No results found.  Assessment/Plan  #1 history of subdural versus epidural hemorrhage- has been evaluated by neurosurgery at this point no surgery is planned- his Xarelto is currently being held.  He will need continued PT and OT.  Family is open to drain bleed if needed   But at this point no surgery is planned   2 history of DVT and PE in the past again his Xarelto is being held secondary to the bleed.  3.-  History of type 1 diabetes continues on insulin sliding scale at this point will monitor CBGs so far been more in the mid 100s occasionally above 200.  4.  History of depression continues on Celexa.  5.  History of depression continues on clonazepam as needed twice a day.  6 history of chronic kidney disease appears stable with a creatinine of 1.03 prior to discharge.  7.  History of normocytic anemia this shows stability actually possibly slight improvement at 11.3 on lab before discharge this will need monitoring.  8.  History of hypokalemia this resolved with supplementation this will need updating at some point as well.  9.  History of aspiration pneumonia is on Augmentin.  At this point appears to be stable.  Will await MD input on duration of therapy I suspect this will probably be for around a week.   10-hypertension?  Appears systolics recently have been in the 140-160 range again would like to await  more readings before making any medication changes- would like to minimize medications if possible  #11 history sore on lower right arm -apparently the bandaging is covering a sore on his arm per discussion with nursing tech-I did discuss this with wound care nurse who will evaluate and treat this  Again he will need occasional monitoring of his hemoglobin and renal function will defer to MD to order.  Clinically he appears stable although quite frail.  CPT- [phone]- note   greater than 40 minutes spent assessing  patient-reviewing his chart and labs coordinating and formulating a plan of care for numerous diagnoses- of note greater than 50% of time spent coordinating a plan of care input as noted above.

## 2019-03-11 ENCOUNTER — Non-Acute Institutional Stay (SKILLED_NURSING_FACILITY): Payer: Medicare Other | Admitting: Internal Medicine

## 2019-03-11 ENCOUNTER — Encounter: Payer: Self-pay | Admitting: Internal Medicine

## 2019-03-11 DIAGNOSIS — N183 Chronic kidney disease, stage 3 unspecified: Secondary | ICD-10-CM

## 2019-03-11 DIAGNOSIS — I10 Essential (primary) hypertension: Secondary | ICD-10-CM

## 2019-03-11 DIAGNOSIS — E1065 Type 1 diabetes mellitus with hyperglycemia: Secondary | ICD-10-CM | POA: Diagnosis not present

## 2019-03-11 DIAGNOSIS — Z978 Presence of other specified devices: Secondary | ICD-10-CM | POA: Insufficient documentation

## 2019-03-11 DIAGNOSIS — S065X9A Traumatic subdural hemorrhage with loss of consciousness of unspecified duration, initial encounter: Secondary | ICD-10-CM | POA: Insufficient documentation

## 2019-03-11 DIAGNOSIS — E1122 Type 2 diabetes mellitus with diabetic chronic kidney disease: Secondary | ICD-10-CM

## 2019-03-11 DIAGNOSIS — Z86711 Personal history of pulmonary embolism: Secondary | ICD-10-CM

## 2019-03-11 DIAGNOSIS — H548 Legal blindness, as defined in USA: Secondary | ICD-10-CM | POA: Insufficient documentation

## 2019-03-11 DIAGNOSIS — S065XAA Traumatic subdural hemorrhage with loss of consciousness status unknown, initial encounter: Secondary | ICD-10-CM | POA: Insufficient documentation

## 2019-03-11 DIAGNOSIS — Z960 Presence of urogenital implants: Secondary | ICD-10-CM

## 2019-03-11 NOTE — Progress Notes (Addendum)
Provider: Veleta Miners MD  Location:    [facility] Room Number: 155/P Place of Service:  SNF (31)  PCP: Mikey Kirschner, MD Patient Care Team: Mikey Kirschner, MD as PCP - General  Extended Emergency Contact Information Primary Emergency Contact: Felmlee,Mike Address: [address] [place] [address] [place] Phone: [phone] Mobile Phone: [phone] Relation: Son Secondary Emergency Contact: Livia Snellen, [place] [address] [place] of [place] Phone: [phone] Relation: Son  Code Status: DNR Goals of Care: Advanced Directive information Advanced Directives 03/11/2019  Does Patient Have a Medical Advance Directive? Yes  Type of Advance Directive Out of facility DNR (pink MOST or yellow form)  Does patient want to make changes to medical advance directive? No - Patient declined  Copy of [place] in Chart? -  Pre-existing out of facility DNR order (yellow form or pink MOST form) -      Chief Complaint  Patient presents with  . New Admit To SNF    New Admission Visit    HPI: Patient is a 85 y.o. male seen today for admission to SNF for therapy Patient was admitted in the hospital from 04/01-04/07 for epidural hemorrhage with weakness and confusion. Patient who is legally Blind has a history of type 1 diabetes, chronic DVT and a history of PE on anticoagulation with Xarelto, hypertension, history of recurrent falls, anemia, hyperlipidemia, CKD stage III, neuropathy, osteoporosis And Has Chronic Foley Catheter  Patient was brought to the hospital by the family because of generalized weakness and confusion.  Per family patient could stand to pivot to transfer but was unable to do that at home.  Per his PCP notes patient has been getting weaker over past few months and family has been struggling to take care of him. Patient was unable to give me any detailed history.  He said  that he has been feeling weak and still feels weak.  According to the patient he was able to get up and walk with a walker and go to the bathroom by himself at home.  In the hospital the CT scan showed left temporal lobe subdural/epidural hemorrhage.  Per neurosurgery no further work-up was needed.  His Xarelto was reversed with Eppie Gibson and was stopped. He also had an episode of aspiration in the hospital.  He was treated with Lasix and Augmentin. He is now in SNF for therapy.  His only complaint was weakness.  He does have bruises all over his body and a big bruise on his right hand.  He denies any shortness of breath but does have cough.  Denies any shortness of breath, chest pain, fever. As above patient lives with his son and also has a house in [place] and at [employer] where he goes for the summer with his girlfriend.   Past Medical History:  Diagnosis Date  . Ataxia   . Chronic anticoagulation   . Diabetes mellitus   . DVT (deep venous thrombosis) (HCC)    recurrent  . Falls   . Gastroparesis   . Hypertension   . Legally blind    diabetic retinopathy  . Mild dementia ([facility])   . Neuropathy   . Osteoporosis   . Pulmonary embolism ([facility]) 12/2010   on blood thinners  . Stroke ([facility])   . Weight loss    Past Surgical  History:  Procedure Laterality Date  . ANKLE SURGERY     Patient states that he had pins place in the left foot  . BACK SURGERY  2008  . CARPAL TUNNEL RELEASE  08/2011   Left hand  . COLONOSCOPY  01/2011  . ORIF HIP FRACTURE  10/31/2011   Procedure: OPEN REDUCTION INTERNAL FIXATION HIP;  Surgeon: Arther Abbott, MD;  Location: [facility];  Service: Orthopedics;  Laterality: Right;  with Gamma Nail  . TRANSURETHRAL RESECTION OF PROSTATE  1984  . UPPER GASTROINTESTINAL ENDOSCOPY  01/2011    reports that he quit smoking about 37 years ago. His smoking use included cigars. He quit after 20.00 years of use. He has never used smokeless tobacco. He reports that he  does not drink alcohol or use drugs. Social History   Socioeconomic History  . Marital status: Divorced    Spouse name: Not on file  . Number of children: Not on file  . Years of education: Not on file  . Highest education level: Not on file  Occupational History  . Not on file  Social Needs  . Financial resource strain: Not on file  . Food insecurity:    Worry: Not on file    Inability: Not on file  . Transportation needs:    Medical: Not on file    Non-medical: Not on file  Tobacco Use  . Smoking status: Former Smoker    Years: 20.00    Types: Cigars    Last attempt to quit: 10/27/1981    Years since quitting: 37.3  . Smokeless tobacco: Never Used  Substance and Sexual Activity  . Alcohol use: No    Alcohol/week: 0.0 standard drinks    Comment: Drinks Scotch 3 times per week prior to [employer]- hasn't drank x 1 month  . Drug use: No  . Sexual activity: Never  Lifestyle  . Physical activity:    Days per week: Not on file    Minutes per session: Not on file  . Stress: Not on file  Relationships  . Social connections:    Talks on phone: Not on file    Gets together: Not on file    Attends religious service: Not on file    Active member of club or organization: Not on file    Attends meetings of clubs or organizations: Not on file    Relationship status: Not on file  . Intimate partner violence:    Fear of current or ex partner: Not on file    Emotionally abused: Not on file    Physically abused: Not on file    Forced sexual activity: Not on file  Other Topics Concern  . Not on file  Social History Narrative   Pt lives in 1 story home with his son and sons family   Has 3 adult children   High school graduate   Retired Event organiser Status Survey:    Family History  Problem Relation Age of Onset  . Diabetes Mother   . Diabetes Father   . Diabetes Sister   . Diabetes Brother   . Diabetes Sister   . Diabetes Son   . Healthy Son   .  Healthy Son     Health Maintenance  Topic Date Due  . URINE MICROALBUMIN  04/09/2019 (Originally 02/20/1936)  . OPHTHALMOLOGY EXAM  04/29/2019  . FOOT EXAM  06/10/2019  . INFLUENZA VACCINE  07/03/2019  . HEMOGLOBIN A1C  09/03/2019  .  TETANUS/TDAP  04/26/2026  . PNA vac Low Risk Adult  Completed    No Known Allergies  Outpatient Encounter Medications as of 03/11/2019  Medication Sig  . amoxicillin-clavulanate (AUGMENTIN) 875-125 MG tablet Take 1 tablet by mouth every 12 (twelve) hours.  Roseanne Kaufman [place]-Castor Oil (VENELEX) OINT Apply liberal amount to sacrum & bilateral buttocks qshift & prn incotinence for prevention  . citalopram (CELEXA) 40 MG tablet Take 40 mg by mouth daily.  . clonazePAM (KLONOPIN) 0.5 MG tablet Take 1 tablet (0.5 mg total) by mouth 2 (two) times daily as needed (agitiation).  . ezetimibe (ZETIA) 10 MG tablet Take 10 mg by mouth at bedtime.  . insulin lispro (HUMALOG KWIKPEN) 100 UNIT/ML KwikPen Inject 0-0.06 mLs (0-6 Units total) into the skin 3 (three) times daily. CBG 151 - 200 (dose in units): 2 CBG 201 - 250 (dose in units): 3 CBG 251 - 300 (dose in units): 4 CBG 301 - 350 (dose in units): 5 CBG 351 - 400 (dose in units): 6  . latanoprost (XALATAN) 0.005 % ophthalmic solution Place 1 drop into both eyes at bedtime.   . NON FORMULARY Diet Type:  Downgrade patient to dysphagia 3 chopped, continue thin liquids  Consistent CHO snack at bedtime  . polyethylene glycol powder (GLYCOLAX/MIRALAX) powder Take 34 g by mouth every morning.  . tamsulosin (FLOMAX) 0.4 MG CAPS capsule Take 0.4 mg by mouth daily.  . TRESIBA FLEXTOUCH 100 UNIT/ML SOPN FlexTouch Pen Inject subcutaneous at bedtime as per sliding scale: If blood sugar is less than 60, call MD 60 - 300 - give 4 units Greater than 300 = 6 units Greater than 400 = Call MD  . vitamin B-12 (CYANOCOBALAMIN) 1000 MCG tablet Take 1,000 mcg by mouth every morning.   . Vitamin D, Ergocalciferol, (DRISDOL) 50000 UNITS  CAPS Take 50,000 Units by mouth every Wednesday.    No facility-administered encounter medications on file as of 03/11/2019.      Review of Systems  Constitutional: Positive for activity change.  HENT: Negative.   Respiratory: Positive for cough.   Cardiovascular: Negative.   Gastrointestinal: Positive for constipation.  Genitourinary: Negative.   Musculoskeletal: Negative.   Skin: Positive for color change and wound.  Neurological: Positive for weakness.  Psychiatric/Behavioral: Positive for dysphoric mood.    Vitals:   03/11/19 1354  BP: (!) 106/59  Pulse: 69  Resp: 18  Temp: 97.9 F (36.6 C)   There is no height or weight on file to calculate BMI. Physical Exam Vitals signs reviewed.  HENT:     Head: Normocephalic.     Nose: Nose normal.     Mouth/Throat:     Comments: Mild thrush Eyes:     Pupils: Pupils are equal, round, and reactive to light.     Comments: Legally Blind  Cardiovascular:     Rate and Rhythm: Normal rate and regular rhythm.     Pulses: Normal pulses.     Heart sounds: Normal heart sounds.  Pulmonary:     Effort: Pulmonary effort is normal. No respiratory distress.     Breath sounds: Normal breath sounds. No rales.  Abdominal:     General: Abdomen is flat. Bowel sounds are normal.     Palpations: Abdomen is soft.  Musculoskeletal:        General: No swelling.     Comments: Has Bruises all over his Extremities Has Big Hematoma on his Right Hand With discharge  Skin:    Comments: Bruises and  Hematomas  Neurological:     General: No focal deficit present.     Mental Status: He is alert and oriented to person, place, and time.  Psychiatric:        Attention and Perception: Attention normal.        Mood and Affect: Mood is depressed.        Speech: Speech is delayed.     Labs reviewed: Basic Metabolic Panel: Recent Labs    03/05/19 0302 03/06/19 0235 03/07/19 0426  NA 136 136 138  K 3.6 3.4* 4.7  CL 100 102 105  CO2 21* 21* 21*   GLUCOSE 218* 276* 256*  BUN 20 18 17   CREATININE 1.31* 1.28* 1.03  CALCIUM 8.6* 8.5* 8.4*   Liver Function Tests: Recent Labs    03/03/19 2046 03/04/19 0548  AST 15 13*  ALT 13 13  ALKPHOS 120 109  BILITOT 0.8 1.3*  PROT 6.5 5.7*  ALBUMIN 3.5 2.9*   No results for input(s): LIPASE, AMYLASE in the last 8760 hours. No results for input(s): AMMONIA in the last 8760 hours. CBC: Recent Labs    03/03/19 2046 03/04/19 0548 03/05/19 0302 03/06/19 0235 03/07/19 0426  WBC 15.9* 11.9*  --   --   --   NEUTROABS 13.6*  --   --   --   --   HGB 12.1* 10.8* 10.0* 9.9* 11.3*  HCT 37.7* 32.7* 31.7* 31.0* 35.6*  MCV 92.2 92.1  --   --   --   PLT 336 323  --   --   --    Cardiac Enzymes: Recent Labs    03/03/19 2046  TROPONINI <0.03   BNP: Invalid input(s): POCBNP Lab Results  Component Value Date   HGBA1C 7.8 (H) 03/04/2019   No results found for: TSH Lab Results  Component Value Date   VITAMINB12 398 05/20/2017   No results found for: FOLATE No results found for: IRON, TIBC, FERRITIN  Imaging and Procedures obtained prior to SNF admission: No results found.  Assessment/Plan  Subdural hematoma  Xarelto on hold Will Need repeat CT scan on 4 weeks Continue To monitor him Clinically His weakness seem to be generalized decline and he will benefit from Long term placement Therapy will work with him  Type 1 diabetes mellitus with hyperglycemia  Follows Closely with Dr Altheimer Will continue home dose and [facility] checks On Sliding scale and Low dose of [employer]  CKD stage 3  Creat Stblae  History of pulmonary embolism./ DVT Xarelto on Hold Will continue to follow closely  Chronic indwelling Foley catheter Supportive care On Flomax  Legally blind Aspiration Pneumonia Is on Augmentin Will continue for 7 days as he has this Hematoma and has Cellulitis  on right hand  Right Hand Hematoma with Cellulitis D/W the nurse plan For Xeroform dressing Augmentin will  be continued for 1 week  Depression with Anxiety Continue on Celexa and Klonopin Anemia Repeat CBC Oral Thrush Started on Nystatin Family/ staff Communication:   Labs/tests ordered:  Total time spent in this patient care encounter was 45_ minutes; greater than 50% of the visit spent counseling patient, reviewing records , Labs and coordinating care for problems addressed at this encounter.

## 2019-03-15 ENCOUNTER — Encounter: Payer: Self-pay | Admitting: Internal Medicine

## 2019-03-15 ENCOUNTER — Inpatient Hospital Stay (HOSPITAL_COMMUNITY): Payer: Medicare Other | Attending: Internal Medicine

## 2019-03-15 ENCOUNTER — Telehealth: Payer: Self-pay | Admitting: Radiology

## 2019-03-15 ENCOUNTER — Non-Acute Institutional Stay (SKILLED_NURSING_FACILITY): Payer: Medicare Other | Admitting: Internal Medicine

## 2019-03-15 DIAGNOSIS — H548 Legal blindness, as defined in USA: Secondary | ICD-10-CM

## 2019-03-15 DIAGNOSIS — E1065 Type 1 diabetes mellitus with hyperglycemia: Secondary | ICD-10-CM

## 2019-03-15 DIAGNOSIS — S32591A Other specified fracture of right pubis, initial encounter for closed fracture: Secondary | ICD-10-CM | POA: Diagnosis not present

## 2019-03-15 DIAGNOSIS — I6200 Nontraumatic subdural hemorrhage, unspecified: Secondary | ICD-10-CM

## 2019-03-15 NOTE — Progress Notes (Signed)
Location:    [facility] Room Number: 155/P Place of Service:  SNF [phone]) Provider:  Veleta Miners MD  Mikey Kirschner, MD  Patient Care Team: Mikey Kirschner, MD as PCP - General  Extended Emergency Contact Information Primary Emergency Contact: Nanninga,Mike Address: [address], [place] [address] [place] Phone: [phone] Mobile Phone: [phone] Relation: Son Secondary Emergency Contact: Livia Snellen, [place] [address] [place] of [place] Phone: [phone] Relation: Son  Code Status:  DNR Goals of care: Advanced Directive information Advanced Directives 03/15/2019  Does Patient Have a Medical Advance Directive? Yes  Type of Advance Directive Out of facility DNR (pink MOST or yellow form)  Does patient want to make changes to medical advance directive? No - Patient declined  Copy of [place] in Chart? -  Pre-existing out of facility DNR order (yellow form or pink MOST form) -     Chief Complaint  Patient presents with   Acute Visit    Inferiro Ramus Fracture    HPI:  Pt is a 85 y.o. male seen today for an acute visit for Right Pubic Rami Fracture after a fall in the facility. Patient is in facility for Therapy Patient was admitted in the hospital from 04/01-04/07 for epidural hemorrhage with weakness and confusion. Patient who is legally Blind has a history of type 1 diabetes, chronic DVT and a history of PE on anticoagulation with Xarelto, hypertension, history of recurrent falls, anemia, hyperlipidemia, CKD stage III, neuropathy, osteoporosis And Has Chronic Foley Catheter  Patient was brought to the hospital by the family because of generalized weakness and confusion.  In the hospital the CT scan showed left temporal lobe subdural/epidural hemorrhage.  Per neurosurgery no further work-up was needed.  His Xarelto was reversed with Eppie Gibson and was stopped. He also  had an episode of aspiration in the hospital.  He was treated with Lasix and Augmentin. He fell in the facility on the weekend. Today he was c/o Right Hip pain. We got Stat Xray and it shows Right non displaced Inferior Pubic Ramus Fracture. He denied any pain to me today. Does not remember how he fell.  Past Medical History:  Diagnosis Date   Ataxia    Chronic anticoagulation    Diabetes mellitus    DVT (deep venous thrombosis) (HCC)    recurrent   Falls    Gastroparesis    Hypertension    Legally blind    diabetic retinopathy   Mild dementia ([place])    Neuropathy    Osteoporosis    Pulmonary embolism ([place]) 12/2010   on blood thinners   Stroke ([place])    Weight loss    Past Surgical History:  Procedure Laterality Date   ANKLE SURGERY     Patient states that he had pins place in the left foot   BACK SURGERY  2008   CARPAL TUNNEL RELEASE  08/2011   Left hand   COLONOSCOPY  01/2011   ORIF HIP FRACTURE  10/31/2011   Procedure: OPEN REDUCTION INTERNAL FIXATION HIP;  Surgeon: Arther Abbott, MD;  Location: [facility];  Service: Orthopedics;  Laterality: Right;  with Gamma Nail   TRANSURETHRAL RESECTION OF PROSTATE  1984   UPPER GASTROINTESTINAL ENDOSCOPY  01/2011    No Known Allergies  Outpatient Encounter Medications as of 03/15/2019  Medication Sig  amoxicillin-clavulanate (AUGMENTIN) 875-125 MG tablet Take 1 tablet by mouth every 12 (twelve) hours.   Balsam [place]-Castor Oil (VENELEX) OINT Apply liberal amount to sacrum & bilateral buttocks qshift & prn incotinence for prevention   citalopram (CELEXA) 40 MG tablet Take 40 mg by mouth daily.   clonazePAM (KLONOPIN) 0.5 MG tablet Take 1 tablet (0.5 mg total) by mouth 2 (two) times daily as needed (agitiation).   ezetimibe (ZETIA) 10 MG tablet Take 10 mg by mouth at bedtime.   insulin lispro (HUMALOG KWIKPEN) 100 UNIT/ML KwikPen Inject 0-0.06 mLs (0-6 Units total) into the skin 3 (three) times daily. CBG  151 - 200 (dose in units): 2 CBG 201 - 250 (dose in units): 3 CBG 251 - 300 (dose in units): 4 CBG 301 - 350 (dose in units): 5 CBG 351 - 400 (dose in units): 6   latanoprost (XALATAN) 0.005 % ophthalmic solution Place 1 drop into both eyes at bedtime.    NON FORMULARY Diet Type:  Downgrade patient to dysphagia 3 chopped, continue thin liquids  Consistent CHO snack at bedtime   nystatin (MYCOSTATIN) 100000 UNIT/ML suspension Take 5 mLs by mouth. Swish and spit once daily for 7 days   polyethylene glycol (MIRALAX / GLYCOLAX) 17 g packet Take 17 g by mouth. Take once a morning in 6 oz of liquid and take with 4 oz of liquid once a day prn   tamsulosin (FLOMAX) 0.4 MG CAPS capsule Take 0.4 mg by mouth daily.   TRESIBA FLEXTOUCH 100 UNIT/ML SOPN FlexTouch Pen Inject subcutaneous at bedtime as per sliding scale: If blood sugar is less than 60, call MD 60 - 300 - give 4 units Greater than 300 = 6 units Greater than 400 = Call MD   vitamin B-12 (CYANOCOBALAMIN) 1000 MCG tablet Take 1,000 mcg by mouth every morning.    Vitamin D, Ergocalciferol, (DRISDOL) 50000 UNITS CAPS Take 50,000 Units by mouth every Wednesday.    [DISCONTINUED] polyethylene glycol powder (GLYCOLAX/MIRALAX) powder Take 34 g by mouth every morning.   No facility-administered encounter medications on file as of 03/15/2019.      Review of Systems  Constitutional: Positive for activity change.  HENT: Negative.   Respiratory: Positive for cough.   Cardiovascular: Negative.   Gastrointestinal: Positive for constipation.  Genitourinary: Negative.   Musculoskeletal: Negative.   Skin: Positive for color change and wound.  Neurological: Positive for weakness.  Psychiatric/Behavioral: Positive for dysphoric mood.    Immunization History  Administered Date(s) Administered   Influenza Split 08/31/2013   Influenza,inj,Quad PF,6+ Mos 09/07/2014, 09/12/2015   Influenza-Unspecified 09/02/2011, 10/02/2016, 08/28/2017,  09/11/2018   Pneumococcal Conjugate-13 06/07/2014   Pneumococcal Polysaccharide-23 09/01/2001   Td 06/01/2009   Tdap 04/26/2016   Zoster 05/23/2009   Pertinent  Health Maintenance Due  Topic Date Due   URINE MICROALBUMIN  04/09/2019 (Originally 02/20/1936)   OPHTHALMOLOGY EXAM  04/29/2019   FOOT EXAM  06/10/2019   INFLUENZA VACCINE  07/03/2019   HEMOGLOBIN A1C  09/03/2019   PNA vac Low Risk Adult  Completed   Fall Risk  01/12/2018 11/06/2017 06/10/2016  Falls in the past year? Yes Yes Yes  Comment - [facility] Telephone Survey: data to providers prior to load -  Number falls in past yr: 1 1 1   Comment - [facility] Telephone Survey Actual Response = 1 -  Injury with Fall? Yes Yes Yes  Risk Factor Category  High Fall Risk - High Fall Risk  Risk for fall due to : - -  Impaired vision  Follow up - - Falls evaluation completed;Education provided;Falls prevention discussed   Functional Status Survey:    Vitals:   03/15/19 1531  BP: 94/67  Pulse: 68  Resp: 16  Temp: (!) 97.2 F (36.2 C)  TempSrc: Oral   There is no height or weight on file to calculate BMI. Physical Exam Vitals signs reviewed.  HENT:     Head: Normocephalic.     Nose: Nose normal.     Mouth/Throat:     Comments: Mild thrush Eyes:     Pupils: Pupils are equal, round, and reactive to light.     Comments: Legally Blind  Cardiovascular:     Rate and Rhythm: Normal rate and regular rhythm.     Pulses: Normal pulses.     Heart sounds: Normal heart sounds.  Pulmonary:     Effort: Pulmonary effort is normal. No respiratory distress.     Breath sounds: Normal breath sounds. No rales.  Abdominal:     General: Abdomen is flat. Bowel sounds are normal.     Palpations: Abdomen is soft.  Musculoskeletal:        General: No swelling.     Comments: Has Bruises all over his Extremities Has Big Hematoma on his Right Hand With discharge No Pain in moving his Right Hip  Skin:    Comments: Bruises and Hematomas    Neurological:     General: No focal deficit present.     Mental Status: He is alert and oriented to person, place, and time.  Psychiatric:        Attention and Perception: Attention normal.        Mood and Affect: Mood is depressed.        Speech: Speech is delayed.     Labs reviewed: Recent Labs    03/05/19 0302 03/06/19 0235 03/07/19 0426  NA 136 136 138  K 3.6 3.4* 4.7  CL 100 102 105  CO2 21* 21* 21*  GLUCOSE 218* 276* 256*  BUN 20 18 17   CREATININE 1.31* 1.28* 1.03  CALCIUM 8.6* 8.5* 8.4*   Recent Labs    03/03/19 2046 03/04/19 0548  AST 15 13*  ALT 13 13  ALKPHOS 120 109  BILITOT 0.8 1.3*  PROT 6.5 5.7*  ALBUMIN 3.5 2.9*   Recent Labs    03/03/19 2046 03/04/19 0548 03/05/19 0302 03/06/19 0235 03/07/19 0426  WBC 15.9* 11.9*  --   --   --   NEUTROABS 13.6*  --   --   --   --   HGB 12.1* 10.8* 10.0* 9.9* 11.3*  HCT 37.7* 32.7* 31.7* 31.0* 35.6*  MCV 92.2 92.1  --   --   --   PLT 336 323  --   --   --    No results found for: TSH Lab Results  Component Value Date   HGBA1C 7.8 (H) 03/04/2019   Lab Results  Component Value Date   CHOL 136 05/20/2017   HDL 61 05/20/2017   LDLCALC 66 05/20/2017   TRIG 43 05/20/2017   CHOLHDL 2.2 05/20/2017    Significant Diagnostic Results in last 30 days:  Dg Chest 1 View  Result Date: 03/03/2019 CLINICAL DATA:  Altered mental status. Hyperglycemia. EXAM: CHEST  1 VIEW COMPARISON:  02/26/2017. FINDINGS: Cardiomegaly. Low lung volumes. Bibasilar subsegmental atelectasis. No consolidation or edema. Old rib fractures. No pneumothorax. Osteopenia. IMPRESSION: 1. Cardiomegaly with low lung volumes. Bibasilar subsegmental atelectasis. 2. No consolidation or edema. Electronically  Signed   By: Staci Righter M.D.   On: 03/03/2019 20:10   Ct Head Wo Contrast  Result Date: 03/04/2019 CLINICAL DATA:  Followup left-sided extra-axial hemorrhage. EXAM: CT HEAD WITHOUT CONTRAST TECHNIQUE: Contiguous axial images were obtained  from the base of the skull through the vertex without intravenous contrast. COMPARISON:  03/03/2019 FINDINGS: Brain: No change since the previous study. Extra-axial hematoma in the left lateral frontotemporal convexity shows maximal thickness of 9 mm compared with 10 mm previously. This is favored to be subdural, though epidural hematoma is not completely excluded. No significant mass effect. No other intracranial hemorrhage evident. Generalized brain atrophy with chronic small-vessel ischemic changes throughout the brain as seen previously. Vascular: There is atherosclerotic calcification of the major vessels at the base of the brain. Skull: No skull fracture or skull base fracture seen. Sinuses/Orbits: Chronic inflammatory opacification of the left maxillary sinus. Some chronic ethmoid disease, left more than right. Orbits negative. Other: None IMPRESSION: No worsening since yesterday. Extra-axial hematoma in the left lateral frontotemporal convexity shows maximal thickness of 9 mm today, measured at 10 mm yesterday. No evidence of additional bleeding. No mass effect. This could be either subdural or epidural, but is not enlarging or worsening. Electronically Signed   By: Nelson Chimes M.D.   On: 03/04/2019 09:44   Ct Head Wo Contrast  Result Date: 03/03/2019 CLINICAL DATA:  85 year old male with confusion. EXAM: CT HEAD WITHOUT CONTRAST TECHNIQUE: Contiguous axial images were obtained from the base of the skull through the vertex without intravenous contrast. COMPARISON:  Head CT dated 02/11/2018 FINDINGS: Brain: There is mild to moderate age-related atrophy and chronic microvascular ischemic changes. There is a 3.6 x 1.1 cm limited form high attenuating extra-axial collection along the left temporal lobe which may represent a subdural or epidural hemorrhage. There is minimal associated mass effect on the left temporal lobe. No midline shift. Vascular: No hyperdense vessel or unexpected calcification. Skull:  Normal. Negative for fracture or focal lesion. Sinuses/Orbits: There is near complete opacification of the left maxillary sinus with diffuse mucoperiosteal thickening of paranasal sinuses. The mastoid air cells are clear. Other: None IMPRESSION: 1. High attenuating extra-axial collection along the left temporal lobe which may represent a subdural or epidural hemorrhage. Close follow-up with CT recommended. 2. Age-related atrophy and chronic microvascular ischemic changes. 3. Paranasal sinus disease. These results were called by telephone at the time of interpretation on 03/03/2019 at 8:11 pm to Dr. Francine Graven , who verbally acknowledged these results. Electronically Signed   By: Anner Crete M.D.   On: 03/03/2019 20:13   Dg Chest Port 1 View  Result Date: 03/08/2019 CLINICAL DATA:  Food aspiration.  Hypoxia. EXAM: PORTABLE CHEST 1 VIEW COMPARISON:  Radiographs 03/03/2019 and 02/26/2017. FINDINGS: 1355 hours. The heart size and mediastinal contours are stable. There is aortic atherosclerosis. There are increased patchy opacities at both lung bases, left greater than right. There are possible small bilateral pleural effusions. No pneumothorax. Old rib fractures are present on the right. IMPRESSION: Increased patchy airspace opacities at both lung bases with possible small pleural effusions, consistent with aspiration. Electronically Signed   By: Richardean Sale M.D.   On: 03/08/2019 13:16   Dg Hip Unilat With Pelvis 2-3 Views Right  Result Date: 03/15/2019 CLINICAL DATA:  Acute RIGHT hip pain following fall. Initial encounter. EXAM: DG HIP (WITH OR WITHOUT PELVIS) 2-3V RIGHT COMPARISON:  01/09/2016 radiograph FINDINGS: A nondisplaced RIGHT INFERIOR pubic ramus fracture is noted. Remote bilateral proximal femur  and LEFT pubic fractures noted. Nail/screw fixation within both proximal femurs noted. No suspicious focal bony lesions identified. IMPRESSION: Nondisplaced RIGHT INFERIOR pubic ramus fracture.  Electronically Signed   By: Margarette Canada M.D.   On: 03/15/2019 14:21    Assessment/Plan Right Inferior Pubis Fracture no displaced Pain Control Appointment with Dr Aline Brochure  Fall precautions  Type 1 diabetes mellitus with hyperglycemia  Follows Closely with Dr [facility] would not  do Tresiba Sliding scale as he does at home So will start him on Tresiba 6 units QHS  Other issues are stable Subdural hematoma  Xarelto on hold Will Need repeat CT scan on 4 weeks Continue To monitor him Clinically His weakness seem to be generalized decline and he will benefit from Long term placement Therapy will work with him CKD stage 3  Creat Stblae  History of pulmonary embolism./ DVT Xarelto on Hold Will continue to follow closely  Chronic indwelling Foley catheter Supportive care On Flomax  Legally blind Aspiration Pneumonia Is on Augmentin Will continue for 7 days as he has this Hematoma and has Cellulitis  on right hand  Right Hand Hematoma with Cellulitis D/W the nurse plan For Xeroform dressing Augmentin will be continued for 1 week  Depression with Anxiety Continue on Celexa and Klonopin Anemia Repeat CBC Oral Thrush  on Nystatin    Family/ staff Communication:   Labs/tests ordered:  Will repeat CMP and CBC Total time spent in this patient care encounter was  25_  minutes; greater than 50% of the visit spent counseling patient and staff, reviewing records , Labs and coordinating care for problems addressed at this encounter.

## 2019-03-15 NOTE — Telephone Encounter (Signed)
Phone call from [facility].  Patient fell Sat 03/12/19.  He has a right pubic rami fracture, nondisplaced.  They are asking for him to be seen, but I did not make appt at this time due to COVID 19 restrictions.  Please call them to advise further? Thanks.

## 2019-03-16 ENCOUNTER — Encounter: Payer: Self-pay | Admitting: Adult Health

## 2019-03-16 ENCOUNTER — Other Ambulatory Visit: Payer: Self-pay | Admitting: *Deleted

## 2019-03-16 ENCOUNTER — Non-Acute Institutional Stay (SKILLED_NURSING_FACILITY): Payer: Medicare Other | Admitting: Adult Health

## 2019-03-16 DIAGNOSIS — S32591S Other specified fracture of right pubis, sequela: Secondary | ICD-10-CM

## 2019-03-16 NOTE — Patient Outreach (Signed)
[facility]) Care Management  03/16/2019  FULLER MAKIN 05-07-1926 [id number]   Collaboration with [facility]. Mr. Ruane remains at [facility] for therapy.   Mr. Voris has multiple co-morbities including DM, CKD, HTN. Disposition plans are not known at this time.  Discussed that writer will continue to follow disposition plans and progression. Will engage for potential [facility] Care Management if appropriate.  Plan for continued collaboration with [facility].    Marthenia Rolling, MSN-Ed, RN,BSN [place] Acute Care Coordinator [phone]

## 2019-03-16 NOTE — Progress Notes (Signed)
Location:   [facility] Room Number: 155 P Place of Service:  SNF (31)   CODE STATUS: DNR (Copy in Vynca)  No Known Allergies  Chief Complaint  Patient presents with  . Acute Visit    Follow up Pelvic Fracture    HPI:  He has had a fall with right pelvic fracture. He has been transferring himself to bed. He denies any pain. He is able to move all his extremities. He denies any uncontrolled anxiety. He needs to restart therapy to help with safe transfers.   Past Medical History:  Diagnosis Date  . Ataxia   . Chronic anticoagulation   . Diabetes mellitus   . DVT (deep venous thrombosis) (HCC)    recurrent  . Falls   . Gastroparesis   . Hypertension   . Legally blind    diabetic retinopathy  . Mild dementia ([place])   . Neuropathy   . Osteoporosis   . Pulmonary embolism ([place]) 12/2010   on blood thinners  . Stroke ([place])   . Weight loss     Past Surgical History:  Procedure Laterality Date  . ANKLE SURGERY     Patient states that he had pins place in the left foot  . BACK SURGERY  2008  . CARPAL TUNNEL RELEASE  08/2011   Left hand  . COLONOSCOPY  01/2011  . ORIF HIP FRACTURE  10/31/2011   Procedure: OPEN REDUCTION INTERNAL FIXATION HIP;  Surgeon: Arther Abbott, MD;  Location: [facility];  Service: Orthopedics;  Laterality: Right;  with Gamma Nail  . TRANSURETHRAL RESECTION OF PROSTATE  1984  . UPPER GASTROINTESTINAL ENDOSCOPY  01/2011    Social History   Socioeconomic History  . Marital status: Divorced    Spouse name: Not on file  . Number of children: Not on file  . Years of education: Not on file  . Highest education level: Not on file  Occupational History  . Not on file  Social Needs  . Financial resource strain: Not on file  . Food insecurity:    Worry: Not on file    Inability: Not on file  . Transportation needs:    Medical: Not on file    Non-medical: Not on file  Tobacco Use  . Smoking status: Former Smoker   Years: 20.00    Types: Cigars    Last attempt to quit: 10/27/1981    Years since quitting: 37.4  . Smokeless tobacco: Never Used  Substance and Sexual Activity  . Alcohol use: No    Alcohol/week: 0.0 standard drinks    Comment: Drinks Scotch 3 times per week prior to [employer]- hasn't drank x 1 month  . Drug use: No  . Sexual activity: Never  Lifestyle  . Physical activity:    Days per week: Not on file    Minutes per session: Not on file  . Stress: Not on file  Relationships  . Social connections:    Talks on phone: Not on file    Gets together: Not on file    Attends religious service: Not on file    Active member of club or organization: Not on file    Attends meetings of clubs or organizations: Not on file    Relationship status: Not on file  . Intimate partner violence:    Fear of current or ex partner: Not on file    Emotionally abused: Not on file    Physically abused: Not on file  Forced sexual activity: Not on file  Other Topics Concern  . Not on file  Social History Narrative   Pt lives in 1 story home with his son and sons family   Has 3 adult children   High school graduate   Retired Regulatory affairs officer   Family History  Problem Relation Age of Onset  . Diabetes Mother   . Diabetes Father   . Diabetes Sister   . Diabetes Brother   . Diabetes Sister   . Diabetes Son   . Healthy Son   . Healthy Son       VITAL SIGNS BP (!) 146/72   Pulse 80   Temp (!) 97.5 F (36.4 C)   Resp 18   Ht 5\' 7"  (1.702 m)   Wt 132 lb 11.2 oz (60.2 kg)   BMI 20.78 kg/m   Outpatient Encounter Medications as of 03/16/2019  Medication Sig  . amoxicillin-clavulanate (AUGMENTIN) 875-125 MG tablet Take 1 tablet by mouth every 12 (twelve) hours.  Roseanne Kaufman Peru-Castor Oil (VENELEX) OINT Apply liberal amount to sacrum & bilateral buttocks qshift & prn incotinence for prevention  . citalopram (CELEXA) 40 MG tablet Take 40 mg by mouth daily.  . clonazePAM (KLONOPIN) 0.5 MG tablet  Take 1 tablet (0.5 mg total) by mouth 2 (two) times daily as needed (agitiation).  . ezetimibe (ZETIA) 10 MG tablet Take 10 mg by mouth at bedtime.  . Glucerna (GLUCERNA) LIQD Take 237 mLs by mouth 2 (two) times daily between meals.  . insulin lispro (HUMALOG KWIKPEN) 100 UNIT/ML KwikPen Inject 0-0.06 mLs (0-6 Units total) into the skin 3 (three) times daily. CBG 151 - 200 (dose in units): 2 CBG 201 - 250 (dose in units): 3 CBG 251 - 300 (dose in units): 4 CBG 301 - 350 (dose in units): 5 CBG 351 - 400 (dose in units): 6  . latanoprost (XALATAN) 0.005 % ophthalmic solution Place 1 drop into both eyes at bedtime.   . NON FORMULARY Diet Type:  Downgrade patient to dysphagia 3 chopped, continue thin liquids  Consistent CHO snack at bedtime  . nystatin (MYCOSTATIN) 100000 UNIT/ML suspension Take 5 mLs by mouth daily. Swish and spit once daily for 7 days   . Ostomy Supplies (SKIN PREP WIPES) MISC Apply to bilateral heels every shift for preventions  . polyethylene glycol (MIRALAX / GLYCOLAX) 17 g packet Take 34 gm by mouth once a morning in 6 oz of liquid and take 17 gm with 4 oz of liquid once a day prn  . tamsulosin (FLOMAX) 0.4 MG CAPS capsule Take 0.4 mg by mouth daily.  . TRESIBA FLEXTOUCH 100 UNIT/ML SOPN FlexTouch Pen 6 Units at bedtime.   . vitamin B-12 (CYANOCOBALAMIN) 1000 MCG tablet Take 1,000 mcg by mouth every morning.   . Vitamin D, Ergocalciferol, (DRISDOL) 50000 UNITS CAPS Take 50,000 Units by mouth every Wednesday.    No facility-administered encounter medications on file as of 03/16/2019.      SIGNIFICANT DIAGNOSTIC EXAMS  TODAY:   03-03-19: ct of head:  1. High attenuating extra-axial collection along the left temporal lobe which may represent a subdural or epidural hemorrhage. Close follow-up with CT recommended. 2. Age-related atrophy and chronic microvascular ischemic changes. 3. Paranasal sinus disease.  03-03-19: chest x-ray: 1. Cardiomegaly with low lung volumes.  Bibasilar subsegmental atelectasis. 2. No consolidation or edema.  03-04-19: ct of head:  No worsening since yesterday. Extra-axial hematoma in the left lateral frontotemporal convexity shows maximal thickness of  9 mm today, measured at 10 mm yesterday. No evidence of additional bleeding. No mass effect. This could be either subdural or epidural, but is not enlarging or worsening.  03-15-19: right hip and pelvic x-ray; Nondisplaced RIGHT INFERIOR pubic ramus fracture.  LABS REVIEWED TODAY:   03-03-19: wbc 15.9; hgb 12.1; hct 37.7; mcv 92.2; plt 336; glucose 307; bun 26; creat 1.31; k+ 3.7; na++ 133; ca 8.6 liver normal albumin 3.5 urine culture: no growth  03-04-19: hgb a1c 7.8  03-07-19: hgb 11.3 hct 35.6; glucose 256; bun 17; creat 1.03; k+ 4.7; na++ 138; ca 8.4  Review of Systems  Constitutional: Negative for malaise/fatigue.  Respiratory: Negative for cough and shortness of breath.   Cardiovascular: Negative for chest pain, palpitations and leg swelling.  Gastrointestinal: Negative for abdominal pain, constipation and heartburn.  Musculoskeletal: Negative for back pain, joint pain and myalgias.  Skin: Negative.   Neurological: Negative for dizziness.  Psychiatric/Behavioral: The patient is nervous/anxious.    Physical Exam Constitutional:      General: He is not in acute distress.    Appearance: He is well-developed. He is not diaphoretic.     Comments: thin  Eyes:     Comments: Legally blind   Neck:     Thyroid: No thyromegaly.  Cardiovascular:     Rate and Rhythm: Normal rate and regular rhythm.     Heart sounds: Normal heart sounds.  Pulmonary:     Effort: Pulmonary effort is normal. No respiratory distress.     Breath sounds: Normal breath sounds.  Abdominal:     General: Bowel sounds are normal. There is no distension.     Palpations: Abdomen is soft.     Tenderness: There is no abdominal tenderness.  Genitourinary:    Comments: Has foley History of TURP  Musculoskeletal:     Right lower leg: No edema.     Left lower leg: No edema.     Comments: Is able to move all extremities History of left ankle pinning History of ORIF right hip   Lymphadenopathy:     Cervical: No cervical adenopathy.  Skin:    General: Skin is warm and dry.     Comments: Multiple bruises with hematomas   Neurological:     Mental Status: He is alert. Mental status is at baseline.  Psychiatric:        Mood and Affect: Mood normal.       ASSESSMENT/ PLAN:  TODAY  1. Closed fracture of right inferior pubic ramus; sequela: he has been transferring himself; will have him restart therapy as indicated and will await ortho follow up.   MD is aware of resident's narcotic use and is in agreement with current plan of care. We will attempt to wean resident as apropriate   Ok Edwards NP [facility]  Contact [phone] Monday through Friday 8am- 5pm  After hours call [phone]

## 2019-03-17 NOTE — Telephone Encounter (Signed)
Janett Billow called back, and I sched appt for 05/03/19.  I s/w Varney Baas and advised of Dr Ruthe Mannan instructions in note. They will call if any further issues between now and appt.

## 2019-03-17 NOTE — Telephone Encounter (Signed)
IC and had to leave message, Janett Billow to call me back.

## 2019-03-17 NOTE — Telephone Encounter (Signed)
xr was checked   Doesn't meet criteria for in person visit   The fracture doesn't need surgery   Weight bearing status as tolerated with walker   We can see in June

## 2019-03-18 ENCOUNTER — Non-Acute Institutional Stay (SKILLED_NURSING_FACILITY): Payer: Medicare Other | Admitting: Adult Health

## 2019-03-18 ENCOUNTER — Encounter: Payer: Self-pay | Admitting: Adult Health

## 2019-03-18 DIAGNOSIS — E559 Vitamin D deficiency, unspecified: Secondary | ICD-10-CM | POA: Diagnosis not present

## 2019-03-18 DIAGNOSIS — N138 Other obstructive and reflux uropathy: Secondary | ICD-10-CM | POA: Diagnosis not present

## 2019-03-18 DIAGNOSIS — E1069 Type 1 diabetes mellitus with other specified complication: Secondary | ICD-10-CM

## 2019-03-18 DIAGNOSIS — S32591S Other specified fracture of right pubis, sequela: Secondary | ICD-10-CM | POA: Diagnosis not present

## 2019-03-18 DIAGNOSIS — I4891 Unspecified atrial fibrillation: Secondary | ICD-10-CM | POA: Diagnosis not present

## 2019-03-18 DIAGNOSIS — E43 Unspecified severe protein-calorie malnutrition: Secondary | ICD-10-CM | POA: Diagnosis not present

## 2019-03-18 DIAGNOSIS — E1022 Type 1 diabetes mellitus with diabetic chronic kidney disease: Secondary | ICD-10-CM | POA: Diagnosis not present

## 2019-03-18 DIAGNOSIS — F339 Major depressive disorder, recurrent, unspecified: Secondary | ICD-10-CM

## 2019-03-18 DIAGNOSIS — S065X9A Traumatic subdural hemorrhage with loss of consciousness of unspecified duration, initial encounter: Secondary | ICD-10-CM | POA: Diagnosis not present

## 2019-03-18 DIAGNOSIS — N183 Chronic kidney disease, stage 3 unspecified: Secondary | ICD-10-CM

## 2019-03-18 DIAGNOSIS — R339 Retention of urine, unspecified: Secondary | ICD-10-CM | POA: Diagnosis not present

## 2019-03-18 DIAGNOSIS — N401 Enlarged prostate with lower urinary tract symptoms: Secondary | ICD-10-CM

## 2019-03-18 DIAGNOSIS — E785 Hyperlipidemia, unspecified: Secondary | ICD-10-CM

## 2019-03-18 DIAGNOSIS — S065XAA Traumatic subdural hemorrhage with loss of consciousness status unknown, initial encounter: Secondary | ICD-10-CM

## 2019-03-18 NOTE — Progress Notes (Signed)
Location:   [facility] Room Number: 155 P Place of Service:  SNF (31)   CODE STATUS: DNR  No Known Allergies  Chief Complaint  Patient presents with  . Medical Management of Chronic Issues    Type 1 diabetes mellitus with stage 3 chronic kidney disease with long term current use of insulin;atrial fibrillation unspecified type; ckd stage 3 due to type 1 diabetes mellitus. Weekly follow up for the first 30 days post hospitalization.     HPI:  He is a short term rehab patient of this facility being seen for the management of her chronic illnesses: diabetes; afib; ckd stage 3. He does have some vertigo. He states that his anxiety is under control he denies any uncontrolled pain. He has not used his narcotics. More than likely he will require long term placement.   Past Medical History:  Diagnosis Date  . Ataxia   . Chronic anticoagulation   . Diabetes mellitus   . DVT (deep venous thrombosis) (HCC)    recurrent  . Falls   . Gastroparesis   . Hypertension   . Legally blind    diabetic retinopathy  . Mild dementia ([place])   . Neuropathy   . Osteoporosis   . Pulmonary embolism ([place]) 12/2010   on blood thinners  . Stroke ([place])   . Weight loss     Past Surgical History:  Procedure Laterality Date  . ANKLE SURGERY     Patient states that he had pins place in the left foot  . BACK SURGERY  2008  . CARPAL TUNNEL RELEASE  08/2011   Left hand  . COLONOSCOPY  01/2011  . ORIF HIP FRACTURE  10/31/2011   Procedure: OPEN REDUCTION INTERNAL FIXATION HIP;  Surgeon: Arther Abbott, MD;  Location: [facility];  Service: Orthopedics;  Laterality: Right;  with Gamma Nail  . TRANSURETHRAL RESECTION OF PROSTATE  1984  . UPPER GASTROINTESTINAL ENDOSCOPY  01/2011    Social History   Socioeconomic History  . Marital status: Divorced    Spouse name: Not on file  . Number of children: Not on file  . Years of education: Not on file  . Highest education level: Not  on file  Occupational History  . Not on file  Social Needs  . Financial resource strain: Not on file  . Food insecurity:    Worry: Not on file    Inability: Not on file  . Transportation needs:    Medical: Not on file    Non-medical: Not on file  Tobacco Use  . Smoking status: Former Smoker    Years: 20.00    Types: Cigars    Last attempt to quit: 10/27/1981    Years since quitting: 37.4  . Smokeless tobacco: Never Used  Substance and Sexual Activity  . Alcohol use: No    Alcohol/week: 0.0 standard drinks    Comment: Drinks Scotch 3 times per week prior to [employer]- hasn't drank x 1 month  . Drug use: No  . Sexual activity: Never  Lifestyle  . Physical activity:    Days per week: Not on file    Minutes per session: Not on file  . Stress: Not on file  Relationships  . Social connections:    Talks on phone: Not on file    Gets together: Not on file    Attends religious service: Not on file    Active member of club or organization: Not on file  Attends meetings of clubs or organizations: Not on file    Relationship status: Not on file  . Intimate partner violence:    Fear of current or ex partner: Not on file    Emotionally abused: Not on file    Physically abused: Not on file    Forced sexual activity: Not on file  Other Topics Concern  . Not on file  Social History Narrative   Pt lives in 1 story home with his son and sons family   Has 3 adult children   High school graduate   Retired Regulatory affairs officer   Family History  Problem Relation Age of Onset  . Diabetes Mother   . Diabetes Father   . Diabetes Sister   . Diabetes Brother   . Diabetes Sister   . Diabetes Son   . Healthy Son   . Healthy Son       VITAL SIGNS BP (!) 156/79   Pulse 73   Temp 98 F (36.7 C)   Resp 17   Ht 5\' 7"  (1.702 m)   Wt 132 lb 11.2 oz (60.2 kg)   BMI 20.78 kg/m   Outpatient Encounter Medications as of 03/18/2019  Medication Sig  . amoxicillin-clavulanate (AUGMENTIN)  875-125 MG tablet Take 1 tablet by mouth every 12 (twelve) hours.  Roseanne Kaufman [place]-Castor Oil (VENELEX) OINT Apply liberal amount to sacrum & bilateral buttocks qshift & prn incotinence for prevention  . Bismuth Tribromoph-Petrolatum (XEROFORM OIL EMULSION GAUZE EX) Clean back of left hand / BUE with N/S and apply xeroform gauze and wrap with kling.  Change daily and as needed until resoloved  . citalopram (CELEXA) 40 MG tablet Take 40 mg by mouth daily.  . clonazePAM (KLONOPIN) 0.5 MG tablet Take 1 tablet (0.5 mg total) by mouth 2 (two) times daily as needed (agitiation).  . ezetimibe (ZETIA) 10 MG tablet Take 10 mg by mouth at bedtime.  . Glucerna (GLUCERNA) LIQD Take 237 mLs by mouth 2 (two) times daily between meals.  . insulin lispro (HUMALOG KWIKPEN) 100 UNIT/ML KwikPen Inject 0-0.06 mLs (0-6 Units total) into the skin 3 (three) times daily. CBG 151 - 200 (dose in units): 2 CBG 201 - 250 (dose in units): 3 CBG 251 - 300 (dose in units): 4 CBG 301 - 350 (dose in units): 5 CBG 351 - 400 (dose in units): 6  . latanoprost (XALATAN) 0.005 % ophthalmic solution Place 1 drop into both eyes at bedtime.   . NON FORMULARY Diet Type:  Downgrade patient to dysphagia 3 chopped, continue thin liquids  Consistent CHO snack at bedtime  . Ostomy Supplies (SKIN PREP WIPES) MISC Apply to bilateral heels every shift for preventions  . polyethylene glycol (MIRALAX / GLYCOLAX) 17 g packet Take 34 gm by mouth once a morning in 6 oz of liquid and take 17 gm with 4 oz of liquid once a day prn  . tamsulosin (FLOMAX) 0.4 MG CAPS capsule Take 0.4 mg by mouth daily.  . TRESIBA FLEXTOUCH 100 UNIT/ML SOPN FlexTouch Pen 6 Units at bedtime.   . vitamin B-12 (CYANOCOBALAMIN) 1000 MCG tablet Take 1,000 mcg by mouth every morning.   . Vitamin D, Ergocalciferol, (DRISDOL) 50000 UNITS CAPS Take 50,000 Units by mouth every Wednesday.    No facility-administered encounter medications on file as of 03/18/2019.      SIGNIFICANT  DIAGNOSTIC EXAMS  PREVIOUS:   03-03-19: ct of head:  1. High attenuating extra-axial collection along the left temporal lobe which  may represent a subdural or epidural hemorrhage. Close follow-up with CT recommended. 2. Age-related atrophy and chronic microvascular ischemic changes. 3. Paranasal sinus disease.  03-03-19: chest x-ray:  1. Cardiomegaly with low lung volumes. Bibasilar subsegmental atelectasis. 2. No consolidation or edema.  03-04-19: ct of head:  No worsening since yesterday. Extra-axial hematoma in the left lateral frontotemporal convexity shows maximal thickness of 9 mm today, measured at 10 mm yesterday. No evidence of additional bleeding. No mass effect. This could be either subdural or epidural, but is not enlarging or worsening.  03-08-19: chest x-ray: Increased patchy airspace opacities at both lung bases with possible small pleural effusions, consistent with aspiration.  NO NEW EXAMS   LABS REVIEWED PREVIOUS:   03-03-19: wbc 15.9; hgb 12.1; hct 37.7; mcv 92.2; plt 336; glucose 307; bun 26; creat 1.31 ;k+ 3.7; na++ 133; ca 8.6; liver normal albumin 2.9 03-04-19: wbc 11.9; hgb 10.8; hct 32.7; mcv 92.1; plt 323; glucose 411 bun 21; creat 1.39; k+ 4.4; na++ 132; ca 8.4; liver normal albumin 2.9 hgb a1c 7.8  03-07-19: hgb 11.3; hct 35.6; glucose 256; bun 17; creat 1.03 ;k+ 4.7; na++ 138; ca 8.4   NO NEW LABS.    Review of Systems  Constitutional: Negative for malaise/fatigue.  Respiratory: Negative for cough and shortness of breath.   Cardiovascular: Negative for chest pain, palpitations and leg swelling.  Gastrointestinal: Negative for abdominal pain, constipation and heartburn.  Musculoskeletal: Negative for back pain, joint pain and myalgias.  Skin: Negative.   Neurological: Positive for dizziness.  Psychiatric/Behavioral: The patient is not nervous/anxious.     Physical Exam Constitutional:      General: He is not in acute distress.    Appearance: He is  well-developed. He is not diaphoretic.     Comments: thin  Eyes:     Comments: Legally blind    Neck:     Musculoskeletal: Neck supple.     Thyroid: No thyromegaly.  Cardiovascular:     Rate and Rhythm: Normal rate and regular rhythm.     Pulses: Normal pulses.     Heart sounds: Normal heart sounds.  Pulmonary:     Effort: Pulmonary effort is normal. No respiratory distress.     Breath sounds: Normal breath sounds.  Abdominal:     General: Bowel sounds are normal. There is no distension.     Palpations: Abdomen is soft.     Tenderness: There is no abdominal tenderness.  Genitourinary:    Comments: History of TURP Has foley  Musculoskeletal:     Right lower leg: No edema.     Left lower leg: No edema.     Comments:  Is able to move all extremities History of left ankle pinning History of ORIF right hip    Lymphadenopathy:     Cervical: No cervical adenopathy.  Skin:    General: Skin is warm and dry.  Neurological:     Mental Status: He is alert. Mental status is at baseline.  Psychiatric:        Mood and Affect: Mood normal.       ASSESSMENT/ PLAN:  TODAY:  1. Atrial fibrillation, unspecified type: is stable will continue to monitor his status.   2.CKD stage 3 due to type 1 diabetes mellitus: is stable bun 17; creat 1.03  3. Type 1 diabetes mellitus with stage 3 chronic kidney disease with long term current use of insulin: stable hgb a1c 7.8 will continue tresiba 6 units nightly humalog SSI: 201-250:  3 units; 251-300: 4 units; 301-350: 5 units; 351-400: 6 units. Is followed by endocrinology   4. Dyslipidemia due to type 1  diabetes mellitus: is stable will continue zetia 10 mg daily  5. Subdural hematoma: is without change: does have vertigo; will continue to monitor his status.   6. Closed fracture of right inferior pubic ramus sequela: is stable will stop vicodin will begin tylenol 1 gm three times daily   7. BPH with with urinary obstruction; chronic  retention of urine: has chronic foley: will stop flomax due to orthostatic hypotension  8. Major depression recurrent chronic: is stable will continue celexa 40 mg daily and will stop klonopin due to non use  9. Vitamin D deficiency: is stable will continue vit d 50,000 units weekly   10. Severe protein calorie malnutrition: is without change albumin 2.9 will continue supplements as directed   11. Aspiration pneumonia: is stable will complete augmentin and will monitor      MD is aware of resident's narcotic use and is in agreement with current plan of care. We will attempt to wean resident as apropriate   Ok Edwards NP [facility]  Contact [phone] Monday through Friday 8am- 5pm  After hours call [phone]

## 2019-03-19 ENCOUNTER — Encounter (HOSPITAL_COMMUNITY)
Admission: RE | Admit: 2019-03-19 | Discharge: 2019-03-19 | Disposition: A | Discharge status: Home / Self Care | Payer: Medicare Other | Source: Skilled Nursing Facility | Attending: Internal Medicine | Admitting: Internal Medicine

## 2019-03-19 ENCOUNTER — Non-Acute Institutional Stay (SKILLED_NURSING_FACILITY): Payer: Medicare Other | Admitting: Adult Health

## 2019-03-19 ENCOUNTER — Encounter: Payer: Self-pay | Admitting: Adult Health

## 2019-03-19 DIAGNOSIS — N183 Chronic kidney disease, stage 3 (moderate): Secondary | ICD-10-CM | POA: Diagnosis not present

## 2019-03-19 DIAGNOSIS — N179 Acute kidney failure, unspecified: Secondary | ICD-10-CM

## 2019-03-19 DIAGNOSIS — E1022 Type 1 diabetes mellitus with diabetic chronic kidney disease: Secondary | ICD-10-CM

## 2019-03-19 DIAGNOSIS — D649 Anemia, unspecified: Secondary | ICD-10-CM

## 2019-03-19 LAB — COMPREHENSIVE METABOLIC PANEL
ALT: 18 U/L (ref 0–44)
AST: 24 U/L (ref 15–41)
Albumin: 2.2 g/dL — ABNORMAL LOW (ref 3.5–5.0)
Alkaline Phosphatase: 148 U/L — ABNORMAL HIGH (ref 38–126)
Anion gap: 8 (ref 5–15)
BUN: 29 mg/dL — ABNORMAL HIGH (ref 8–23)
CO2: 29 mmol/L (ref 22–32)
Calcium: 7.9 mg/dL — ABNORMAL LOW (ref 8.9–10.3)
Chloride: 100 mmol/L (ref 98–111)
Creatinine, Ser: 1.19 mg/dL (ref 0.61–1.24)
GFR calc Af Amer: >60 mL/min (ref >60)
GFR calc non Af Amer: 53 mL/min — ABNORMAL LOW (ref >60)
Glucose, Bld: 281 mg/dL — ABNORMAL HIGH (ref 70–99)
Potassium: 4.1 mmol/L (ref 3.5–5.1)
Sodium: 137 mmol/L (ref 135–145)
Total Bilirubin: 0.7 mg/dL (ref 0.3–1.2)
Total Protein: 5.2 g/dL — ABNORMAL LOW (ref 6.5–8.1)

## 2019-03-19 LAB — CBC WITH DIFFERENTIAL/PLATELET
Abs Immature Granulocytes: 0.02 10*3/uL (ref 0.00–0.07)
Basophils Absolute: 0 10*3/uL (ref 0.0–0.1)
Basophils Relative: 0 %
Eosinophils Absolute: 0.1 10*3/uL (ref 0.0–0.5)
Eosinophils Relative: 2 %
HCT: 30.1 % — ABNORMAL LOW (ref 39.0–52.0)
Hemoglobin: 9.6 g/dL — ABNORMAL LOW (ref 13.0–17.0)
Immature Granulocytes: 0 %
Lymphocytes Relative: 16 %
Lymphs Abs: 1.1 10*3/uL (ref 0.7–4.0)
MCH: 29.5 pg (ref 26.0–34.0)
MCHC: 31.9 g/dL (ref 30.0–36.0)
MCV: 92.6 fL (ref 80.0–100.0)
Monocytes Absolute: 0.6 10*3/uL (ref 0.1–1.0)
Monocytes Relative: 9 %
Neutro Abs: 4.9 10*3/uL (ref 1.7–7.7)
Neutrophils Relative %: 73 %
Platelets: 239 10*3/uL (ref 150–400)
RBC: 3.25 MIL/uL — ABNORMAL LOW (ref 4.22–5.81)
RDW: 13.5 % (ref 11.5–15.5)
WBC: 6.7 10*3/uL (ref 4.0–10.5)
nRBC: 0 % (ref 0.0–0.2)

## 2019-03-19 NOTE — Progress Notes (Signed)
Location:   [facility] Room Number: 155 P Place of Service:  SNF (31)   CODE STATUS: DNR  No Known Allergies  Chief Complaint  Patient presents with  . Acute Visit    Vertigo    HPI:  He continues to have issues with vertigo. Per therapy he is unable to stand. He does become very lightheaded with loss of systolic b/p. His anemia is worse; his cbg readings are elevated. He denies any uncontrolled no anxiety no reports of fevers present.   Past Medical History:  Diagnosis Date  . Ataxia   . Chronic anticoagulation   . Diabetes mellitus   . DVT (deep venous thrombosis) (HCC)    recurrent  . Falls   . Gastroparesis   . Hypertension   . Legally blind    diabetic retinopathy  . Mild dementia ([place])   . Neuropathy   . Osteoporosis   . Pulmonary embolism ([place]) 12/2010   on blood thinners  . Stroke ([place])   . Weight loss     Past Surgical History:  Procedure Laterality Date  . ANKLE SURGERY     Patient states that he had pins place in the left foot  . BACK SURGERY  2008  . CARPAL TUNNEL RELEASE  08/2011   Left hand  . COLONOSCOPY  01/2011  . ORIF HIP FRACTURE  10/31/2011   Procedure: OPEN REDUCTION INTERNAL FIXATION HIP;  Surgeon: Arther Abbott, MD;  Location: [facility];  Service: Orthopedics;  Laterality: Right;  with Gamma Nail  . TRANSURETHRAL RESECTION OF PROSTATE  1984  . UPPER GASTROINTESTINAL ENDOSCOPY  01/2011    Social History   Socioeconomic History  . Marital status: Divorced    Spouse name: Not on file  . Number of children: Not on file  . Years of education: Not on file  . Highest education level: Not on file  Occupational History  . Not on file  Social Needs  . Financial resource strain: Not on file  . Food insecurity:    Worry: Not on file    Inability: Not on file  . Transportation needs:    Medical: Not on file    Non-medical: Not on file  Tobacco Use  . Smoking status: Former Smoker    Years: 20.00   Types: Cigars    Last attempt to quit: 10/27/1981    Years since quitting: 37.4  . Smokeless tobacco: Never Used  Substance and Sexual Activity  . Alcohol use: No    Alcohol/week: 0.0 standard drinks    Comment: Drinks Scotch 3 times per week prior to [employer]- hasn't drank x 1 month  . Drug use: No  . Sexual activity: Never  Lifestyle  . Physical activity:    Days per week: Not on file    Minutes per session: Not on file  . Stress: Not on file  Relationships  . Social connections:    Talks on phone: Not on file    Gets together: Not on file    Attends religious service: Not on file    Active member of club or organization: Not on file    Attends meetings of clubs or organizations: Not on file    Relationship status: Not on file  . Intimate partner violence:    Fear of current or ex partner: Not on file    Emotionally abused: Not on file    Physically abused: Not on file    Forced sexual activity: Not  on file  Other Topics Concern  . Not on file  Social History Narrative   Pt lives in 1 story home with his son and sons family   Has 3 adult children   High school graduate   Retired Regulatory affairs officer   Family History  Problem Relation Age of Onset  . Diabetes Mother   . Diabetes Father   . Diabetes Sister   . Diabetes Brother   . Diabetes Sister   . Diabetes Son   . Healthy Son   . Healthy Son       VITAL SIGNS Ht _0  (1.702 m)   Wt 132 lb 1.6 oz (59.9 kg)   BMI 20.69 kg/m   Outpatient Encounter Medications as of 03/19/2019  Medication Sig  . Balsam Peru-Castor Oil (VENELEX) OINT Apply liberal amount to sacrum & bilateral buttocks qshift & prn incotinence for prevention  . Bismuth Tribromoph-Petrolatum (XEROFORM OIL EMULSION GAUZE EX) Clean back of left hand / BUE with N/S and apply xeroform gauze and wrap with kling.  Change daily and as needed until resoloved  . citalopram (CELEXA) 40 MG tablet Take 40 mg by mouth daily.  . clonazePAM (KLONOPIN) 0.5 MG  tablet Take 1 tablet (0.5 mg total) by mouth 2 (two) times daily as needed (agitiation).  . ezetimibe (ZETIA) 10 MG tablet Take 10 mg by mouth at bedtime.  . Glucerna (GLUCERNA) LIQD Take 237 mLs by mouth 2 (two) times daily between meals.  . insulin lispro (HUMALOG KWIKPEN) 100 UNIT/ML KwikPen Inject 0-0.06 mLs (0-6 Units total) into the skin 3 (three) times daily. CBG 151 - 200 (dose in units): 2 CBG 201 - 250 (dose in units): 3 CBG 251 - 300 (dose in units): 4 CBG 301 - 350 (dose in units): 5 CBG 351 - 400 (dose in units): 6  . latanoprost (XALATAN) 0.005 % ophthalmic solution Place 1 drop into both eyes at bedtime.   . NON FORMULARY Diet Type:  Downgrade patient to dysphagia 3 chopped, continue thin liquids  Consistent CHO snack at bedtime  . Ostomy Supplies (SKIN PREP WIPES) MISC Apply to bilateral heels every shift for preventions  . polyethylene glycol (MIRALAX / GLYCOLAX) 17 g packet Take 34 gm by mouth once a morning in 6 oz of liquid and take 17 gm with 4 oz of liquid once a day prn  . TRESIBA FLEXTOUCH 100 UNIT/ML SOPN FlexTouch Pen 6 Units at bedtime.   . vitamin B-12 (CYANOCOBALAMIN) 1000 MCG tablet Take 1,000 mcg by mouth every morning.   . Vitamin D, Ergocalciferol, (DRISDOL) 50000 UNITS CAPS Take 50,000 Units by mouth every Wednesday.   . [DISCONTINUED] amoxicillin-clavulanate (AUGMENTIN) 875-125 MG tablet Take 1 tablet by mouth every 12 (twelve) hours. (Patient not taking: Reported on 03/19/2019)  . [DISCONTINUED] tamsulosin (FLOMAX) 0.4 MG CAPS capsule Take 0.4 mg by mouth daily.   No facility-administered encounter medications on file as of 03/19/2019.      SIGNIFICANT DIAGNOSTIC EXAMS   PREVIOUS:   03-03-19: ct of head:  1. High attenuating extra-axial collection along the left temporal lobe which may represent a subdural or epidural hemorrhage. Close follow-up with CT recommended. 2. Age-related atrophy and chronic microvascular ischemic changes. 3. Paranasal sinus  disease.  03-03-19: chest x-ray:  1. Cardiomegaly with low lung volumes. Bibasilar subsegmental atelectasis. 2. No consolidation or edema.  03-04-19: ct of head:  No worsening since yesterday. Extra-axial hematoma in the left lateral frontotemporal convexity shows maximal thickness of 9 mm today,  measured at 10 mm yesterday. No evidence of additional bleeding. No mass effect. This could be either subdural or epidural, but is not enlarging or worsening.  03-08-19: chest x-ray: Increased patchy airspace opacities at both lung bases with possible small pleural effusions, consistent with aspiration.  NO NEW EXAMS   LABS REVIEWED PREVIOUS:   03-03-19: wbc 15.9; hgb 12.1; hct 37.7; mcv 92.2; plt 336; glucose 307; bun 26; creat 1.31 ;k+ 3.7; na++ 133; ca 8.6; liver normal albumin 2.9 03-04-19: wbc 11.9; hgb 10.8; hct 32.7; mcv 92.1; plt 323; glucose 411 bun 21; creat 1.39; k+ 4.4; na++ 132; ca 8.4; liver normal albumin 2.9 hgb a1c 7.8  03-07-19: hgb 11.3; hct 35.6; glucose 256; bun 17; creat 1.03 ;k+ 4.7; na++ 138; ca 8.4   LABS REVIEWED TODAY;  03-19-19: wbc 6.7; hgb 9.6; hct 30.1; mcv 92.6; plt 239; glucose 281; bun 29; creat 1.19; k+ 4.1; na++ 137; ca 7.9; alk phos 148; albumin 2.2    Review of Systems  Constitutional: Negative for malaise/fatigue.  Respiratory: Negative for cough and shortness of breath.   Cardiovascular: Negative for chest pain, palpitations and leg swelling.  Gastrointestinal: Negative for abdominal pain, constipation and heartburn.  Musculoskeletal: Negative for back pain, joint pain and myalgias.  Skin: Negative.   Neurological: Positive for dizziness.  Psychiatric/Behavioral: The patient is not nervous/anxious.      Physical Exam Constitutional:      General: He is not in acute distress.    Appearance: He is well-developed. He is not diaphoretic.     Comments: thin  Eyes:     Comments: Legally blind   Neck:     Thyroid: No thyromegaly.  Cardiovascular:     Rate and  Rhythm: Normal rate and regular rhythm.     Heart sounds: Normal heart sounds.  Pulmonary:     Effort: Pulmonary effort is normal. No respiratory distress.     Breath sounds: Normal breath sounds.  Abdominal:     General: Bowel sounds are normal. There is no distension.     Palpations: Abdomen is soft.     Tenderness: There is no abdominal tenderness.  Genitourinary:    Comments: History of TURP Has foley  Musculoskeletal:     Right lower leg: No edema.     Left lower leg: No edema.     Comments:  Is able to move all extremities History of left ankle pinning History of ORIF right hip     Lymphadenopathy:     Cervical: No cervical adenopathy.  Skin:    General: Skin is warm and dry.     Comments: Poor skin turgor   Neurological:     Mental Status: He is alert. Mental status is at baseline.  Psychiatric:        Mood and Affect: Mood normal.      ASSESSMENT/ PLAN:  TODAY:   1. Normocytic anemia 2. Type 1 diabetes mellitus with stage 3 chronic kidney disease with long term current use of insulin 3. Acute renal failure unspecified acute renal failure type  Will have staff get orthostatic vital signs Will give 3 liter of NS at 75 cc per hour tums ES three times daily for low calcium Will increase tresiba to 10 units nighlt  Will check iron studies cmp on Monday.    MD is aware of resident's narcotic use and is in agreement with current plan of care. We will attempt to wean resident as apropriate   Ok Edwards NP [facility]  Adult Medicine  Contact [phone] Monday through Friday 8am- 5pm  After hours call [phone]

## 2019-03-22 ENCOUNTER — Non-Acute Institutional Stay (SKILLED_NURSING_FACILITY): Payer: Medicare Other | Admitting: Adult Health

## 2019-03-22 ENCOUNTER — Encounter: Payer: Self-pay | Admitting: Adult Health

## 2019-03-22 DIAGNOSIS — S065XAA Traumatic subdural hemorrhage with loss of consciousness status unknown, initial encounter: Secondary | ICD-10-CM

## 2019-03-22 DIAGNOSIS — R42 Dizziness and giddiness: Secondary | ICD-10-CM | POA: Diagnosis not present

## 2019-03-22 DIAGNOSIS — S065X9A Traumatic subdural hemorrhage with loss of consciousness of unspecified duration, initial encounter: Secondary | ICD-10-CM

## 2019-03-22 NOTE — Progress Notes (Signed)
Location:   [place] Room Number: 155 P Place of Service:  SNF (31)   CODE STATUS: DNR  No Known Allergies  Chief Complaint  Patient presents with  . Acute Visit    Follow up IV fluids    HPI:  He has completed his IVF. His blood pressure readings are stable. He states that he is feeling better; is able to get out of bed. She still have vertigo present. He denies any headaches; no chest pain no shortness of breath. No changes in appetite. No reports of fevers present.    Past Medical History:  Diagnosis Date  . Ataxia   . Chronic anticoagulation   . Diabetes mellitus   . DVT (deep venous thrombosis) (HCC)    recurrent  . Falls   . Gastroparesis   . Hypertension   . Legally blind    diabetic retinopathy  . Mild dementia ([place])   . Neuropathy   . Osteoporosis   . Pulmonary embolism ([place]) 12/2010   on blood thinners  . Stroke ([place])   . Weight loss     Past Surgical History:  Procedure Laterality Date  . ANKLE SURGERY     Patient states that he had pins place in the left foot  . BACK SURGERY  2008  . CARPAL TUNNEL RELEASE  08/2011   Left hand  . COLONOSCOPY  01/2011  . ORIF HIP FRACTURE  10/31/2011   Procedure: OPEN REDUCTION INTERNAL FIXATION HIP;  Surgeon: Arther Abbott, MD;  Location: [facility];  Service: Orthopedics;  Laterality: Right;  with Gamma Nail  . TRANSURETHRAL RESECTION OF PROSTATE  1984  . UPPER GASTROINTESTINAL ENDOSCOPY  01/2011    Social History   Socioeconomic History  . Marital status: Divorced    Spouse name: Not on file  . Number of children: Not on file  . Years of education: Not on file  . Highest education level: Not on file  Occupational History  . Not on file  Social Needs  . Financial resource strain: Not on file  . Food insecurity:    Worry: Not on file    Inability: Not on file  . Transportation needs:    Medical: Not on file    Non-medical: Not on file  Tobacco Use  . Smoking status:  Former Smoker    Years: 20.00    Types: Cigars    Last attempt to quit: 10/27/1981    Years since quitting: 37.4  . Smokeless tobacco: Never Used  Substance and Sexual Activity  . Alcohol use: No    Alcohol/week: 0.0 standard drinks    Comment: Drinks Scotch 3 times per week prior to [employer]- hasn't drank x 1 month  . Drug use: No  . Sexual activity: Never  Lifestyle  . Physical activity:    Days per week: Not on file    Minutes per session: Not on file  . Stress: Not on file  Relationships  . Social connections:    Talks on phone: Not on file    Gets together: Not on file    Attends religious service: Not on file    Active member of club or organization: Not on file    Attends meetings of clubs or organizations: Not on file    Relationship status: Not on file  . Intimate partner violence:    Fear of current or ex partner: Not on file    Emotionally abused: Not on file  Physically abused: Not on file    Forced sexual activity: Not on file  Other Topics Concern  . Not on file  Social History Narrative   Pt lives in 1 story home with his son and sons family   Has 3 adult children   High school graduate   Retired Regulatory affairs officer   Family History  Problem Relation Age of Onset  . Diabetes Mother   . Diabetes Father   . Diabetes Sister   . Diabetes Brother   . Diabetes Sister   . Diabetes Son   . Healthy Son   . Healthy Son       VITAL SIGNS BP 111/65   Pulse 100   Temp (!) 96.8 F (36 C)   Resp 20   Ht _0  (1.702 m)   Wt 139 lb 3.2 oz (63.1 kg)   SpO2 96%   BMI 21.80 kg/m   Outpatient Encounter Medications as of 03/22/2019  Medication Sig  . acetaminophen (TYLENOL) 500 MG tablet Take 1,000 mg by mouth every 8 (eight) hours.  Roseanne Kaufman Peru-Castor Oil (VENELEX) OINT Apply liberal amount to sacrum & bilateral buttocks qshift & prn incotinence for prevention  . Bismuth Tribromoph-Petrolatum (XEROFORM OIL EMULSION GAUZE EX) Clean back of left hand / BUE  with N/S and apply xeroform gauze and wrap with kling.  Change daily and as needed until resoloved  . calcium carbonate (TUMS E-X 750) 750 MG chewable tablet Chew 1 tablet by mouth 3 (three) times daily.  . citalopram (CELEXA) 40 MG tablet Take 40 mg by mouth daily.  Marland Kitchen ezetimibe (ZETIA) 10 MG tablet Take 10 mg by mouth at bedtime.  . Glucerna (GLUCERNA) LIQD Take 237 mLs by mouth 2 (two) times daily between meals.  . insulin lispro (HUMALOG KWIKPEN) 100 UNIT/ML KwikPen Inject 0-0.06 mLs (0-6 Units total) into the skin 3 (three) times daily. CBG 151 - 200 (dose in units): 2 CBG 201 - 250 (dose in units): 3 CBG 251 - 300 (dose in units): 4 CBG 301 - 350 (dose in units): 5 CBG 351 - 400 (dose in units): 6  . latanoprost (XALATAN) 0.005 % ophthalmic solution Place 1 drop into both eyes at bedtime.   . NON FORMULARY Diet Type:  Downgrade patient to dysphagia 3 chopped, continue thin liquids No Straws due to aspiration risk Consistent CHO snack at bedtime  . Ostomy Supplies (SKIN PREP WIPES) MISC Apply to bilateral heels every shift for preventions  . polyethylene glycol (MIRALAX / GLYCOLAX) 17 g packet Take 34 gm by mouth once a morning in 6 oz of liquid and take 17 gm with 4 oz of liquid once a day prn  . TRESIBA FLEXTOUCH 100 UNIT/ML SOPN FlexTouch Pen 10 Units at bedtime.   . vitamin B-12 (CYANOCOBALAMIN) 1000 MCG tablet Take 1,000 mcg by mouth every morning.   . Vitamin D, Ergocalciferol, (DRISDOL) 50000 UNITS CAPS Take 50,000 Units by mouth every Wednesday.   . [DISCONTINUED] clonazePAM (KLONOPIN) 0.5 MG tablet Take 1 tablet (0.5 mg total) by mouth 2 (two) times daily as needed (agitiation). (Patient not taking: Reported on 03/22/2019)  . [DISCONTINUED] Sodium Chloride Flush (NORMAL SALINE FLUSH) 0.9 % SOLN Inject into the vein. 0.9 NS IVF at 75 cc per hour for 3 liters for acute renal failure   No facility-administered encounter medications on file as of 03/22/2019.      SIGNIFICANT  DIAGNOSTIC EXAMS   PREVIOUS:   03-03-19: ct of head:  1. High  attenuating extra-axial collection along the left temporal lobe which may represent a subdural or epidural hemorrhage. Close follow-up with CT recommended. 2. Age-related atrophy and chronic microvascular ischemic changes. 3. Paranasal sinus disease.  03-03-19: chest x-ray:  1. Cardiomegaly with low lung volumes. Bibasilar subsegmental atelectasis. 2. No consolidation or edema.  03-04-19: ct of head:  No worsening since yesterday. Extra-axial hematoma in the left lateral frontotemporal convexity shows maximal thickness of 9 mm today, measured at 10 mm yesterday. No evidence of additional bleeding. No mass effect. This could be either subdural or epidural, but is not enlarging or worsening.  03-08-19: chest x-ray: Increased patchy airspace opacities at both lung bases with possible small pleural effusions, consistent with aspiration.  NO NEW EXAMS   LABS REVIEWED PREVIOUS:   03-03-19: wbc 15.9; hgb 12.1; hct 37.7; mcv 92.2; plt 336; glucose 307; bun 26; creat 1.31 ;k+ 3.7; na++ 133; ca 8.6; liver normal albumin 2.9 03-04-19: wbc 11.9; hgb 10.8; hct 32.7; mcv 92.1; plt 323; glucose 411 bun 21; creat 1.39; k+ 4.4; na++ 132; ca 8.4; liver normal albumin 2.9 hgb a1c 7.8  03-07-19: hgb 11.3; hct 35.6; glucose 256; bun 17; creat 1.03 ;k+ 4.7; na++ 138; ca 8.4  03-19-19: wbc 6.7; hgb 9.6; hct 30.1; mcv 92.6; plt 239; glucose 281; bun 29; creat 1.19; k+ 4.1; na++ 137; ca 7.9; alk phos 148; albumin 2.2   NO NEW LABS.   Review of Systems  Constitutional: Negative for malaise/fatigue.  Respiratory: Negative for cough and shortness of breath.   Cardiovascular: Negative for chest pain, palpitations and leg swelling.  Gastrointestinal: Negative for abdominal pain, constipation and heartburn.  Musculoskeletal: Negative for back pain, joint pain and myalgias.  Skin: Negative.   Neurological: Positive for dizziness.  Psychiatric/Behavioral: The  patient is not nervous/anxious.     Physical Exam Constitutional:      General: He is not in acute distress.    Appearance: He is well-developed. He is not diaphoretic.     Comments: thin  Eyes:     Comments: Legally blind   Neck:     Musculoskeletal: Neck supple.     Thyroid: No thyromegaly.  Cardiovascular:     Rate and Rhythm: Normal rate and regular rhythm.     Pulses: Normal pulses.     Heart sounds: Normal heart sounds.  Pulmonary:     Effort: Pulmonary effort is normal. No respiratory distress.     Breath sounds: Normal breath sounds.  Abdominal:     General: Bowel sounds are normal. There is no distension.     Palpations: Abdomen is soft.     Tenderness: There is no abdominal tenderness.  Genitourinary:    Comments:  History of TURP Has foley  Musculoskeletal:     Right lower leg: No edema.     Left lower leg: No edema.     Comments: Is able to move all extremities History of left ankle pinning History of ORIF right hip      Lymphadenopathy:     Cervical: No cervical adenopathy.  Skin:    General: Skin is warm and dry.  Neurological:     Mental Status: He is alert. Mental status is at baseline.  Psychiatric:        Mood and Affect: Mood normal.     ASSESSMENT/ PLAN:  TODAY:   1. Vertigo 2. Subdural hematoma  Will begin meclizine 12.5 mg every 6 hours as needed through 03-29-19  Will repeat BMP   MD  is aware of resident's narcotic use and is in agreement with current plan of care. We will attempt to wean resident as apropriate   Ok Edwards NP [facility] Adult Medicine  Contact [phone] Monday through Friday 8am- 5pm  After hours call [phone]

## 2019-03-23 ENCOUNTER — Encounter (HOSPITAL_COMMUNITY)
Admission: RE | Admit: 2019-03-23 | Discharge: 2019-03-23 | Disposition: A | Discharge status: Home / Self Care | Payer: Medicare Other | Source: Skilled Nursing Facility | Attending: Adult Health | Admitting: Adult Health

## 2019-03-23 DIAGNOSIS — E1022 Type 1 diabetes mellitus with diabetic chronic kidney disease: Secondary | ICD-10-CM | POA: Insufficient documentation

## 2019-03-23 DIAGNOSIS — F339 Major depressive disorder, recurrent, unspecified: Secondary | ICD-10-CM | POA: Insufficient documentation

## 2019-03-23 DIAGNOSIS — Z794 Long term (current) use of insulin: Secondary | ICD-10-CM

## 2019-03-23 DIAGNOSIS — I6200 Nontraumatic subdural hemorrhage, unspecified: Secondary | ICD-10-CM | POA: Insufficient documentation

## 2019-03-23 DIAGNOSIS — N401 Enlarged prostate with lower urinary tract symptoms: Secondary | ICD-10-CM

## 2019-03-23 DIAGNOSIS — E785 Hyperlipidemia, unspecified: Secondary | ICD-10-CM

## 2019-03-23 DIAGNOSIS — E1169 Type 2 diabetes mellitus with other specified complication: Secondary | ICD-10-CM | POA: Insufficient documentation

## 2019-03-23 DIAGNOSIS — E1122 Type 2 diabetes mellitus with diabetic chronic kidney disease: Secondary | ICD-10-CM | POA: Insufficient documentation

## 2019-03-23 DIAGNOSIS — N138 Other obstructive and reflux uropathy: Secondary | ICD-10-CM | POA: Insufficient documentation

## 2019-03-23 DIAGNOSIS — N183 Chronic kidney disease, stage 3 unspecified: Secondary | ICD-10-CM | POA: Insufficient documentation

## 2019-03-23 DIAGNOSIS — B370 Candidal stomatitis: Secondary | ICD-10-CM | POA: Diagnosis not present

## 2019-03-23 DIAGNOSIS — R339 Retention of urine, unspecified: Secondary | ICD-10-CM | POA: Insufficient documentation

## 2019-03-23 DIAGNOSIS — E10311 Type 1 diabetes mellitus with unspecified diabetic retinopathy with macular edema: Secondary | ICD-10-CM | POA: Insufficient documentation

## 2019-03-23 DIAGNOSIS — D6489 Other specified anemias: Secondary | ICD-10-CM | POA: Insufficient documentation

## 2019-03-23 DIAGNOSIS — E46 Unspecified protein-calorie malnutrition: Secondary | ICD-10-CM | POA: Insufficient documentation

## 2019-03-23 DIAGNOSIS — S32591A Other specified fracture of right pubis, initial encounter for closed fracture: Secondary | ICD-10-CM | POA: Diagnosis not present

## 2019-03-23 DIAGNOSIS — E1069 Type 1 diabetes mellitus with other specified complication: Secondary | ICD-10-CM | POA: Insufficient documentation

## 2019-03-23 DIAGNOSIS — E559 Vitamin D deficiency, unspecified: Secondary | ICD-10-CM | POA: Insufficient documentation

## 2019-03-23 LAB — BASIC METABOLIC PANEL
Anion gap: 7 (ref 5–15)
BUN: 30 mg/dL — ABNORMAL HIGH (ref 8–23)
CO2: 28 mmol/L (ref 22–32)
Calcium: 8.4 mg/dL — ABNORMAL LOW (ref 8.9–10.3)
Chloride: 102 mmol/L (ref 98–111)
Creatinine, Ser: 1.09 mg/dL (ref 0.61–1.24)
GFR calc Af Amer: >60 mL/min (ref >60)
GFR calc non Af Amer: 59 mL/min — ABNORMAL LOW (ref >60)
Glucose, Bld: 247 mg/dL — ABNORMAL HIGH (ref 70–99)
Potassium: 3.7 mmol/L (ref 3.5–5.1)
Sodium: 137 mmol/L (ref 135–145)

## 2019-03-24 ENCOUNTER — Non-Acute Institutional Stay (SKILLED_NURSING_FACILITY): Payer: Medicare Other | Admitting: Adult Health

## 2019-03-24 ENCOUNTER — Encounter: Payer: Self-pay | Admitting: Adult Health

## 2019-03-24 ENCOUNTER — Other Ambulatory Visit: Payer: Self-pay | Admitting: *Deleted

## 2019-03-24 DIAGNOSIS — S065XAA Traumatic subdural hemorrhage with loss of consciousness status unknown, initial encounter: Secondary | ICD-10-CM

## 2019-03-24 DIAGNOSIS — S065X9A Traumatic subdural hemorrhage with loss of consciousness of unspecified duration, initial encounter: Secondary | ICD-10-CM

## 2019-03-24 DIAGNOSIS — R42 Dizziness and giddiness: Secondary | ICD-10-CM | POA: Diagnosis not present

## 2019-03-24 NOTE — Patient Outreach (Signed)
[facility]) Care Management  03/24/2019  JAYREN CEASE Apr 27, 1926 [id number]   Collaboration with [facility]. Mr. Gappa remains at [facility] SN[facility] receiving therapy.  Writer made aware that facility will have care planning meeting with son today. Will await the outcome of the dc planning meeting.   Will continue to follow along and collaborate with [facility] for potential [place] [facility] needs.  Member has a history of anemia, DM, DVT, PE, HTN, recurrent falls, HLD, legally blind, CKD stage 3.  Will make [place] Management referral if disposition plans are for home.    Marthenia Rolling, MSN-Ed, RN,BSN [place] Acute Care Coordinator [phone]

## 2019-03-24 NOTE — Progress Notes (Signed)
Location:    [facility] Room Number: 155/P Place of Service:  SNF (31)   CODE STATUS: DNR  No Known Allergies  Chief Complaint  Patient presents with  . Acute Visit    Care Plan Meeting    HPI:  We have come together for his routine care plan meeting. More than likely this represents long term placement for him; he will need 24 hour care. He continues to have vertigo awaiting ct of head. Per his family he broke 3 toes on his right foot prior to his hospitalization. He has had 2 falls and has a skin tear present. He does not have pain; he has a good appetite; no reports of anxiety present.   Past Medical History:  Diagnosis Date  . Ataxia   . Chronic anticoagulation   . Diabetes mellitus   . DVT (deep venous thrombosis) (HCC)    recurrent  . Falls   . Gastroparesis   . Hypertension   . Legally blind    diabetic retinopathy  . Mild dementia ([place])   . Neuropathy   . Osteoporosis   . Pulmonary embolism ([place]) 12/2010   on blood thinners  . Stroke ([place])   . Weight loss     Past Surgical History:  Procedure Laterality Date  . ANKLE SURGERY     Patient states that he had pins place in the left foot  . BACK SURGERY  2008  . CARPAL TUNNEL RELEASE  08/2011   Left hand  . COLONOSCOPY  01/2011  . ORIF HIP FRACTURE  10/31/2011   Procedure: OPEN REDUCTION INTERNAL FIXATION HIP;  Surgeon: Arther Abbott, MD;  Location: [facility];  Service: Orthopedics;  Laterality: Right;  with Gamma Nail  . TRANSURETHRAL RESECTION OF PROSTATE  1984  . UPPER GASTROINTESTINAL ENDOSCOPY  01/2011    Social History   Socioeconomic History  . Marital status: Divorced    Spouse name: Not on file  . Number of children: Not on file  . Years of education: Not on file  . Highest education level: Not on file  Occupational History  . Not on file  Social Needs  . Financial resource strain: Not on file  . Food insecurity:    Worry: Not on file    Inability: Not on file  .  Transportation needs:    Medical: Not on file    Non-medical: Not on file  Tobacco Use  . Smoking status: Former Smoker    Years: 20.00    Types: Cigars    Last attempt to quit: 10/27/1981    Years since quitting: 37.4  . Smokeless tobacco: Never Used  Substance and Sexual Activity  . Alcohol use: No    Alcohol/week: 0.0 standard drinks    Comment: Drinks Scotch 3 times per week prior to [employer]- hasn't drank x 1 month  . Drug use: No  . Sexual activity: Never  Lifestyle  . Physical activity:    Days per week: Not on file    Minutes per session: Not on file  . Stress: Not on file  Relationships  . Social connections:    Talks on phone: Not on file    Gets together: Not on file    Attends religious service: Not on file    Active member of club or organization: Not on file    Attends meetings of clubs or organizations: Not on file    Relationship status: Not on file  . Intimate partner violence:  Fear of current or ex partner: Not on file    Emotionally abused: Not on file    Physically abused: Not on file    Forced sexual activity: Not on file  Other Topics Concern  . Not on file  Social History Narrative   Pt lives in 1 story home with his son and sons family   Has 3 adult children   High school graduate   Retired Regulatory affairs officer   Family History  Problem Relation Age of Onset  . Diabetes Mother   . Diabetes Father   . Diabetes Sister   . Diabetes Brother   . Diabetes Sister   . Diabetes Son   . Healthy Son   . Healthy Son       VITAL SIGNS BP 135/89   Pulse 74   Temp (!) 96.6 F (35.9 C) (Oral)   Resp 20   Ht '5\' 7"'$  (1.702 m)   Wt 139 lb 3.2 oz (63.1 kg)   SpO2 94%   BMI 21.80 kg/m   Outpatient Encounter Medications as of 03/24/2019  Medication Sig  . acetaminophen (TYLENOL) 500 MG tablet Take 1,000 mg by mouth every 8 (eight) hours.  Roseanne Kaufman [place]-Castor Oil (VENELEX) OINT Apply liberal amount to sacrum & bilateral buttocks qshift & prn  incotinence for prevention  . Bismuth Tribromoph-Petrolatum (XEROFORM OIL EMULSION GAUZE EX) Clean back of left hand / BUE with N/S and apply xeroform gauze and wrap with kling.  Change daily and as needed until resoloved  . calcium carbonate (TUMS E-X 750) 750 MG chewable tablet Chew 1 tablet by mouth 3 (three) times daily.  . citalopram (CELEXA) 40 MG tablet Take 40 mg by mouth daily.  Marland Kitchen ezetimibe (ZETIA) 10 MG tablet Take 10 mg by mouth at bedtime.  . Glucerna (GLUCERNA) LIQD Take 237 mLs by mouth 2 (two) times daily between meals.  . insulin lispro (HUMALOG KWIKPEN) 100 UNIT/ML KwikPen Inject 0-0.06 mLs (0-6 Units total) into the skin 3 (three) times daily. CBG 151 - 200 (dose in units): 2 CBG 201 - 250 (dose in units): 3 CBG 251 - 300 (dose in units): 4 CBG 301 - 350 (dose in units): 5 CBG 351 - 400 (dose in units): 6  . latanoprost (XALATAN) 0.005 % ophthalmic solution Place 1 drop into both eyes at bedtime.   . meclizine (ANTIVERT) 12.5 MG tablet Take 12.5 mg by mouth every 6 (six) hours as needed for dizziness.  . NON FORMULARY Diet Type:  Downgrade patient to dysphagia 3 chopped, continue thin liquids No Straws due to aspiration risk Consistent CHO snack at bedtime  . Ostomy Supplies (SKIN PREP WIPES) MISC Apply to bilateral heels every shift for preventions  . polyethylene glycol (MIRALAX / GLYCOLAX) 17 g packet Take 34 gm by mouth once a morning in 6 oz of liquid and take 17 gm with 4 oz of liquid once a day prn. May take additional dose once a day prn  . TRESIBA FLEXTOUCH 100 UNIT/ML SOPN FlexTouch Pen 10 Units at bedtime.   . vitamin B-12 (CYANOCOBALAMIN) 1000 MCG tablet Take 1,000 mcg by mouth every morning.   . Vitamin D, Ergocalciferol, (DRISDOL) 50000 UNITS CAPS Take 50,000 Units by mouth every Wednesday.    No facility-administered encounter medications on file as of 03/24/2019.      SIGNIFICANT DIAGNOSTIC EXAMS   PREVIOUS:   03-03-19: ct of head:  1. High attenuating  extra-axial collection along the left temporal lobe which may represent  a subdural or epidural hemorrhage. Close follow-up with CT recommended. 2. Age-related atrophy and chronic microvascular ischemic changes. 3. Paranasal sinus disease.  03-03-19: chest x-ray:  1. Cardiomegaly with low lung volumes. Bibasilar subsegmental atelectasis. 2. No consolidation or edema.  03-04-19: ct of head:  No worsening since yesterday. Extra-axial hematoma in the left lateral frontotemporal convexity shows maximal thickness of 9 mm today, measured at 10 mm yesterday. No evidence of additional bleeding. No mass effect. This could be either subdural or epidural, but is not enlarging or worsening.  03-08-19: chest x-ray: Increased patchy airspace opacities at both lung bases with possible small pleural effusions, consistent with aspiration.  NO NEW EXAMS   LABS REVIEWED PREVIOUS:   03-03-19: wbc 15.9; hgb 12.1; hct 37.7; mcv 92.2; plt 336; glucose 307; bun 26; creat 1.31 ;k+ 3.7; na++ 133; ca 8.6; liver normal albumin 2.9 03-04-19: wbc 11.9; hgb 10.8; hct 32.7; mcv 92.1; plt 323; glucose 411 bun 21; creat 1.39; k+ 4.4; na++ 132; ca 8.4; liver normal albumin 2.9 hgb a1c 7.8  03-07-19: hgb 11.3; hct 35.6; glucose 256; bun 17; creat 1.03 ;k+ 4.7; na++ 138; ca 8.4  03-19-19: wbc 6.7; hgb 9.6; hct 30.1; mcv 92.6; plt 239; glucose 281; bun 29; creat 1.19; k+ 4.1; na++ 137; ca 7.9; alk phos 148; albumin 2.2   NO NEW LABS.   Review of Systems  Constitutional: Negative for malaise/fatigue.  Respiratory: Negative for cough and shortness of breath.   Cardiovascular: Negative for chest pain, palpitations and leg swelling.  Gastrointestinal: Negative for abdominal pain, constipation and heartburn.  Musculoskeletal: Negative for back pain, joint pain and myalgias.  Skin: Negative.   Neurological: Positive for dizziness.  Psychiatric/Behavioral: The patient is not nervous/anxious.      Physical Exam Constitutional:       General: He is not in acute distress.    Appearance: He is well-developed. He is not diaphoretic.     Comments: thin  Eyes:     Comments: Legally blind   Neck:     Thyroid: No thyromegaly.  Cardiovascular:     Rate and Rhythm: Normal rate and regular rhythm.     Heart sounds: Normal heart sounds.  Pulmonary:     Effort: Pulmonary effort is normal. No respiratory distress.     Breath sounds: Normal breath sounds.  Abdominal:     General: Bowel sounds are normal. There is no distension.     Palpations: Abdomen is soft.     Tenderness: There is no abdominal tenderness.  Genitourinary:    Comments: History of TURP Has foley  Musculoskeletal:     Right lower leg: No edema.     Left lower leg: No edema.     Comments:  Is able to move all extremities History of left ankle pinning History of ORIF right hip       Lymphadenopathy:     Cervical: No cervical adenopathy.  Skin:    General: Skin is warm and dry.  Neurological:     Mental Status: He is alert. Mental status is at baseline.  Psychiatric:        Mood and Affect: Mood normal.      ASSESSMENT/ PLAN:  TODAY:   1 subdural hematoma 2. Vertigo   Will await ct scan Will continue current medications Will continue current plan of care Will monitor his status.   MD is aware of resident's narcotic use and is in agreement with current plan of care. We will attempt to  wean resident as apropriate   Ok Edwards NP [facility] Adult [facility]  Contact [phone] Monday through Friday 8am- 5pm  After hours call [phone]

## 2019-03-25 ENCOUNTER — Encounter: Payer: Self-pay | Admitting: Adult Health

## 2019-03-25 ENCOUNTER — Non-Acute Institutional Stay (SKILLED_NURSING_FACILITY): Payer: Medicare Other | Admitting: Adult Health

## 2019-03-25 DIAGNOSIS — S065XAA Traumatic subdural hemorrhage with loss of consciousness status unknown, initial encounter: Secondary | ICD-10-CM

## 2019-03-25 DIAGNOSIS — E1069 Type 1 diabetes mellitus with other specified complication: Secondary | ICD-10-CM | POA: Diagnosis not present

## 2019-03-25 DIAGNOSIS — S065X9A Traumatic subdural hemorrhage with loss of consciousness of unspecified duration, initial encounter: Secondary | ICD-10-CM

## 2019-03-25 DIAGNOSIS — E785 Hyperlipidemia, unspecified: Secondary | ICD-10-CM | POA: Diagnosis not present

## 2019-03-25 DIAGNOSIS — S32591S Other specified fracture of right pubis, sequela: Secondary | ICD-10-CM | POA: Diagnosis not present

## 2019-03-25 DIAGNOSIS — R42 Dizziness and giddiness: Secondary | ICD-10-CM | POA: Insufficient documentation

## 2019-03-25 NOTE — Progress Notes (Signed)
Location:   [facility] Room Number: 155 P Place of Service:  SNF (31)   CODE STATUS: DNR  No Known Allergies  Chief Complaint  Patient presents with  . Medical Management of Chronic Issues    Dyslipidemia due to type 1 diabetes mellitus; subdural hematoma; closed fracture of right inferior pubic ramus sequela. Weekly follow up for the first 30 days post hospitalization.     HPI:  He is a 85 year old long term resident of this facility being seen for the management of his chronic illnesses: dyslipidemia; subdural hematoma pubic fracture. He does have vertigo; which is slightly better. He denies any uncontrolled pain; no anxiety. There are no reports of fevers present.   Past Medical History:  Diagnosis Date  . Ataxia   . Chronic anticoagulation   . Diabetes mellitus   . DVT (deep venous thrombosis) (HCC)    recurrent  . Falls   . Gastroparesis   . Hypertension   . Legally blind    diabetic retinopathy  . Mild dementia ([place])   . Neuropathy   . Osteoporosis   . Pulmonary embolism ([place]) 12/2010   on blood thinners  . Stroke ([place])   . Weight loss     Past Surgical History:  Procedure Laterality Date  . ANKLE SURGERY     Patient states that he had pins place in the left foot  . BACK SURGERY  2008  . CARPAL TUNNEL RELEASE  08/2011   Left hand  . COLONOSCOPY  01/2011  . ORIF HIP FRACTURE  10/31/2011   Procedure: OPEN REDUCTION INTERNAL FIXATION HIP;  Surgeon: Arther Abbott, MD;  Location: [facility];  Service: Orthopedics;  Laterality: Right;  with Gamma Nail  . TRANSURETHRAL RESECTION OF PROSTATE  1984  . UPPER GASTROINTESTINAL ENDOSCOPY  01/2011    Social History   Socioeconomic History  . Marital status: Divorced    Spouse name: Not on file  . Number of children: Not on file  . Years of education: Not on file  . Highest education level: Not on file  Occupational History  . Not on file  Social Needs  . Financial resource strain:  Not on file  . Food insecurity:    Worry: Not on file    Inability: Not on file  . Transportation needs:    Medical: Not on file    Non-medical: Not on file  Tobacco Use  . Smoking status: Former Smoker    Years: 20.00    Types: Cigars    Last attempt to quit: 10/27/1981    Years since quitting: 37.4  . Smokeless tobacco: Never Used  Substance and Sexual Activity  . Alcohol use: No    Alcohol/week: 0.0 standard drinks    Comment: Drinks Scotch 3 times per week prior to [employer]- hasn't drank x 1 month  . Drug use: No  . Sexual activity: Never  Lifestyle  . Physical activity:    Days per week: Not on file    Minutes per session: Not on file  . Stress: Not on file  Relationships  . Social connections:    Talks on phone: Not on file    Gets together: Not on file    Attends religious service: Not on file    Active member of club or organization: Not on file    Attends meetings of clubs or organizations: Not on file    Relationship status: Not on file  . Intimate partner  violence:    Fear of current or ex partner: Not on file    Emotionally abused: Not on file    Physically abused: Not on file    Forced sexual activity: Not on file  Other Topics Concern  . Not on file  Social History Narrative   Pt lives in 1 story home with his son and sons family   Has 3 adult children   High school graduate   Retired Regulatory affairs officer   Family History  Problem Relation Age of Onset  . Diabetes Mother   . Diabetes Father   . Diabetes Sister   . Diabetes Brother   . Diabetes Sister   . Diabetes Son   . Healthy Son   . Healthy Son       VITAL SIGNS BP (!) 169/75   Pulse 75   Temp 97.7 F (36.5 C)   Resp 20   Ht 5' 7" (1.702 m)   Wt 139 lb 3.2 oz (63.1 kg)   SpO2 97%   BMI 21.80 kg/m   Outpatient Encounter Medications as of 03/25/2019  Medication Sig  . acetaminophen (TYLENOL) 500 MG tablet Take 1,000 mg by mouth every 8 (eight) hours.  Roseanne Kaufman Peru-Castor Oil  (VENELEX) OINT Apply liberal amount to sacrum & bilateral buttocks qshift & prn incotinence for prevention  . Bismuth Tribromoph-Petrolatum (XEROFORM OIL EMULSION GAUZE EX) Clean back of left hand / BUE with N/S and apply xeroform gauze and wrap with kling.  Change daily and as needed until resoloved  . calcium carbonate (TUMS E-X 750) 750 MG chewable tablet Chew 1 tablet by mouth 3 (three) times daily.  . citalopram (CELEXA) 40 MG tablet Take 40 mg by mouth daily.  Marland Kitchen ezetimibe (ZETIA) 10 MG tablet Take 10 mg by mouth at bedtime.  . Glucerna (GLUCERNA) LIQD Take 237 mLs by mouth 2 (two) times daily between meals.  . insulin lispro (HUMALOG KWIKPEN) 100 UNIT/ML KwikPen Inject 0-0.06 mLs (0-6 Units total) into the skin 3 (three) times daily. CBG 151 - 200 (dose in units): 2 CBG 201 - 250 (dose in units): 3 CBG 251 - 300 (dose in units): 4 CBG 301 - 350 (dose in units): 5 CBG 351 - 400 (dose in units): 6  . latanoprost (XALATAN) 0.005 % ophthalmic solution Place 1 drop into both eyes at bedtime.   . meclizine (ANTIVERT) 12.5 MG tablet Take 12.5 mg by mouth every 6 (six) hours as needed for dizziness.  . NON FORMULARY Diet Type:  Downgrade patient to dysphagia 3 chopped, continue thin liquids No Straws due to aspiration risk Consistent CHO snack at bedtime  . Ostomy Supplies (SKIN PREP WIPES) MISC Apply to bilateral heels every shift for preventions  . polyethylene glycol (MIRALAX / GLYCOLAX) 17 g packet Take 34 gm by mouth once a morning in 6 oz of liquid and take 17 gm with 4 oz of liquid once a day prn. May take additional dose once a day prn  . TRESIBA FLEXTOUCH 100 UNIT/ML SOPN FlexTouch Pen 10 Units at bedtime.   . vitamin B-12 (CYANOCOBALAMIN) 1000 MCG tablet Take 1,000 mcg by mouth every morning.   . Vitamin D, Ergocalciferol, (DRISDOL) 50000 UNITS CAPS Take 50,000 Units by mouth every Wednesday.    No facility-administered encounter medications on file as of 03/25/2019.      SIGNIFICANT  DIAGNOSTIC EXAMS   PREVIOUS:   03-03-19: ct of head:  1. High attenuating extra-axial collection along the left temporal lobe  which may represent a subdural or epidural hemorrhage. Close follow-up with CT recommended. 2. Age-related atrophy and chronic microvascular ischemic changes. 3. Paranasal sinus disease.  03-03-19: chest x-ray:  1. Cardiomegaly with low lung volumes. Bibasilar subsegmental atelectasis. 2. No consolidation or edema.  03-04-19: ct of head:  No worsening since yesterday. Extra-axial hematoma in the left lateral frontotemporal convexity shows maximal thickness of 9 mm today, measured at 10 mm yesterday. No evidence of additional bleeding. No mass effect. This could be either subdural or epidural, but is not enlarging or worsening.  03-08-19: chest x-ray: Increased patchy airspace opacities at both lung bases with possible small pleural effusions, consistent with aspiration.  NO NEW EXAMS   LABS REVIEWED PREVIOUS:   03-03-19: wbc 15.9; hgb 12.1; hct 37.7; mcv 92.2; plt 336; glucose 307; bun 26; creat 1.31 ;k+ 3.7; na++ 133; ca 8.6; liver normal albumin 2.9 03-04-19: wbc 11.9; hgb 10.8; hct 32.7; mcv 92.1; plt 323; glucose 411 bun 21; creat 1.39; k+ 4.4; na++ 132; ca 8.4; liver normal albumin 2.9 hgb a1c 7.8  03-07-19: hgb 11.3; hct 35.6; glucose 256; bun 17; creat 1.03 ;k+ 4.7; na++ 138; ca 8.4  03-19-19: wbc 6.7; hgb 9.6; hct 30.1; mcv 92.6; plt 239; glucose 281; bun 29; creat 1.19; k+ 4.1; na++ 137; ca 7.9; alk phos 148; albumin 2.2   NO NEW LABS.   Review of Systems  Constitutional: Negative for malaise/fatigue.  Respiratory: Negative for cough and shortness of breath.   Cardiovascular: Negative for chest pain, palpitations and leg swelling.  Gastrointestinal: Negative for abdominal pain, constipation and heartburn.  Musculoskeletal: Negative for back pain, joint pain and myalgias.  Skin: Negative.   Neurological: Positive for dizziness.       Is slightly better    Psychiatric/Behavioral: The patient is not nervous/anxious.     Physical Exam Constitutional:      General: He is not in acute distress.    Appearance: He is well-developed. He is not diaphoretic.     Comments: thin  Eyes:     Comments: Legally blind   Neck:     Musculoskeletal: Neck supple.     Thyroid: No thyromegaly.  Cardiovascular:     Rate and Rhythm: Normal rate and regular rhythm.     Pulses: Normal pulses.     Heart sounds: Normal heart sounds.  Pulmonary:     Effort: Pulmonary effort is normal. No respiratory distress.     Breath sounds: Normal breath sounds.  Abdominal:     General: Bowel sounds are normal. There is no distension.     Palpations: Abdomen is soft.     Tenderness: There is no abdominal tenderness.  Genitourinary:    Comments: Has foley  History of turp Musculoskeletal:     Right lower leg: No edema.     Left lower leg: No edema.     Comments: Is able to move all extremities History of left ankle pinning History of ORIF right hip   Lymphadenopathy:     Cervical: No cervical adenopathy.  Skin:    General: Skin is warm and dry.  Neurological:     Mental Status: He is alert. Mental status is at baseline.  Psychiatric:        Mood and Affect: Mood normal.        ASSESSMENT/ PLAN:  TODAY:  1. Dyslipidemia due to type 1 diabetes mellitus: is stable will continue zetia 10 mg daily   2. Subdural hematoma: no change in status;  has vertigo will continue meclizine 12.5 mg every 6 hour as needed is awaiting ct scan  3. Closed fracture of right inferior pubic ramus sequela: is stable will continue tylenol 1 gm three times daily   PREVIOUS  4. BPH with with urinary obstruction; chronic retention of urine: has chronic foley: will stop flomax due to orthostatic hypotension  5. Major depression recurrent chronic: is stable will continue celexa 40 mg daily is off klonopin   6. Vitamin D deficiency: is stable will continue vit d 50,000 units  weekly   7. Severe protein calorie malnutrition: is without change albumin 2.9 will continue supplements as directed   8. Atrial fibrillation, unspecified type: is stable will continue to monitor his status.   9.CKD stage 3 due to type 1 diabetes mellitus: is stable bun 29; creat 1.19  10. Type 1 diabetes mellitus with stage 3 chronic kidney disease with long term current use of insulin: stable hgb a1c 7.8 will continue tresiba 6 units nightly humalog SSI: 201-250: 3 units; 251-300: 4 units; 301-350: 5 units; 351-400: 6 units. Is followed by endocrinology    MD is aware of resident's narcotic use and is in agreement with current plan of care. We will attempt to wean resident as apropriate   Ok Edwards NP [facility] Adult Medicine  Contact [phone] Monday through Friday 8am- 5pm  After hours call [phone]

## 2019-03-30 ENCOUNTER — Other Ambulatory Visit: Payer: Self-pay | Admitting: *Deleted

## 2019-03-30 NOTE — Patient Outreach (Addendum)
[facility]) Care Management  03/30/2019  Eduardo Lee [birth year] [id number]    Member discussed in telephonic IDT meeting with facility discharge planner and [facility]. [name] remains at [facility] receiving rehab.  Unclear on whether member will stay long term or return home. Family has not yet committed to discharge plan.  Writer will continue to follow for disposition plans and progression.  Will make appropriate [facility] Care Management referral if member returns home at [facility] discharge.  Will continue to collaborate with [facility] RN and facility on this member.    Marthenia Rolling, MSN-Ed, RN,BSN [place] Acute Care Coordinator [phone]

## 2019-04-01 ENCOUNTER — Ambulatory Visit (HOSPITAL_COMMUNITY)
Admission: RE | Admit: 2019-04-01 | Discharge: 2019-04-01 | Disposition: A | Discharge status: Home / Self Care | Payer: Medicare Other | Source: Ambulatory Visit | Attending: Internal Medicine | Admitting: Internal Medicine

## 2019-04-01 ENCOUNTER — Non-Acute Institutional Stay (SKILLED_NURSING_FACILITY): Payer: Medicare Other | Admitting: Adult Health

## 2019-04-01 ENCOUNTER — Encounter: Payer: Self-pay | Admitting: Adult Health

## 2019-04-01 DIAGNOSIS — I6200 Nontraumatic subdural hemorrhage, unspecified: Secondary | ICD-10-CM | POA: Diagnosis not present

## 2019-04-01 DIAGNOSIS — S065X9A Traumatic subdural hemorrhage with loss of consciousness of unspecified duration, initial encounter: Secondary | ICD-10-CM | POA: Insufficient documentation

## 2019-04-01 DIAGNOSIS — E43 Unspecified severe protein-calorie malnutrition: Secondary | ICD-10-CM

## 2019-04-01 DIAGNOSIS — E559 Vitamin D deficiency, unspecified: Secondary | ICD-10-CM | POA: Diagnosis not present

## 2019-04-01 DIAGNOSIS — F339 Major depressive disorder, recurrent, unspecified: Secondary | ICD-10-CM | POA: Diagnosis not present

## 2019-04-01 DIAGNOSIS — X58XXXA Exposure to other specified factors, initial encounter: Secondary | ICD-10-CM | POA: Insufficient documentation

## 2019-04-01 NOTE — Progress Notes (Signed)
Location:   [place] Room Number: 155 P Place of Service:  SNF (31)   CODE STATUS: DNR  No Known Allergies  Chief Complaint  Patient presents with  . Medical Management of Chronic Issues    Major depression recurrent chronic; severe protein calorie malnutrition; vitamin d deficiency     HPI:  He is a 85 year old long term resident of this facility being seen for the management of his chronic illnesses; depression; malnutrition; vit D def. He denies any vertigo at this time. He denies any changes in appetite; no uncontrolled pain or changes in his appetite.   Past Medical History:  Diagnosis Date  . Ataxia   . Chronic anticoagulation   . Diabetes mellitus   . DVT (deep venous thrombosis) (HCC)    recurrent  . Falls   . Gastroparesis   . Hypertension   . Legally blind    diabetic retinopathy  . Mild dementia ([place])   . Neuropathy   . Osteoporosis   . Pulmonary embolism ([place]) 12/2010   on blood thinners  . Stroke ([place])   . Weight loss     Past Surgical History:  Procedure Laterality Date  . ANKLE SURGERY     Patient states that he had pins place in the left foot  . BACK SURGERY  2008  . CARPAL TUNNEL RELEASE  08/2011   Left hand  . COLONOSCOPY  01/2011  . ORIF HIP FRACTURE  10/31/2011   Procedure: OPEN REDUCTION INTERNAL FIXATION HIP;  Surgeon: Arther Abbott, MD;  Location: [facility];  Service: Orthopedics;  Laterality: Right;  with Gamma Nail  . TRANSURETHRAL RESECTION OF PROSTATE  1984  . UPPER GASTROINTESTINAL ENDOSCOPY  01/2011    Social History   Socioeconomic History  . Marital status: Divorced    Spouse name: Not on file  . Number of children: Not on file  . Years of education: Not on file  . Highest education level: Not on file  Occupational History  . Not on file  Social Needs  . Financial resource strain: Not on file  . Food insecurity:    Worry: Not on file    Inability: Not on file  . Transportation needs:    Medical: Not on file    Non-medical: Not on file  Tobacco Use  . Smoking status: Former Smoker    Years: 20.00    Types: Cigars    Last attempt to quit: 10/27/1981    Years since quitting: 37.4  . Smokeless tobacco: Never Used  Substance and Sexual Activity  . Alcohol use: No    Alcohol/week: 0.0 standard drinks    Comment: Drinks Scotch 3 times per week prior to [employer]- hasn't drank x 1 month  . Drug use: No  . Sexual activity: Never  Lifestyle  . Physical activity:    Days per week: Not on file    Minutes per session: Not on file  . Stress: Not on file  Relationships  . Social connections:    Talks on phone: Not on file    Gets together: Not on file    Attends religious service: Not on file    Active member of club or organization: Not on file    Attends meetings of clubs or organizations: Not on file    Relationship status: Not on file  . Intimate partner violence:    Fear of current or ex partner: Not on file    Emotionally abused:  Not on file    Physically abused: Not on file    Forced sexual activity: Not on file  Other Topics Concern  . Not on file  Social History Narrative   Pt lives in 1 story home with his son and sons family   Has 3 adult children   High school graduate   Retired Regulatory affairs officer   Family History  Problem Relation Age of Onset  . Diabetes Mother   . Diabetes Father   . Diabetes Sister   . Diabetes Brother   . Diabetes Sister   . Diabetes Son   . Healthy Son   . Healthy Son       VITAL SIGNS BP (!) 164/76   Pulse 72   Temp 98.2 F (36.8 C)   Resp 20   Ht '5\' 7"'  (1.702 m)   Wt 130 lb 14.4 oz (59.4 kg)   SpO2 98%   BMI 20.50 kg/m   Outpatient Encounter Medications as of 04/01/2019  Medication Sig  . acetaminophen (TYLENOL) 500 MG tablet Take 1,000 mg by mouth every 8 (eight) hours.  Roseanne Kaufman Peru-Castor Oil (VENELEX) OINT Apply liberal amount to sacrum & bilateral buttocks qshift & prn incotinence for prevention  . Bismuth  Tribromoph-Petrolatum (XEROFORM OIL EMULSION GAUZE EX) Clean back of left hand / BUE with N/S and apply xeroform gauze and wrap with kling.  Change daily and as needed until resoloved  . calcium carbonate (TUMS E-X 750) 750 MG chewable tablet Chew 1 tablet by mouth 3 (three) times daily.  . citalopram (CELEXA) 40 MG tablet Take 40 mg by mouth daily.  Marland Kitchen ezetimibe (ZETIA) 10 MG tablet Take 10 mg by mouth at bedtime.  . Glucerna (GLUCERNA) LIQD Take 237 mLs by mouth 2 (two) times daily between meals.  . insulin lispro (HUMALOG KWIKPEN) 100 UNIT/ML KwikPen Inject 0-0.06 mLs (0-6 Units total) into the skin 3 (three) times daily. CBG 151 - 200 (dose in units): 2 CBG 201 - 250 (dose in units): 3 CBG 251 - 300 (dose in units): 4 CBG 301 - 350 (dose in units): 5 CBG 351 - 400 (dose in units): 6  . latanoprost (XALATAN) 0.005 % ophthalmic solution Place 1 drop into both eyes at bedtime.   . meclizine (ANTIVERT) 12.5 MG tablet Take 12.5 mg by mouth every 6 (six) hours as needed for dizziness.  . NON FORMULARY Diet Type:  Downgrade patient to dysphagia 3 chopped, continue thin liquids No Straws due to aspiration risk Consistent CHO snack at bedtime  . Ostomy Supplies (SKIN PREP WIPES) MISC Apply to bilateral heels every shift for preventions  . polyethylene glycol (MIRALAX / GLYCOLAX) 17 g packet Take 34 gm by mouth once a morning in 6 oz of liquid and take 17 gm with 4 oz of liquid once a day prn. May take additional dose once a day prn  . TRESIBA FLEXTOUCH 100 UNIT/ML SOPN FlexTouch Pen 10 Units at bedtime.   . vitamin B-12 (CYANOCOBALAMIN) 1000 MCG tablet Take 1,000 mcg by mouth every morning.   . Vitamin D, Ergocalciferol, (DRISDOL) 50000 UNITS CAPS Take 50,000 Units by mouth every Wednesday.    No facility-administered encounter medications on file as of 04/01/2019.      SIGNIFICANT DIAGNOSTIC EXAMS PREVIOUS:   03-03-19: ct of head:  1. High attenuating extra-axial collection along the left  temporal lobe which may represent a subdural or epidural hemorrhage. Close follow-up with CT recommended. 2. Age-related atrophy and chronic microvascular ischemic  changes. 3. Paranasal sinus disease.  03-03-19: chest x-ray:  1. Cardiomegaly with low lung volumes. Bibasilar subsegmental atelectasis. 2. No consolidation or edema.  03-04-19: ct of head:  No worsening since yesterday. Extra-axial hematoma in the left lateral frontotemporal convexity shows maximal thickness of 9 mm today, measured at 10 mm yesterday. No evidence of additional bleeding. No mass effect. This could be either subdural or epidural, but is not enlarging or worsening.  03-08-19: chest x-ray: Increased patchy airspace opacities at both lung bases with possible small pleural effusions, consistent with aspiration.  TODAY   03-15-19: pelvic x-ray: Nondisplaced RIGHT INFERIOR pubic ramus fracture.  04-01-19: ct of head:  There has been significant interval decrease in a lentiform collection about the left temporal horn and insula, measuring 5 mm in thickness and now low-attenuation, in keeping with resolving subacute to chronic subdural hematoma (series 2, image 15). Small-vessel white matter disease and global volume loss in keeping with patient age.  LABS REVIEWED PREVIOUS:   03-03-19: wbc 15.9; hgb 12.1; hct 37.7; mcv 92.2; plt 336; glucose 307; bun 26; creat 1.31 ;k+ 3.7; na++ 133; ca 8.6; liver normal albumin 2.9 03-04-19: wbc 11.9; hgb 10.8; hct 32.7; mcv 92.1; plt 323; glucose 411 bun 21; creat 1.39; k+ 4.4; na++ 132; ca 8.4; liver normal albumin 2.9 hgb a1c 7.8  03-07-19: hgb 11.3; hct 35.6; glucose 256; bun 17; creat 1.03 ;k+ 4.7; na++ 138; ca 8.4  03-19-19: wbc 6.7; hgb 9.6; hct 30.1; mcv 92.6; plt 239; glucose 281; bun 29; creat 1.19; k+ 4.1; na++ 137; ca 7.9; alk phos 148; albumin 2.2   TODAY:   03-23-19: glucose 247 bun 30; creat 1.09; k+ 3.7; na++ 137; ca 8.4   Review of Systems  Constitutional: Negative for  malaise/fatigue.  Respiratory: Negative for cough and shortness of breath.   Cardiovascular: Negative for chest pain, palpitations and leg swelling.  Gastrointestinal: Negative for abdominal pain, constipation and heartburn.  Musculoskeletal: Negative for back pain, joint pain and myalgias.  Skin: Negative.   Neurological: Negative for dizziness.  Psychiatric/Behavioral: The patient is not nervous/anxious.     Physical Exam Constitutional:      General: He is not in acute distress.    Appearance: He is well-developed. He is not diaphoretic.     Comments: thin  Eyes:     Comments: Legally blind   Neck:     Musculoskeletal: Neck supple.     Thyroid: No thyromegaly.  Cardiovascular:     Rate and Rhythm: Normal rate and regular rhythm.     Pulses: Normal pulses.     Heart sounds: Normal heart sounds.  Pulmonary:     Effort: Pulmonary effort is normal. No respiratory distress.     Breath sounds: Normal breath sounds.  Abdominal:     General: Bowel sounds are normal. There is no distension.     Palpations: Abdomen is soft.     Tenderness: There is no abdominal tenderness.  Genitourinary:    Comments: Has foley  History of turp Musculoskeletal:     Right lower leg: No edema.     Left lower leg: No edema.     Comments: Is able to move all extremities History of left ankle pinning History of ORIF right hip  03-15-19: right inferior pubic ramus fracture   Lymphadenopathy:     Cervical: No cervical adenopathy.  Skin:    General: Skin is warm and dry.  Neurological:     Mental Status: He is alert. Mental  status is at baseline.  Psychiatric:        Mood and Affect: Mood normal.       ASSESSMENT/ PLAN:  TODAY:  1. Major depression recurrent chronic: is stable will continue celexa 40 mg daily is no longer on klonopin  2. Vit D deficiency: is stable will continue vit d 50,000 units weekly   3. Severe protein calorie malnutrition: is without change albumin is 2.9 will  continue supplements as directed weight is 130 pounds.   PREVIOUS  4. BPH with with urinary obstruction; chronic retention of urine: has chronic foley: will monitor   5. Atrial fibrillation, unspecified type: is stable will continue to monitor his status.   6.CKD stage 3 due to type 1 diabetes mellitus: is stable bun 30; creat 1.09  7. Type 1 diabetes mellitus with stage 3 chronic kidney disease with long term current use of insulin: stable hgb a1c 7.8 will continue tresiba 10 units nightly humalog SSI: 201-250: 3 units; 251-300: 4 units; 301-350: 5 units; 351-400: 6 units. Is followed by endocrinology   8. Increased intraocular pressure: is stable will continue xalatan to both eyes.   9. Dyslipidemia due to type 1 diabetes mellitus: is stable will continue zetia 10 mg daily   10. Subdural hematoma: is improving; has less vertigo  will continue meclizine 12.5 mg every 6 hour as needed   11. Closed fracture of right inferior pubic ramus sequela: is stable will continue tylenol 1 gm three times daily   12. Hypocalcemia: ca level 8.4 will continue tums es three times daily    Will check vit D and lipids   MD is aware of resident's narcotic use and is in agreement with current plan of care. We will attempt to wean resident as apropriate   Ok Edwards NP [facility]  Contact [phone] Monday through Friday 8am- 5pm  After hours call [phone]

## 2019-04-02 DEATH — deceased

## 2019-04-06 ENCOUNTER — Encounter (HOSPITAL_COMMUNITY)
Admission: RE | Admit: 2019-04-06 | Discharge: 2019-04-06 | Disposition: A | Discharge status: Home / Self Care | Payer: No Typology Code available for payment source | Source: Skilled Nursing Facility | Attending: Internal Medicine | Admitting: Internal Medicine

## 2019-04-06 ENCOUNTER — Encounter: Payer: Self-pay | Admitting: Adult Health

## 2019-04-06 ENCOUNTER — Other Ambulatory Visit: Payer: Self-pay | Admitting: *Deleted

## 2019-04-06 ENCOUNTER — Non-Acute Institutional Stay (SKILLED_NURSING_FACILITY): Payer: Medicare Other | Admitting: Adult Health

## 2019-04-06 DIAGNOSIS — I6200 Nontraumatic subdural hemorrhage, unspecified: Secondary | ICD-10-CM | POA: Diagnosis not present

## 2019-04-06 DIAGNOSIS — E1022 Type 1 diabetes mellitus with diabetic chronic kidney disease: Secondary | ICD-10-CM | POA: Diagnosis not present

## 2019-04-06 DIAGNOSIS — R339 Retention of urine, unspecified: Secondary | ICD-10-CM | POA: Diagnosis not present

## 2019-04-06 DIAGNOSIS — B370 Candidal stomatitis: Secondary | ICD-10-CM | POA: Diagnosis not present

## 2019-04-06 DIAGNOSIS — E785 Hyperlipidemia, unspecified: Secondary | ICD-10-CM

## 2019-04-06 DIAGNOSIS — N390 Urinary tract infection, site not specified: Secondary | ICD-10-CM | POA: Diagnosis not present

## 2019-04-06 DIAGNOSIS — I129 Hypertensive chronic kidney disease with stage 1 through stage 4 chronic kidney disease, or unspecified chronic kidney disease: Secondary | ICD-10-CM | POA: Diagnosis not present

## 2019-04-06 DIAGNOSIS — E1069 Type 1 diabetes mellitus with other specified complication: Secondary | ICD-10-CM | POA: Diagnosis not present

## 2019-04-06 DIAGNOSIS — N401 Enlarged prostate with lower urinary tract symptoms: Secondary | ICD-10-CM | POA: Diagnosis not present

## 2019-04-06 DIAGNOSIS — N183 Chronic kidney disease, stage 3 (moderate): Secondary | ICD-10-CM | POA: Insufficient documentation

## 2019-04-06 DIAGNOSIS — S32591A Other specified fracture of right pubis, initial encounter for closed fracture: Secondary | ICD-10-CM | POA: Insufficient documentation

## 2019-04-06 DIAGNOSIS — N138 Other obstructive and reflux uropathy: Secondary | ICD-10-CM

## 2019-04-06 LAB — LIPID PANEL
Cholesterol: 144 mg/dL (ref 0–200)
HDL: 47 mg/dL (ref >40)
LDL Cholesterol: 78 mg/dL (ref 0–99)
Total CHOL/HDL Ratio: 3.1 RATIO
Triglycerides: 96 mg/dL (ref <150)
VLDL: 19 mg/dL (ref 0–40)

## 2019-04-06 NOTE — Progress Notes (Signed)
Location:   [place] Room Number: 133 P Place of Service:  SNF (31)   CODE STATUS: DNR  No Known Allergies  Chief Complaint  Patient presents with   Medical Management of Chronic Issues    Hypertension associated with chronic kidney disease due to type 1 diabetes mellitus; dyslipidemia due to type 1 diabetes mellitus; BPH with urinary obstruction; chronic retention of urine.     HPI:  He is a 85 year old long term resident of this facility being seen for the management of his chronic illnesses; hypertension; dyslipidemia; bph; urine retention.  He denies any uncontrolled pain; no vertigo at this time; no changes in appetite; no insomnia. No reports of fevers present.   Past Medical History:  Diagnosis Date   Ataxia    Chronic anticoagulation    Diabetes mellitus    DVT (deep venous thrombosis) (HCC)    recurrent   Falls    Gastroparesis    Hypertension    Legally blind    diabetic retinopathy   Mild dementia ([place])    Neuropathy    Osteoporosis    Pulmonary embolism ([place]) 12/2010   on blood thinners   Stroke ([place])    Weight loss     Past Surgical History:  Procedure Laterality Date   ANKLE SURGERY     Patient states that he had pins place in the left foot   BACK SURGERY  2008   CARPAL TUNNEL RELEASE  08/2011   Left hand   COLONOSCOPY  01/2011   ORIF HIP FRACTURE  10/31/2011   Procedure: OPEN REDUCTION INTERNAL FIXATION HIP;  Surgeon: Arther Abbott, MD;  Location: [facility];  Service: Orthopedics;  Laterality: Right;  with Gamma Nail   TRANSURETHRAL RESECTION OF PROSTATE  1984   UPPER GASTROINTESTINAL ENDOSCOPY  01/2011    Social History   Socioeconomic History   Marital status: Divorced    Spouse name: Not on file   Number of children: Not on file   Years of education: Not on file   Highest education level: Not on file  Occupational History   Not on file  Social Needs   Financial resource  strain: Not on file   Food insecurity:    Worry: Not on file    Inability: Not on file   Transportation needs:    Medical: Not on file    Non-medical: Not on file  Tobacco Use   Smoking status: Former Smoker    Years: 20.00    Types: Cigars    Last attempt to quit: 10/27/1981    Years since quitting: 37.4   Smokeless tobacco: Never Used  Substance and Sexual Activity   Alcohol use: No    Alcohol/week: 0.0 standard drinks    Comment: Drinks Scotch 3 times per week prior to [employer]- hasn't drank x 1 month   Drug use: No   Sexual activity: Never  Lifestyle   Physical activity:    Days per week: Not on file    Minutes per session: Not on file   Stress: Not on file  Relationships   Social connections:    Talks on phone: Not on file    Gets together: Not on file    Attends religious service: Not on file    Active member of club or organization: Not on file    Attends meetings of clubs or organizations: Not on file    Relationship status: Not on file   Intimate partner  violence:    Fear of current or ex partner: Not on file    Emotionally abused: Not on file    Physically abused: Not on file    Forced sexual activity: Not on file  Other Topics Concern   Not on file  Social History Narrative   Pt lives in 1 story home with his son and sons family   Has 3 adult children   High school graduate   Retired Regulatory affairs officer   Family History  Problem Relation Age of Onset   Diabetes Mother    Diabetes Father    Diabetes Sister    Diabetes Brother    Diabetes Sister    Diabetes Son    Healthy Son    Healthy Son       VITAL SIGNS BP (!) 162/76    Pulse 73    Temp (!) 97 F (36.1 C)    Resp (!) 24    Ht '5\' 7"'  (1.702 m)    Wt 126 lb (57.2 kg)    BMI 19.73 kg/m   Outpatient Encounter Medications as of 04/06/2019  Medication Sig   acetaminophen (TYLENOL) 500 MG tablet Take 1,000 mg by mouth every 8 (eight) hours.   Balsam [place]-Castor Oil (VENELEX)  OINT Apply liberal amount to sacrum & bilateral buttocks qshift & prn incotinence for prevention   Bismuth Tribromoph-Petrolatum (XEROFORM OIL EMULSION GAUZE EX) Clean back of left hand / BUE with N/S and apply xeroform gauze and wrap with kling.  Change daily and as needed until resoloved   calcium carbonate (TUMS E-X 750) 750 MG chewable tablet Chew 1 tablet by mouth 3 (three) times daily.   citalopram (CELEXA) 40 MG tablet Take 40 mg by mouth daily.   ezetimibe (ZETIA) 10 MG tablet Take 10 mg by mouth at bedtime.   Glucerna (GLUCERNA) LIQD Take 237 mLs by mouth 2 (two) times daily between meals.   insulin lispro (HUMALOG KWIKPEN) 100 UNIT/ML KwikPen Inject 0-0.06 mLs (0-6 Units total) into the skin 3 (three) times daily. CBG 151 - 200 (dose in units): 2 CBG 201 - 250 (dose in units): 3 CBG 251 - 300 (dose in units): 4 CBG 301 - 350 (dose in units): 5 CBG 351 - 400 (dose in units): 6   latanoprost (XALATAN) 0.005 % ophthalmic solution Place 1 drop into both eyes at bedtime.    NON FORMULARY Diet Type:  Downgrade patient to dysphagia 3 chopped, continue thin liquids No Straws due to aspiration risk Consistent CHO snack at bedtime   Ostomy Supplies (SKIN PREP WIPES) MISC Apply to bilateral heels every shift for preventions   polyethylene glycol (MIRALAX / GLYCOLAX) 17 g packet Take 34 gm by mouth once a morning in 6 oz of liquid and take 17 gm with 4 oz of liquid once a day prn. May take additional dose once a day prn   TRESIBA FLEXTOUCH 100 UNIT/ML SOPN FlexTouch Pen 10 Units at bedtime.    vitamin B-12 (CYANOCOBALAMIN) 1000 MCG tablet Take 1,000 mcg by mouth every morning.    Vitamin D, Ergocalciferol, (DRISDOL) 50000 UNITS CAPS Take 50,000 Units by mouth every Wednesday.    [DISCONTINUED] meclizine (ANTIVERT) 12.5 MG tablet Take 12.5 mg by mouth every 6 (six) hours as needed for dizziness.   No facility-administered encounter medications on file as of 04/06/2019.       SIGNIFICANT DIAGNOSTIC EXAMS  PREVIOUS  03-03-19: ct of head:  1. High attenuating extra-axial collection along the left temporal  lobe which may represent a subdural or epidural hemorrhage. Close follow-up with CT recommended. 2. Age-related atrophy and chronic microvascular ischemic changes. 3. Paranasal sinus disease.  03-03-19: chest x-ray:  1. Cardiomegaly with low lung volumes. Bibasilar subsegmental atelectasis. 2. No consolidation or edema.  03-04-19: ct of head:  No worsening since yesterday. Extra-axial hematoma in the left lateral frontotemporal convexity shows maximal thickness of 9 mm today, measured at 10 mm yesterday. No evidence of additional bleeding. No mass effect. This could be either subdural or epidural, but is not enlarging or worsening.  03-08-19: chest x-ray: Increased patchy airspace opacities at both lung bases with possible small pleural effusions, consistent with aspiration.  03-15-19: pelvic x-ray: Nondisplaced RIGHT INFERIOR pubic ramus fracture.  04-01-19: ct of head:  There has been significant interval decrease in a lentiform collection about the left temporal horn and insula, measuring 5 mm in thickness and now low-attenuation, in keeping with resolving subacute to chronic subdural hematoma (series 2, image 15). Small-vessel white matter disease and global volume loss in keeping with patient age.  NO NEW EXAMS.   LABS REVIEWED PREVIOUS:   03-03-19: wbc 15.9; hgb 12.1; hct 37.7; mcv 92.2; plt 336; glucose 307; bun 26; creat 1.31 ;k+ 3.7; na++ 133; ca 8.6; liver normal albumin 2.9 03-04-19: wbc 11.9; hgb 10.8; hct 32.7; mcv 92.1; plt 323; glucose 411 bun 21; creat 1.39; k+ 4.4; na++ 132; ca 8.4; liver normal albumin 2.9 hgb a1c 7.8  03-07-19: hgb 11.3; hct 35.6; glucose 256; bun 17; creat 1.03 ;k+ 4.7; na++ 138; ca 8.4  03-19-19: wbc 6.7; hgb 9.6; hct 30.1; mcv 92.6; plt 239; glucose 281; bun 29; creat 1.19; k+ 4.1; na++ 137; ca 7.9; alk phos 148; albumin 2.2   03-23-19: glucose 247 bun 30; creat 1.09; k+ 3.7; na++ 137; ca 8.4   TODAY:   04-06-19: chol 144; ldl 78 trig 96; hdl 47    Review of Systems  Constitutional: Negative for malaise/fatigue.  Respiratory: Negative for cough and shortness of breath.   Cardiovascular: Negative for chest pain, palpitations and leg swelling.  Gastrointestinal: Negative for abdominal pain, constipation and heartburn.  Musculoskeletal: Negative for back pain, joint pain and myalgias.  Skin: Negative.   Neurological: Negative for dizziness.  Psychiatric/Behavioral: The patient is not nervous/anxious.    Physical Exam Constitutional:      General: He is not in acute distress.    Appearance: He is well-developed. He is not diaphoretic.     Comments: thin  Eyes:     Comments: Legally blind  Neck:     Musculoskeletal: Neck supple.     Thyroid: No thyromegaly.  Cardiovascular:     Rate and Rhythm: Normal rate and regular rhythm.     Pulses: Normal pulses.     Heart sounds: Normal heart sounds.  Pulmonary:     Effort: Pulmonary effort is normal. No respiratory distress.     Breath sounds: Normal breath sounds.  Abdominal:     General: Bowel sounds are normal. There is no distension.     Palpations: Abdomen is soft.     Tenderness: There is no abdominal tenderness.  Genitourinary:    Comments: Has foley  History of turp Musculoskeletal:     Right lower leg: No edema.     Left lower leg: No edema.     Comments:  Is able to move all extremities History of left ankle pinning History of ORIF right hip  03-15-19: right inferior pubic ramus fracture  Lymphadenopathy:     Cervical: No cervical adenopathy.  Skin:    General: Skin is warm and dry.  Neurological:     Mental Status: He is alert. Mental status is at baseline.  Psychiatric:        Mood and Affect: Mood normal.      ASSESSMENT/ PLAN:  TODAY:  1. Hypertension associated with chronic kidney disease due to type 1 diabetes mellitus: is  worse: b/p 162/76 will begin lisinopril 2.5 mg daily and will repeat bmp in one week.   2. Dyslipidemia due to type 1 diabetes mellitus is stable LDL 78 will continue zetia 10 mg daily   3. BPH with urinary obstruction/chronic retention of urine: has chronic foley will monitor flomax has been stopped due to vertigo   PREVIOUS  4. Atrial fibrillation, unspecified type: is stable will continue to monitor his status.   5. CKD stage 3 due to type 1 diabetes mellitus: is stable bun 30; creat 1.09  6. Type 1 diabetes mellitus with stage 3 chronic kidney disease with long term current use of insulin: stable hgb a1c 7.8 will continue tresiba 10 units nightly humalog SSI: 201-250: 3 units; 251-300: 4 units; 301-350: 5 units; 351-400: 6 units. Is followed by endocrinology   7. Increased intraocular pressure: is stable will continue xalatan to both eyes.   8. Subdural hematoma: is improving; has less vertigo  will continue meclizine 12.5 mg every 6 hour as needed   9. Closed fracture of right inferior pubic ramus sequela: is stable will continue tylenol 1 gm three times daily   10. Hypocalcemia: ca level 8.4 will continue tums es three times daily   11. Major depression recurrent chronic: is stable will continue celexa 40 mg daily is no longer on klonopin  12. Vit D deficiency: is stable will continue vit d 50,000 units weekly   13. Severe protein calorie malnutrition: is without change albumin is 2.9 will continue supplements as directed weight is 126  (previous 130) pounds.    MD is aware of resident's narcotic use and is in agreement with current plan of care. We will attempt to wean resident as apropriate   Ok Edwards NP [facility] Adult [facility]  Contact [phone] Monday through Friday 8am- 5pm  After hours call [phone]

## 2019-04-06 NOTE — Patient Outreach (Signed)
[facility]) Care Management  04/06/2019  Eduardo Lee 11/23/1926 [id number]   Member discussed in telephonic IDT meeting with [facility] facility staff.  Facility reports [name] disposition plan will be transition to long term care at [facility].  Writer to sign off. No identifiable [facility] Care Management needs at this time.     Marthenia Rolling, MSN-Ed, RN,BSN [place] Acute Care Coordinator [phone]

## 2019-04-07 ENCOUNTER — Ambulatory Visit (HOSPITAL_COMMUNITY): Payer: Medicare Other

## 2019-04-07 LAB — VITAMIN D 25 HYDROXY (VIT D DEFICIENCY, FRACTURES): Vit D, 25-Hydroxy: 87.3 ng/mL (ref 30.0–100.0)

## 2019-04-13 ENCOUNTER — Non-Acute Institutional Stay (SKILLED_NURSING_FACILITY): Payer: Medicare Other | Admitting: Adult Health

## 2019-04-13 ENCOUNTER — Encounter: Payer: Self-pay | Admitting: Adult Health

## 2019-04-13 DIAGNOSIS — E1022 Type 1 diabetes mellitus with diabetic chronic kidney disease: Secondary | ICD-10-CM

## 2019-04-13 DIAGNOSIS — N183 Chronic kidney disease, stage 3 (moderate): Secondary | ICD-10-CM

## 2019-04-13 NOTE — Progress Notes (Signed)
Location:   [place] Room Number: 133 P Place of Service:  SNF (31)   CODE STATUS: DNR  No Known Allergies  Chief Complaint  Patient presents with  . Acute Visit    DM    HPI:  His cbg readings have all been elevated. He is presently taking tresiba and humalog insulins. He denies any excessive or hunger. There are no reports of excessive urine output; no reports of fevers present.    Past Medical History:  Diagnosis Date  . Ataxia   . Chronic anticoagulation   . Diabetes mellitus   . DVT (deep venous thrombosis) (HCC)    recurrent  . Falls   . Gastroparesis   . Hypertension   . Legally blind    diabetic retinopathy  . Mild dementia ([place])   . Neuropathy   . Osteoporosis   . Pulmonary embolism ([place]) 12/2010   on blood thinners  . Stroke ([place])   . Weight loss     Past Surgical History:  Procedure Laterality Date  . ANKLE SURGERY     Patient states that he had pins place in the left foot  . BACK SURGERY  2008  . CARPAL TUNNEL RELEASE  08/2011   Left hand  . COLONOSCOPY  01/2011  . ORIF HIP FRACTURE  10/31/2011   Procedure: OPEN REDUCTION INTERNAL FIXATION HIP;  Surgeon: Arther Abbott, MD;  Location: [facility];  Service: Orthopedics;  Laterality: Right;  with Gamma Nail  . TRANSURETHRAL RESECTION OF PROSTATE  1984  . UPPER GASTROINTESTINAL ENDOSCOPY  01/2011    Social History   Socioeconomic History  . Marital status: Divorced    Spouse name: Not on file  . Number of children: Not on file  . Years of education: Not on file  . Highest education level: Not on file  Occupational History  . Not on file  Social Needs  . Financial resource strain: Not on file  . Food insecurity:    Worry: Not on file    Inability: Not on file  . Transportation needs:    Medical: Not on file    Non-medical: Not on file  Tobacco Use  . Smoking status: Former Smoker    Years: 20.00    Types: Cigars    Last attempt to quit: 10/27/1981   Years since quitting: 37.4  . Smokeless tobacco: Never Used  Substance and Sexual Activity  . Alcohol use: No    Alcohol/week: 0.0 standard drinks    Comment: Drinks Scotch 3 times per week prior to [employer]- hasn't drank x 1 month  . Drug use: No  . Sexual activity: Never  Lifestyle  . Physical activity:    Days per week: Not on file    Minutes per session: Not on file  . Stress: Not on file  Relationships  . Social connections:    Talks on phone: Not on file    Gets together: Not on file    Attends religious service: Not on file    Active member of club or organization: Not on file    Attends meetings of clubs or organizations: Not on file    Relationship status: Not on file  . Intimate partner violence:    Fear of current or ex partner: Not on file    Emotionally abused: Not on file    Physically abused: Not on file    Forced sexual activity: Not on file  Other Topics Concern  . Not  on file  Social History Narrative   Pt lives in 1 story home with his son and sons family   Has 3 adult children   High school graduate   Retired Regulatory affairs officer   Family History  Problem Relation Age of Onset  . Diabetes Mother   . Diabetes Father   . Diabetes Sister   . Diabetes Brother   . Diabetes Sister   . Diabetes Son   . Healthy Son   . Healthy Son       VITAL SIGNS BP 112/63   Pulse 74   Temp 97.9 F (36.6 C)   Resp 20   Ht '5\' 7"'  (1.702 m)   Wt 122 lb 3.2 oz (55.4 kg)   BMI 19.14 kg/m   Outpatient Encounter Medications as of 04/13/2019  Medication Sig  . acetaminophen (TYLENOL) 500 MG tablet Take 1,000 mg by mouth every 8 (eight) hours.  Roseanne Kaufman Peru-Castor Oil (VENELEX) OINT Apply liberal amount to sacrum & bilateral buttocks qshift & prn incotinence for prevention  . Bismuth Tribromoph-Petrolatum (XEROFORM OIL EMULSION GAUZE EX) Clean back of left hand / BUE with N/S and apply xeroform gauze and wrap with kling.  Change daily and as needed until resoloved  .  calcium carbonate (TUMS E-X 750) 750 MG chewable tablet Chew 1 tablet by mouth 3 (three) times daily.  . citalopram (CELEXA) 40 MG tablet Take 40 mg by mouth daily.  Marland Kitchen ezetimibe (ZETIA) 10 MG tablet Take 10 mg by mouth at bedtime.  . Glucerna (GLUCERNA) LIQD Take 237 mLs by mouth 2 (two) times daily between meals.  . insulin lispro (HUMALOG KWIKPEN) 100 UNIT/ML KwikPen Inject 0-0.06 mLs (0-6 Units total) into the skin 3 (three) times daily. CBG 151 - 200 (dose in units): 2 CBG 201 - 250 (dose in units): 3 CBG 251 - 300 (dose in units): 4 CBG 301 - 350 (dose in units): 5 CBG 351 - 400 (dose in units): 6  . latanoprost (XALATAN) 0.005 % ophthalmic solution Place 1 drop into both eyes at bedtime.   Marland Kitchen lisinopril (ZESTRIL) 2.5 MG tablet Take 2.5 mg by mouth daily.  . NON FORMULARY Diet Type:  Downgrade patient to dysphagia 3 chopped, continue thin liquids No Straws due to aspiration risk Consistent CHO snack at bedtime  . Ostomy Supplies (SKIN PREP WIPES) MISC Apply to bilateral heels every shift for preventions  . polyethylene glycol (MIRALAX / GLYCOLAX) 17 g packet Take 34 gm by mouth once a morning in 6 oz of liquid and take 17 gm with 4 oz of liquid once a day prn. May take additional dose once a day prn  . TRESIBA FLEXTOUCH 100 UNIT/ML SOPN FlexTouch Pen 10 Units at bedtime.   . vitamin B-12 (CYANOCOBALAMIN) 1000 MCG tablet Take 1,000 mcg by mouth every morning.   . [DISCONTINUED] Vitamin D, Ergocalciferol, (DRISDOL) 50000 UNITS CAPS Take 50,000 Units by mouth every Wednesday.    No facility-administered encounter medications on file as of 04/13/2019.      SIGNIFICANT DIAGNOSTIC EXAMS  PREVIOUS  03-03-19: ct of head:  1. High attenuating extra-axial collection along the left temporal lobe which may represent a subdural or epidural hemorrhage. Close follow-up with CT recommended. 2. Age-related atrophy and chronic microvascular ischemic changes. 3. Paranasal sinus disease.  03-03-19: chest  x-ray:  1. Cardiomegaly with low lung volumes. Bibasilar subsegmental atelectasis. 2. No consolidation or edema.  03-04-19: ct of head:  No worsening since yesterday. Extra-axial hematoma in the  left lateral frontotemporal convexity shows maximal thickness of 9 mm today, measured at 10 mm yesterday. No evidence of additional bleeding. No mass effect. This could be either subdural or epidural, but is not enlarging or worsening.  03-08-19: chest x-ray: Increased patchy airspace opacities at both lung bases with possible small pleural effusions, consistent with aspiration.  03-15-19: pelvic x-ray: Nondisplaced RIGHT INFERIOR pubic ramus fracture.  04-01-19: ct of head:  There has been significant interval decrease in a lentiform collection about the left temporal horn and insula, measuring 5 mm in thickness and now low-attenuation, in keeping with resolving subacute to chronic subdural hematoma (series 2, image 15). Small-vessel white matter disease and global volume loss in keeping with patient age.  NO NEW EXAMS.   LABS REVIEWED PREVIOUS:   03-03-19: wbc 15.9; hgb 12.1; hct 37.7; mcv 92.2; plt 336; glucose 307; bun 26; creat 1.31 ;k+ 3.7; na++ 133; ca 8.6; liver normal albumin 2.9 03-04-19: wbc 11.9; hgb 10.8; hct 32.7; mcv 92.1; plt 323; glucose 411 bun 21; creat 1.39; k+ 4.4; na++ 132; ca 8.4; liver normal albumin 2.9 hgb a1c 7.8  03-07-19: hgb 11.3; hct 35.6; glucose 256; bun 17; creat 1.03 ;k+ 4.7; na++ 138; ca 8.4  03-19-19: wbc 6.7; hgb 9.6; hct 30.1; mcv 92.6; plt 239; glucose 281; bun 29; creat 1.19; k+ 4.1; na++ 137; ca 7.9; alk phos 148; albumin 2.2  03-23-19: glucose 247 bun 30; creat 1.09; k+ 3.7; na++ 137; ca 8.4  04-06-19: chol 144; ldl 78 trig 96; hdl 47 vit D 87.3  NO NEW LABS.   Review of Systems  Constitutional: Negative for malaise/fatigue.  Respiratory: Negative for cough and shortness of breath.   Cardiovascular: Negative for chest pain, palpitations and leg swelling.   Gastrointestinal: Negative for abdominal pain, constipation and heartburn.  Musculoskeletal: Negative for back pain, joint pain and myalgias.  Skin: Negative.   Neurological: Negative for dizziness.  Psychiatric/Behavioral: The patient is not nervous/anxious.     Physical Exam Constitutional:      General: He is not in acute distress.    Appearance: He is well-developed. He is not diaphoretic.     Comments: Thin   Eyes:     Comments: Legally blind   Neck:     Thyroid: No thyromegaly.  Cardiovascular:     Rate and Rhythm: Normal rate and regular rhythm.     Heart sounds: Normal heart sounds.  Pulmonary:     Effort: Pulmonary effort is normal. No respiratory distress.     Breath sounds: Normal breath sounds.  Abdominal:     General: Bowel sounds are normal. There is no distension.     Palpations: Abdomen is soft.     Tenderness: There is no abdominal tenderness.  Genitourinary:    Comments: Has foley  History of turp Musculoskeletal:     Right lower leg: No edema.     Left lower leg: No edema.     Comments:  Is able to move all extremities History of left ankle pinning History of ORIF right hip  03-15-19: right inferior pubic ramus fracture    Lymphadenopathy:     Cervical: No cervical adenopathy.  Skin:    General: Skin is warm and dry.  Neurological:     Mental Status: He is alert. Mental status is at baseline.  Psychiatric:        Mood and Affect: Mood normal.      ASSESSMENT/ PLAN:  TODAY:  1.  Type 1 diabetes mellitus  with stage 3 chronic kidney disease with long term current use of insulin: cbgs are elevated: hgb a1c 7.8 will continue humalog SSI 201-250: 3 units; 251-300: 4 units: 301-350: 5 units; 351-400: 6 units;  Will increase tresiba to 14 units nightly and will monitor his status. He is followed by endocrinology     MD is aware of resident's narcotic use and is in agreement with current plan of care. We will attempt to wean resident as apropriate    Ok Edwards NP [facility]  Contact [phone] Monday through Friday 8am- 5pm  After hours call [phone]

## 2019-04-19 ENCOUNTER — Encounter (HOSPITAL_COMMUNITY)
Admission: RE | Admit: 2019-04-19 | Discharge: 2019-04-19 | Disposition: A | Discharge status: Home / Self Care | Payer: No Typology Code available for payment source | Source: Skilled Nursing Facility | Attending: *Deleted | Admitting: *Deleted

## 2019-04-19 DIAGNOSIS — E785 Hyperlipidemia, unspecified: Secondary | ICD-10-CM | POA: Diagnosis not present

## 2019-04-19 DIAGNOSIS — N183 Chronic kidney disease, stage 3 (moderate): Secondary | ICD-10-CM | POA: Diagnosis not present

## 2019-04-19 DIAGNOSIS — S32591A Other specified fracture of right pubis, initial encounter for closed fracture: Secondary | ICD-10-CM | POA: Diagnosis not present

## 2019-04-19 DIAGNOSIS — B370 Candidal stomatitis: Secondary | ICD-10-CM | POA: Diagnosis not present

## 2019-04-19 DIAGNOSIS — I6200 Nontraumatic subdural hemorrhage, unspecified: Secondary | ICD-10-CM | POA: Diagnosis not present

## 2019-04-19 DIAGNOSIS — N390 Urinary tract infection, site not specified: Secondary | ICD-10-CM | POA: Diagnosis not present

## 2019-04-19 LAB — BASIC METABOLIC PANEL
Anion gap: 6 (ref 5–15)
BUN: 29 mg/dL — ABNORMAL HIGH (ref 8–23)
CO2: 31 mmol/L (ref 22–32)
Calcium: 8.7 mg/dL — ABNORMAL LOW (ref 8.9–10.3)
Chloride: 103 mmol/L (ref 98–111)
Creatinine, Ser: 0.87 mg/dL (ref 0.61–1.24)
GFR calc Af Amer: >60 mL/min (ref >60)
GFR calc non Af Amer: >60 mL/min (ref >60)
Glucose, Bld: 163 mg/dL — ABNORMAL HIGH (ref 70–99)
Potassium: 3.9 mmol/L (ref 3.5–5.1)
Sodium: 140 mmol/L (ref 135–145)

## 2019-04-27 ENCOUNTER — Non-Acute Institutional Stay (SKILLED_NURSING_FACILITY): Payer: Medicare Other | Admitting: Adult Health

## 2019-04-27 ENCOUNTER — Encounter: Payer: Self-pay | Admitting: Adult Health

## 2019-04-27 DIAGNOSIS — E1022 Type 1 diabetes mellitus with diabetic chronic kidney disease: Secondary | ICD-10-CM | POA: Diagnosis not present

## 2019-04-27 DIAGNOSIS — S065XAA Traumatic subdural hemorrhage with loss of consciousness status unknown, initial encounter: Secondary | ICD-10-CM

## 2019-04-27 DIAGNOSIS — N183 Chronic kidney disease, stage 3 (moderate): Secondary | ICD-10-CM | POA: Diagnosis not present

## 2019-04-27 DIAGNOSIS — S065X9A Traumatic subdural hemorrhage with loss of consciousness of unspecified duration, initial encounter: Secondary | ICD-10-CM

## 2019-04-27 NOTE — Progress Notes (Signed)
Location:   [place] Room Number: 133 P Place of Service:  SNF (31)   CODE STATUS: DNR  No Known Allergies  Chief Complaint  Patient presents with  . Acute Visit    Increased Lethargy    HPI:  Family has expressed concerns about increased lethargy. He tells me that he feels good. He denies any excessive sleepiness; no changes in energy. He is sleeping well at night. There are no reports of changes in appetite; no reports of fevers present.    Past Medical History:  Diagnosis Date  . Ataxia   . Chronic anticoagulation   . Diabetes mellitus   . DVT (deep venous thrombosis) (HCC)    recurrent  . Falls   . Gastroparesis   . Hypertension   . Legally blind    diabetic retinopathy  . Mild dementia ([place])   . Neuropathy   . Osteoporosis   . Pulmonary embolism ([place]) 12/2010   on blood thinners  . Stroke ([place])   . Weight loss     Past Surgical History:  Procedure Laterality Date  . ANKLE SURGERY     Patient states that he had pins place in the left foot  . BACK SURGERY  2008  . CARPAL TUNNEL RELEASE  08/2011   Left hand  . COLONOSCOPY  01/2011  . ORIF HIP FRACTURE  10/31/2011   Procedure: OPEN REDUCTION INTERNAL FIXATION HIP;  Surgeon: Arther Abbott, MD;  Location: [facility];  Service: Orthopedics;  Laterality: Right;  with Gamma Nail  . TRANSURETHRAL RESECTION OF PROSTATE  1984  . UPPER GASTROINTESTINAL ENDOSCOPY  01/2011    Social History   Socioeconomic History  . Marital status: Divorced    Spouse name: Not on file  . Number of children: Not on file  . Years of education: Not on file  . Highest education level: Not on file  Occupational History  . Not on file  Social Needs  . Financial resource strain: Not on file  . Food insecurity:    Worry: Not on file    Inability: Not on file  . Transportation needs:    Medical: Not on file    Non-medical: Not on file  Tobacco Use  . Smoking status: Former Smoker    Years: 20.00     Types: Cigars    Last attempt to quit: 10/27/1981    Years since quitting: 37.5  . Smokeless tobacco: Never Used  Substance and Sexual Activity  . Alcohol use: No    Alcohol/week: 0.0 standard drinks    Comment: Drinks Scotch 3 times per week prior to [employer]- hasn't drank x 1 month  . Drug use: No  . Sexual activity: Never  Lifestyle  . Physical activity:    Days per week: Not on file    Minutes per session: Not on file  . Stress: Not on file  Relationships  . Social connections:    Talks on phone: Not on file    Gets together: Not on file    Attends religious service: Not on file    Active member of club or organization: Not on file    Attends meetings of clubs or organizations: Not on file    Relationship status: Not on file  . Intimate partner violence:    Fear of current or ex partner: Not on file    Emotionally abused: Not on file    Physically abused: Not on file    Forced sexual  activity: Not on file  Other Topics Concern  . Not on file  Social History Narrative   Pt lives in 1 story home with his son and sons family   Has 3 adult children   High school graduate   Retired Regulatory affairs officer   Family History  Problem Relation Age of Onset  . Diabetes Mother   . Diabetes Father   . Diabetes Sister   . Diabetes Brother   . Diabetes Sister   . Diabetes Son   . Healthy Son   . Healthy Son       VITAL SIGNS BP (!) 142/74   Pulse 70   Temp 97.7 F (36.5 C)   Resp 20   Ht '5\' 7"'  (1.702 m)   Wt 121 lb 3.2 oz (55 kg)   BMI 18.98 kg/m   Outpatient Encounter Medications as of 04/27/2019  Medication Sig  . acetaminophen (TYLENOL) 500 MG tablet Take 1,000 mg by mouth every 8 (eight) hours.  Roseanne Kaufman [place]-Castor Oil (VENELEX) OINT Apply liberal amount to sacrum & bilateral buttocks qshift & prn incotinence for prevention  . calcium carbonate (TUMS E-X 750) 750 MG chewable tablet Chew 1 tablet by mouth 3 (three) times daily.  . citalopram (CELEXA) 20 MG tablet  Take 20 mg by mouth daily.   Marland Kitchen ezetimibe (ZETIA) 10 MG tablet Take 10 mg by mouth at bedtime.  . Glucerna (GLUCERNA) LIQD Take 237 mLs by mouth 2 (two) times daily between meals.  . insulin lispro (HUMALOG KWIKPEN) 100 UNIT/ML KwikPen Inject 0-0.06 mLs (0-6 Units total) into the skin 3 (three) times daily. CBG 151 - 200 (dose in units): 2 CBG 201 - 250 (dose in units): 3 CBG 251 - 300 (dose in units): 4 CBG 301 - 350 (dose in units): 5 CBG 351 - 400 (dose in units): 6  . latanoprost (XALATAN) 0.005 % ophthalmic solution Place 1 drop into both eyes at bedtime.   Marland Kitchen lisinopril (ZESTRIL) 2.5 MG tablet Take 2.5 mg by mouth daily.  . NON FORMULARY Diet Type:  Downgrade patient to dysphagia 3 chopped, continue thin liquids No Straws due to aspiration risk Consistent CHO snack at bedtime  . Ostomy Supplies (SKIN PREP WIPES) MISC Apply to bilateral heels every shift for preventions  . polyethylene glycol (MIRALAX / GLYCOLAX) 17 g packet Take 34 gm by mouth once a morning in 6 oz of liquid and take 17 gm with 4 oz of liquid once a day prn. May take additional dose once a day prn  . TRESIBA FLEXTOUCH 100 UNIT/ML SOPN FlexTouch Pen 14 Units at bedtime.   . vitamin B-12 (CYANOCOBALAMIN) 1000 MCG tablet Take 1,000 mcg by mouth every morning.   . [DISCONTINUED] Bismuth Tribromoph-Petrolatum (XEROFORM OIL EMULSION GAUZE EX) Clean back of left hand / BUE with N/S and apply xeroform gauze and wrap with kling.  Change daily and as needed until resoloved   No facility-administered encounter medications on file as of 04/27/2019.      SIGNIFICANT DIAGNOSTIC EXAMS  PREVIOUS  03-03-19: ct of head:  1. High attenuating extra-axial collection along the left temporal lobe which may represent a subdural or epidural hemorrhage. Close follow-up with CT recommended. 2. Age-related atrophy and chronic microvascular ischemic changes. 3. Paranasal sinus disease.  03-03-19: chest x-ray:  1. Cardiomegaly with low lung  volumes. Bibasilar subsegmental atelectasis. 2. No consolidation or edema.  03-04-19: ct of head:  No worsening since yesterday. Extra-axial hematoma in the left lateral frontotemporal convexity  shows maximal thickness of 9 mm today, measured at 10 mm yesterday. No evidence of additional bleeding. No mass effect. This could be either subdural or epidural, but is not enlarging or worsening.  03-08-19: chest x-ray: Increased patchy airspace opacities at both lung bases with possible small pleural effusions, consistent with aspiration.  03-15-19: pelvic x-ray: Nondisplaced RIGHT INFERIOR pubic ramus fracture.  04-01-19: ct of head:  There has been significant interval decrease in a lentiform collection about the left temporal horn and insula, measuring 5 mm in thickness and now low-attenuation, in keeping with resolving subacute to chronic subdural hematoma (series 2, image 15). Small-vessel white matter disease and global volume loss in keeping with patient age.  NO NEW EXAMS.   LABS REVIEWED PREVIOUS:   03-03-19: wbc 15.9; hgb 12.1; hct 37.7; mcv 92.2; plt 336; glucose 307; bun 26; creat 1.31 ;k+ 3.7; na++ 133; ca 8.6; liver normal albumin 2.9 03-04-19: wbc 11.9; hgb 10.8; hct 32.7; mcv 92.1; plt 323; glucose 411 bun 21; creat 1.39; k+ 4.4; na++ 132; ca 8.4; liver normal albumin 2.9 hgb a1c 7.8  03-07-19: hgb 11.3; hct 35.6; glucose 256; bun 17; creat 1.03 ;k+ 4.7; na++ 138; ca 8.4  03-19-19: wbc 6.7; hgb 9.6; hct 30.1; mcv 92.6; plt 239; glucose 281; bun 29; creat 1.19; k+ 4.1; na++ 137; ca 7.9; alk phos 148; albumin 2.2  03-23-19: glucose 247 bun 30; creat 1.09; k+ 3.7; na++ 137; ca 8.4  04-06-19: chol 144; ldl 78 trig 96; hdl 47 vit D 87.3  TODAY:   04-19-19: glucose 163; bun 29; creat 0.87  k+ 3.9; na++ 140 na++ 8.7   Review of Systems  Constitutional: Negative for malaise/fatigue.  Respiratory: Negative for cough and shortness of breath.   Cardiovascular: Negative for chest pain, palpitations  and leg swelling.  Gastrointestinal: Negative for abdominal pain, constipation and heartburn.  Musculoskeletal: Negative for back pain, joint pain and myalgias.  Skin: Negative.   Neurological: Negative for dizziness and weakness.  Psychiatric/Behavioral: The patient is not nervous/anxious.     Physical Exam Constitutional:      General: He is not in acute distress.    Appearance: He is well-developed. He is not diaphoretic.     Comments: thin  Eyes:     Comments: Legally blind   Neck:     Thyroid: No thyromegaly.  Cardiovascular:     Rate and Rhythm: Normal rate and regular rhythm.     Heart sounds: Normal heart sounds.  Pulmonary:     Effort: Pulmonary effort is normal. No respiratory distress.     Breath sounds: Normal breath sounds.  Abdominal:     General: Bowel sounds are normal. There is no distension.     Palpations: Abdomen is soft.     Tenderness: There is no abdominal tenderness.  Genitourinary:    Comments: Has foley  History of turp Musculoskeletal:     Right lower leg: No edema.     Left lower leg: No edema.     Comments: Is able to move all extremities History of left ankle pinning History of ORIF right hip  03-15-19: right inferior pubic ramus fracture     Lymphadenopathy:     Cervical: No cervical adenopathy.  Skin:    General: Skin is warm and dry.  Neurological:     Mental Status: He is alert. Mental status is at baseline.  Psychiatric:        Mood and Affect: Mood normal.     ASSESSMENT/  PLAN:  TODAY:  1.  Type 1 diabetes mellitus with stage 3 chronic kidney disease with long term current use of insulin: cbgs are elevated: hgb a1c 7.8 will continue tresiba 14 units nightly  will change to  humalog SSI 201-250: 4 units; 251-300: 5 units: 301-350: 6 units; 351-400: 7 units;  Will monitor his status.   2. Subdural hematoma: is neurologically stable; will not make changes will monitor his status.   MD is aware of resident's narcotic use and is in  agreement with current plan of care. We will attempt to wean resident as apropriate   Ok Edwards NP [facility]  Contact [phone] Monday through Friday 8am- 5pm  After hours call [phone]

## 2019-04-28 ENCOUNTER — Other Ambulatory Visit: Payer: Self-pay | Admitting: Adult Health

## 2019-04-28 MED ORDER — INSULIN LISPRO (1 UNIT DIAL) 100 UNIT/ML (KWIKPEN): 0.0000 [IU] | PEN_INJECTOR | Freq: Three times a day (TID) | SUBCUTANEOUS | 0 refills | Status: AC

## 2019-04-29 ENCOUNTER — Encounter: Payer: Self-pay | Admitting: Adult Health

## 2019-04-29 ENCOUNTER — Non-Acute Institutional Stay (SKILLED_NURSING_FACILITY): Payer: Medicare Other | Admitting: Adult Health

## 2019-04-29 DIAGNOSIS — E1022 Type 1 diabetes mellitus with diabetic chronic kidney disease: Secondary | ICD-10-CM

## 2019-04-29 DIAGNOSIS — N183 Chronic kidney disease, stage 3 unspecified: Secondary | ICD-10-CM

## 2019-04-29 NOTE — Progress Notes (Signed)
Location:   [place] Room Number: 133 P Place of Service:  SNF (31)   CODE STATUS: DNR  No Known Allergies  Chief Complaint  Patient presents with  . Acute Visit    Hypertension    HPI:  He had been started on lisinopril 2.5 mg daily due to his history of diabetes and stage 3 ckd. His blood pressure is low. There are no reports of changes in appetite; no reports of fever. He is lethargic today.    Past Medical History:  Diagnosis Date  . Ataxia   . Chronic anticoagulation   . Diabetes mellitus   . DVT (deep venous thrombosis) (HCC)    recurrent  . Falls   . Gastroparesis   . Hypertension   . Legally blind    diabetic retinopathy  . Mild dementia ([place])   . Neuropathy   . Osteoporosis   . Pulmonary embolism ([place]) 12/2010   on blood thinners  . Stroke ([place])   . Weight loss     Past Surgical History:  Procedure Laterality Date  . ANKLE SURGERY     Patient states that he had pins place in the left foot  . BACK SURGERY  2008  . CARPAL TUNNEL RELEASE  08/2011   Left hand  . COLONOSCOPY  01/2011  . ORIF HIP FRACTURE  10/31/2011   Procedure: OPEN REDUCTION INTERNAL FIXATION HIP;  Surgeon: Arther Abbott, MD;  Location: [facility];  Service: Orthopedics;  Laterality: Right;  with Gamma Nail  . TRANSURETHRAL RESECTION OF PROSTATE  1984  . UPPER GASTROINTESTINAL ENDOSCOPY  01/2011    Social History   Socioeconomic History  . Marital status: Divorced    Spouse name: Not on file  . Number of children: Not on file  . Years of education: Not on file  . Highest education level: Not on file  Occupational History  . Not on file  Social Needs  . Financial resource strain: Not on file  . Food insecurity:    Worry: Not on file    Inability: Not on file  . Transportation needs:    Medical: Not on file    Non-medical: Not on file  Tobacco Use  . Smoking status: Former Smoker    Years: 20.00    Types: Cigars    Last attempt to quit:  10/27/1981    Years since quitting: 37.5  . Smokeless tobacco: Never Used  Substance and Sexual Activity  . Alcohol use: No    Alcohol/week: 0.0 standard drinks    Comment: Drinks Scotch 3 times per week prior to [employer]- hasn't drank x 1 month  . Drug use: No  . Sexual activity: Never  Lifestyle  . Physical activity:    Days per week: Not on file    Minutes per session: Not on file  . Stress: Not on file  Relationships  . Social connections:    Talks on phone: Not on file    Gets together: Not on file    Attends religious service: Not on file    Active member of club or organization: Not on file    Attends meetings of clubs or organizations: Not on file    Relationship status: Not on file  . Intimate partner violence:    Fear of current or ex partner: Not on file    Emotionally abused: Not on file    Physically abused: Not on file    Forced sexual activity: Not on  file  Other Topics Concern  . Not on file  Social History Narrative   Pt lives in 1 story home with his son and sons family   Has 3 adult children   High school graduate   Retired Regulatory affairs officer   Family History  Problem Relation Age of Onset  . Diabetes Mother   . Diabetes Father   . Diabetes Sister   . Diabetes Brother   . Diabetes Sister   . Diabetes Son   . Healthy Son   . Healthy Son       VITAL SIGNS BP (!) 150/67   Pulse 87   Temp 97.8 F (36.6 C)   Resp 20   Ht '5\' 7"'  (1.702 m)   Wt 121 lb 3.2 oz (55 kg)   BMI 18.98 kg/m   Outpatient Encounter Medications as of 04/29/2019  Medication Sig  . acetaminophen (TYLENOL) 500 MG tablet Take 1,000 mg by mouth every 8 (eight) hours.  Roseanne Kaufman Peru-Castor Oil (VENELEX) OINT Apply liberal amount to sacrum & bilateral buttocks qshift & prn incotinence for prevention  . calcium carbonate (TUMS E-X 750) 750 MG chewable tablet Chew 1 tablet by mouth 3 (three) times daily.  . citalopram (CELEXA) 20 MG tablet Take 20 mg by mouth daily.   Marland Kitchen ezetimibe  (ZETIA) 10 MG tablet Take 10 mg by mouth at bedtime.  . Glucerna (GLUCERNA) LIQD Take 237 mLs by mouth 2 (two) times daily between meals.  . insulin lispro (HUMALOG KWIKPEN) 100 UNIT/ML KwikPen Inject 0-0.07 mLs (0-7 Units total) into the skin 3 (three) times daily. CBG 151 - 200 (dose in units): 3 CBG 201 - 250 (dose in units): 4 CBG 251 - 300 (dose in units): 5 CBG 301 - 350 (dose in units): 6 CBG 351 - 400 (dose in units): 7  . latanoprost (XALATAN) 0.005 % ophthalmic solution Place 1 drop into both eyes at bedtime.   . NON FORMULARY Diet Type:  Downgrade patient to dysphagia 3 chopped, continue thin liquids No Straws due to aspiration risk Consistent CHO snack at bedtime  . Ostomy Supplies (SKIN PREP WIPES) MISC Apply to bilateral heels every shift for preventions  . polyethylene glycol (MIRALAX / GLYCOLAX) 17 g packet Take 34 gm by mouth once a morning in 6 oz of liquid and take 17 gm with 4 oz of liquid once a day prn. May take additional dose once a day prn  . TRESIBA FLEXTOUCH 100 UNIT/ML SOPN FlexTouch Pen 14 Units at bedtime.   . vitamin B-12 (CYANOCOBALAMIN) 1000 MCG tablet Take 1,000 mcg by mouth every morning.   Marland Kitchen lisinopril (ZESTRIL) 2.5 MG tablet Take 2.5 mg by mouth daily.   No facility-administered encounter medications on file as of 04/29/2019.      SIGNIFICANT DIAGNOSTIC EXAMS  PREVIOUS  03-03-19: ct of head:  1. High attenuating extra-axial collection along the left temporal lobe which may represent a subdural or epidural hemorrhage. Close follow-up with CT recommended. 2. Age-related atrophy and chronic microvascular ischemic changes. 3. Paranasal sinus disease.  03-03-19: chest x-ray:  1. Cardiomegaly with low lung volumes. Bibasilar subsegmental atelectasis. 2. No consolidation or edema.  03-04-19: ct of head:  No worsening since yesterday. Extra-axial hematoma in the left lateral frontotemporal convexity shows maximal thickness of 9 mm today, measured at 10 mm  yesterday. No evidence of additional bleeding. No mass effect. This could be either subdural or epidural, but is not enlarging or worsening.  03-08-19: chest x-ray: Increased  patchy airspace opacities at both lung bases with possible small pleural effusions, consistent with aspiration.  03-15-19: pelvic x-ray: Nondisplaced RIGHT INFERIOR pubic ramus fracture.  04-01-19: ct of head:  There has been significant interval decrease in a lentiform collection about the left temporal horn and insula, measuring 5 mm in thickness and now low-attenuation, in keeping with resolving subacute to chronic subdural hematoma (series 2, image 15). Small-vessel white matter disease and global volume loss in keeping with patient age.  NO NEW EXAMS.   LABS REVIEWED PREVIOUS:   03-03-19: wbc 15.9; hgb 12.1; hct 37.7; mcv 92.2; plt 336; glucose 307; bun 26; creat 1.31 ;k+ 3.7; na++ 133; ca 8.6; liver normal albumin 2.9 03-04-19: wbc 11.9; hgb 10.8; hct 32.7; mcv 92.1; plt 323; glucose 411 bun 21; creat 1.39; k+ 4.4; na++ 132; ca 8.4; liver normal albumin 2.9 hgb a1c 7.8  03-07-19: hgb 11.3; hct 35.6; glucose 256; bun 17; creat 1.03 ;k+ 4.7; na++ 138; ca 8.4  03-19-19: wbc 6.7; hgb 9.6; hct 30.1; mcv 92.6; plt 239; glucose 281; bun 29; creat 1.19; k+ 4.1; na++ 137; ca 7.9; alk phos 148; albumin 2.2  03-23-19: glucose 247 bun 30; creat 1.09; k+ 3.7; na++ 137; ca 8.4  04-06-19: chol 144; ldl 78 trig 96; hdl 47 vit D 87.3 04-19-19: glucose 163; bun 29; creat 0.87  k+ 3.9; na++ 140 na++ 8.7   NO NEW LABS.   Review of Systems  Reason unable to perform ROS: lethargic     Physical Exam Constitutional:      General: He is not in acute distress.    Appearance: He is underweight. He is not diaphoretic.  Eyes:     Comments: Legally blind   Neck:     Musculoskeletal: Neck supple.     Thyroid: No thyromegaly.  Cardiovascular:     Rate and Rhythm: Normal rate and regular rhythm.     Pulses: Normal pulses.     Heart sounds:  Normal heart sounds.  Pulmonary:     Effort: Pulmonary effort is normal. No respiratory distress.     Breath sounds: Normal breath sounds.  Abdominal:     General: Bowel sounds are normal. There is no distension.     Palpations: Abdomen is soft.     Tenderness: There is no abdominal tenderness.  Genitourinary:    Comments: Has foley  History of turp Musculoskeletal:     Right lower leg: No edema.     Left lower leg: No edema.     Comments: Is able to move all extremities History of left ankle pinning History of ORIF right hip  03-15-19: right inferior pubic ramus fracture      Lymphadenopathy:     Cervical: No cervical adenopathy.  Skin:    General: Skin is warm and dry.  Neurological:     Mental Status: He is lethargic.     ASSESSMENT/ PLAN:  TODAY:  1.  Type 1 diabetes mellitus with stage 3 chronic kidney disease with long term current use of insulin: will stop lisinopril at this time and will continue to monitor his status.    MD is aware of resident's narcotic use and is in agreement with current plan of care. We will attempt to wean resident as apropriate   Ok Edwards NP [facility]  Contact [phone] Monday through Friday 8am- 5pm  After hours call [phone]

## 2019-04-30 ENCOUNTER — Encounter (HOSPITAL_COMMUNITY)
Admission: RE | Admit: 2019-04-30 | Discharge: 2019-04-30 | Disposition: A | Discharge status: Home / Self Care | Payer: No Typology Code available for payment source | Source: Skilled Nursing Facility | Attending: *Deleted | Admitting: *Deleted

## 2019-04-30 ENCOUNTER — Non-Acute Institutional Stay (SKILLED_NURSING_FACILITY): Payer: Medicare Other | Admitting: Adult Health

## 2019-04-30 ENCOUNTER — Inpatient Hospital Stay (HOSPITAL_COMMUNITY): Payer: Medicare Other | Attending: Internal Medicine

## 2019-04-30 ENCOUNTER — Ambulatory Visit (HOSPITAL_COMMUNITY)
Admission: RE | Admit: 2019-04-30 | Discharge: 2019-04-30 | Disposition: A | Discharge status: Home / Self Care | Payer: Medicare Other | Source: Skilled Nursing Facility | Attending: Internal Medicine | Admitting: Internal Medicine

## 2019-04-30 ENCOUNTER — Other Ambulatory Visit (HOSPITAL_COMMUNITY)
Admission: RE | Admit: 2019-04-30 | Discharge: 2019-04-30 | Disposition: A | Discharge status: Home / Self Care | Payer: Medicare Other | Source: Skilled Nursing Facility | Attending: Adult Health | Admitting: Adult Health

## 2019-04-30 ENCOUNTER — Encounter: Payer: Self-pay | Admitting: Adult Health

## 2019-04-30 DIAGNOSIS — E10311 Type 1 diabetes mellitus with unspecified diabetic retinopathy with macular edema: Secondary | ICD-10-CM | POA: Diagnosis not present

## 2019-04-30 DIAGNOSIS — R918 Other nonspecific abnormal finding of lung field: Secondary | ICD-10-CM | POA: Diagnosis not present

## 2019-04-30 DIAGNOSIS — N183 Chronic kidney disease, stage 3 (moderate): Secondary | ICD-10-CM | POA: Diagnosis not present

## 2019-04-30 DIAGNOSIS — R0989 Other specified symptoms and signs involving the circulatory and respiratory systems: Secondary | ICD-10-CM | POA: Diagnosis not present

## 2019-04-30 DIAGNOSIS — R05 Cough: Secondary | ICD-10-CM | POA: Insufficient documentation

## 2019-04-30 DIAGNOSIS — N179 Acute kidney failure, unspecified: Secondary | ICD-10-CM | POA: Diagnosis not present

## 2019-04-30 DIAGNOSIS — J189 Pneumonia, unspecified organism: Secondary | ICD-10-CM

## 2019-04-30 DIAGNOSIS — Z452 Encounter for adjustment and management of vascular access device: Secondary | ICD-10-CM | POA: Diagnosis not present

## 2019-04-30 LAB — COMPREHENSIVE METABOLIC PANEL
ALT: 32 U/L (ref 0–44)
AST: 43 U/L — ABNORMAL HIGH (ref 15–41)
Albumin: 2.7 g/dL — ABNORMAL LOW (ref 3.5–5.0)
Alkaline Phosphatase: 134 U/L — ABNORMAL HIGH (ref 38–126)
Anion gap: 12 (ref 5–15)
BUN: 56 mg/dL — ABNORMAL HIGH (ref 8–23)
CO2: 27 mmol/L (ref 22–32)
Calcium: 8.9 mg/dL (ref 8.9–10.3)
Chloride: 101 mmol/L (ref 98–111)
Creatinine, Ser: 1.53 mg/dL — ABNORMAL HIGH (ref 0.61–1.24)
GFR calc Af Amer: 45 mL/min — ABNORMAL LOW (ref >60)
GFR calc non Af Amer: 39 mL/min — ABNORMAL LOW (ref >60)
Glucose, Bld: 153 mg/dL — ABNORMAL HIGH (ref 70–99)
Potassium: 4 mmol/L (ref 3.5–5.1)
Sodium: 140 mmol/L (ref 135–145)
Total Bilirubin: 0.7 mg/dL (ref 0.3–1.2)
Total Protein: 6.2 g/dL — ABNORMAL LOW (ref 6.5–8.1)

## 2019-04-30 LAB — CBC WITH DIFFERENTIAL/PLATELET
Abs Immature Granulocytes: 0.06 10*3/uL (ref 0.00–0.07)
Basophils Absolute: 0 10*3/uL (ref 0.0–0.1)
Basophils Relative: 0 %
Eosinophils Absolute: 0 10*3/uL (ref 0.0–0.5)
Eosinophils Relative: 0 %
HCT: 33.5 % — ABNORMAL LOW (ref 39.0–52.0)
Hemoglobin: 10.7 g/dL — ABNORMAL LOW (ref 13.0–17.0)
Immature Granulocytes: 0 %
Lymphocytes Relative: 4 %
Lymphs Abs: 0.7 10*3/uL (ref 0.7–4.0)
MCH: 29.6 pg (ref 26.0–34.0)
MCHC: 31.9 g/dL (ref 30.0–36.0)
MCV: 92.8 fL (ref 80.0–100.0)
Monocytes Absolute: 0.9 10*3/uL (ref 0.1–1.0)
Monocytes Relative: 5 %
Neutro Abs: 15.4 10*3/uL — ABNORMAL HIGH (ref 1.7–7.7)
Neutrophils Relative %: 91 %
Platelets: 306 10*3/uL (ref 150–400)
RBC: 3.61 MIL/uL — ABNORMAL LOW (ref 4.22–5.81)
RDW: 15.1 % (ref 11.5–15.5)
WBC: 17.1 10*3/uL — ABNORMAL HIGH (ref 4.0–10.5)
nRBC: 0 % (ref 0.0–0.2)

## 2019-04-30 NOTE — Progress Notes (Signed)
Location:   [place] Room Number: 133 P Place of Service:  SNF (31)   CODE STATUS: DNR  Allergies  Allergen Reactions  . Lisinopril     Chief Complaint  Patient presents with  . Acute Visit    Change in Status    HPI:  He has had a change in his status. He is not responsive; his wbc is 17; he has crackles and rhonchi present. There are no reports of fevers present his creat clearance is 29.88. he is not eating or drinking.   Past Medical History:  Diagnosis Date  . Ataxia   . Chronic anticoagulation   . Diabetes mellitus   . DVT (deep venous thrombosis) (HCC)    recurrent  . Falls   . Gastroparesis   . Hypertension   . Legally blind    diabetic retinopathy  . Mild dementia ([place])   . Neuropathy   . Osteoporosis   . Pulmonary embolism ([place]) 12/2010   on blood thinners  . Stroke ([place])   . Weight loss     Past Surgical History:  Procedure Laterality Date  . ANKLE SURGERY     Patient states that he had pins place in the left foot  . BACK SURGERY  2008  . CARPAL TUNNEL RELEASE  08/2011   Left hand  . COLONOSCOPY  01/2011  . ORIF HIP FRACTURE  10/31/2011   Procedure: OPEN REDUCTION INTERNAL FIXATION HIP;  Surgeon: Arther Abbott, MD;  Location: [facility];  Service: Orthopedics;  Laterality: Right;  with Gamma Nail  . TRANSURETHRAL RESECTION OF PROSTATE  1984  . UPPER GASTROINTESTINAL ENDOSCOPY  01/2011    Social History   Socioeconomic History  . Marital status: Divorced    Spouse name: Not on file  . Number of children: Not on file  . Years of education: Not on file  . Highest education level: Not on file  Occupational History  . Not on file  Social Needs  . Financial resource strain: Not on file  . Food insecurity:    Worry: Not on file    Inability: Not on file  . Transportation needs:    Medical: Not on file    Non-medical: Not on file  Tobacco Use  . Smoking status: Former Smoker    Years: 20.00    Types:  Cigars    Last attempt to quit: 10/27/1981    Years since quitting: 37.5  . Smokeless tobacco: Never Used  Substance and Sexual Activity  . Alcohol use: No    Alcohol/week: 0.0 standard drinks    Comment: Drinks Scotch 3 times per week prior to [employer]- hasn't drank x 1 month  . Drug use: No  . Sexual activity: Never  Lifestyle  . Physical activity:    Days per week: Not on file    Minutes per session: Not on file  . Stress: Not on file  Relationships  . Social connections:    Talks on phone: Not on file    Gets together: Not on file    Attends religious service: Not on file    Active member of club or organization: Not on file    Attends meetings of clubs or organizations: Not on file    Relationship status: Not on file  . Intimate partner violence:    Fear of current or ex partner: Not on file    Emotionally abused: Not on file    Physically abused: Not on file  Forced sexual activity: Not on file  Other Topics Concern  . Not on file  Social History Narrative   Pt lives in 1 story home with his son and sons family   Has 3 adult children   High school graduate   Retired Regulatory affairs officer   Family History  Problem Relation Age of Onset  . Diabetes Mother   . Diabetes Father   . Diabetes Sister   . Diabetes Brother   . Diabetes Sister   . Diabetes Son   . Healthy Son   . Healthy Son       VITAL SIGNS BP (!) 100/56   Pulse 77   Temp 98 F (36.7 C)   Resp 20   Ht _0  (1.702 m)   Wt 121 lb 3.2 oz (55 kg)   BMI 18.98 kg/m   Outpatient Encounter Medications as of 04/30/2019  Medication Sig  . acetaminophen (TYLENOL) 500 MG tablet Take 1,000 mg by mouth every 8 (eight) hours.  Roseanne Kaufman [place]-Castor Oil (VENELEX) OINT Apply liberal amount to sacrum & bilateral buttocks qshift & prn incotinence for prevention  . calcium carbonate (TUMS E-X 750) 750 MG chewable tablet Chew 1 tablet by mouth 3 (three) times daily.  . citalopram (CELEXA) 20 MG tablet Take 20 mg  by mouth daily.   Marland Kitchen ezetimibe (ZETIA) 10 MG tablet Take 10 mg by mouth at bedtime.  . Glucerna (GLUCERNA) LIQD Take 237 mLs by mouth 2 (two) times daily between meals.  . insulin lispro (HUMALOG KWIKPEN) 100 UNIT/ML KwikPen Inject 0-0.07 mLs (0-7 Units total) into the skin 3 (three) times daily. CBG 151 - 200 (dose in units): 3 CBG 201 - 250 (dose in units): 4 CBG 251 - 300 (dose in units): 5 CBG 301 - 350 (dose in units): 6 CBG 351 - 400 (dose in units): 7  . latanoprost (XALATAN) 0.005 % ophthalmic solution Place 1 drop into both eyes at bedtime.   . NON FORMULARY Diet Type:  Downgrade patient to dysphagia 3 chopped, continue thin liquids No Straws due to aspiration risk Consistent CHO snack at bedtime  . Ostomy Supplies (SKIN PREP WIPES) MISC Apply to bilateral heels every shift for preventions  . polyethylene glycol (MIRALAX / GLYCOLAX) 17 g packet Take 34 gm by mouth once a morning in 6 oz of liquid and take 17 gm with 4 oz of liquid once a day prn. May take additional dose once a day prn  . TRESIBA FLEXTOUCH 100 UNIT/ML SOPN FlexTouch Pen 14 Units at bedtime.   . vitamin B-12 (CYANOCOBALAMIN) 1000 MCG tablet Take 1,000 mcg by mouth every morning.   . [DISCONTINUED] lisinopril (ZESTRIL) 2.5 MG tablet Take 2.5 mg by mouth daily.   No facility-administered encounter medications on file as of 04/30/2019.      SIGNIFICANT DIAGNOSTIC EXAMS   PREVIOUS  03-03-19: ct of head:  1. High attenuating extra-axial collection along the left temporal lobe which may represent a subdural or epidural hemorrhage. Close follow-up with CT recommended. 2. Age-related atrophy and chronic microvascular ischemic changes. 3. Paranasal sinus disease.  03-03-19: chest x-ray:  1. Cardiomegaly with low lung volumes. Bibasilar subsegmental atelectasis. 2. No consolidation or edema.  03-04-19: ct of head:  No worsening since yesterday. Extra-axial hematoma in the left lateral frontotemporal convexity shows maximal  thickness of 9 mm today, measured at 10 mm yesterday. No evidence of additional bleeding. No mass effect. This could be either subdural or epidural, but is not enlarging  or worsening.  03-08-19: chest x-ray: Increased patchy airspace opacities at both lung bases with possible small pleural effusions, consistent with aspiration.  03-15-19: pelvic x-ray: Nondisplaced RIGHT INFERIOR pubic ramus fracture.  04-01-19: ct of head:  There has been significant interval decrease in a lentiform collection about the left temporal horn and insula, measuring 5 mm in thickness and now low-attenuation, in keeping with resolving subacute to chronic subdural hematoma (series 2, image 15). Small-vessel white matter disease and global volume loss in keeping with patient age.  TODAY.   04-30-19: chest x-ray: Low lung volumes with mild bibasilar atelectasis/infiltrates. Some improvement in aeration from prior exam.  LABS REVIEWED PREVIOUS:   03-03-19: wbc 15.9; hgb 12.1; hct 37.7; mcv 92.2; plt 336; glucose 307; bun 26; creat 1.31 ;k+ 3.7; na++ 133; ca 8.6; liver normal albumin 2.9 03-04-19: wbc 11.9; hgb 10.8; hct 32.7; mcv 92.1; plt 323; glucose 411 bun 21; creat 1.39; k+ 4.4; na++ 132; ca 8.4; liver normal albumin 2.9 hgb a1c 7.8  03-07-19: hgb 11.3; hct 35.6; glucose 256; bun 17; creat 1.03 ;k+ 4.7; na++ 138; ca 8.4  03-19-19: wbc 6.7; hgb 9.6; hct 30.1; mcv 92.6; plt 239; glucose 281; bun 29; creat 1.19; k+ 4.1; na++ 137; ca 7.9; alk phos 148; albumin 2.2  03-23-19: glucose 247 bun 30; creat 1.09; k+ 3.7; na++ 137; ca 8.4  04-06-19: chol 144; ldl 78 trig 96; hdl 47 vit D 87.3 04-19-19: glucose 163; bun 29; creat 0.87  k+ 3.9; na++ 140 na++ 8.7   TODAY:   04-30-19'; wbc 17.1; hgb 10.7; hct 33.5 mcv 92.8; plt 306; glucose 153; bun 56; creat 1.53; k+ 4.0; na++ 140; liver normal albumin 2.7   Review of Systems  Unable to perform ROS: Patient unresponsive    Physical Exam Constitutional:      General: He is not in  acute distress.    Appearance: He is underweight. He is not diaphoretic.  Eyes:     Comments: Legally blind  Neck:     Musculoskeletal: Neck supple.     Thyroid: No thyromegaly.  Cardiovascular:     Rate and Rhythm: Normal rate and regular rhythm.     Pulses: Normal pulses.     Heart sounds: Normal heart sounds.  Pulmonary:     Effort: Pulmonary effort is normal. No respiratory distress.     Breath sounds: Normal breath sounds.  Abdominal:     General: Bowel sounds are normal. There is no distension.     Palpations: Abdomen is soft.     Tenderness: There is no abdominal tenderness.  Genitourinary:    Comments: Has foley  History of turp Musculoskeletal:     Right lower leg: No edema.     Left lower leg: No edema.     Comments: History of left ankle pinning History of ORIF right hip  03-15-19: right inferior pubic ramus fracture     Lymphadenopathy:     Cervical: No cervical adenopathy.  Skin:    General: Skin is warm and dry.  Neurological:     Comments: Unresponsive      ASSESSMENT/ PLAN:  TODAY:   1. HCAP 2. Acute renal failure superimposed on stage 3 chronic kidney disease unspecified acute renal failure type.   Will insert picc line  Will begin 1/2 NS at 75 cc per hour for 2 liters Will begin rocephin 750 mg IV every 48 hours through 05-16-19  Will hold demadex Will continue to monitor his status.  MD is aware of resident's narcotic use and is in agreement with current plan of care. We will attempt to wean resident as apropriate   Deborah Green NP [facility]  Contact [phone] Monday through Friday 8am- 5pm  After hours call [phone]  

## 2019-05-01 ENCOUNTER — Other Ambulatory Visit: Payer: Self-pay | Admitting: Internal Medicine

## 2019-05-01 ENCOUNTER — Telehealth: Payer: Self-pay | Admitting: Internal Medicine

## 2019-05-01 LAB — URINALYSIS, ROUTINE W REFLEX MICROSCOPIC
Bilirubin Urine: NEGATIVE
Glucose, UA: NEGATIVE mg/dL
Ketones, ur: NEGATIVE mg/dL
Nitrite: NEGATIVE
Protein, ur: NEGATIVE mg/dL
Specific Gravity, Urine: 1.025 (ref 1.005–1.030)
pH: 5 (ref 5.0–8.0)

## 2019-05-01 LAB — URINALYSIS, MICROSCOPIC (REFLEX): RBC / HPF: >50 RBC/hpf (ref 0–5)

## 2019-05-01 MED ORDER — MORPHINE SULFATE (CONCENTRATE) 20 MG/ML PO SOLN: 5.0000 mg | ORAL | 0 refills | Status: AC | PRN

## 2019-05-01 NOTE — Telephone Encounter (Signed)
Talked to the Nurse In charge of Eduardo Lee in [facility]. He is not doing well. Family and POA does not want him transferred to hospital and want him to stay here for Comfort care. Will start him on Roxanol. Discontinue  most of his Meds.

## 2019-05-02 LAB — BLOOD CULTURE ID PANEL (REFLEXED)

## 2019-05-02 LAB — URINE CULTURE: Culture: <10000 — AB

## 2019-05-03 ENCOUNTER — Ambulatory Visit: Payer: Self-pay | Admitting: Orthopedic Surgery

## 2019-05-03 DIAGNOSIS — N183 Chronic kidney disease, stage 3 unspecified: Secondary | ICD-10-CM | POA: Insufficient documentation

## 2019-05-03 DIAGNOSIS — N179 Acute kidney failure, unspecified: Secondary | ICD-10-CM | POA: Insufficient documentation

## 2019-05-03 DEATH — deceased

## 2019-05-04 LAB — CULTURE, BLOOD (ROUTINE X 2)

## 2019-05-05 LAB — CULTURE, BLOOD (ROUTINE X 2)
Culture: NO GROWTH
Culture: NO GROWTH
Culture: NO GROWTH
Special Requests: ADEQUATE
